# Patient Record
Sex: Female | Born: 1955 | Race: White | Hispanic: No | Marital: Married | Smoking: Never smoker
Health system: Southern US, Community
[De-identification: ages and names within clinical notes are randomized; demographics above are authoritative.]

## PROBLEM LIST (undated history)

## (undated) DIAGNOSIS — I509 Heart failure, unspecified: Secondary | ICD-10-CM

## (undated) DIAGNOSIS — M419 Scoliosis, unspecified: Secondary | ICD-10-CM

## (undated) DIAGNOSIS — F329 Major depressive disorder, single episode, unspecified: Secondary | ICD-10-CM

## (undated) DIAGNOSIS — Z9889 Other specified postprocedural states: Secondary | ICD-10-CM

## (undated) DIAGNOSIS — I10 Essential (primary) hypertension: Secondary | ICD-10-CM

## (undated) DIAGNOSIS — R011 Cardiac murmur, unspecified: Secondary | ICD-10-CM

## (undated) DIAGNOSIS — R112 Nausea with vomiting, unspecified: Secondary | ICD-10-CM

## (undated) DIAGNOSIS — F32A Depression, unspecified: Secondary | ICD-10-CM

## (undated) DIAGNOSIS — K219 Gastro-esophageal reflux disease without esophagitis: Secondary | ICD-10-CM

## (undated) DIAGNOSIS — M1990 Unspecified osteoarthritis, unspecified site: Secondary | ICD-10-CM

## (undated) HISTORY — DX: Major depressive disorder, single episode, unspecified: F32.9

## (undated) HISTORY — DX: Essential (primary) hypertension: I10

## (undated) HISTORY — PX: REVISION TOTAL HIP ARTHROPLASTY: SHX766

## (undated) HISTORY — PX: ABDOMINAL HYSTERECTOMY: SHX81

## (undated) HISTORY — DX: Gastro-esophageal reflux disease without esophagitis: K21.9

## (undated) HISTORY — DX: Depression, unspecified: F32.A

## (undated) HISTORY — PX: BREAST CYST ASPIRATION: SHX578

## (undated) HISTORY — PX: BREAST EXCISIONAL BIOPSY: SUR124

## (undated) HISTORY — PX: HIP FRACTURE SURGERY: SHX118

---

## 2000-10-13 HISTORY — PX: BACK SURGERY: SHX140

## 2005-01-20 ENCOUNTER — Ambulatory Visit: Payer: Self-pay | Admitting: Family Medicine

## 2005-12-26 ENCOUNTER — Ambulatory Visit: Payer: Self-pay | Admitting: Gastroenterology

## 2006-02-06 ENCOUNTER — Other Ambulatory Visit: Payer: Self-pay

## 2006-02-23 ENCOUNTER — Inpatient Hospital Stay: Payer: Self-pay | Admitting: Specialist

## 2006-08-12 ENCOUNTER — Ambulatory Visit: Payer: Self-pay | Admitting: Family Medicine

## 2006-10-04 ENCOUNTER — Ambulatory Visit: Payer: Self-pay | Admitting: Family Medicine

## 2007-12-06 ENCOUNTER — Ambulatory Visit: Payer: Self-pay | Admitting: Family Medicine

## 2008-06-16 ENCOUNTER — Ambulatory Visit: Payer: Self-pay | Admitting: Gastroenterology

## 2008-11-03 ENCOUNTER — Ambulatory Visit: Payer: Self-pay | Admitting: Family Medicine

## 2009-05-18 ENCOUNTER — Ambulatory Visit: Payer: Self-pay | Admitting: Family Medicine

## 2009-07-02 ENCOUNTER — Ambulatory Visit: Payer: Self-pay | Admitting: Family Medicine

## 2009-07-15 ENCOUNTER — Inpatient Hospital Stay: Payer: Self-pay | Admitting: General Practice

## 2009-07-15 HISTORY — PX: FEMUR FRACTURE SURGERY: SHX633

## 2010-05-26 ENCOUNTER — Ambulatory Visit: Payer: Self-pay | Admitting: Family Medicine

## 2011-05-02 LAB — HM COLONOSCOPY

## 2011-10-25 ENCOUNTER — Ambulatory Visit: Payer: Self-pay | Admitting: General Practice

## 2011-10-25 LAB — BASIC METABOLIC PANEL
Anion Gap: 13 (ref 7–16)
BUN: 19 mg/dL — ABNORMAL HIGH (ref 7–18)
Calcium, Total: 9.4 mg/dL (ref 8.5–10.1)
Chloride: 100 mmol/L (ref 98–107)
Co2: 29 mmol/L (ref 21–32)
Creatinine: 0.88 mg/dL (ref 0.60–1.30)
EGFR (African American): >60
EGFR (Non-African Amer.): >60
Glucose: 97 mg/dL (ref 65–99)
Osmolality: 285 (ref 275–301)
Potassium: 3.5 mmol/L (ref 3.5–5.1)
Sodium: 142 mmol/L (ref 136–145)

## 2011-10-25 LAB — CBC
HCT: 41.7 % (ref 35.0–47.0)
HGB: 13.5 g/dL (ref 12.0–16.0)
MCH: 27.4 pg (ref 26.0–34.0)
MCHC: 32.5 g/dL (ref 32.0–36.0)
MCV: 84 fL (ref 80–100)
Platelet: 202 10*3/uL (ref 150–440)
RBC: 4.95 10*6/uL (ref 3.80–5.20)
RDW: 15.2 % — ABNORMAL HIGH (ref 11.5–14.5)
WBC: 9.1 10*3/uL (ref 3.6–11.0)

## 2011-10-25 LAB — URINALYSIS, COMPLETE
Bilirubin,UR: NEGATIVE
Blood: NEGATIVE
Glucose,UR: NEGATIVE mg/dL (ref 0–75)
Granular Cast: 9
Ketone: NEGATIVE
Leukocyte Esterase: NEGATIVE
Nitrite: NEGATIVE
Ph: 5 (ref 4.5–8.0)
Protein: NEGATIVE
RBC,UR: 5 /HPF (ref 0–5)
Specific Gravity: 1.015 (ref 1.003–1.030)
Squamous Epithelial: 1
WBC UR: 10 /HPF (ref 0–5)

## 2011-10-25 LAB — MRSA PCR SCREENING

## 2011-10-25 LAB — APTT: Activated PTT: 24.1 secs (ref 23.6–35.9)

## 2011-10-25 LAB — PROTIME-INR
INR: 0.9
Prothrombin Time: 12.3 secs (ref 11.5–14.7)

## 2011-10-25 LAB — SEDIMENTATION RATE: Erythrocyte Sed Rate: 9 mm/hr (ref 0–30)

## 2011-10-27 LAB — URINE CULTURE

## 2011-11-07 ENCOUNTER — Inpatient Hospital Stay: Payer: Self-pay | Admitting: General Practice

## 2011-11-07 LAB — PLATELET COUNT: Platelet: 197 10*3/uL (ref 150–440)

## 2011-11-07 LAB — HEMOGLOBIN: HGB: 11.4 g/dL — ABNORMAL LOW (ref 12.0–16.0)

## 2011-11-08 LAB — BASIC METABOLIC PANEL
Anion Gap: 8 (ref 7–16)
BUN: 11 mg/dL (ref 7–18)
Calcium, Total: 7.8 mg/dL — ABNORMAL LOW (ref 8.5–10.1)
Chloride: 107 mmol/L (ref 98–107)
Co2: 28 mmol/L (ref 21–32)
Creatinine: 0.81 mg/dL (ref 0.60–1.30)
EGFR (African American): >60
EGFR (Non-African Amer.): >60
Glucose: 118 mg/dL — ABNORMAL HIGH (ref 65–99)
Osmolality: 285 (ref 275–301)
Potassium: 4.1 mmol/L (ref 3.5–5.1)
Sodium: 143 mmol/L (ref 136–145)

## 2011-11-08 LAB — PLATELET COUNT: Platelet: 152 10*3/uL (ref 150–440)

## 2011-11-08 LAB — PATHOLOGY REPORT

## 2011-11-08 LAB — HEMOGLOBIN: HGB: 9.6 g/dL — ABNORMAL LOW (ref 12.0–16.0)

## 2011-11-09 LAB — BASIC METABOLIC PANEL
Anion Gap: 8 (ref 7–16)
BUN: 8 mg/dL (ref 7–18)
Calcium, Total: 7.8 mg/dL — ABNORMAL LOW (ref 8.5–10.1)
Chloride: 106 mmol/L (ref 98–107)
Co2: 28 mmol/L (ref 21–32)
Creatinine: 0.68 mg/dL (ref 0.60–1.30)
EGFR (African American): >60
EGFR (Non-African Amer.): >60
Glucose: 96 mg/dL (ref 65–99)
Osmolality: 281 (ref 275–301)
Potassium: 3.5 mmol/L (ref 3.5–5.1)
Sodium: 142 mmol/L (ref 136–145)

## 2011-11-09 LAB — PLATELET COUNT: Platelet: 136 10*3/uL — ABNORMAL LOW (ref 150–440)

## 2011-11-09 LAB — HEMOGLOBIN: HGB: 9.2 g/dL — ABNORMAL LOW (ref 12.0–16.0)

## 2011-12-27 ENCOUNTER — Encounter: Payer: Self-pay | Admitting: General Practice

## 2012-01-14 ENCOUNTER — Encounter: Payer: Self-pay | Admitting: General Practice

## 2012-02-13 ENCOUNTER — Encounter: Payer: Self-pay | Admitting: General Practice

## 2012-08-23 ENCOUNTER — Ambulatory Visit: Payer: Self-pay | Admitting: Family Medicine

## 2014-05-01 ENCOUNTER — Ambulatory Visit: Payer: Self-pay | Admitting: Family Medicine

## 2014-09-10 LAB — HM PAP SMEAR: HM Pap smear: NORMAL

## 2014-11-18 ENCOUNTER — Ambulatory Visit
Admit: 2014-11-18 | Disposition: A | Discharge status: Home / Self Care | Payer: Self-pay | Attending: Family Medicine | Admitting: Family Medicine

## 2014-11-18 LAB — HM MAMMOGRAPHY

## 2014-12-07 NOTE — Discharge Summary (Signed)
PATIENT NAME:  Kristi Lee, Kristi Lee MR#:  [id number] DATE OF BIRTH:  1955-10-15  DATE OF ADMISSION:  11/07/2011 DATE OF DISCHARGE:  11/10/2011  ADMITTING DIAGNOSES:  1. 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 2. Retained hardware status post open reduction with internal fixation of right pertrochanteric femur fracture.   DISCHARGE DIAGNOSES:  1. 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 2. Retained hardware status post open reduction with internal fixation of right pertrochanteric femur fracture.   HISTORY: The patient is a 59 year old who has been followed at [facility] for progression of right hip pain. She had previously underwent ORIF of a right femur fracture approximately two years ago. A long trochanteric fixation nail was utilized at that time. She was noted to have degenerative changes to the right hip. She had noticed increase in her right groin pain as well as pain over the right trochanteric bursa region. She had noticed significant limitation with her range of motion. Her pain had progressed to the point that it was significantly interfering with her activities of daily living over the last several months and subsequently had gone to using a quad cane for ambulation. She did not see any significant improvement following cortisone injections to the trochanteric region. She had also felt that the pain had progressed to the point that it was significantly interfering with her activities of daily living. X-rays taken in the clinic showed a trochanteric fixation nail to the right femur and was in good position. There was no cut through noted. The fracture site appeared to be well healed. There was significant narrowing of the cartilage space, of the right hip, with evidence of protrusio noted. There was  irregularity of the femoral head with asymmetry being noted. After discussion of the risks and benefits of surgical intervention, the patient  expressed her understanding of the risks and benefits and agreed with plans for surgical intervention.   PROCEDURE:  1. Removal of retained hardware from right hip. 2. Right total hip arthroplasty with bone grafting of acetabular protrusio defect.   ANESTHESIA: General.   ESTIMATED BLOOD LOSS: 1300 mL.  FLUID REPLACED: 4000 mL of crystalloid and 2 units of packed RBC cells.   IMPLANTS UTILIZED: DePuy 15 mm small stature Prodigy femoral component, 54 mm outer diameter Pinnacle Gription sector acetabular component, two 6.5 mm cancellus screws, a Pinnacle Marathon polyethylene acetabular liner with a +4 neutral alignment, and a 36 mm M-Spec femoral head with a +1.5 mm neck length, 20 mL of cancellus graft was utilized with similar amount from the femoral head.   HOSPITAL COURSE: The patient tolerated the procedure very well. She had no complications. She was then taken to the PAC-U where she was stabilized and then transferred to the orthopedic floor. She began receiving anticoagulation therapy of Lovenox 30 mg subcutaneous every 12 hours per anesthesia protocol. She was fitted with TED stockings bilaterally. These were allowed to be removed one hour per eight hour shift. She was also fitted with the AV-I compression foot pumps bilaterally set at 80 mmHg. Her calves have been nontender and free of any evidence of any deep venous thromboses. Negative Homans sign. The heels were elevated off the bed using rolled towels. There was no tissue breakdown noted. Throughout the hospital course, the head of the bed was gatched as well as the knees. She pretty much stayed in a fetal position. We did discuss with the patient slowly stretching both of these out to stretch the hip flexors. Prior  to surgery, she was noted to have severe hip flexion contractures and have I have discussed this with the patient as far as slowly stretching these out over the next several months.   The patient has denied any chest pain or  shortness of breath. Vital signs have been stable. She has been afebrile. Hemodynamically she was stable. Following the 2 units of transfusion in the OR her hemoglobin was 9.6 on day one and 9.2 on day two. She was asymptomatic. She was taking in fluids. She had no symptoms of volume depletion and no headaches, dizziness, or shortness of breath.   The patient began receiving physical therapy for gait training and transfers on day one. She has done extremely well. By day two she was ambulating greater than 200 feet. She was able go up and down four sets of steps. She was independent with bed to chair transfers. Upon leaving she was noted to have significant improvement with her hip flexion, but still had some. Occupational therapy was also initiated on day one for activities of daily living and assistive devices.   The patient's IV, Foley, and Hemovac were all discontinued on day two along with a dressing change. She has had no signs of any infection. The wound is looking good and no drainage was noted.   DISPOSITION: The patient is being discharged to home in improved stable condition.   DISCHARGE INSTRUCTIONS:  1. She may weight bear as tolerated. Continue using a walker until cleared by physical therapy to go to a quad cane.  2. She is to continue wearing the stockings. These are to be worn during the day but may be removed at night as long as she continues to be active. However, if she is sedentary she is to wear these around-the-clock.  3. She is not to take a shower until the staples are removed in two weeks.  4. She will receive home health physical therapy.  5. A therapist will remove the staples in two weeks. They will change dressing as needed.  6. She has a follow-up appointment in six weeks, sooner for any temperatures of 101.5 or greater or excessive bleeding.  7. She is placed on a regular diet.  8. She is to resume her regular medication that she was on prior to admission. She was given  a prescription for Roxicodone 5 to 10 mg every 4 to 6 hours p.r.n. and Ultram 1 to 2 tablets every 4 to 6 hours p.r.n. A third prescription for Lovenox 40 mg subcutaneously daily for 14 days was given. She is to begin taking aspirin after finishing the Lovenox.   PAST MEDICAL HISTORY:  1. Hypertension.  2. Shingles.  3. Acid reflux. 4. Chickenpox. ____________________________ Vance Peper, PA [id number] D: 11/10/2011 07:22:05 ET T: 11/10/2011 14:34:27 ET JOB#: [id number]  cc: Vance Peper, PA, <Dictator> Jemila Camille PA ELECTRONICALLY SIGNED 11/13/2011 20:22

## 2014-12-07 NOTE — Op Note (Signed)
PATIENT NAME:  Kristi Lee, Kristi Lee MR#:  [id number] DATE OF BIRTH:  1955-11-30  DATE OF PROCEDURE:  11/07/2011  PREOPERATIVE DIAGNOSES:  1. Degenerative arthrosis of the right hip with acetabular protrusio. 2. Retained hardware status post open reduction internal fixation of right pertrochanteric femur fracture.   POSTOPERATIVE DIAGNOSES: 1. Degenerative arthrosis of the right hip with acetabular protrusio. 2. Retained hardware status post open reduction internal fixation of right pertrochanteric femur fracture.   PROCEDURES PERFORMED:  1. Removal of retained hardware from the right hip.  2. Right total hip arthroplasty with bone grafting of the acetabular protrusio defect.   SURGEON: Laurice Record. Holley Bouche., MD   ASSISTANT Vance Peper, PA-C (required to maintain retraction throughout the procedure)   ANESTHESIA: General.   ESTIMATED BLOOD LOSS: 1300 mL.   FLUIDS REPLACED: 4000 mL of Crystalloid and 2 units of packed red blood cells.   DRAINS: Two medium drains to Hemovac reservoir.   IMPLANTS UTILIZED: DePuy 15 mm small stature Prodigy femoral component, 54 mm outer diameter Pinnacle Gription Sector acetabular component, two 6.5 mm cancellus screws, a Pinnacle Marathon polyethylene acetabular liner with +4 mm neutral alignment, and a 36 mm M-Spec femoral head with a +1.5 mm neck length. 20 mL of cancellous graft was utilized with similar amount from the femoral head.   INDICATIONS FOR SURGERY: The patient is a 59 year old female who previously underwent open reduction internal fixation of right pertrochanteric femur fracture. She was noted to have degenerative changes at that time. The patient has had increase in right hip pain as well as limited range of motion. X-rays demonstrated severe degenerative changes with protrusio defect. After discussion of the risks and benefits of surgical intervention, the patient expressed her understanding of the risks and benefits and agreed with plans for  surgical intervention.   PROCEDURE IN DETAIL: The patient was brought into from the operating room and, after adequate general anesthesia was achieved, the patient was placed in a left lateral decubitus position. Axillary roll was placed and all bony prominences were well padded. The patient's right hip and leg were cleaned and prepped with alcohol and DuraPrep and draped in the usual sterile fashion. A "time-out" was performed as per usual protocol. Knee was placed through a range of motion and was noted be extremely limited with regards to abduction or rotation. The patient had approximately 40 degree hip flexion contracture. A stab incision was made in line with the previous surgical scar over the distal aspect of the femur. The distal locking screw was identified and was removed without difficulty. Next, a lateral curvilinear incision was made gently curving towards the posterior superior iliac spine. IT band was incised in line with the skin incision and the fibers of the gluteus maximus were split in line. There was a small rent in the IT band associated with the proximal tip of the trochanteric fixation nail. The piriformis tendon was identified, skeletonized, and incised at its insertion to the proximal femur and reflected posteriorly. In a similar fashion, short external rotators were incised and reflected posteriorly. A small incision was made over the fascia of the vastus lateralis so as to visualize the helical blade. Locking mechanism was released proximally and the helical blade was removed without difficulty. Next, extraction device was attached to the proximal portion of the trochanteric fixation nail and the nail was carefully tapped out. Next, the femoral head was dislocated posteriorly. Severe degenerative changes were noted with relatively oblong shape noted. Femoral neck cut was performed  using an oscillating saw. Anterior capsule was elevated off of the femoral neck. Inspection of the  acetabulum demonstrated oblong defect consistent with medial and superior migration of the femoral head causing some protrusio. Remnant of the labrum was excised. The acetabulum was then carefully reamed in a sequential fashion up to a 53 mm diameter. A total of 20 mL of cancellous graft was mixed with similar amount of graft obtained from reamings of the femoral head. The grafting material was impacted and a 50 reamer was placed on reverse and the graft was impacted accordingly. A 54 mm Pinnacle Gription Sector cup was positioned and impacted into place. Good scratch fit was appreciated. Two 6.5 mm cancellus screws were inserted through the dome holes for additional fixation. A +4 mm neutral polyethylene trial was inserted and attention was directed to the proximal femur. Pilot hole for reaming of the proximal femoral canal was created using a high-speed bur. The proximal femoral canal was reamed in a sequential fashion up to a 14.5 mm diameter. This allowed for approximately 5.5 to 6 cm of scratch fit. Proximal portion of the femur was prepared using a 50 mm aggressive side-biting reamer. Serial broaches were inserted up to a 15 mm small stature broach. Calcar region was planed accordingly and trial reduction was performed using a 36 mm hip ball with a 1.5 mm neck length. Hip was felt to be tight and the broach was countersunk and calcar region again planed. Trial reduction was again performed with AML neck segment. Fair stability was appreciated. It was elected to trial the Prodigy neck segment with a +1.5 mm neck length. This allowed for improved stability. Trial components were removed. The acetabular shell was irrigated and suctioned dry. A +4 mm neutral Pinnacle Marathon polyethylene liner was positioned and impacted in place. Next, a 15 mm small stature Prodigy component was positioned and impacted in place. Good scratch fit was appreciated. Trial reduction was again performed with a +1.5 mm neck length.  Again, excellent stability was appreciated. It should be noted that both the hip flexors and abductors were tight but consistent with preoperative range of motion. Trial hip ball was removed. The Morse taper was cleaned and dried. A 36 mm M-Spec femoral head with a +1.5 mm neck length was placed on the trunnion and impacted in place. The hip was then reduced and placed through a range of motion.   The wound was irrigated with copious amounts of normal saline with antibiotic solution using pulsatile lavage and then suctioned dry. Posterior capsulotomy was repaired using #5 Ethibond. Gluteal sling was repaired using #5 Ethibond. Two medium drains were placed in the wound bed and brought out through a separate stab incision to be attached to Hemovac reservoir. The IT band was repaired using interrupted sutures of #1 Vicryl. Subcutaneous tissue was approximated in layers using first #0 Vicryl followed by #2-0 Vicryl. Skin was closed with skin staples. Sterile dressing was applied.   The patient tolerated the procedure well. She was transported to the recovery room in stable condition.   ____________________________ Laurice Record. Holley Bouche., MD jph:drc D: 11/07/2011 22:43:06 ET T: 11/08/2011 10:37:43 ET JOB#: [id number]  cc: Laurice Record. Holley Bouche., MD, <Dictator> Laurice Record Holley Bouche MD ELECTRONICALLY SIGNED 11/09/2011 15:53

## 2015-01-27 ENCOUNTER — Other Ambulatory Visit: Payer: Self-pay

## 2015-01-27 DIAGNOSIS — M549 Dorsalgia, unspecified: Principal | ICD-10-CM

## 2015-01-27 DIAGNOSIS — G8929 Other chronic pain: Secondary | ICD-10-CM | POA: Insufficient documentation

## 2015-01-27 DIAGNOSIS — I10 Essential (primary) hypertension: Secondary | ICD-10-CM | POA: Insufficient documentation

## 2015-01-27 DIAGNOSIS — F4320 Adjustment disorder, unspecified: Secondary | ICD-10-CM | POA: Insufficient documentation

## 2015-01-27 DIAGNOSIS — R945 Abnormal results of liver function studies: Secondary | ICD-10-CM | POA: Insufficient documentation

## 2015-01-27 DIAGNOSIS — R350 Frequency of micturition: Secondary | ICD-10-CM | POA: Insufficient documentation

## 2015-01-27 DIAGNOSIS — K5900 Constipation, unspecified: Secondary | ICD-10-CM | POA: Insufficient documentation

## 2015-01-27 DIAGNOSIS — Z8739 Personal history of other diseases of the musculoskeletal system and connective tissue: Secondary | ICD-10-CM | POA: Insufficient documentation

## 2015-01-27 DIAGNOSIS — G4700 Insomnia, unspecified: Secondary | ICD-10-CM | POA: Insufficient documentation

## 2015-01-27 DIAGNOSIS — K219 Gastro-esophageal reflux disease without esophagitis: Secondary | ICD-10-CM | POA: Insufficient documentation

## 2015-01-27 DIAGNOSIS — M419 Scoliosis, unspecified: Secondary | ICD-10-CM | POA: Insufficient documentation

## 2015-01-27 DIAGNOSIS — R7989 Other specified abnormal findings of blood chemistry: Secondary | ICD-10-CM | POA: Insufficient documentation

## 2015-01-27 MED ORDER — CELECOXIB 200 MG PO CAPS: 200.0000 mg | ORAL_CAPSULE | Freq: Every day | ORAL | Status: DC

## 2015-04-27 ENCOUNTER — Encounter: Payer: Self-pay | Admitting: Family Medicine

## 2015-04-27 ENCOUNTER — Ambulatory Visit (INDEPENDENT_AMBULATORY_CARE_PROVIDER_SITE_OTHER): Payer: 59 | Admitting: Family Medicine

## 2015-04-27 VITALS — BP 126/86 | HR 83 | Temp 98.9°F | Resp 16 | Wt 175.6 lb

## 2015-04-27 DIAGNOSIS — L03116 Cellulitis of left lower limb: Secondary | ICD-10-CM

## 2015-04-27 MED ORDER — AMOXICILLIN-POT CLAVULANATE 875-125 MG PO TABS: 1.0000 | ORAL_TABLET | Freq: Two times a day (BID) | ORAL | Status: DC

## 2015-04-27 NOTE — Progress Notes (Signed)
Subjective:     Patient ID: Kristi Lee, female   DOB: 07-16-1956, 59 y.o.   MRN: [id number]  HPI  Chief Complaint  Patient presents with  . Skin Problem    Patient comes in office today for skin check, she reports that on Saturday 9/10 she had redness and pain around her left leg. Patient described skin as warm to the touch, she denies history of cellulitis.   States she did shave her legs prior to the onset of her sx. No hx of MRSA.   Review of Systems  Constitutional: Negative for fever and chills.       Objective:   Physical Exam  Constitutional: She appears well-developed and well-nourished. No distress.  Cardiovascular:  Pulses:      Dorsalis pedis pulses are 2+ on the left side.       Posterior tibial pulses are 2+ on the left side.  Abdominal: Bowel sounds are normal.  Musculoskeletal: She exhibits edema (1+).  Skin: Rash (disal left lower extremity anterior aspect) noted. There is erythema (and increased warmth. Small abrasion noted).       Assessment:    1. Cellulitis of left lower extremity - amoxicillin-clavulanate (AUGMENTIN) 875-125 MG per tablet; Take 1 tablet by mouth 2 (two) times daily.  Dispense: 20 tablet; Refill: 0    Plan:    discussed use of warm compresses and leg elevation

## 2015-04-27 NOTE — Patient Instructions (Signed)
Discussed leg elevation and warm compresses. If not improving in 48 hours (decreased area of redness, pain and warmth)

## 2015-04-30 ENCOUNTER — Telehealth: Payer: Self-pay | Admitting: Family Medicine

## 2015-04-30 NOTE — Telephone Encounter (Signed)
Pt stated that when she came for the appointment on Monday, she was told to take the antbx for 48 hrs and if it got better stop taking it. She said that the cellulitis is getting better that it is still a little red and a little swollen, but better. She wants to know if to continue with the medication or to stop it and or if she needs to be seen again. She was advised to continue with the medication until further notice.   Please advise,  Thanks,

## 2015-04-30 NOTE — Telephone Encounter (Signed)
Patient has been advised. KW 

## 2015-04-30 NOTE — Telephone Encounter (Signed)
Continue the antibiotic until done. The instructions were to return to the office if NOT improving in 48 hours

## 2015-04-30 NOTE — Telephone Encounter (Signed)
Pt called saying she the cellulitis was looking better, still a little swollen and red but she can see an improvement, didn't know whether she needed to come back in or not.  Thanks Con Memos

## 2015-05-26 ENCOUNTER — Other Ambulatory Visit: Payer: Self-pay

## 2015-05-27 ENCOUNTER — Other Ambulatory Visit: Payer: Self-pay

## 2015-05-27 DIAGNOSIS — K219 Gastro-esophageal reflux disease without esophagitis: Secondary | ICD-10-CM

## 2015-05-27 MED ORDER — OMEPRAZOLE 20 MG PO CPDR: 20.0000 mg | DELAYED_RELEASE_CAPSULE | Freq: Every day | ORAL | Status: DC

## 2015-06-11 ENCOUNTER — Other Ambulatory Visit: Payer: Self-pay

## 2015-06-11 DIAGNOSIS — G4700 Insomnia, unspecified: Secondary | ICD-10-CM

## 2015-06-11 NOTE — Telephone Encounter (Signed)
Pharmacy requesting 90 day supply of medication. Patient's last OV was 04/27/15.

## 2015-06-16 ENCOUNTER — Other Ambulatory Visit: Payer: Self-pay

## 2015-06-16 DIAGNOSIS — G4700 Insomnia, unspecified: Secondary | ICD-10-CM

## 2015-06-16 MED ORDER — TRAZODONE HCL 100 MG PO TABS: 100.0000 mg | ORAL_TABLET | Freq: Every day | ORAL | Status: DC

## 2015-06-30 ENCOUNTER — Other Ambulatory Visit: Payer: Self-pay

## 2015-06-30 DIAGNOSIS — R350 Frequency of micturition: Secondary | ICD-10-CM

## 2015-06-30 MED ORDER — TOLTERODINE TARTRATE 2 MG PO TABS: 2.0000 mg | ORAL_TABLET | Freq: Two times a day (BID) | ORAL | Status: DC

## 2015-07-07 ENCOUNTER — Other Ambulatory Visit: Payer: Self-pay | Admitting: Family Medicine

## 2015-07-07 DIAGNOSIS — F4323 Adjustment disorder with mixed anxiety and depressed mood: Secondary | ICD-10-CM

## 2015-07-07 DIAGNOSIS — I10 Essential (primary) hypertension: Secondary | ICD-10-CM

## 2015-07-14 ENCOUNTER — Other Ambulatory Visit: Payer: Self-pay

## 2015-07-14 DIAGNOSIS — G4700 Insomnia, unspecified: Secondary | ICD-10-CM

## 2015-07-14 DIAGNOSIS — I10 Essential (primary) hypertension: Secondary | ICD-10-CM

## 2015-07-14 MED ORDER — TRIAMTERENE-HCTZ 37.5-25 MG PO TABS: 1.0000 | ORAL_TABLET | Freq: Every day | ORAL | Status: DC

## 2015-07-14 NOTE — Telephone Encounter (Signed)
Pharmacy requesting refill. Medication was last filled on 07/22/2014. Patient was last seen by you on 09/10/14. However, she has been seen by Mikki Santee for acute visits. Last was 04/27/15 for cellulitis.

## 2015-08-12 ENCOUNTER — Telehealth: Payer: Self-pay

## 2015-08-12 NOTE — Telephone Encounter (Signed)
Patient would need ov to document need and which one she would want. Not sure if insurance will cover. Thanks.

## 2015-08-12 NOTE — Telephone Encounter (Signed)
LM for pt to call back.  Per Dr. Venia Minks pt needs to schedule an ov for prescription refill. sd

## 2015-08-12 NOTE — Telephone Encounter (Signed)
Pharmacy requesting refill on medications not on medication list.

## 2015-08-21 ENCOUNTER — Other Ambulatory Visit: Payer: Self-pay

## 2015-10-06 ENCOUNTER — Ambulatory Visit (INDEPENDENT_AMBULATORY_CARE_PROVIDER_SITE_OTHER): Payer: 59 | Admitting: Family Medicine

## 2015-10-06 ENCOUNTER — Encounter: Payer: Self-pay | Admitting: Family Medicine

## 2015-10-06 VITALS — BP 120/76 | HR 72 | Temp 98.2°F | Resp 16 | Ht 65.0 in | Wt 177.0 lb

## 2015-10-06 DIAGNOSIS — E7800 Pure hypercholesterolemia, unspecified: Secondary | ICD-10-CM | POA: Diagnosis not present

## 2015-10-06 DIAGNOSIS — R350 Frequency of micturition: Secondary | ICD-10-CM

## 2015-10-06 DIAGNOSIS — Z1239 Encounter for other screening for malignant neoplasm of breast: Secondary | ICD-10-CM

## 2015-10-06 DIAGNOSIS — I10 Essential (primary) hypertension: Secondary | ICD-10-CM | POA: Diagnosis not present

## 2015-10-06 DIAGNOSIS — G8929 Other chronic pain: Secondary | ICD-10-CM | POA: Diagnosis not present

## 2015-10-06 DIAGNOSIS — M549 Dorsalgia, unspecified: Secondary | ICD-10-CM | POA: Diagnosis not present

## 2015-10-06 DIAGNOSIS — Z0000 Encounter for general adult medical examination without abnormal findings: Secondary | ICD-10-CM

## 2015-10-06 LAB — POCT URINALYSIS DIPSTICK
Bilirubin, UA: NEGATIVE
Blood, UA: NEGATIVE
Glucose, UA: NEGATIVE
Ketones, UA: NEGATIVE
Leukocytes, UA: NEGATIVE
Nitrite, UA: NEGATIVE
Protein, UA: NEGATIVE
Spec Grav, UA: 1.02
Urobilinogen, UA: 0.2
pH, UA: 6

## 2015-10-06 MED ORDER — TOLTERODINE TARTRATE 2 MG PO TABS: 2.0000 mg | ORAL_TABLET | Freq: Two times a day (BID) | ORAL | Status: DC

## 2015-10-06 MED ORDER — CELECOXIB 200 MG PO CAPS: 200.0000 mg | ORAL_CAPSULE | Freq: Every day | ORAL | Status: DC

## 2015-10-06 NOTE — Progress Notes (Signed)
Patient ID: Kristi Lee, female   DOB: May 28, 1956, 60 y.o.   MRN: [id number]       Patient: Kristi Lee, Female    DOB: Mar 13, 1956, 60 y.o.   MRN: [id number] Visit Date: 10/06/2015  Today's Provider: Margarita Rana, MD   Chief Complaint  Patient presents with  . Annual Exam   Subjective:    Annual physical exam Kristi Lee is a 60 y.o. female who presents today for health maintenance and complete physical. She feels well. She reports exercising 2 days a week. She reports she is sleeping well. 09/10/14 CPE 09/10/14 Pap-neg 11/18/14 Mammo-BI-RADS 1 05/02/11 Colon-int hemorrhoid, recheck in 5-10 yrs -----------------------------------------------------------------   Review of Systems  Constitutional: Negative.   HENT: Negative.   Eyes: Negative.   Respiratory: Negative.   Cardiovascular: Negative.   Gastrointestinal: Negative.   Endocrine: Negative.   Genitourinary: Negative.   Musculoskeletal: Negative.   Skin: Negative.   Allergic/Immunologic: Negative.   Neurological: Negative.   Hematological: Negative.   Psychiatric/Behavioral: Negative.     Social History      She  reports that she has never smoked. She has never used smokeless tobacco. She reports that she does not drink alcohol or use illicit drugs.       Social History   Social History  . Marital Status: Married    Spouse Name: N/A  . Number of Children: 1  . Years of Education: HS   Occupational History  . 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 Smoking status: Never Smoker   . Smokeless tobacco: Never Used  . Alcohol Use: No  . Drug Use: No  . Sexual Activity: Not Asked   Other Topics Concern  . None   Social History Narrative    Past Medical History  Diagnosis Date  . Hypertension   . GERD (gastroesophageal reflux disease)   . Depression      Patient Active Problem List   Diagnosis Date Noted  . Adaptation reaction 01/27/2015  . Back  pain, chronic 01/27/2015  . CN (constipation) 01/27/2015  . Abnormal LFTs 01/27/2015  . Essential (primary) hypertension 01/27/2015  . Gastro-esophageal reflux disease without esophagitis 01/27/2015  . Personal history of arthritis 01/27/2015  . Insomnia 01/27/2015  . Scoliosis 01/27/2015  . FOM (frequency of micturition) 01/27/2015    Past Surgical History  Procedure Laterality Date  . Femur fracture surgery Right 07/15/2009    Pertrochanteric femur fracture.  . Back surgery  10/2000  . Revision total hip arthroplasty Left   . Breast biopsy    . Hip fracture surgery Right     Family History        Family Status  Relation Status Death Age  . Mother Alive   . Father Deceased   . Sister Alive   . Brother Alive   . Brother Alive         Her family history includes Diabetes in her brother; Lung cancer in her father; Osteoarthritis in her mother.    No Known Allergies  Previous Medications   ATENOLOL (TENORMIN) 25 MG TABLET    Take 1 tablet by mouth  daily   CELECOXIB (CELEBREX) 200 MG CAPSULE    Take 1 capsule (200 mg total) by mouth daily.   OMEPRAZOLE (PRILOSEC) 20 MG CAPSULE    Take 1 capsule (20 mg total) by mouth daily.   TOLTERODINE (DETROL) 2 MG TABLET    Take 1 tablet (2 mg total) by mouth  2 (two) times daily.   TRAZODONE (DESYREL) 100 MG TABLET    Take 1 tablet (100 mg total) by mouth at bedtime.   TRIAMTERENE-HYDROCHLOROTHIAZIDE (MAXZIDE-25) 37.5-25 MG TABLET    Take 1 tablet by mouth daily.   VENLAFAXINE XR (EFFEXOR-XR) 75 MG 24 HR CAPSULE    Take 1 capsule by mouth  daily    Patient Care Team: Margarita Rana, MD as PCP - General (Family Medicine)     Objective:   Vitals: BP 120/76 mmHg  Pulse 72  Temp(Src) 98.2 F (36.8 C) (Oral)  Resp 16  Ht 5\' 5"  (1.651 m)  Wt 177 lb (80.287 kg)  BMI 29.45 kg/m2  SpO2 97%   Physical Exam  Constitutional: She is oriented to person, place, and time. She appears well-developed and well-nourished.  HENT:  Head:  Normocephalic and atraumatic.  Right Ear: Tympanic membrane, external ear and ear canal normal.  Left Ear: Tympanic membrane, external ear and ear canal normal.  Nose: Nose normal.  Mouth/Throat: Uvula is midline, oropharynx is clear and moist and mucous membranes are normal.  Eyes: Conjunctivae, EOM and lids are normal. Pupils are equal, round, and reactive to light.  Neck: Trachea normal and normal range of motion. Neck supple. Carotid bruit is not present. No thyroid mass and no thyromegaly present.  Cardiovascular: Normal rate, regular rhythm and normal heart sounds.   Pulmonary/Chest: Effort normal and breath sounds normal.  Abdominal: Soft. Normal appearance and bowel sounds are normal. There is no hepatosplenomegaly. There is no tenderness.  Genitourinary: No breast swelling, tenderness or discharge.  Musculoskeletal: Normal range of motion.  Lymphadenopathy:    She has no cervical adenopathy.    She has no axillary adenopathy.  Neurological: She is alert and oriented to person, place, and time. She has normal strength. No cranial nerve deficit.  Skin: Skin is warm, dry and intact.  Psychiatric: She has a normal mood and affect. Her speech is normal and behavior is normal. Judgment and thought content normal. Cognition and memory are normal.     Depression Screen PHQ 2/9 Scores 10/06/2015  PHQ - 2 Score 0      Assessment & Plan:     Routine Health Maintenance and Physical Exam  Exercise Activities and Dietary recommendations Goals    None      Immunization History  Administered Date(s) Administered  . Tdap 08/01/2012        1. Annual physical exam Stable. Patient advised to continue eating healthy and exercise daily. - POCT urinalysis dipstick  2. Breast cancer screening - MM DIGITAL SCREENING BILATERAL; Future  3. Essential (primary) hypertension - CBC with Differential/Platelet - Comprehensive metabolic panel  4. Hypercholesteremia - Lipid Panel With  LDL/HDL Ratio - TSH  5. Back pain, chronic - celecoxib (CELEBREX) 200 MG capsule; Take 1 capsule (200 mg total) by mouth daily.  Dispense: 90 capsule; Refill: 3  6. FOM (frequency of micturition) - tolterodine (DETROL) 2 MG tablet; Take 1 tablet (2 mg total) by mouth 2 (two) times daily.  Dispense: 180 tablet; Refill: 2    Patient seen and examined by Dr. Jerrell Belfast, and note scribed by Philbert Riser. Dimas, CMA.  I have reviewed the document for accuracy and completeness and I agree with above. Jerrell Belfast, MD   Margarita Rana, MD    --------------------------------------------------------------------

## 2015-10-14 LAB — CBC WITH DIFFERENTIAL/PLATELET
Basophils Absolute: 0.1 10*3/uL (ref 0.0–0.2)
Basos: 1 %
EOS (ABSOLUTE): 0.2 10*3/uL (ref 0.0–0.4)
Eos: 2 %
Hematocrit: 40.7 % (ref 34.0–46.6)
Hemoglobin: 13.5 g/dL (ref 11.1–15.9)
Immature Grans (Abs): 0 10*3/uL (ref 0.0–0.1)
Immature Granulocytes: 0 %
Lymphocytes Absolute: 1.9 10*3/uL (ref 0.7–3.1)
Lymphs: 24 %
MCH: 26.5 pg — ABNORMAL LOW (ref 26.6–33.0)
MCHC: 33.2 g/dL (ref 31.5–35.7)
MCV: 80 fL (ref 79–97)
Monocytes Absolute: 1 10*3/uL — ABNORMAL HIGH (ref 0.1–0.9)
Monocytes: 13 %
Neutrophils Absolute: 4.8 10*3/uL (ref 1.4–7.0)
Neutrophils: 60 %
Platelets: 203 10*3/uL (ref 150–379)
RBC: 5.09 x10E6/uL (ref 3.77–5.28)
RDW: 16.2 % — ABNORMAL HIGH (ref 12.3–15.4)
WBC: 7.9 10*3/uL (ref 3.4–10.8)

## 2015-10-14 LAB — LIPID PANEL WITH LDL/HDL RATIO
Cholesterol, Total: 186 mg/dL (ref 100–199)
HDL: 64 mg/dL (ref >39)
LDL Calculated: 101 mg/dL — ABNORMAL HIGH (ref 0–99)
LDl/HDL Ratio: 1.6 ratio units (ref 0.0–3.2)
Triglycerides: 107 mg/dL (ref 0–149)
VLDL Cholesterol Cal: 21 mg/dL (ref 5–40)

## 2015-10-14 LAB — COMPREHENSIVE METABOLIC PANEL
ALT: 22 IU/L (ref 0–32)
AST: 18 IU/L (ref 0–40)
Albumin/Globulin Ratio: 1.8 (ref 1.1–2.5)
Albumin: 4.3 g/dL (ref 3.6–4.8)
Alkaline Phosphatase: 132 IU/L — ABNORMAL HIGH (ref 39–117)
BUN/Creatinine Ratio: 20 (ref 11–26)
BUN: 19 mg/dL (ref 8–27)
Bilirubin Total: 0.4 mg/dL (ref 0.0–1.2)
CO2: 27 mmol/L (ref 18–29)
Calcium: 9.5 mg/dL (ref 8.7–10.3)
Chloride: 102 mmol/L (ref 96–106)
Creatinine, Ser: 0.94 mg/dL (ref 0.57–1.00)
GFR calc Af Amer: 76 mL/min/{1.73_m2} (ref >59)
GFR calc non Af Amer: 66 mL/min/{1.73_m2} (ref >59)
Globulin, Total: 2.4 g/dL (ref 1.5–4.5)
Glucose: 89 mg/dL (ref 65–99)
Potassium: 3.8 mmol/L (ref 3.5–5.2)
Sodium: 146 mmol/L — ABNORMAL HIGH (ref 134–144)
Total Protein: 6.7 g/dL (ref 6.0–8.5)

## 2015-10-14 LAB — TSH: TSH: 1.95 u[IU]/mL (ref 0.450–4.500)

## 2015-10-16 ENCOUNTER — Telehealth: Payer: Self-pay

## 2015-10-16 NOTE — Telephone Encounter (Signed)
Pt advised as directed below.   Thanks,   -Laurissa Cowper  

## 2015-10-16 NOTE — Telephone Encounter (Signed)
-----   Message from Margarita Rana, MD sent at 10/15/2015  8:24 PM EST ----- Labs stable. Please notify patient. Thanks.

## 2015-10-19 ENCOUNTER — Other Ambulatory Visit: Payer: Self-pay | Admitting: Family Medicine

## 2015-10-19 DIAGNOSIS — G4700 Insomnia, unspecified: Secondary | ICD-10-CM

## 2015-11-19 ENCOUNTER — Ambulatory Visit
Admission: RE | Admit: 2015-11-19 | Discharge: 2015-11-19 | Disposition: A | Discharge status: Home / Self Care | Payer: 59 | Source: Ambulatory Visit | Attending: Family Medicine | Admitting: Family Medicine

## 2015-11-19 DIAGNOSIS — Z1231 Encounter for screening mammogram for malignant neoplasm of breast: Secondary | ICD-10-CM | POA: Diagnosis present

## 2015-11-19 DIAGNOSIS — Z1239 Encounter for other screening for malignant neoplasm of breast: Secondary | ICD-10-CM

## 2016-03-01 ENCOUNTER — Other Ambulatory Visit: Payer: Self-pay | Admitting: Family Medicine

## 2016-03-21 ENCOUNTER — Other Ambulatory Visit: Payer: Self-pay | Admitting: Family Medicine

## 2016-03-21 DIAGNOSIS — G4700 Insomnia, unspecified: Secondary | ICD-10-CM

## 2016-05-23 ENCOUNTER — Other Ambulatory Visit: Payer: Self-pay | Admitting: Family Medicine

## 2016-05-23 DIAGNOSIS — K219 Gastro-esophageal reflux disease without esophagitis: Secondary | ICD-10-CM

## 2016-06-21 ENCOUNTER — Other Ambulatory Visit: Payer: Self-pay | Admitting: Family Medicine

## 2016-06-21 DIAGNOSIS — I10 Essential (primary) hypertension: Secondary | ICD-10-CM

## 2016-07-29 ENCOUNTER — Other Ambulatory Visit: Payer: Self-pay | Admitting: Family Medicine

## 2016-07-29 DIAGNOSIS — F4323 Adjustment disorder with mixed anxiety and depressed mood: Secondary | ICD-10-CM

## 2016-09-19 ENCOUNTER — Other Ambulatory Visit: Payer: Self-pay

## 2016-09-19 DIAGNOSIS — I10 Essential (primary) hypertension: Secondary | ICD-10-CM

## 2016-09-19 MED ORDER — TRIAMTERENE-HCTZ 37.5-25 MG PO TABS: 1.0000 | ORAL_TABLET | Freq: Every day | ORAL | 3 refills | Status: DC

## 2016-09-19 NOTE — Telephone Encounter (Signed)
Pharmacy requesting refill Last ov 10/06/15 Last filled 07/14/15 Please review. Thank you. sd

## 2016-10-05 ENCOUNTER — Encounter: Payer: Self-pay | Admitting: *Deleted

## 2016-10-05 ENCOUNTER — Emergency Department: Payer: 59

## 2016-10-05 ENCOUNTER — Emergency Department
Admission: EM | Admit: 2016-10-05 | Discharge: 2016-10-05 | Disposition: A | Discharge status: Home / Self Care | Payer: 59 | Attending: Emergency Medicine | Admitting: Emergency Medicine

## 2016-10-05 DIAGNOSIS — R079 Chest pain, unspecified: Secondary | ICD-10-CM | POA: Insufficient documentation

## 2016-10-05 LAB — CBC
HCT: 37.2 % (ref 35.0–47.0)
Hemoglobin: 12.9 g/dL (ref 12.0–16.0)
MCH: 27.7 pg (ref 26.0–34.0)
MCHC: 34.7 g/dL (ref 32.0–36.0)
MCV: 79.9 fL — ABNORMAL LOW (ref 80.0–100.0)
Platelets: 213 10*3/uL (ref 150–440)
RBC: 4.65 MIL/uL (ref 3.80–5.20)
RDW: 16.1 % — ABNORMAL HIGH (ref 11.5–14.5)
WBC: 9.9 10*3/uL (ref 3.6–11.0)

## 2016-10-05 LAB — BASIC METABOLIC PANEL
Anion gap: 8 (ref 5–15)
BUN: 22 mg/dL — ABNORMAL HIGH (ref 6–20)
CO2: 28 mmol/L (ref 22–32)
Calcium: 9 mg/dL (ref 8.9–10.3)
Chloride: 103 mmol/L (ref 101–111)
Creatinine, Ser: 1.13 mg/dL — ABNORMAL HIGH (ref 0.44–1.00)
GFR calc Af Amer: 60 mL/min — ABNORMAL LOW (ref >60)
GFR calc non Af Amer: 51 mL/min — ABNORMAL LOW (ref >60)
Glucose, Bld: 98 mg/dL (ref 65–99)
Potassium: 3 mmol/L — ABNORMAL LOW (ref 3.5–5.1)
Sodium: 139 mmol/L (ref 135–145)

## 2016-10-05 LAB — TROPONIN I
Troponin I: <0.03 ng/mL (ref <0.03)
Troponin I: <0.03 ng/mL (ref <0.03)

## 2016-10-05 NOTE — ED Notes (Signed)
Patient transported to X-ray 

## 2016-10-05 NOTE — ED Provider Notes (Signed)
[facility] Emergency Department Provider Note        Time seen: ----------------------------------------- 9:46 AM on 10/05/2016 -----------------------------------------    I have reviewed the triage vital signs and the nursing notes.   HISTORY  Chief Complaint Chest Pain    HPI Kristi Lee is a 61 y.o. female who presents to ER for chest pain for the last 3 months. Patient states pain is constant, nothing makes it better or worse. Currently pain is 5 out of 10. She denies any other associated symptoms. She denies fevers, chills or other complaints.   Past Medical History:  Diagnosis Date  . Depression   . GERD (gastroesophageal reflux disease)   . Hypertension     Patient Active Problem List   Diagnosis Date Noted  . Adaptation reaction 01/27/2015  . Back pain, chronic 01/27/2015  . CN (constipation) 01/27/2015  . Abnormal LFTs 01/27/2015  . Essential (primary) hypertension 01/27/2015  . Gastro-esophageal reflux disease without esophagitis 01/27/2015  . Personal history of arthritis 01/27/2015  . Insomnia 01/27/2015  . Scoliosis 01/27/2015  . FOM (frequency of micturition) 01/27/2015    Past Surgical History:  Procedure Laterality Date  . BACK SURGERY  10/2000  . BREAST BIOPSY Right 1990's  . FEMUR FRACTURE SURGERY Right 07/15/2009   Pertrochanteric femur fracture.  [name] HIP FRACTURE SURGERY Right   . REVISION TOTAL HIP ARTHROPLASTY Left     Allergies Patient has no known allergies.  Social History Social History  Substance Use Topics  . Smoking status: Never Smoker  . Smokeless tobacco: Never Used  . Alcohol use No    Review of Systems Constitutional: Negative for fever. Cardiovascular: Positive for chest pain Respiratory: Negative for shortness of breath. Gastrointestinal: Negative for abdominal pain, vomiting and diarrhea. Genitourinary: Negative for dysuria. Musculoskeletal: Negative for back pain. Skin:  Negative for rash. Neurological: Negative for headaches, focal weakness or numbness.  10-point ROS otherwise negative.  ____________________________________________   PHYSICAL EXAM:  VITAL SIGNS: ED Triage Vitals  Enc Vitals Group     BP 10/05/16 0908 119/63     Pulse Rate 10/05/16 0908 91     Resp 10/05/16 0908 20     Temp 10/05/16 0908 98.5 F (36.9 C)     Temp Source 10/05/16 0908 Oral     SpO2 10/05/16 0908 98 %     Weight 10/05/16 0908 120 lb (54.4 kg)     Height 10/05/16 0908 4\' 11"  (1.499 m)     Head Circumference --      Peak Flow --      Pain Score 10/05/16 0916 5     Pain Loc --      Pain Edu? --      Excl. in [place]? --     Constitutional: Alert and oriented. Well appearing and in no distress. Eyes: Conjunctivae are normal. PERRL. Normal extraocular movements. ENT   Head: Normocephalic and atraumatic.   Nose: No congestion/rhinnorhea.   Mouth/Throat: Mucous membranes are moist.   Neck: No stridor. Cardiovascular: Normal rate, regular rhythm. No murmurs, rubs, or gallops. Respiratory: Normal respiratory effort without tachypnea nor retractions. Breath sounds are clear and equal bilaterally. No wheezes/rales/rhonchi. Gastrointestinal: Soft and nontender. Normal bowel sounds Musculoskeletal: Nontender with normal range of motion in all extremities. No lower extremity tenderness nor edema. Neurologic:  Normal speech and language. No gross focal neurologic deficits are appreciated.  Skin:  Skin is warm, dry and intact. No rash noted. Psychiatric: Mood and affect are normal.  Speech and behavior are normal.  ____________________________________________  EKG: Interpreted by me.Sinus rhythm with a rate of 74 bpm, normal PR interval, normal QRS size, normal QT. Left axis deviation  ____________________________________________  ED COURSE:  Pertinent labs & imaging results that were available during my care of the patient were reviewed by me and considered  in my medical decision making (see chart for details). Patient presents to the ER in no distress with 3 weeks of chest pain. We will assess with labs and imaging.   Procedures ____________________________________________   LABS (pertinent positives/negatives)  Labs Reviewed  BASIC METABOLIC PANEL - Abnormal; Notable for the following:       Result Value   Potassium 3.0 (*)    BUN 22 (*)    Creatinine, Ser 1.13 (*)    GFR calc non Af Amer 51 (*)    GFR calc Af Amer 60 (*)    All other components within normal limits  CBC - Abnormal; Notable for the following:    MCV 79.9 (*)    RDW 16.1 (*)    All other components within normal limits  TROPONIN I  TROPONIN I    RADIOLOGY  X-rays unremarkable  ____________________________________________  FINAL ASSESSMENT AND PLAN  Chest pain  Plan: Patient with labs and imaging as dictated above. No clear etiology for the patient's chest pain is identified at this time. She is stable for outpatient cardiology follow-up.   Earleen Newport, MD   Note: This note was generated in part or whole with voice recognition software. Voice recognition is usually quite accurate but there are transcription errors that can and very often do occur. I apologize for any typographical errors that were not detected and corrected.     Earleen Newport, MD 10/05/16 1159

## 2016-10-05 NOTE — ED Triage Notes (Signed)
Pt complains of central chest pain for the last 3 months, pt denies any other symptoms

## 2016-10-06 ENCOUNTER — Other Ambulatory Visit: Payer: Self-pay

## 2016-10-06 MED ORDER — TRAZODONE HCL 100 MG PO TABS
100.0000 mg | ORAL_TABLET | Freq: Every day | ORAL | 1 refills | Status: DC
Start: 2016-10-06 — End: 2016-12-06

## 2016-10-10 DIAGNOSIS — I208 Other forms of angina pectoris: Secondary | ICD-10-CM | POA: Insufficient documentation

## 2016-10-10 DIAGNOSIS — E782 Mixed hyperlipidemia: Secondary | ICD-10-CM | POA: Insufficient documentation

## 2016-10-18 ENCOUNTER — Telehealth: Payer: Self-pay

## 2016-10-18 DIAGNOSIS — R350 Frequency of micturition: Secondary | ICD-10-CM

## 2016-10-18 MED ORDER — TOLTERODINE TARTRATE 2 MG PO TABS: 2.0000 mg | ORAL_TABLET | Freq: Two times a day (BID) | ORAL | 0 refills | Status: DC

## 2016-10-18 NOTE — Telephone Encounter (Signed)
[facility] refill Request  Medication: Detrol Tab 2MG   Qty: 180  She doesn't have a PCP listed. She last saw Dr. Venia Minks 09/17/15 for CPE.  FYI patient was also seen at ED on 10/05/2016 for nonspecific pain.  Does she need an appointment first.  Thanks,  -Vang Kraeger

## 2016-10-18 NOTE — Telephone Encounter (Signed)
Scheduled ov

## 2016-10-18 NOTE — Telephone Encounter (Signed)
Patient requires appt before more fills but one month supply sent to [facility]

## 2016-10-25 ENCOUNTER — Encounter: Payer: Self-pay | Admitting: General Practice

## 2016-10-26 ENCOUNTER — Ambulatory Visit (INDEPENDENT_AMBULATORY_CARE_PROVIDER_SITE_OTHER): Payer: 59 | Admitting: Physician Assistant

## 2016-10-26 ENCOUNTER — Encounter: Payer: Self-pay | Admitting: Physician Assistant

## 2016-10-26 VITALS — BP 124/84 | HR 72 | Temp 98.1°F | Resp 16 | Ht 66.0 in | Wt 178.4 lb

## 2016-10-26 DIAGNOSIS — H6981 Other specified disorders of Eustachian tube, right ear: Secondary | ICD-10-CM

## 2016-10-26 DIAGNOSIS — E7800 Pure hypercholesterolemia, unspecified: Secondary | ICD-10-CM | POA: Diagnosis not present

## 2016-10-26 DIAGNOSIS — Z1231 Encounter for screening mammogram for malignant neoplasm of breast: Secondary | ICD-10-CM

## 2016-10-26 DIAGNOSIS — Z114 Encounter for screening for human immunodeficiency virus [HIV]: Secondary | ICD-10-CM

## 2016-10-26 DIAGNOSIS — Z0000 Encounter for general adult medical examination without abnormal findings: Secondary | ICD-10-CM

## 2016-10-26 DIAGNOSIS — Z1239 Encounter for other screening for malignant neoplasm of breast: Secondary | ICD-10-CM

## 2016-10-26 DIAGNOSIS — I10 Essential (primary) hypertension: Secondary | ICD-10-CM | POA: Diagnosis not present

## 2016-10-26 DIAGNOSIS — Z1211 Encounter for screening for malignant neoplasm of colon: Secondary | ICD-10-CM | POA: Diagnosis not present

## 2016-10-26 DIAGNOSIS — Z1159 Encounter for screening for other viral diseases: Secondary | ICD-10-CM

## 2016-10-26 DIAGNOSIS — H6991 Unspecified Eustachian tube disorder, right ear: Secondary | ICD-10-CM

## 2016-10-26 LAB — IFOBT (OCCULT BLOOD): IFOBT: NEGATIVE

## 2016-10-26 NOTE — Progress Notes (Signed)
Patient: Kristi Lee, Female    DOB: 1956-01-12, 61 y.o.   MRN: [id number] Visit Date: 10/26/2016  Today's Provider: Mar Daring, PA-C   Chief Complaint  Patient presents with  . Annual Exam  . Hypertension   Subjective:    Annual wellness visit Kristi Lee is a 61 y.o. female. She feels well. She reports exercising 2 times a week. She reports she is sleeping well.   10/06/15 CPE  09/10/14 Pap-neg  11/19/15 Mammogram-BI-RADS 1  05/02/11 Colonoscopy-recheck 5-10 yrs -----------------------------------------------------------  Hypertension, follow-up:  BP Readings from Last 3 Encounters:  10/26/16 124/84  10/05/16 (!) 144/82  10/06/15 120/76    She was last seen for hypertension 1 years ago.  BP at that visit was 120/76. Management changes since that visit include no changes. She reports excellent compliance with treatment. She is not having side effects.  She is exercising. She is adherent to low salt diet.   Outside blood pressures are stable. She is experiencing none.  Patient denies chest pain and lower extremity edema.   Cardiovascular risk factors include hypertension and obesity (BMI >= 30 kg/m2).  Use of agents associated with hypertension: none.     Weight trend: stable Wt Readings from Last 3 Encounters:  10/26/16 178 lb 6.4 oz (80.9 kg)  10/05/16 182 lb (82.6 kg)  10/06/15 177 lb (80.3 kg)    Current diet: in general, a "healthy" diet    ------------------------------------------------------------------------   Review of Systems  Constitutional: Negative.   HENT: Positive for ear pain.   Eyes: Negative.   Respiratory: Negative.   Cardiovascular: Negative.   Gastrointestinal: Negative.   Endocrine: Negative.   Genitourinary: Negative.   Musculoskeletal: Negative.   Skin: Negative.   Allergic/Immunologic: Negative.   Neurological: Negative.   Hematological: Negative.   Psychiatric/Behavioral: Negative.      Social History   Social History  . Marital status: Married    Spouse name: N/A  . Number of children: 1  . Years of education: HS   Occupational History  . 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 Smoking status: Never Smoker  . Smokeless tobacco: Never Used  . Alcohol use No  . Drug use: No  . Sexual activity: Not on file   Other Topics Concern  . Not on file   Social History Narrative  . No narrative on file    Past Medical History:  Diagnosis Date  . Depression   . GERD (gastroesophageal reflux disease)   . Hypertension      Patient Active Problem List   Diagnosis Date Noted  . Hyperlipidemia, mixed 10/10/2016  . Stable angina pectoris ([place]) 10/10/2016  . Adaptation reaction 01/27/2015  . Back pain, chronic 01/27/2015  . CN (constipation) 01/27/2015  . Abnormal LFTs 01/27/2015  . Benign essential HTN 01/27/2015  . Gastro-esophageal reflux disease without esophagitis 01/27/2015  . Personal history of arthritis 01/27/2015  . Insomnia 01/27/2015  . Scoliosis 01/27/2015  . FOM (frequency of micturition) 01/27/2015    Past Surgical History:  Procedure Laterality Date  . ABDOMINAL HYSTERECTOMY    . BACK SURGERY  10/2000  . BREAST BIOPSY Right 1990's  . FEMUR FRACTURE SURGERY Right 07/15/2009   Pertrochanteric femur fracture.  [name] HIP FRACTURE SURGERY Right   . REVISION TOTAL HIP ARTHROPLASTY Left     Her family history includes Diabetes in her brother; Lung cancer in her father; Osteoarthritis in her mother.  Current Outpatient Prescriptions:  .  aspirin EC 81 MG tablet, Take 81 mg by mouth daily., Disp: , Rfl:  .  atenolol (TENORMIN) 25 MG tablet, TAKE 1 TABLET BY MOUTH  DAILY, Disp: 90 tablet, Rfl: 1 .  celecoxib (CELEBREX) 200 MG capsule, Take 1 capsule (200 mg total) by mouth daily., Disp: 90 capsule, Rfl: 3 .  omeprazole (PRILOSEC) 20 MG capsule, TAKE 1 CAPSULE BY MOUTH  DAILY, Disp: 90 capsule, Rfl: 1 .  tolterodine  (DETROL) 2 MG tablet, Take 1 tablet (2 mg total) by mouth 2 (two) times daily., Disp: 60 tablet, Rfl: 0 .  traZODone (DESYREL) 100 MG tablet, Take 1 tablet (100 mg total) by mouth at bedtime., Disp: 90 tablet, Rfl: 1 .  triamterene-hydrochlorothiazide (MAXZIDE-25) 37.5-25 MG tablet, Take 1 tablet by mouth daily., Disp: 90 tablet, Rfl: 3 .  venlafaxine XR (EFFEXOR-XR) 75 MG 24 hr capsule, TAKE 1 CAPSULE BY MOUTH  DAILY, Disp: 90 capsule, Rfl: 1  Patient Care Team: Mar Daring, PA-C as PCP - General (Family Medicine)     Objective:   Vitals: BP 124/84 (BP Location: Left Arm, Patient Position: Sitting, Cuff Size: Large)   Pulse 72   Temp 98.1 F (36.7 C) (Oral)   Resp 16   Ht 5\' 6"  (1.676 m)   Wt 178 lb 6.4 oz (80.9 kg)   SpO2 99%   BMI 28.79 kg/m   Physical Exam  Constitutional: She is oriented to person, place, and time. She appears well-developed and well-nourished. No distress.  HENT:  Head: Normocephalic and atraumatic.  Right Ear: Hearing, external ear and ear canal normal. A middle ear effusion is present.  Left Ear: Hearing, tympanic membrane, external ear and ear canal normal.  Nose: Nose normal. Right sinus exhibits no maxillary sinus tenderness and no frontal sinus tenderness. Left sinus exhibits no maxillary sinus tenderness and no frontal sinus tenderness.  Mouth/Throat: Uvula is midline, oropharynx is clear and moist and mucous membranes are normal. No oropharyngeal exudate.    Eyes: Conjunctivae and EOM are normal. Pupils are equal, round, and reactive to light. Right eye exhibits no discharge. Left eye exhibits no discharge. No scleral icterus.  Neck: Normal range of motion. Neck supple. No JVD present. No tracheal deviation present. No thyromegaly present.  Cardiovascular: Normal rate, regular rhythm, normal heart sounds and intact distal pulses.  Exam reveals no gallop and no friction rub.   No murmur heard. Pulmonary/Chest: Effort normal and breath sounds  normal. No respiratory distress. She has no wheezes. She has no rales. She exhibits no tenderness.  Abdominal: Soft. Bowel sounds are normal. She exhibits no distension and no mass. There is no tenderness. There is no rebound and no guarding. Hernia confirmed negative in the right inguinal area and confirmed negative in the left inguinal area.  Genitourinary: Rectum normal and vagina normal. Rectal exam shows no external hemorrhoid, no internal hemorrhoid, no fissure, no mass, no tenderness, anal tone normal and guaiac negative stool. No breast swelling, tenderness, discharge or bleeding. Pelvic exam was performed with patient supine. There is no rash, tenderness, lesion or injury on the right labia. There is no rash, tenderness, lesion or injury on the left labia. Right adnexum displays no mass, no tenderness and no fullness. Left adnexum displays no mass, no tenderness and no fullness. No erythema, tenderness or bleeding in the vagina. No foreign body in the vagina. No signs of injury around the vagina. No vaginal discharge found.  Genitourinary Comments: Cervix and  uterus surgically absent  Musculoskeletal: Normal range of motion. She exhibits no edema or tenderness.  Lymphadenopathy:    She has no cervical adenopathy.       Right: No inguinal adenopathy present.       Left: No inguinal adenopathy present.  Neurological: She is alert and oriented to person, place, and time.  Skin: Skin is warm and dry. No rash noted. She is not diaphoretic.  Psychiatric: She has a normal mood and affect. Her behavior is normal. Judgment and thought content normal.  Vitals reviewed.   Activities of Daily Living In your present state of health, do you have any difficulty performing the following activities: 10/26/2016  Hearing? N  Vision? N  Difficulty concentrating or making decisions? N  Walking or climbing stairs? N  Dressing or bathing? N  Doing errands, shopping? N  Some recent data might be hidden     Fall Risk Assessment Fall Risk  10/26/2016 10/06/2015  Falls in the past year? No No     Depression Screen PHQ 2/9 Scores 10/26/2016 10/06/2015  PHQ - 2 Score 0 0  PHQ- 9 Score 0 -    Cognitive Testing - 6-CIT  Correct? Score   What year is it? yes 0 0 or 4  What month is it? yes 0 0 or 3  Memorize:    Pia Mau,  [birth year],  [place],      What time is it? (within 1 hour) yes 0 0 or 3  Count backwards from 20 yes 0 0, 2, or 4  Name the months of the year yes 0 0, 2, or 4  Repeat name & address above yes 0 0, 2, 4, 6, 8, or 10       TOTAL SCORE  0/28   Interpretation:  Normal  Normal (0-7) Abnormal (8-28)    Audit-C Alcohol Use Screening  Question Answer Points  How often do you have alcoholic drink? 2 times yearly 0  On days you do drink alcohol, how many drinks do you typically consume? 0 0  How oftey will you drink 6 or more in a total? never 0  Total Score:  0   A score of 3 or more in women, and 4 or more in men indicates increased risk for alcohol abuse, EXCEPT if all of the points are from question 1.     Assessment & Plan:     Annual Wellness Visit  Reviewed patient's Family Medical History Reviewed and updated list of patient's medical providers Assessment of cognitive impairment was done Assessed patient's functional ability Established a written schedule for health screening [place] Completed and Reviewed  Exercise Activities and Dietary recommendations Goals    . Exercise 150 minutes per week (moderate activity)       Immunization History  Administered Date(s) Administered  . Tdap 08/01/2012    Health Maintenance  Topic Date Due  . Hepatitis C Screening  [birth year]  . HIV Screening  04/08/1970  . INFLUENZA VACCINE  05/28/2017 (Originally 03/15/2016)  . PAP SMEAR  09/10/2017  . MAMMOGRAM  11/18/2017  . COLONOSCOPY  05/01/2021  . TETANUS/TDAP  08/01/2022     Discussed health benefits of physical activity,  and encouraged her to engage in regular exercise appropriate for her age and condition.    1. Annual physical exam Normal physical exam today. Will check labs as below and f/u pending lab results. If labs are stable and WNL she will not  need to have these rechecked for one year at her next annual physical exam. She is to call the office in the meantime if she has any acute issue, questions or concerns. - CBC with Differential/Platelet - Comprehensive metabolic panel - TSH  2. Screening for breast cancer Breast exam today was normal. There is no family history of breast cancer. She does perform regular self breast exams. Mammogram was ordered as below. Information for [facility] clinic was given to patient so she may schedule her mammogram at her convenience. - MM DIGITAL SCREENING BILATERAL; Future  3. Colon cancer screening OC lite collected today and was negative. - IFOBT POC (occult bld, rslt in office)  4. Essential (primary) hypertension Stable. Continue current medical treatment plan with atenolol 25mg  and Maxzide 37.5-25mg . Will check labs as below and f/u pending results. - CBC with Differential/Platelet - Comprehensive metabolic panel  5. Hypercholesteremia Stable. Continue current medical treatment plan. Will check labs as below and f/u pending results. - CBC with Differential/Platelet - Comprehensive metabolic panel - Lipid Panel With LDL/HDL Ratio  6. ETD (Eustachian tube dysfunction), right Discussed using flonase. She is to call if symptoms worsen.  7. Screening for HIV (human immunodeficiency virus) - HIV antibody  8. Need for hepatitis C screening test - Hepatitis C antibody  ------------------------------------------------------------------------------------------------------------    Mar Daring, PA-C  [facility]

## 2016-10-26 NOTE — Patient Instructions (Signed)
Health Maintenance for Postmenopausal Women Menopause is a normal process in which your reproductive ability comes to an end. This process happens gradually over a span of months to years, usually between the ages of 33 and 38. Menopause is complete when you have missed 12 consecutive menstrual periods. It is important to talk with your health care provider about some of the most common conditions that affect postmenopausal women, such as heart disease, cancer, and bone loss (osteoporosis). Adopting a healthy lifestyle and getting preventive care can help to promote your health and wellness. Those actions can also lower your chances of developing some of these common conditions. What should I know about menopause? During menopause, you may experience a number of symptoms, such as:  Moderate-to-severe hot flashes.  Night sweats.  Decrease in sex drive.  Mood swings.  Headaches.  Tiredness.  Irritability.  Memory problems.  Insomnia. Choosing to treat or not to treat menopausal changes is an individual decision that you make with your health care provider. What should I know about hormone replacement therapy and supplements? Hormone therapy products are effective for treating symptoms that are associated with menopause, such as hot flashes and night sweats. Hormone replacement carries certain risks, especially as you become older. If you are thinking about using estrogen or estrogen with progestin treatments, discuss the benefits and risks with your health care provider. What should I know about heart disease and stroke? Heart disease, heart attack, and stroke become more likely as you age. This may be due, in part, to the hormonal changes that your body experiences during menopause. These can affect how your body processes dietary fats, triglycerides, and cholesterol. Heart attack and stroke are both medical emergencies. There are many things that you can do to help prevent heart disease  and stroke:  Have your blood pressure checked at least every 1-2 years. High blood pressure causes heart disease and increases the risk of stroke.  If you are 48-61 years old, ask your health care provider if you should take aspirin to prevent a heart attack or a stroke.  Do not use any tobacco products, including cigarettes, chewing tobacco, or electronic cigarettes. If you need help quitting, ask your health care provider.  It is important to eat a healthy diet and maintain a healthy weight.  Be sure to include plenty of vegetables, fruits, low-fat dairy products, and lean protein.  Avoid eating foods that are high in solid fats, added sugars, or salt (sodium).  Get regular exercise. This is one of the most important things that you can do for your health.  Try to exercise for at least 150 minutes each week. The type of exercise that you do should increase your heart rate and make you sweat. This is known as moderate-intensity exercise.  Try to do strengthening exercises at least twice each week. Do these in addition to the moderate-intensity exercise.  Know your numbers.Ask your health care provider to check your cholesterol and your blood glucose. Continue to have your blood tested as directed by your health care provider. What should I know about cancer screening? There are several types of cancer. Take the following steps to reduce your risk and to catch any cancer development as early as possible. Breast Cancer  Practice breast self-awareness.  This means understanding how your breasts normally appear and feel.  It also means doing regular breast self-exams. Let your health care provider know about any changes, no matter how small.  If you are 40 or older,  have a clinician do a breast exam (clinical breast exam or CBE) every year. Depending on your age, family history, and medical history, it may be recommended that you also have a yearly breast X-ray (mammogram).  If you  have a family history of breast cancer, talk with your health care provider about genetic screening.  If you are at high risk for breast cancer, talk with your health care provider about having an MRI and a mammogram every year.  Breast cancer (BRCA) gene test is recommended for women who have family members with BRCA-related cancers. Results of the assessment will determine the need for genetic counseling and BRCA1 and for BRCA2 testing. BRCA-related cancers include these types:  Breast. This occurs in males or females.  Ovarian.  Tubal. This may also be called fallopian tube cancer.  Cancer of the abdominal or pelvic lining (peritoneal cancer).  Prostate.  Pancreatic. Cervical, Uterine, and Ovarian Cancer  Your health care provider may recommend that you be screened regularly for cancer of the pelvic organs. These include your ovaries, uterus, and vagina. This screening involves a pelvic exam, which includes checking for microscopic changes to the surface of your cervix (Pap test).  For women ages 21-65, health care providers may recommend a pelvic exam and a Pap test every three years. For women ages 23-65, they may recommend the Pap test and pelvic exam, combined with testing for human papilloma virus (HPV), every five years. Some types of HPV increase your risk of cervical cancer. Testing for HPV may also be done on women of any age who have unclear Pap test results.  Other health care providers may not recommend any screening for nonpregnant women who are considered low risk for pelvic cancer and have no symptoms. Ask your health care provider if a screening pelvic exam is right for you.  If you have had past treatment for cervical cancer or a condition that could lead to cancer, you need Pap tests and screening for cancer for at least 20 years after your treatment. If Pap tests have been discontinued for you, your risk factors (such as having a new sexual partner) need to be reassessed  to determine if you should start having screenings again. Some women have medical problems that increase the chance of getting cervical cancer. In these cases, your health care provider may recommend that you have screening and Pap tests more often.  If you have a family history of uterine cancer or ovarian cancer, talk with your health care provider about genetic screening.  If you have vaginal bleeding after reaching menopause, tell your health care provider.  There are currently no reliable tests available to screen for ovarian cancer. Lung Cancer  Lung cancer screening is recommended for adults 99-83 years old who are at high risk for lung cancer because of a history of smoking. A yearly low-dose CT scan of the lungs is recommended if you:  Currently smoke.  Have a history of at least 30 pack-years of smoking and you currently smoke or have quit within the past 15 years. A pack-year is smoking an average of one pack of cigarettes per day for one year. Yearly screening should:  Continue until it has been 15 years since you quit.  Stop if you develop a health problem that would prevent you from having lung cancer treatment. Colorectal Cancer  This type of cancer can be detected and can often be prevented.  Routine colorectal cancer screening usually begins at age 72 and continues  through age 75.  If you have risk factors for colon cancer, your health care provider may recommend that you be screened at an earlier age.  If you have a family history of colorectal cancer, talk with your health care provider about genetic screening.  Your health care provider may also recommend using home test kits to check for hidden blood in your stool.  A small camera at the end of a tube can be used to examine your colon directly (sigmoidoscopy or colonoscopy). This is done to check for the earliest forms of colorectal cancer.  Direct examination of the colon should be repeated every 5-10 years until  age 75. However, if early forms of precancerous polyps or small growths are found or if you have a family history or genetic risk for colorectal cancer, you may need to be screened more often. Skin Cancer  Check your skin from head to toe regularly.  Monitor any moles. Be sure to tell your health care provider:  About any new moles or changes in moles, especially if there is a change in a mole's shape or color.  If you have a mole that is larger than the size of a pencil eraser.  If any of your family members has a history of skin cancer, especially at a young age, talk with your health care provider about genetic screening.  Always use sunscreen. Apply sunscreen liberally and repeatedly throughout the day.  Whenever you are outside, protect yourself by wearing long sleeves, pants, a wide-brimmed hat, and sunglasses. What should I know about osteoporosis? Osteoporosis is a condition in which bone destruction happens more quickly than new bone creation. After menopause, you may be at an increased risk for osteoporosis. To help prevent osteoporosis or the bone fractures that can happen because of osteoporosis, the following is recommended:  If you are 19-50 years old, get at least 1,000 mg of calcium and at least 600 mg of vitamin D per day.  If you are older than age 50 but younger than age 70, get at least 1,200 mg of calcium and at least 600 mg of vitamin D per day.  If you are older than age 70, get at least 1,200 mg of calcium and at least 800 mg of vitamin D per day. Smoking and excessive alcohol intake increase the risk of osteoporosis. Eat foods that are rich in calcium and vitamin D, and do weight-bearing exercises several times each week as directed by your health care provider. What should I know about how menopause affects my mental health? Depression may occur at any age, but it is more common as you become older. Common symptoms of depression include:  Low or sad  mood.  Changes in sleep patterns.  Changes in appetite or eating patterns.  Feeling an overall lack of motivation or enjoyment of activities that you previously enjoyed.  Frequent crying spells. Talk with your health care provider if you think that you are experiencing depression. What should I know about immunizations? It is important that you get and maintain your immunizations. These include:  Tetanus, diphtheria, and pertussis (Tdap) booster vaccine.  Influenza every year before the flu season begins.  Pneumonia vaccine.  Shingles vaccine. Your health care provider may also recommend other immunizations. This information is not intended to replace advice given to you by your health care provider. Make sure you discuss any questions you have with your health care provider. Document Released: 09/23/2005 Document Revised: 02/19/2016 Document Reviewed: 05/05/2015 Elsevier Interactive Patient   Education  2017 [employer]  

## 2016-10-27 ENCOUNTER — Telehealth: Payer: Self-pay

## 2016-10-27 LAB — COMPREHENSIVE METABOLIC PANEL
ALT: 22 IU/L (ref 0–32)
AST: 23 IU/L (ref 0–40)
Albumin/Globulin Ratio: 1.8 (ref 1.2–2.2)
Albumin: 4.6 g/dL (ref 3.6–4.8)
Alkaline Phosphatase: 159 IU/L — ABNORMAL HIGH (ref 39–117)
BUN/Creatinine Ratio: 18 (ref 12–28)
BUN: 19 mg/dL (ref 8–27)
Bilirubin Total: 0.5 mg/dL (ref 0.0–1.2)
CO2: 27 mmol/L (ref 18–29)
Calcium: 9.8 mg/dL (ref 8.7–10.3)
Chloride: 100 mmol/L (ref 96–106)
Creatinine, Ser: 1.05 mg/dL — ABNORMAL HIGH (ref 0.57–1.00)
GFR calc Af Amer: 66 mL/min/{1.73_m2} (ref >59)
GFR calc non Af Amer: 57 mL/min/{1.73_m2} — ABNORMAL LOW (ref >59)
Globulin, Total: 2.6 g/dL (ref 1.5–4.5)
Glucose: 103 mg/dL — ABNORMAL HIGH (ref 65–99)
Potassium: 4.3 mmol/L (ref 3.5–5.2)
Sodium: 144 mmol/L (ref 134–144)
Total Protein: 7.2 g/dL (ref 6.0–8.5)

## 2016-10-27 LAB — LIPID PANEL WITH LDL/HDL RATIO
Cholesterol, Total: 211 mg/dL — ABNORMAL HIGH (ref 100–199)
HDL: 66 mg/dL (ref >39)
LDL Calculated: 118 mg/dL — ABNORMAL HIGH (ref 0–99)
LDl/HDL Ratio: 1.8 ratio units (ref 0.0–3.2)
Triglycerides: 134 mg/dL (ref 0–149)
VLDL Cholesterol Cal: 27 mg/dL (ref 5–40)

## 2016-10-27 LAB — CBC WITH DIFFERENTIAL/PLATELET
Basophils Absolute: 0.1 10*3/uL (ref 0.0–0.2)
Basos: 1 %
EOS (ABSOLUTE): 0.2 10*3/uL (ref 0.0–0.4)
Eos: 2 %
Hematocrit: 41 % (ref 34.0–46.6)
Hemoglobin: 13.3 g/dL (ref 11.1–15.9)
Immature Grans (Abs): 0 10*3/uL (ref 0.0–0.1)
Immature Granulocytes: 0 %
Lymphocytes Absolute: 2 10*3/uL (ref 0.7–3.1)
Lymphs: 22 %
MCH: 26.8 pg (ref 26.6–33.0)
MCHC: 32.4 g/dL (ref 31.5–35.7)
MCV: 83 fL (ref 79–97)
Monocytes Absolute: 1 10*3/uL — ABNORMAL HIGH (ref 0.1–0.9)
Monocytes: 11 %
Neutrophils Absolute: 5.8 10*3/uL (ref 1.4–7.0)
Neutrophils: 64 %
Platelets: 238 10*3/uL (ref 150–379)
RBC: 4.97 x10E6/uL (ref 3.77–5.28)
RDW: 15.7 % — ABNORMAL HIGH (ref 12.3–15.4)
WBC: 9.1 10*3/uL (ref 3.4–10.8)

## 2016-10-27 LAB — TSH: TSH: 2.56 u[IU]/mL (ref 0.450–4.500)

## 2016-10-27 LAB — HIV ANTIBODY (ROUTINE TESTING W REFLEX): HIV Screen 4th Generation wRfx: NONREACTIVE

## 2016-10-27 LAB — HEPATITIS C ANTIBODY: Hep C Virus Ab: <0.1 s/co ratio (ref 0.0–0.9)

## 2016-10-27 NOTE — Telephone Encounter (Signed)
LMTCB  Thanks,  -Ladislao Cohenour 

## 2016-10-27 NOTE — Telephone Encounter (Signed)
-----   Message from Mar Daring, [place] sent at 10/27/2016  8:19 AM EDT ----- All labs are within normal limits and stable with exception of cholesterol that is slightly increased from labs last year. Remember to work on healthy lifestyle modifications with healthy dieting limiting fatty foods, red meats, processed foods, and sugars. Also work on increasing physical activity.  Thanks! -JB

## 2016-10-31 NOTE — Telephone Encounter (Signed)
Patient advised as directed below.  Thanks,  -Joseline 

## 2016-11-21 ENCOUNTER — Other Ambulatory Visit: Payer: Self-pay | Admitting: Physician Assistant

## 2016-11-21 DIAGNOSIS — K219 Gastro-esophageal reflux disease without esophagitis: Secondary | ICD-10-CM

## 2016-11-29 ENCOUNTER — Other Ambulatory Visit: Payer: Self-pay | Admitting: Physician Assistant

## 2016-11-29 MED ORDER — CELECOXIB 200 MG PO CAPS: 200.0000 mg | ORAL_CAPSULE | Freq: Every day | ORAL | 3 refills | Status: DC

## 2016-11-29 NOTE — Telephone Encounter (Signed)
[facility] faxed a request on the following medication. Thanks CC  celecoxib (CELEBREX) 200 MG capsule  Take 1 capsule by mouth daily.

## 2016-11-30 ENCOUNTER — Other Ambulatory Visit: Payer: Self-pay | Admitting: Physician Assistant

## 2016-11-30 MED ORDER — CELECOXIB 200 MG PO CAPS: 200.0000 mg | ORAL_CAPSULE | Freq: Every day | ORAL | 3 refills | Status: DC

## 2016-11-30 NOTE — Progress Notes (Signed)
Celebrex refilled 

## 2016-12-06 ENCOUNTER — Other Ambulatory Visit: Payer: Self-pay

## 2016-12-06 DIAGNOSIS — F4323 Adjustment disorder with mixed anxiety and depressed mood: Secondary | ICD-10-CM

## 2016-12-06 DIAGNOSIS — F5101 Primary insomnia: Secondary | ICD-10-CM

## 2016-12-06 DIAGNOSIS — I10 Essential (primary) hypertension: Secondary | ICD-10-CM

## 2016-12-06 NOTE — Telephone Encounter (Signed)
Patient sent an email requesting refills into mail order pharmacy. Thanks!

## 2016-12-07 MED ORDER — TRAZODONE HCL 100 MG PO TABS: 100.0000 mg | ORAL_TABLET | Freq: Every day | ORAL | 1 refills | Status: DC

## 2016-12-07 MED ORDER — ATENOLOL 25 MG PO TABS: 25.0000 mg | ORAL_TABLET | Freq: Every day | ORAL | 1 refills | Status: DC

## 2016-12-07 MED ORDER — VENLAFAXINE HCL ER 75 MG PO CP24: 75.0000 mg | ORAL_CAPSULE | Freq: Every day | ORAL | 1 refills | Status: DC

## 2016-12-15 ENCOUNTER — Ambulatory Visit
Admission: RE | Admit: 2016-12-15 | Discharge: 2016-12-15 | Disposition: A | Discharge status: Home / Self Care | Payer: 59 | Source: Ambulatory Visit | Attending: Physician Assistant | Admitting: Physician Assistant

## 2016-12-15 DIAGNOSIS — Z1231 Encounter for screening mammogram for malignant neoplasm of breast: Secondary | ICD-10-CM | POA: Diagnosis not present

## 2016-12-15 DIAGNOSIS — Z1239 Encounter for other screening for malignant neoplasm of breast: Secondary | ICD-10-CM

## 2016-12-16 ENCOUNTER — Telehealth: Payer: Self-pay

## 2016-12-16 NOTE — Telephone Encounter (Signed)
Pt advised. Emily Drozdowski, CMA  

## 2016-12-16 NOTE — Telephone Encounter (Signed)
-----   Message from Mar Daring, [place] sent at 12/16/2016  4:27 PM EDT ----- Normal mammogram. Repeat screening in one year.

## 2017-01-25 ENCOUNTER — Ambulatory Visit: Payer: 59 | Attending: Orthopedic Surgery | Admitting: Physical Therapy

## 2017-01-25 DIAGNOSIS — G8929 Other chronic pain: Secondary | ICD-10-CM | POA: Diagnosis not present

## 2017-01-25 DIAGNOSIS — S76319A Strain of muscle, fascia and tendon of the posterior muscle group at thigh level, unspecified thigh, initial encounter: Secondary | ICD-10-CM | POA: Diagnosis present

## 2017-01-25 DIAGNOSIS — M5442 Lumbago with sciatica, left side: Secondary | ICD-10-CM | POA: Diagnosis present

## 2017-01-25 DIAGNOSIS — R262 Difficulty in walking, not elsewhere classified: Secondary | ICD-10-CM | POA: Diagnosis not present

## 2017-01-25 DIAGNOSIS — X58XXXA Exposure to other specified factors, initial encounter: Secondary | ICD-10-CM | POA: Insufficient documentation

## 2017-01-25 NOTE — Patient Instructions (Signed)
Trunk flexion to mid shin with Gowers sign on the return   Trunk extension - no pain but no movement (tightness/stiffness)   No pain with rotations/side flexion   L patellar 3+ R patellar 2+ Ankles - 2+   Slump - ++ bilaterally

## 2017-01-26 NOTE — Therapy (Signed)
[facility] [address], [place], [address] Phone: [phone]   Fax:  [phone]  Physical Therapy Evaluation  Patient Details  Name: Kristi Lee MRN: [id number] Date of Birth: September 13, 1955 No Data Recorded  Encounter Date: 01/25/2017      PT End of Session - 01/26/17 1029    Visit Number 1   Number of Visits 9   Date for PT Re-Evaluation 03/09/17   PT Start Time 1600   PT Stop Time 1700   PT Time Calculation (min) 60 min   Activity Tolerance Patient tolerated treatment well   Behavior During Therapy [facility] for tasks assessed/performed      Past Medical History:  Diagnosis Date  . Depression   . GERD (gastroesophageal reflux disease)   . Hypertension     Past Surgical History:  Procedure Laterality Date  . ABDOMINAL HYSTERECTOMY    . BACK SURGERY  10/2000  . BREAST EXCISIONAL BIOPSY Right 1990's   NEG  . FEMUR FRACTURE SURGERY Right 07/15/2009   Pertrochanteric femur fracture.  [name] HIP FRACTURE SURGERY Right   . REVISION TOTAL HIP ARTHROPLASTY Left     There were no vitals filed for this visit.       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 She began having buttocks pain bilaterally starting last year, primarily while driving. It has since progressed to while walking, even short distances now. She reports laying down is not as bad, pain is always below the belt line (no back pain). Reports some numbness at times in her LEs. Pain is worse on RLE than LLE. She belongs to a pool and goes at least 2x/week. 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 She had an SIJ injection 4 weeks ago with no relief.    Limitations Walking;Sitting;House hold activities   How long can you walk comfortably? Pain with even minimal ambulation    Diagnostic tests SIJ injection - no  pain, nothing on X-rays identified.    Patient Stated Goals To be able to walk and drive without pain.    Currently in Pain? Yes   Pain Score 4    Pain Location Buttocks   Pain Orientation Left;Right;Distal   Pain Descriptors / Indicators Aching   Pain Type Chronic pain   Pain Onset More than a month ago   Pain Frequency Constant   Aggravating Factors  Walking, sitting, driving    Pain Relieving Factors Laying down       Trunk flexion to mid shin with Gowers sign on the return   Trunk extension - no pain but no movement (tightness/stiffness)   No pain with rotations/side flexion   L patellar 3+ R patellar 2+ Ankles - 2+   Slump - ++ bilaterally   Standing posture- notable for fairly significant hip flexion contracture bilaterally, most evident in gait analysis with preference for hip IR both in standing and gait (toe in gait pattern) with minimal to no hip extension bilaterally. Notable for complete rigidity throughout spine.   Hip flexion limited to 90 degrees before tightness noted, no hip ER bilaterally at 90 degrees and mild pain and <25% of anticipated ROM at 90 degrees of flexion into IR bilaterally -- notable clunk in L hip at 90 degrees, patient reports this is not the first time she has experienced this.   90-90  HS length noted reproduction of her pain in bilateral ischial tuberosities though good length appreciated   In L sidelying testing R hip abductors mild pain with 4/5 appreciated, 4/5 on LLE but no pain   In prone -- unable to bring ASIS or anterior iliac crest to table secondary to fairly significant hip flexion contractures bilaterally. Decreased length but no pain with Ely's test   Pain significantly reproduced with knee flexion resisted testing in prone bilaterally   Pain with palpation over ischial tuberosity bilaterally   No mobility appreciated whatsoever throughout spine on CPAs  Treatment  Performed soft tissue mobilization over ischial  tuberosities with significant relief reported by patient after completion   Educated and had patient complete supine HS stretching with belt, no stretch sensation felt, so switched to SLR on wedge with belt through pain free range   Prone laying to encourage low load  Long duration hip flexor stretch to reduce contractures for reduced length demand on HS              Objective measurements completed on examination: See above findings.                  PT Education - 01/26/17 1027    Education provided Yes   Education Details Her source of pain appears to be proximal hamstring tendon and would benefit from soft tissue work and stretching.    Person(s) Educated Patient   Methods Explanation;Demonstration;Handout   Comprehension Verbalized understanding;Returned demonstration             PT Long Term Goals - 01/26/17 1404      PT LONG TERM GOAL #1   Title Patient will report mODI of less than 30% disability to demonstrate improved tolerance for ADLs.    Baseline 48%   Time 6   Period Weeks   Status New     PT LONG TERM GOAL #2   Title Patient will report no increase in pain with driving for at least 30 minutes to complete ADLs    Baseline Pain with even short distance driving    Time 6   Period Weeks   Status New     PT LONG TERM GOAL #3   Title Patient will report no pain with hamstrings length testing to demonstrate improved tolerance for fast walking.    Baseline Pain with slump, 90-90 testing    Time 6   Period Weeks   Status New     PT LONG TERM GOAL #4   Title Patient will report worst pain of no more than 3/10 to demonstrate improved tolerance for ADLs.    Baseline 7/10   Time 6   Period Weeks   Status New                Plan - 01/26/17 1031    Clinical Impression Statement Patient reports bilateral pain over ischial tuberosity with no known MOI. She received an injection into bilateral SIJ with no relief. In this evaluation  her symptoms were reproduced with all testing involving lengthening or resisting hamstrings activity. She has developed bilateral hip flexion contracture, putting her in relative hip flexion, and her pain is exacerbated in sitting with knee extended which would lengthen hamstring maximially. She has a complicated orthopedic history with bilateral THRs and rods to manage scoliosis essentially throughout her entire spine. Given her presentation this appears to be more related to proximal hamstrings insertional strains and would benefit from stretching and soft tissue management.  Eventually she will be progressed to eccentric training to alter length-tension relationship of HS for reduced risk of recurrence. Patient would benefit from supervised management of this condition given the complexity of her case and duration of symptoms.    Clinical Presentation Stable   Clinical Decision Making Moderate   Rehab Potential Good   PT Frequency 2x / week   PT Duration 4 weeks   PT Treatment/Interventions Aquatic Therapy;Moist Heat;Iontophoresis 4mg /ml Dexamethasone;Gait training;Stair training;Neuromuscular re-education;Manual techniques;Taping;Dry needling;Therapeutic exercise;Therapeutic activities;Ultrasound;Cryotherapy;Electrical Stimulation   PT Next Visit Plan Soft tissue mobilization/e-stim for pain management at ischial tuberosity    PT Home Exercise Plan Heat/stretching of HS and soft tissue mobilization over ischial tuberosity   Consulted and Agree with Plan of Care Patient      Patient will benefit from skilled therapeutic intervention in order to improve the following deficits and impairments:  Abnormal gait, Difficulty walking, Pain, Decreased range of motion, Decreased strength  Visit Diagnosis: Difficulty in walking, not elsewhere classified - Plan: PT plan of care cert/re-cert  Hamstring strain, unspecified laterality, initial encounter - Plan: PT plan of care cert/re-cert  Chronic bilateral  low back pain with left-sided sciatica - Plan: PT plan of care cert/re-cert     Problem List Patient Active Problem List   Diagnosis Date Noted  . Hyperlipidemia, mixed 10/10/2016  . Stable angina pectoris ([place]) 10/10/2016  . Adaptation reaction 01/27/2015  . Back pain, chronic 01/27/2015  . CN (constipation) 01/27/2015  . Abnormal LFTs 01/27/2015  . Benign essential HTN 01/27/2015  . Gastro-esophageal reflux disease without esophagitis 01/27/2015  . Personal history of arthritis 01/27/2015  . Insomnia 01/27/2015  . Scoliosis 01/27/2015  . FOM (frequency of micturition) 01/27/2015   Royce Macadamia PT, DPT, CSCS    01/26/2017, 2:14 PM  [facility] [address] [place], [address] Phone: [phone]   Fax:  [phone]  Name: Kristi Lee MRN: [id number] Date of Birth: 09-Jan-1956

## 2017-01-30 ENCOUNTER — Ambulatory Visit: Payer: 59

## 2017-01-30 DIAGNOSIS — R262 Difficulty in walking, not elsewhere classified: Secondary | ICD-10-CM | POA: Diagnosis not present

## 2017-01-30 DIAGNOSIS — G8929 Other chronic pain: Secondary | ICD-10-CM

## 2017-01-30 DIAGNOSIS — M5442 Lumbago with sciatica, left side: Secondary | ICD-10-CM

## 2017-01-30 NOTE — Therapy (Signed)
[facility] [address] [place], [address] Phone: [phone]   Fax:  [phone]  Physical Therapy Treatment  Patient Details  Name: Kristi Lee MRN: [id number] Date of Birth: [birth year] No Data Recorded  Encounter Date: 01/30/2017      PT End of Session - 01/30/17 1527    Visit Number 2   Number of Visits 9   Date for PT Re-Evaluation 03/09/17   PT Start Time 5631   PT Stop Time 1615   PT Time Calculation (min) 45 min   Activity Tolerance Patient tolerated treatment well   Behavior During Therapy [facility] for tasks assessed/performed      Past Medical History:  Diagnosis Date  . Depression   . GERD (gastroesophageal reflux disease)   . Hypertension     Past Surgical History:  Procedure Laterality Date  . ABDOMINAL HYSTERECTOMY    . BACK SURGERY  10/2000  . BREAST EXCISIONAL BIOPSY Right 1990's   NEG  . FEMUR FRACTURE SURGERY Right 07/15/2009   Pertrochanteric femur fracture.  [name] HIP FRACTURE SURGERY Right   . REVISION TOTAL HIP ARTHROPLASTY Left     There were no vitals filed for this visit.      Subjective Assessment - 01/30/17 1527    Subjective Pt reports that her pain is "some better." She has been performing stretches at home with success. However she is unable to tolerate laying prone on elbows. No specific questions or concerns currently.    Limitations Walking;Sitting;House hold activities   How long can you walk comfortably? Pain with even minimal ambulation    Diagnostic tests SIJ injection - no pain, nothing on X-rays identified.    Patient Stated Goals To be able to walk and drive without pain.    Currently in Pain? No/denies   Pain Onset --         Treatment   Manual Therapy Performed extensive soft tissue mobilization over ischial tuberosities with relief reported by patient after completion; Gentle bilateral HS stretch 30s hold x 5 each; Isometric hip  extension with foot resting on shoulder of therapist and knee straight 10s hold x 5, 2 sets on each side;  E-stim HiVolt estim applied to patient tolerance (220V) over bilateral ischial tuberosities x 10 minutes, moist heat pack utilized concurrently, pt reports pain relief following intervention;                          PT Education - 01/30/17 1527    Education provided Yes   Education Details HEP reinforced   Person(s) Educated Patient   Methods Explanation   Comprehension Verbalized understanding             PT Long Term Goals - 01/26/17 1404      PT LONG TERM GOAL #1   Title Patient will report mODI of less than 30% disability to demonstrate improved tolerance for ADLs.    Baseline 48%   Time 6   Period Weeks   Status New     PT LONG TERM GOAL #2   Title Patient will report no increase in pain with driving for at least 30 minutes to complete ADLs    Baseline Pain with even short distance driving    Time 6   Period Weeks   Status New     PT LONG TERM GOAL #3   Title Patient will report no pain with hamstrings length  testing to demonstrate improved tolerance for fast walking.    Baseline Pain with slump, 90-90 testing    Time 6   Period Weeks   Status New     PT LONG TERM GOAL #4   Title Patient will report worst pain of no more than 3/10 to demonstrate improved tolerance for ADLs.    Baseline 7/10   Time 6   Period Weeks   Status New               Plan - 01/30/17 1527    Clinical Impression Statement Pt reports improvement in her pain since the initial evaluation. She is tender to palpation today near her ischial tubersoities which improve with STM. Able to reproduce pain today with both palpation and isometric hip extension all which would confirm a presumed diagnosis of HS strain/irritation. No HEP progression today. Follow-up as scheduled and will assess tolerance to HiVolt and isometric exercises performed during session today.     Clinical Presentation Stable   Clinical Decision Making Moderate   Rehab Potential Good   PT Frequency 2x / week   PT Duration 4 weeks   PT Treatment/Interventions Aquatic Therapy;Moist Heat;Iontophoresis 4mg /ml Dexamethasone;Gait training;Stair training;Neuromuscular re-education;Manual techniques;Taping;Dry needling;Therapeutic exercise;Therapeutic activities;Ultrasound;Cryotherapy;Electrical Stimulation   PT Next Visit Plan Soft tissue mobilization/e-stim for pain management at ischial tuberosity, isometric for HS if tolerated progressing to eccentrics   PT Home Exercise Plan Heat/stretching of HS and soft tissue mobilization over ischial tuberosity   Consulted and Agree with Plan of Care Patient      Patient will benefit from skilled therapeutic intervention in order to improve the following deficits and impairments:  Abnormal gait, Difficulty walking, Pain, Decreased range of motion, Decreased strength  Visit Diagnosis: Difficulty in walking, not elsewhere classified  Chronic bilateral low back pain with left-sided sciatica     Problem List Patient Active Problem List   Diagnosis Date Noted  . Hyperlipidemia, mixed 10/10/2016  . Stable angina pectoris ([place]) 10/10/2016  . Adaptation reaction 01/27/2015  . Back pain, chronic 01/27/2015  . CN (constipation) 01/27/2015  . Abnormal LFTs 01/27/2015  . Benign essential HTN 01/27/2015  . Gastro-esophageal reflux disease without esophagitis 01/27/2015  . Personal history of arthritis 01/27/2015  . Insomnia 01/27/2015  . Scoliosis 01/27/2015  . FOM (frequency of micturition) 01/27/2015   Phillips Grout PT, DPT   Makayla Confer 01/30/2017, 4:29 PM  [place] [facility] [address] [place], [address] Phone: [phone]   Fax:  [phone]  Name: Kristi Lee MRN: [id number] Date of Birth: October 17, 1955

## 2017-02-01 ENCOUNTER — Ambulatory Visit: Payer: 59 | Admitting: Physical Therapy

## 2017-02-07 ENCOUNTER — Ambulatory Visit: Payer: 59 | Admitting: Physical Therapy

## 2017-02-07 DIAGNOSIS — R262 Difficulty in walking, not elsewhere classified: Secondary | ICD-10-CM | POA: Diagnosis not present

## 2017-02-07 DIAGNOSIS — M5442 Lumbago with sciatica, left side: Secondary | ICD-10-CM

## 2017-02-07 DIAGNOSIS — S76319A Strain of muscle, fascia and tendon of the posterior muscle group at thigh level, unspecified thigh, initial encounter: Secondary | ICD-10-CM

## 2017-02-07 DIAGNOSIS — G8929 Other chronic pain: Secondary | ICD-10-CM

## 2017-02-08 NOTE — Therapy (Signed)
[place] [facility] [address] [place], [address] Phone: [phone]   Fax:  [phone]  Physical Therapy Treatment  Patient Details  Name: Kristi Lee MRN: [id number] Date of Birth: [birth year] No Data Recorded  Encounter Date: 02/07/2017      PT End of Session - 02/07/17 1554    Visit Number 3   Number of Visits 9   Date for PT Re-Evaluation 03/09/17   PT Start Time 2585   PT Stop Time 1630   PT Time Calculation (min) 38 min   Activity Tolerance Patient tolerated treatment well   Behavior During Therapy [facility] for tasks assessed/performed      Past Medical History:  Diagnosis Date  . Depression   . GERD (gastroesophageal reflux disease)   . Hypertension     Past Surgical History:  Procedure Laterality Date  . ABDOMINAL HYSTERECTOMY    . BACK SURGERY  10/2000  . BREAST EXCISIONAL BIOPSY Right 1990's   NEG  . FEMUR FRACTURE SURGERY Right 07/15/2009   Pertrochanteric femur fracture.  [name] HIP FRACTURE SURGERY Right   . REVISION TOTAL HIP ARTHROPLASTY Left     There were no vitals filed for this visit.      Subjective Assessment - 02/07/17 1553    Subjective Patient reports she has had some good days and some bad days, today is an in between day. Pain is localized to ischial tuberosity area bilaterally, but not in the hamstrings muscle bellies.    Limitations Walking;Sitting;House hold activities   How long can you walk comfortably? Pain with even minimal ambulation    Diagnostic tests SIJ injection - no pain, nothing on X-rays identified.    Patient Stated Goals To be able to walk and drive without pain.    Currently in Pain? Other (Comment)  Patient reports mild to moderate discomfort localized only over ischial tuberosity bilaterally      Observed gait pattern, no stark changes from eval noted   Soft tissue mobilization over ischial tuberosity bilaterally which she reports reduced her  symptoms after completion of several minutes of treatments bilaterally.   Isometric HS contractions (knee flexion) bilaterally at full extension, 45 degrees of flexion, and 90 degrees of flexion) x 8 reps x 2 sets of 5" holds in each position in prone   SLR/PNF HS stretching bilaterally to end range tolerance x 10 repetitions for 2 sets bilaterally (well tolerated with stretching sensation reported by patient) .                             PT Education - 02/08/17 1148    Education provided Yes   Education Details Continued stretching, heat, soft tissue work as part of [employer]) Educated Patient   Methods Explanation   Comprehension Verbalized understanding             PT Long Term Goals - 01/26/17 1404      PT LONG TERM GOAL #1   Title Patient will report mODI of less than 30% disability to demonstrate improved tolerance for ADLs.    Baseline 48%   Time 6   Period Weeks   Status New     PT LONG TERM GOAL #2   Title Patient will report no increase in pain with driving for at least 30 minutes to complete ADLs    Baseline Pain with even short distance driving  Time 6   Period Weeks   Status New     PT LONG TERM GOAL #3   Title Patient will report no pain with hamstrings length testing to demonstrate improved tolerance for fast walking.    Baseline Pain with slump, 90-90 testing    Time 6   Period Weeks   Status New     PT LONG TERM GOAL #4   Title Patient will report worst pain of no more than 3/10 to demonstrate improved tolerance for ADLs.    Baseline 7/10   Time 6   Period Weeks   Status New               Plan - 02/08/17 1149    Clinical Impression Statement Patient reports no pain to conclude session today after isometric knee flexion in prone as well as soft tissue mobilizations. She is responding quite well towards treatment directed at hamstring strains and would continue to benefit from increasing HS ROM and strength to  reduce subsequent exacerbations.    Clinical Presentation Stable   Clinical Decision Making Moderate   Rehab Potential Good   PT Frequency 2x / week   PT Duration 4 weeks   PT Treatment/Interventions Aquatic Therapy;Moist Heat;Iontophoresis 4mg /ml Dexamethasone;Gait training;Stair training;Neuromuscular re-education;Manual techniques;Taping;Dry needling;Therapeutic exercise;Therapeutic activities;Ultrasound;Cryotherapy;Electrical Stimulation   PT Next Visit Plan Soft tissue mobilization/e-stim for pain management at ischial tuberosity, isometric for HS if tolerated progressing to eccentrics   PT Home Exercise Plan Heat/stretching of HS and soft tissue mobilization over ischial tuberosity   Consulted and Agree with Plan of Care Patient      Patient will benefit from skilled therapeutic intervention in order to improve the following deficits and impairments:  Abnormal gait, Difficulty walking, Pain, Decreased range of motion, Decreased strength  Visit Diagnosis: Difficulty in walking, not elsewhere classified  Chronic bilateral low back pain with left-sided sciatica  Hamstring strain, unspecified laterality, initial encounter     Problem List Patient Active Problem List   Diagnosis Date Noted  . Hyperlipidemia, mixed 10/10/2016  . Stable angina pectoris ([place]) 10/10/2016  . Adaptation reaction 01/27/2015  . Back pain, chronic 01/27/2015  . CN (constipation) 01/27/2015  . Abnormal LFTs 01/27/2015  . Benign essential HTN 01/27/2015  . Gastro-esophageal reflux disease without esophagitis 01/27/2015  . Personal history of arthritis 01/27/2015  . Insomnia 01/27/2015  . Scoliosis 01/27/2015  . FOM (frequency of micturition) 01/27/2015   Royce Macadamia PT, DPT, CSCS    02/08/2017, 11:50 AM  [place] [facility] [address] [place], [address] Phone: [phone]   Fax:  [phone]  Name: Kristi Lee MRN: [id number] Date of Birth: 09/27/1955

## 2017-02-09 ENCOUNTER — Ambulatory Visit: Payer: 59 | Admitting: Physical Therapy

## 2017-02-09 DIAGNOSIS — R262 Difficulty in walking, not elsewhere classified: Secondary | ICD-10-CM | POA: Diagnosis not present

## 2017-02-09 DIAGNOSIS — S76319A Strain of muscle, fascia and tendon of the posterior muscle group at thigh level, unspecified thigh, initial encounter: Secondary | ICD-10-CM

## 2017-02-09 DIAGNOSIS — M5442 Lumbago with sciatica, left side: Secondary | ICD-10-CM

## 2017-02-09 DIAGNOSIS — G8929 Other chronic pain: Secondary | ICD-10-CM

## 2017-02-09 NOTE — Patient Instructions (Signed)
Soft tissue mobilization over L ischial tuberosity   Supine bridge walkouts x 5 for 3 sets   HS Curls 3x12 with blue t-band   HS Stretching in 90-90 -- full ROM bilaterally mild pain initially with L HS stretching, but reduced with repetitions and PNF contract relax

## 2017-02-10 NOTE — Therapy (Signed)
[place] [facility] [address] [place], [address] Phone: [phone]   Fax:  [phone]  Physical Therapy Treatment  Patient Details  Name: Kristi Lee MRN: [id number] Date of Birth: Jul 25, 1956 No Data Recorded  Encounter Date: 02/09/2017      PT End of Session - 02/10/17 1223    Visit Number 4   Number of Visits 9   Date for PT Re-Evaluation 03/09/17   PT Start Time 6962   PT Stop Time 1700   PT Time Calculation (min) 45 min   Activity Tolerance Patient tolerated treatment well   Behavior During Therapy [facility] for tasks assessed/performed      Past Medical History:  Diagnosis Date  . Depression   . GERD (gastroesophageal reflux disease)   . Hypertension     Past Surgical History:  Procedure Laterality Date  . ABDOMINAL HYSTERECTOMY    . BACK SURGERY  10/2000  . BREAST EXCISIONAL BIOPSY Right 1990's   NEG  . FEMUR FRACTURE SURGERY Right 07/15/2009   Pertrochanteric femur fracture.  [name] HIP FRACTURE SURGERY Right   . REVISION TOTAL HIP ARTHROPLASTY Left     There were no vitals filed for this visit.      Subjective Assessment - 02/09/17 1619    Subjective Patient reports she is now not having any pain while walking around now, her only pain now is when she is sitting in her seat, though this is much less than it was.    Limitations Walking;Sitting;House hold activities   How long can you walk comfortably? Pain with even minimal ambulation    Diagnostic tests SIJ injection - no pain, nothing on X-rays identified.    Patient Stated Goals To be able to walk and drive without pain.    Currently in Pain? Other (Comment)  She reports she can tell a little sensitivity in the R ischial tuberosity currently.        Soft tissue mobilization over L ischial tuberosity until patient reported substantial decrease in sensitivity over this area.   Supine bridge walkouts x 5 for 3 sets -- challenging,  but appropriate technique and HS fatigue  HS Curls 3x12 with blue t-band -- appropriately challenging, added to HEP  HS Stretching in 90-90 -- full ROM bilaterally mild pain initially with L HS stretching, but reduced with repetitions and PNF contract relax                           PT Education - 02/10/17 1223    Education provided Yes   Education Details Educated on HEP, will begin strengthening HS as ROM is normalizing.    Person(s) Educated Patient   Methods Explanation;Demonstration;Handout   Comprehension Returned demonstration;Verbalized understanding             PT Long Term Goals - 01/26/17 1404      PT LONG TERM GOAL #1   Title Patient will report mODI of less than 30% disability to demonstrate improved tolerance for ADLs.    Baseline 48%   Time 6   Period Weeks   Status New     PT LONG TERM GOAL #2   Title Patient will report no increase in pain with driving for at least 30 minutes to complete ADLs    Baseline Pain with even short distance driving    Time 6   Period Weeks   Status New  PT LONG TERM GOAL #3   Title Patient will report no pain with hamstrings length testing to demonstrate improved tolerance for fast walking.    Baseline Pain with slump, 90-90 testing    Time 6   Period Weeks   Status New     PT LONG TERM GOAL #4   Title Patient will report worst pain of no more than 3/10 to demonstrate improved tolerance for ADLs.    Baseline 7/10   Time 6   Period Weeks   Status New               Plan - 02/10/17 1224    Clinical Impression Statement Patient now appears to have soft tissue pain only now, her ROM with passive stretching has returned to [facility] and she is able to complete both concentric and eccentric HS strengthening progression. She was provided with HEP to address expected strength deficits with HS strain.    Clinical Presentation Stable   Clinical Decision Making Moderate   Rehab Potential Good   PT  Frequency 2x / week   PT Duration 4 weeks   PT Treatment/Interventions Aquatic Therapy;Moist Heat;Iontophoresis 4mg /ml Dexamethasone;Gait training;Stair training;Neuromuscular re-education;Manual techniques;Taping;Dry needling;Therapeutic exercise;Therapeutic activities;Ultrasound;Cryotherapy;Electrical Stimulation   PT Next Visit Plan Soft tissue mobilization/e-stim for pain management at ischial tuberosity, isometric for HS if tolerated progressing to eccentrics   PT Home Exercise Plan Heat/stretching of HS and soft tissue mobilization over ischial tuberosity   Consulted and Agree with Plan of Care Patient      Patient will benefit from skilled therapeutic intervention in order to improve the following deficits and impairments:  Abnormal gait, Difficulty walking, Pain, Decreased range of motion, Decreased strength  Visit Diagnosis: Difficulty in walking, not elsewhere classified  Chronic bilateral low back pain with left-sided sciatica  Hamstring strain, unspecified laterality, initial encounter     Problem List Patient Active Problem List   Diagnosis Date Noted  . Hyperlipidemia, mixed 10/10/2016  . Stable angina pectoris ([place]) 10/10/2016  . Adaptation reaction 01/27/2015  . Back pain, chronic 01/27/2015  . CN (constipation) 01/27/2015  . Abnormal LFTs 01/27/2015  . Benign essential HTN 01/27/2015  . Gastro-esophageal reflux disease without esophagitis 01/27/2015  . Personal history of arthritis 01/27/2015  . Insomnia 01/27/2015  . Scoliosis 01/27/2015  . FOM (frequency of micturition) 01/27/2015   Royce Macadamia PT, DPT, CSCS    02/10/2017, 12:26 PM  [facility] [address] [place], [address] Phone: [phone]   Fax:  [phone]  Name: LAQUANTA HUMMEL MRN: [id number] Date of Birth: [birth year]

## 2017-02-14 ENCOUNTER — Ambulatory Visit: Payer: 59 | Admitting: Physical Therapy

## 2017-02-17 ENCOUNTER — Encounter: Payer: 59 | Admitting: Physical Therapy

## 2017-02-20 ENCOUNTER — Encounter: Payer: 59 | Admitting: Physical Therapy

## 2017-02-21 ENCOUNTER — Encounter: Payer: 59 | Admitting: Physical Therapy

## 2017-02-23 ENCOUNTER — Ambulatory Visit: Payer: 59 | Attending: Orthopedic Surgery | Admitting: Physical Therapy

## 2017-02-23 DIAGNOSIS — G8929 Other chronic pain: Secondary | ICD-10-CM | POA: Diagnosis present

## 2017-02-23 DIAGNOSIS — M5442 Lumbago with sciatica, left side: Secondary | ICD-10-CM | POA: Insufficient documentation

## 2017-02-23 DIAGNOSIS — S76319A Strain of muscle, fascia and tendon of the posterior muscle group at thigh level, unspecified thigh, initial encounter: Secondary | ICD-10-CM | POA: Insufficient documentation

## 2017-02-23 DIAGNOSIS — R262 Difficulty in walking, not elsewhere classified: Secondary | ICD-10-CM | POA: Diagnosis present

## 2017-02-23 DIAGNOSIS — X58XXXA Exposure to other specified factors, initial encounter: Secondary | ICD-10-CM | POA: Diagnosis not present

## 2017-02-23 NOTE — Therapy (Signed)
[place] [facility] [address], [place], [address] Phone: [phone]   Fax:  [phone]  Physical Therapy Treatment  Patient Details  Name: Kristi Lee MRN: [id number] Date of Birth: 02/28/1956 No Data Recorded  Encounter Date: 02/23/2017      PT End of Session - 02/23/17 1642    Visit Number 5   Number of Visits 9   Date for PT Re-Evaluation 03/09/17   PT Start Time 1620   PT Stop Time 1640   PT Time Calculation (min) 20 min   Activity Tolerance Patient tolerated treatment well   Behavior During Therapy [facility] for tasks assessed/performed      Past Medical History:  Diagnosis Date  . Depression   . GERD (gastroesophageal reflux disease)   . Hypertension     Past Surgical History:  Procedure Laterality Date  . ABDOMINAL HYSTERECTOMY    . BACK SURGERY  10/2000  . BREAST EXCISIONAL BIOPSY Right 1990's   NEG  . FEMUR FRACTURE SURGERY Right 07/15/2009   Pertrochanteric femur fracture.  [name] HIP FRACTURE SURGERY Right   . REVISION TOTAL HIP ARTHROPLASTY Left     There were no vitals filed for this visit.      Subjective Assessment - 02/23/17 1643    Subjective Patient reports she is largely symptom free, she had a nice week at the beach. She reports she only has mild pain now with prolonged driving and sitting on firm surfaces.    Limitations Walking;Sitting;House hold activities   How long can you walk comfortably? Pain with even minimal ambulation    Diagnostic tests SIJ injection - no pain, nothing on X-rays identified.    Patient Stated Goals To be able to walk and drive without pain.    Currently in Pain? No/denies     LEFS - 62/80  90-90 testing - WNL no pain bilaterally   Slump test- WNL no pain bilaterally   Educated and observed patient complete bilateral heel slides with pillow cases in bridge position through partial range x 5 repetitions before she began to cramp. Provided for  HEP   Educated patient to sit closer to her driving pedals to reduce symptoms while driving.                             PT Education - 02/23/17 1642    Education provided Yes   Education Details Bring seat closer to pedal while driving, HEP progression.    Person(s) Educated Patient   Methods Explanation;Demonstration;Handout   Comprehension Verbalized understanding;Returned demonstration             PT Long Term Goals - 02/23/17 1640      PT LONG TERM GOAL #1   Title Patient will report mODI of less than 30% disability to demonstrate improved tolerance for ADLs.    Baseline 48% -- at follow up patient completed LEFS - 62/80   Time 6   Period Weeks   Status Achieved     PT LONG TERM GOAL #2   Title Patient will report no increase in pain with driving for at least 30 minutes to complete ADLs    Baseline Pain with even short distance driving    Time 6   Period Weeks   Status Achieved     PT LONG TERM GOAL #3   Title Patient will report no pain with hamstrings length testing to demonstrate  improved tolerance for fast walking.    Baseline Pain with slump, 90-90 testing    Time 6   Period Weeks   Status Achieved     PT LONG TERM GOAL #4   Title Patient will report worst pain of no more than 3/10 to demonstrate improved tolerance for ADLs.    Baseline 7/10   Time 6   Period Weeks   Status Achieved               Plan - 02/23/17 1641    Clinical Impression Statement Patient has met all goals, reports her only issues now are with prolonged driving and sitting on very firm surface. Educated patient to bring her seat closer for driving and will likely need padding for firm seats until tendoinopathy has subsided. Otherwise patient is able to self manage and has made excellent progress.    Clinical Presentation Stable   Clinical Decision Making Low   Rehab Potential Good   PT Frequency 2x / week   PT Duration 4 weeks   PT  Treatment/Interventions Aquatic Therapy;Moist Heat;Iontophoresis 74m/ml Dexamethasone;Gait training;Stair training;Neuromuscular re-education;Manual techniques;Taping;Dry needling;Therapeutic exercise;Therapeutic activities;Ultrasound;Cryotherapy;Electrical Stimulation   PT Next Visit Plan Soft tissue mobilization/e-stim for pain management at ischial tuberosity, isometric for HS if tolerated progressing to eccentrics   PT Home Exercise Plan Heat/stretching of HS and soft tissue mobilization over ischial tuberosity   Consulted and Agree with Plan of Care Patient      Patient will benefit from skilled therapeutic intervention in order to improve the following deficits and impairments:  Abnormal gait, Difficulty walking, Pain, Decreased range of motion, Decreased strength  Visit Diagnosis: Chronic bilateral low back pain with left-sided sciatica  Difficulty in walking, not elsewhere classified  Hamstring strain, unspecified laterality, initial encounter     Problem List Patient Active Problem List   Diagnosis Date Noted  . Hyperlipidemia, mixed 10/10/2016  . [place] 10/10/2016  . Adaptation reaction 01/27/2015  . Back pain, chronic 01/27/2015  . CN (constipation) 01/27/2015  . Abnormal LFTs 01/27/2015  . Benign essential HTN 01/27/2015  . Gastro-esophageal reflux disease without esophagitis 01/27/2015  . Personal history of arthritis 01/27/2015  . Insomnia 01/27/2015  . Scoliosis 01/27/2015  . FOM (frequency of micturition) 01/27/2015    [name], DPT, CSCS    02/23/2017, 4:43 PM  [facility] [address] [place] [address]Phone: [phone]  Fax:  [phone] Name: Kristi ZALENSKIMRN: [id number] of Birth: 8Nov 28, 1957

## 2017-02-27 ENCOUNTER — Ambulatory Visit: Payer: 59 | Admitting: Physical Therapy

## 2017-03-02 ENCOUNTER — Ambulatory Visit: Payer: 59 | Admitting: Physical Therapy

## 2017-03-14 ENCOUNTER — Encounter: Payer: 59 | Admitting: Physical Therapy

## 2017-03-16 ENCOUNTER — Encounter: Payer: 59 | Admitting: Physical Therapy

## 2017-03-27 ENCOUNTER — Encounter: Payer: 59 | Admitting: Physical Therapy

## 2017-05-18 ENCOUNTER — Other Ambulatory Visit: Payer: Self-pay | Admitting: Physician Assistant

## 2017-05-18 DIAGNOSIS — K219 Gastro-esophageal reflux disease without esophagitis: Secondary | ICD-10-CM

## 2017-05-23 ENCOUNTER — Other Ambulatory Visit: Payer: Self-pay | Admitting: Physician Assistant

## 2017-05-23 DIAGNOSIS — F5101 Primary insomnia: Secondary | ICD-10-CM

## 2017-05-31 ENCOUNTER — Ambulatory Visit (INDEPENDENT_AMBULATORY_CARE_PROVIDER_SITE_OTHER): Payer: 59 | Admitting: Podiatry

## 2017-05-31 ENCOUNTER — Ambulatory Visit (INDEPENDENT_AMBULATORY_CARE_PROVIDER_SITE_OTHER): Payer: 59

## 2017-05-31 VITALS — BP 121/73 | HR 84 | Temp 97.7°F | Resp 16

## 2017-05-31 DIAGNOSIS — M779 Enthesopathy, unspecified: Secondary | ICD-10-CM

## 2017-05-31 DIAGNOSIS — M7752 Other enthesopathy of left foot: Secondary | ICD-10-CM | POA: Diagnosis not present

## 2017-05-31 DIAGNOSIS — D3613 Benign neoplasm of peripheral nerves and autonomic nervous system of lower limb, including hip: Secondary | ICD-10-CM

## 2017-05-31 DIAGNOSIS — M778 Other enthesopathies, not elsewhere classified: Secondary | ICD-10-CM

## 2017-05-31 NOTE — Progress Notes (Signed)
  Subjective:  Patient ID: Kristi Lee, female    DOB: 06/24/1956,  MRN: [id number] HPI Chief Complaint  Patient presents with  . Foot Pain    Patient presents today for dull aching pain on bottom of left forefoot x 1 month.  She reports its more painful when walking and she has not treated with medications or anything    61 y.o. female presents with the above complaint.     Past Medical History:  Diagnosis Date  . Depression   . GERD (gastroesophageal reflux disease)   . Hypertension    Past Surgical History:  Procedure Laterality Date  . ABDOMINAL HYSTERECTOMY    . BACK SURGERY  10/2000  . BREAST EXCISIONAL BIOPSY Right 1990's   NEG  . FEMUR FRACTURE SURGERY Right 07/15/2009   Pertrochanteric femur fracture.  [name] HIP FRACTURE SURGERY Right   . REVISION TOTAL HIP ARTHROPLASTY Left     Current Outpatient Prescriptions:  .  aspirin EC 81 MG tablet, Take 81 mg by mouth daily., Disp: , Rfl:  .  atenolol (TENORMIN) 25 MG tablet, Take 1 tablet (25 mg total) by mouth daily., Disp: 90 tablet, Rfl: 1 .  celecoxib (CELEBREX) 200 MG capsule, Take 1 capsule (200 mg total) by mouth daily., Disp: 90 capsule, Rfl: 3 .  omeprazole (PRILOSEC) 20 MG capsule, TAKE 1 CAPSULE BY MOUTH  DAILY, Disp: 90 capsule, Rfl: 1 .  tolterodine (DETROL) 2 MG tablet, Take 1 tablet (2 mg total) by mouth 2 (two) times daily., Disp: 60 tablet, Rfl: 0 .  traZODone (DESYREL) 100 MG tablet, TAKE 1 TABLET BY MOUTH AT  BEDTIME, Disp: 90 tablet, Rfl: 1 .  triamterene-hydrochlorothiazide (MAXZIDE-25) 37.5-25 MG tablet, Take 1 tablet by mouth daily., Disp: 90 tablet, Rfl: 3 .  venlafaxine XR (EFFEXOR-XR) 75 MG 24 hr capsule, Take 1 capsule (75 mg total) by mouth daily., Disp: 90 capsule, Rfl: 1  No Known Allergies Review of Systems  All other systems reviewed and are negative.  Objective:   Vitals:   05/31/17 0859  BP: 121/73  Pulse: 84  Resp: 16  Temp: 97.7 F (36.5 C)    General: Well developed,  nourished, in no acute distress, alert and oriented x3   Dermatological: Skin is warm, dry and supple bilateral. Nails x 10 are well maintained; remaining integument appears unremarkable at this time. There are no open sores, no preulcerative lesions, no rash or signs of infection present.  Vascular: Dorsalis Pedis artery and Posterior Tibial artery pedal pulses are 2/4 bilateral with immedate capillary fill time. Pedal hair growth present. No varicosities and no lower extremity edema present bilateral.   Neruologic: Grossly intact via light touch bilateral. Vibratory intact via tuning fork bilateral. Protective threshold with Semmes Wienstein monofilament intact to all pedal sites bilateral. Patellar and Achilles deep tendon reflexes 2+ bilateral. No Babinski or clonus noted bilateral. Palpable Mulder's click to the third interdigital space left foot.  Musculoskeletal: No gross boney pedal deformities bilateral. No pain, crepitus, or limitation noted with foot and ankle range of motion bilateral. Muscular strength 5/5 in all groups tested bilateral. No significant orthopedic pain.  Gait: Unassisted, Nonantalgic.    Radiographs:  Radiographs 3 views left foot taken today demonstrate no osseous abnormalities. Mild osteopenia.  Assessment & Plan:   Assessment: Neuroma third interdigital space left.  Plan: Injected the left third interdigital space last neuroma with cortisone. Follow up with her in 4 weeks.     Max T. [place], [place]

## 2017-06-01 ENCOUNTER — Telehealth: Payer: Self-pay | Admitting: Podiatry

## 2017-06-01 NOTE — Telephone Encounter (Signed)
I saw Dr. Milinda Pointer yesterday and he was supposed to have prescribed me some medication. However, my pharmacy does not have any receipt of a prescription. You can either call me at work [phone] or on my cell phone [phone].

## 2017-06-01 NOTE — Telephone Encounter (Signed)
Can you please verify what medication was to be sent to pharmacy yesterday.  It is not stated in the office note.  Thanks

## 2017-06-02 ENCOUNTER — Other Ambulatory Visit: Payer: Self-pay | Admitting: Physician Assistant

## 2017-06-02 ENCOUNTER — Telehealth: Payer: Self-pay | Admitting: Podiatry

## 2017-06-02 DIAGNOSIS — I10 Essential (primary) hypertension: Secondary | ICD-10-CM

## 2017-06-02 DIAGNOSIS — R350 Frequency of micturition: Secondary | ICD-10-CM

## 2017-06-02 DIAGNOSIS — F4323 Adjustment disorder with mixed anxiety and depressed mood: Secondary | ICD-10-CM

## 2017-06-02 NOTE — Telephone Encounter (Signed)
I saw Dr. Milinda Pointer on Wednesday. He was supposed to prescribe a medication and it was not sent to my pharmacy. I called yesterday and was told I would get a call back which I never heard anything. As of yesterday evening they still don't have the Rx. Can someone please call me back at [phone]. Thank you.

## 2017-06-05 ENCOUNTER — Telehealth: Payer: Self-pay | Admitting: Podiatry

## 2017-06-05 ENCOUNTER — Other Ambulatory Visit: Payer: Self-pay | Admitting: Podiatry

## 2017-06-05 MED ORDER — MELOXICAM 15 MG PO TABS: 15.0000 mg | ORAL_TABLET | Freq: Every day | ORAL | 3 refills | Status: DC

## 2017-06-05 MED ORDER — METHYLPREDNISOLONE 4 MG PO TBPK
ORAL_TABLET | ORAL | 0 refills | Status: DC
Start: 2017-06-05 — End: 2017-06-05

## 2017-06-05 MED ORDER — METHYLPREDNISOLONE 4 MG PO TBPK: ORAL_TABLET | ORAL | 0 refills | Status: DC

## 2017-06-05 NOTE — Telephone Encounter (Addendum)
Dr Milinda Pointer sent in medrol dose pack this morning. Our [facility] are not working today and request that Marcy Siren or Janett Billow notify the pt-per [employer]

## 2017-06-05 NOTE — Telephone Encounter (Signed)
This is my 3rd phone call in regards to my visit on 17 October. At that time he was going to call me in some medication at [facility]. Since then, this is my third call. I have not heard anything back. If someone could please give me a call at [phone].

## 2017-06-05 NOTE — Telephone Encounter (Signed)
Inform patient the medication is at the pharmacy.  Start with medrol then after finished start mob

## 2017-06-05 NOTE — Addendum Note (Signed)
Addended by: Harriett Sine D on: 06/05/2017 02:19 PM   Modules accepted: Orders

## 2017-06-28 ENCOUNTER — Ambulatory Visit: Payer: 59 | Admitting: Podiatry

## 2017-10-24 ENCOUNTER — Other Ambulatory Visit: Payer: Self-pay | Admitting: Physician Assistant

## 2017-10-24 DIAGNOSIS — K219 Gastro-esophageal reflux disease without esophagitis: Secondary | ICD-10-CM

## 2017-10-27 ENCOUNTER — Ambulatory Visit (INDEPENDENT_AMBULATORY_CARE_PROVIDER_SITE_OTHER): Payer: Managed Care, Other (non HMO) | Admitting: Physician Assistant

## 2017-10-27 ENCOUNTER — Encounter: Payer: Self-pay | Admitting: Physician Assistant

## 2017-10-27 VITALS — BP 134/78 | HR 68 | Temp 98.3°F | Resp 16 | Ht 66.0 in | Wt 166.0 lb

## 2017-10-27 DIAGNOSIS — Z6826 Body mass index (BMI) 26.0-26.9, adult: Secondary | ICD-10-CM

## 2017-10-27 DIAGNOSIS — I10 Essential (primary) hypertension: Secondary | ICD-10-CM | POA: Diagnosis not present

## 2017-10-27 DIAGNOSIS — R945 Abnormal results of liver function studies: Secondary | ICD-10-CM

## 2017-10-27 DIAGNOSIS — Z1231 Encounter for screening mammogram for malignant neoplasm of breast: Secondary | ICD-10-CM | POA: Diagnosis not present

## 2017-10-27 DIAGNOSIS — Z0000 Encounter for general adult medical examination without abnormal findings: Secondary | ICD-10-CM

## 2017-10-27 DIAGNOSIS — E782 Mixed hyperlipidemia: Secondary | ICD-10-CM | POA: Diagnosis not present

## 2017-10-27 DIAGNOSIS — R7989 Other specified abnormal findings of blood chemistry: Secondary | ICD-10-CM

## 2017-10-27 DIAGNOSIS — Z1239 Encounter for other screening for malignant neoplasm of breast: Secondary | ICD-10-CM

## 2017-10-27 NOTE — Progress Notes (Signed)
Patient: Kristi Lee, Female    DOB: [birth year], 62 y.o.   MRN: [id number] Visit Date: 10/27/2017  Today's Provider: Mar Daring, PA-C   Chief Complaint  Patient presents with  . Annual Exam   Subjective:    Annual physical exam TAKEYSHA BONK is a 62 y.o. female who presents today for health maintenance and complete physical. She feels well. She reports exercising not regularly. She reports she is sleeping well.  Colonoscopy- 05/02/2011. Normal. Repeat 34yrs Mammogram- 12/15/2016. Normal.  Pap- 09/10/2014. Normal.  Tdap- 08/01/2012   Review of Systems  Constitutional: Negative.   HENT: Negative.   Eyes: Negative.   Respiratory: Negative.   Cardiovascular: Negative.   Gastrointestinal: Negative.   Endocrine: Negative.   Genitourinary: Negative.   Musculoskeletal: Negative.   Skin: Negative.   Allergic/Immunologic: Negative.   Neurological: Negative.   Hematological: Negative.   Psychiatric/Behavioral: Negative.     Social History      She  reports that  has never smoked. she has never used smokeless tobacco. She reports that she does not drink alcohol or use drugs.       Social History   Socioeconomic History  . Marital status: Married    Spouse name: None  . Number of children: 1  . Years of education: HS  . Highest education level: None  Social Needs  . Financial resource strain: None  . Food insecurity - worry: None  . Food insecurity - inability: None  . Transportation needs - medical: None  . Transportation needs - non-medical: None  Occupational History  . Occupation: Industrial/product designer: [employer]  Tobacco Use  . Smoking status: Never Smoker  . Smokeless tobacco: Never Used  Substance and Sexual Activity  . Alcohol use: No  . Drug use: No  . Sexual activity: None  Other Topics Concern  . None  Social History Narrative  . None    Past Medical History:  Diagnosis Date  . Depression   . GERD  (gastroesophageal reflux disease)   . Hypertension      Patient Active Problem List   Diagnosis Date Noted  . Hyperlipidemia, mixed 10/10/2016  . Stable angina pectoris ([place]) 10/10/2016  . Adaptation reaction 01/27/2015  . Back pain, chronic 01/27/2015  . CN (constipation) 01/27/2015  . Abnormal LFTs 01/27/2015  . Benign essential HTN 01/27/2015  . Gastro-esophageal reflux disease without esophagitis 01/27/2015  . Personal history of arthritis 01/27/2015  . Insomnia 01/27/2015  . Scoliosis 01/27/2015  . FOM (frequency of micturition) 01/27/2015    Past Surgical History:  Procedure Laterality Date  . ABDOMINAL HYSTERECTOMY    . BACK SURGERY  10/2000  . BREAST EXCISIONAL BIOPSY Right 1990's   NEG  . FEMUR FRACTURE SURGERY Right 07/15/2009   Pertrochanteric femur fracture.  [name] HIP FRACTURE SURGERY Right   . REVISION TOTAL HIP ARTHROPLASTY Left     Family History        Family Status  Relation Name Status  . Mother  Alive  . Father  Deceased  . Brother  Alive  . Sister  Alive  . Brother  Alive  . Daughter  Alive  . Neg Hx  (Not Specified)        Her family history includes Diabetes in her brother; Lung cancer in her father; Osteoarthritis in her mother. There is no history of Breast cancer.      No Known Allergies   Current Outpatient Medications:  .  aspirin EC 81 MG tablet, Take 81 mg by mouth daily., Disp: , Rfl:  .  atenolol (TENORMIN) 25 MG tablet, TAKE 1 TABLET BY MOUTH  DAILY, Disp: 90 tablet, Rfl: 1 .  celecoxib (CELEBREX) 200 MG capsule, TAKE 1 CAPSULE BY MOUTH  DAILY, Disp: 90 capsule, Rfl: 3 .  omeprazole (PRILOSEC) 20 MG capsule, TAKE 1 CAPSULE BY MOUTH  DAILY, Disp: 90 capsule, Rfl: 3 .  tolterodine (DETROL) 2 MG tablet, TAKE 1 TABLET BY MOUTH TWO  TIMES DAILY, Disp: 180 tablet, Rfl: 1 .  traZODone (DESYREL) 100 MG tablet, TAKE 1 TABLET BY MOUTH AT  BEDTIME, Disp: 90 tablet, Rfl: 1 .  triamterene-hydrochlorothiazide (MAXZIDE-25) 37.5-25 MG tablet,  TAKE 1 TABLET BY MOUTH  DAILY, Disp: 90 tablet, Rfl: 1 .  venlafaxine XR (EFFEXOR-XR) 75 MG 24 hr capsule, TAKE 1 CAPSULE BY MOUTH  DAILY, Disp: 90 capsule, Rfl: 1 .  meloxicam (MOBIC) 15 MG tablet, Take 1 tablet (15 mg total) by mouth daily. (Patient not taking: Reported on 10/27/2017), Disp: 30 tablet, Rfl: 3 .  methylPREDNISolone (MEDROL DOSEPAK) 4 MG TBPK tablet, 6 day dose pack - take as directed (Patient not taking: Reported on 10/27/2017), Disp: 21 tablet, Rfl: 0   Patient Care Team: Mar Daring, PA-C as PCP - General (Family Medicine)      Objective:   Vitals: BP 134/78 (BP Location: Left Arm, Patient Position: Sitting, Cuff Size: Normal)   Pulse 68   Temp 98.3 F (36.8 C)   Resp 16   Ht 5\' 6"  (1.676 m)   Wt 166 lb (75.3 kg)   BMI 26.79 kg/m    Vitals:   10/27/17 0916  BP: 134/78  Pulse: 68  Resp: 16  Temp: 98.3 F (36.8 C)  Weight: 166 lb (75.3 kg)  Height: 5\' 6"  (1.676 m)     Physical Exam  Constitutional: She is oriented to person, place, and time. She appears well-developed and well-nourished. No distress.  HENT:  Head: Normocephalic and atraumatic.  Right Ear: Hearing, tympanic membrane, external ear and ear canal normal.  Left Ear: Hearing, tympanic membrane, external ear and ear canal normal.  Nose: Nose normal.  Mouth/Throat: Uvula is midline, oropharynx is clear and moist and mucous membranes are normal. No oropharyngeal exudate.  Eyes: Conjunctivae and EOM are normal. Pupils are equal, round, and reactive to light. Right eye exhibits no discharge. Left eye exhibits no discharge. No scleral icterus.  Neck: Normal range of motion. Neck supple. No JVD present. Carotid bruit is not present. No tracheal deviation present. No thyromegaly present.  Cardiovascular: Normal rate, regular rhythm, normal heart sounds and intact distal pulses. Exam reveals no gallop and no friction rub.  No murmur heard. Pulmonary/Chest: Effort normal and breath sounds normal.  No respiratory distress. She has no wheezes. She has no rales. She exhibits no tenderness. Right breast exhibits no inverted nipple, no mass, no nipple discharge, no skin change and no tenderness. Left breast exhibits no inverted nipple, no mass, no nipple discharge, no skin change and no tenderness. Breasts are symmetrical.    Abdominal: Soft. Bowel sounds are normal. She exhibits no distension and no mass. There is no tenderness. There is no rebound and no guarding.  Musculoskeletal: Normal range of motion. She exhibits no edema or tenderness.  Lymphadenopathy:    She has no cervical adenopathy.  Neurological: She is alert and oriented to person, place, and time.  Skin: Skin is warm and dry. No rash noted. She is  not diaphoretic.  Psychiatric: She has a normal mood and affect. Her behavior is normal. Judgment and thought content normal.  Vitals reviewed.    Depression Screen PHQ 2/9 Scores 10/27/2017 10/26/2016 10/06/2015  PHQ - 2 Score 0 0 0  PHQ- 9 Score - 0 -      Assessment & Plan:     Routine Health Maintenance and Physical Exam  Exercise Activities and Dietary recommendations Goals    None      Immunization History  Administered Date(s) Administered  . Influenza Split 05/30/2013  . Tdap 08/01/2012    Health Maintenance  Topic Date Due  . INFLUENZA VACCINE  03/15/2017  . PAP SMEAR  09/10/2017  . MAMMOGRAM  12/16/2018  . COLONOSCOPY  05/01/2021  . TETANUS/TDAP  08/01/2022  . Hepatitis C Screening  Completed  . HIV Screening  Completed     Discussed health benefits of physical activity, and encouraged her to engage in regular exercise appropriate for her age and condition.    1. Annual physical exam Normal physical exam today. Will check labs as below and f/u pending lab results. If labs are stable and WNL she will not need to have these rechecked for one year at her next annual physical exam. She is to call the office in the meantime if she has any acute  issue, questions or concerns. - CBC w/Diff/Platelet - Comprehensive Metabolic Panel (CMET) - TSH - HgB A1c - Lipid Profile  2. Benign essential HTN Stable. Continue Triamterene-hctz 37.5-25mg  and atenolol 25mg . Will check labs as below and f/u pending results. - CBC w/Diff/Platelet - Comprehensive Metabolic Panel (CMET) - Lipid Profile  3. Abnormal LFTs H/O this. Will check labs as below and f/u pending results. - CBC w/Diff/Platelet - Comprehensive Metabolic Panel (CMET)  4. Hyperlipidemia, mixed Stable. Diet controlled. Will check labs as below and f/u pending results. - CBC w/Diff/Platelet - Comprehensive Metabolic Panel (CMET) - HgB A1c - Lipid Profile  5. Breast cancer screening Breast exam today was normal. There is no family history of breast cancer. She does perform regular self breast exams. Mammogram was ordered as below. Information for [facility] was given to patient so she may schedule her mammogram at her convenience. - MM Digital Screening; Future  6. BMI 26.0-26.9,adult Counseled patient on healthy lifestyle modifications including dieting and exercise.     Mar Daring, PA-C  [facility]

## 2017-10-27 NOTE — Patient Instructions (Signed)
Health Maintenance for Postmenopausal Women Menopause is a normal process in which your reproductive ability comes to an end. This process happens gradually over a span of months to years, usually between the ages of 22 and 9. Menopause is complete when you have missed 12 consecutive menstrual periods. It is important to talk with your health care provider about some of the most common conditions that affect postmenopausal women, such as heart disease, cancer, and bone loss (osteoporosis). Adopting a healthy lifestyle and getting preventive care can help to promote your health and wellness. Those actions can also lower your chances of developing some of these common conditions. What should I know about menopause? During menopause, you may experience a number of symptoms, such as:  Moderate-to-severe hot flashes.  Night sweats.  Decrease in sex drive.  Mood swings.  Headaches.  Tiredness.  Irritability.  Memory problems.  Insomnia.  Choosing to treat or not to treat menopausal changes is an individual decision that you make with your health care provider. What should I know about hormone replacement therapy and supplements? Hormone therapy products are effective for treating symptoms that are associated with menopause, such as hot flashes and night sweats. Hormone replacement carries certain risks, especially as you become older. If you are thinking about using estrogen or estrogen with progestin treatments, discuss the benefits and risks with your health care provider. What should I know about heart disease and stroke? Heart disease, heart attack, and stroke become more likely as you age. This may be due, in part, to the hormonal changes that your body experiences during menopause. These can affect how your body processes dietary fats, triglycerides, and cholesterol. Heart attack and stroke are both medical emergencies. There are many things that you can do to help prevent heart disease  and stroke:  Have your blood pressure checked at least every 1-2 years. High blood pressure causes heart disease and increases the risk of stroke.  If you are 53-22 years old, ask your health care provider if you should take aspirin to prevent a heart attack or a stroke.  Do not use any tobacco products, including cigarettes, chewing tobacco, or electronic cigarettes. If you need help quitting, ask your health care provider.  It is important to eat a healthy diet and maintain a healthy weight. ? Be sure to include plenty of vegetables, fruits, low-fat dairy products, and lean protein. ? Avoid eating foods that are high in solid fats, added sugars, or salt (sodium).  Get regular exercise. This is one of the most important things that you can do for your health. ? Try to exercise for at least 150 minutes each week. The type of exercise that you do should increase your heart rate and make you sweat. This is known as moderate-intensity exercise. ? Try to do strengthening exercises at least twice each week. Do these in addition to the moderate-intensity exercise.  Know your numbers.Ask your health care provider to check your cholesterol and your blood glucose. Continue to have your blood tested as directed by your health care provider.  What should I know about cancer screening? There are several types of cancer. Take the following steps to reduce your risk and to catch any cancer development as early as possible. Breast Cancer  Practice breast self-awareness. ? This means understanding how your breasts normally appear and feel. ? It also means doing regular breast self-exams. Let your health care provider know about any changes, no matter how small.  If you are 40  or older, have a clinician do a breast exam (clinical breast exam or CBE) every year. Depending on your age, family history, and medical history, it may be recommended that you also have a yearly breast X-ray (mammogram).  If you  have a family history of breast cancer, talk with your health care provider about genetic screening.  If you are at high risk for breast cancer, talk with your health care provider about having an MRI and a mammogram every year.  Breast cancer (BRCA) gene test is recommended for women who have family members with BRCA-related cancers. Results of the assessment will determine the need for genetic counseling and BRCA1 and for BRCA2 testing. BRCA-related cancers include these types: ? Breast. This occurs in males or females. ? Ovarian. ? Tubal. This may also be called fallopian tube cancer. ? Cancer of the abdominal or pelvic lining (peritoneal cancer). ? Prostate. ? Pancreatic.  Cervical, Uterine, and Ovarian Cancer Your health care provider may recommend that you be screened regularly for cancer of the pelvic organs. These include your ovaries, uterus, and vagina. This screening involves a pelvic exam, which includes checking for microscopic changes to the surface of your cervix (Pap test).  For women ages 21-65, health care providers may recommend a pelvic exam and a Pap test every three years. For women ages 79-65, they may recommend the Pap test and pelvic exam, combined with testing for human papilloma virus (HPV), every five years. Some types of HPV increase your risk of cervical cancer. Testing for HPV may also be done on women of any age who have unclear Pap test results.  Other health care providers may not recommend any screening for nonpregnant women who are considered low risk for pelvic cancer and have no symptoms. Ask your health care provider if a screening pelvic exam is right for you.  If you have had past treatment for cervical cancer or a condition that could lead to cancer, you need Pap tests and screening for cancer for at least 20 years after your treatment. If Pap tests have been discontinued for you, your risk factors (such as having a new sexual partner) need to be  reassessed to determine if you should start having screenings again. Some women have medical problems that increase the chance of getting cervical cancer. In these cases, your health care provider may recommend that you have screening and Pap tests more often.  If you have a family history of uterine cancer or ovarian cancer, talk with your health care provider about genetic screening.  If you have vaginal bleeding after reaching menopause, tell your health care provider.  There are currently no reliable tests available to screen for ovarian cancer.  Lung Cancer Lung cancer screening is recommended for adults 69-62 years old who are at high risk for lung cancer because of a history of smoking. A yearly low-dose CT scan of the lungs is recommended if you:  Currently smoke.  Have a history of at least 30 pack-years of smoking and you currently smoke or have quit within the past 15 years. A pack-year is smoking an average of one pack of cigarettes per day for one year.  Yearly screening should:  Continue until it has been 15 years since you quit.  Stop if you develop a health problem that would prevent you from having lung cancer treatment.  Colorectal Cancer  This type of cancer can be detected and can often be prevented.  Routine colorectal cancer screening usually begins at  age 42 and continues through age 45.  If you have risk factors for colon cancer, your health care provider may recommend that you be screened at an earlier age.  If you have a family history of colorectal cancer, talk with your health care provider about genetic screening.  Your health care provider may also recommend using home test kits to check for hidden blood in your stool.  A small camera at the end of a tube can be used to examine your colon directly (sigmoidoscopy or colonoscopy). This is done to check for the earliest forms of colorectal cancer.  Direct examination of the colon should be repeated every  5-10 years until age 71. However, if early forms of precancerous polyps or small growths are found or if you have a family history or genetic risk for colorectal cancer, you may need to be screened more often.  Skin Cancer  Check your skin from head to toe regularly.  Monitor any moles. Be sure to tell your health care provider: ? About any new moles or changes in moles, especially if there is a change in a mole's shape or color. ? If you have a mole that is larger than the size of a pencil eraser.  If any of your family members has a history of skin cancer, especially at a young age, talk with your health care provider about genetic screening.  Always use sunscreen. Apply sunscreen liberally and repeatedly throughout the day.  Whenever you are outside, protect yourself by wearing long sleeves, pants, a wide-brimmed hat, and sunglasses.  What should I know about osteoporosis? Osteoporosis is a condition in which bone destruction happens more quickly than new bone creation. After menopause, you may be at an increased risk for osteoporosis. To help prevent osteoporosis or the bone fractures that can happen because of osteoporosis, the following is recommended:  If you are 46-71 years old, get at least 1,000 mg of calcium and at least 600 mg of vitamin D per day.  If you are older than age 55 but younger than age 65, get at least 1,200 mg of calcium and at least 600 mg of vitamin D per day.  If you are older than age 54, get at least 1,200 mg of calcium and at least 800 mg of vitamin D per day.  Smoking and excessive alcohol intake increase the risk of osteoporosis. Eat foods that are rich in calcium and vitamin D, and do weight-bearing exercises several times each week as directed by your health care provider. What should I know about how menopause affects my mental health? Depression may occur at any age, but it is more common as you become older. Common symptoms of depression  include:  Low or sad mood.  Changes in sleep patterns.  Changes in appetite or eating patterns.  Feeling an overall lack of motivation or enjoyment of activities that you previously enjoyed.  Frequent crying spells.  Talk with your health care provider if you think that you are experiencing depression. What should I know about immunizations? It is important that you get and maintain your immunizations. These include:  Tetanus, diphtheria, and pertussis (Tdap) booster vaccine.  Influenza every year before the flu season begins.  Pneumonia vaccine.  Shingles vaccine.  Your health care provider may also recommend other immunizations. This information is not intended to replace advice given to you by your health care provider. Make sure you discuss any questions you have with your health care provider. Document Released: 09/23/2005  Document Revised: 02/19/2016 Document Reviewed: 05/05/2015 Elsevier Interactive Patient Education  2018 [employer]  

## 2017-10-28 LAB — HEMOGLOBIN A1C
Est. average glucose Bld gHb Est-mCnc: 120 mg/dL
Hgb A1c MFr Bld: 5.8 % — ABNORMAL HIGH (ref 4.8–5.6)

## 2017-10-28 LAB — COMPREHENSIVE METABOLIC PANEL
ALT: 18 IU/L (ref 0–32)
AST: 20 IU/L (ref 0–40)
Albumin/Globulin Ratio: 1.8 (ref 1.2–2.2)
Albumin: 4.4 g/dL (ref 3.6–4.8)
Alkaline Phosphatase: 137 IU/L — ABNORMAL HIGH (ref 39–117)
BUN/Creatinine Ratio: 17 (ref 12–28)
BUN: 19 mg/dL (ref 8–27)
Bilirubin Total: 0.4 mg/dL (ref 0.0–1.2)
CO2: 24 mmol/L (ref 20–29)
Calcium: 9.5 mg/dL (ref 8.7–10.3)
Chloride: 102 mmol/L (ref 96–106)
Creatinine, Ser: 1.13 mg/dL — ABNORMAL HIGH (ref 0.57–1.00)
GFR calc Af Amer: 60 mL/min/{1.73_m2} (ref >59)
GFR calc non Af Amer: 52 mL/min/{1.73_m2} — ABNORMAL LOW (ref >59)
Globulin, Total: 2.5 g/dL (ref 1.5–4.5)
Glucose: 91 mg/dL (ref 65–99)
Potassium: 3.6 mmol/L (ref 3.5–5.2)
Sodium: 143 mmol/L (ref 134–144)
Total Protein: 6.9 g/dL (ref 6.0–8.5)

## 2017-10-28 LAB — CBC WITH DIFFERENTIAL/PLATELET
Basophils Absolute: 0.1 10*3/uL (ref 0.0–0.2)
Basos: 1 %
EOS (ABSOLUTE): 0.2 10*3/uL (ref 0.0–0.4)
Eos: 2 %
Hematocrit: 38.9 % (ref 34.0–46.6)
Hemoglobin: 13 g/dL (ref 11.1–15.9)
Immature Grans (Abs): 0 10*3/uL (ref 0.0–0.1)
Immature Granulocytes: 0 %
Lymphocytes Absolute: 2.1 10*3/uL (ref 0.7–3.1)
Lymphs: 23 %
MCH: 27.8 pg (ref 26.6–33.0)
MCHC: 33.4 g/dL (ref 31.5–35.7)
MCV: 83 fL (ref 79–97)
Monocytes Absolute: 1 10*3/uL — ABNORMAL HIGH (ref 0.1–0.9)
Monocytes: 10 %
Neutrophils Absolute: 5.9 10*3/uL (ref 1.4–7.0)
Neutrophils: 64 %
Platelets: 222 10*3/uL (ref 150–379)
RBC: 4.67 x10E6/uL (ref 3.77–5.28)
RDW: 15.4 % (ref 12.3–15.4)
WBC: 9.2 10*3/uL (ref 3.4–10.8)

## 2017-10-28 LAB — LIPID PANEL
Chol/HDL Ratio: 3.2 ratio (ref 0.0–4.4)
Cholesterol, Total: 196 mg/dL (ref 100–199)
HDL: 61 mg/dL (ref >39)
LDL Calculated: 111 mg/dL — ABNORMAL HIGH (ref 0–99)
Triglycerides: 118 mg/dL (ref 0–149)
VLDL Cholesterol Cal: 24 mg/dL (ref 5–40)

## 2017-10-28 LAB — TSH: TSH: 1.92 u[IU]/mL (ref 0.450–4.500)

## 2017-10-30 ENCOUNTER — Other Ambulatory Visit: Payer: Self-pay | Admitting: Physician Assistant

## 2017-10-30 DIAGNOSIS — F5101 Primary insomnia: Secondary | ICD-10-CM

## 2017-10-30 DIAGNOSIS — R350 Frequency of micturition: Secondary | ICD-10-CM

## 2017-10-31 ENCOUNTER — Telehealth: Payer: Self-pay

## 2017-10-31 NOTE — Telephone Encounter (Signed)
-----   Message from Mar Daring, PA-C sent at 10/31/2017  9:39 AM EDT ----- All labs are within normal limits and/or stable.  Thanks! -JB

## 2017-10-31 NOTE — Telephone Encounter (Signed)
LMTCB and that she can also view the message and results through my chart. Any questions or concerns to call back.  Thanks,  -Vinaya Sancho

## 2017-10-31 NOTE — Telephone Encounter (Signed)
Viewed by Thelma Comp on 10/31/2017 11:23 AM

## 2017-12-25 ENCOUNTER — Encounter: Payer: Self-pay | Admitting: Podiatry

## 2017-12-25 ENCOUNTER — Ambulatory Visit: Payer: 59 | Admitting: Podiatry

## 2017-12-25 DIAGNOSIS — G5782 Other specified mononeuropathies of left lower limb: Secondary | ICD-10-CM

## 2017-12-25 DIAGNOSIS — G5762 Lesion of plantar nerve, left lower limb: Secondary | ICD-10-CM | POA: Diagnosis not present

## 2017-12-25 DIAGNOSIS — D3613 Benign neoplasm of peripheral nerves and autonomic nervous system of lower limb, including hip: Secondary | ICD-10-CM | POA: Diagnosis not present

## 2017-12-25 NOTE — Progress Notes (Signed)
She presents today states that started to get puffy again she refers to the third interdigital space of the left foot.  She states it was doing great for quite a while and then the past couple of weeks is really started to bother me.  Objective: Vital signs are stable alert and oriented x3.  Pulses are palpable.  There is no erythema some mild edema no cellulitis drainage or odor no pain on palpation.  Palpable Mulder's click third interdigital space left foot.  Assessment: Neuroma third interdigital space left foot.  Plan: Injected the area today with just under 20 mg of Kenalog 5 mg Marcaine on maximal tenderness left a follow-up with her in 1 month

## 2018-01-11 ENCOUNTER — Ambulatory Visit
Admission: RE | Admit: 2018-01-11 | Discharge: 2018-01-11 | Disposition: A | Discharge status: Home / Self Care | Payer: Managed Care, Other (non HMO) | Source: Ambulatory Visit | Attending: Physician Assistant | Admitting: Physician Assistant

## 2018-01-11 DIAGNOSIS — Z1239 Encounter for other screening for malignant neoplasm of breast: Secondary | ICD-10-CM

## 2018-01-11 DIAGNOSIS — Z1231 Encounter for screening mammogram for malignant neoplasm of breast: Secondary | ICD-10-CM | POA: Insufficient documentation

## 2018-01-12 ENCOUNTER — Telehealth: Payer: Self-pay

## 2018-01-12 NOTE — Telephone Encounter (Signed)
-----   Message from Mar Daring, PA-C sent at 01/12/2018 11:17 AM EDT ----- Normal mammogram. Repeat screening in one year.

## 2018-01-12 NOTE — Telephone Encounter (Signed)
Patient advised as below.  

## 2018-01-15 ENCOUNTER — Other Ambulatory Visit: Payer: Self-pay | Admitting: Physician Assistant

## 2018-01-15 DIAGNOSIS — I10 Essential (primary) hypertension: Secondary | ICD-10-CM

## 2018-01-28 ENCOUNTER — Other Ambulatory Visit: Payer: Self-pay | Admitting: Physician Assistant

## 2018-01-28 DIAGNOSIS — I10 Essential (primary) hypertension: Secondary | ICD-10-CM

## 2018-01-29 ENCOUNTER — Other Ambulatory Visit: Payer: Self-pay | Admitting: Physician Assistant

## 2018-01-29 DIAGNOSIS — F4323 Adjustment disorder with mixed anxiety and depressed mood: Secondary | ICD-10-CM

## 2018-01-31 ENCOUNTER — Ambulatory Visit: Payer: Managed Care, Other (non HMO) | Admitting: Podiatry

## 2018-01-31 ENCOUNTER — Encounter: Payer: Self-pay | Admitting: Podiatry

## 2018-01-31 DIAGNOSIS — D3613 Benign neoplasm of peripheral nerves and autonomic nervous system of lower limb, including hip: Secondary | ICD-10-CM

## 2018-01-31 NOTE — Progress Notes (Signed)
She presents today for follow-up of neuroma left foot states that still hurts every now and then and is still swells and I do not think the last injection helped hardly any she refers to a cortisone injection.  Objective: Vital signs are stable she is alert and oriented x3.  Palpable Mulder's click third interspace of the left foot pulses are palpable neurologic sensorium is intact.  Assessment: Neuroma third interspace left.  Plan: Injected her first dose of dehydrated alcohol today to cc of 4% dehydrated alcohol sterile Betadine skin prep tolerated procedure well follow-up with her in 4 weeks

## 2018-03-07 ENCOUNTER — Ambulatory Visit: Payer: Managed Care, Other (non HMO) | Admitting: Podiatry

## 2018-03-12 ENCOUNTER — Ambulatory Visit: Payer: Managed Care, Other (non HMO) | Admitting: Podiatry

## 2018-03-12 ENCOUNTER — Encounter: Payer: Self-pay | Admitting: Podiatry

## 2018-03-12 DIAGNOSIS — G5762 Lesion of plantar nerve, left lower limb: Secondary | ICD-10-CM | POA: Diagnosis not present

## 2018-03-12 DIAGNOSIS — G5782 Other specified mononeuropathies of left lower limb: Secondary | ICD-10-CM

## 2018-03-12 NOTE — Progress Notes (Signed)
She presents today for follow-up of neuroma third interdigital space of her left foot.  States that he was doing great but now started to hurt again she had just completed her first dehydrated alcohol.  Objective: Vital signs are stable alert and oriented x3.  Pulses are palpable.  Palpable Mulder's click third interdigital space of the left foot is painful.  Assessment: Neuroma third interspace left.  Plan: After sterile Betadine skin prep injected 2 cc dehydrated alcohol third interspace left foot.  Tolerated procedure well without comp occasions.

## 2018-04-02 ENCOUNTER — Ambulatory Visit: Payer: Managed Care, Other (non HMO) | Admitting: Podiatry

## 2018-04-23 ENCOUNTER — Other Ambulatory Visit: Payer: Self-pay | Admitting: Physician Assistant

## 2018-04-23 DIAGNOSIS — I10 Essential (primary) hypertension: Secondary | ICD-10-CM

## 2018-04-23 DIAGNOSIS — F5101 Primary insomnia: Secondary | ICD-10-CM

## 2018-06-11 ENCOUNTER — Other Ambulatory Visit: Payer: Self-pay | Admitting: Physician Assistant

## 2018-06-11 DIAGNOSIS — F4323 Adjustment disorder with mixed anxiety and depressed mood: Secondary | ICD-10-CM

## 2018-06-11 DIAGNOSIS — R350 Frequency of micturition: Secondary | ICD-10-CM

## 2018-07-01 ENCOUNTER — Other Ambulatory Visit: Payer: Self-pay | Admitting: Physician Assistant

## 2018-07-01 DIAGNOSIS — I10 Essential (primary) hypertension: Secondary | ICD-10-CM

## 2018-09-04 ENCOUNTER — Other Ambulatory Visit: Payer: Self-pay | Admitting: Physician Assistant

## 2018-09-04 DIAGNOSIS — K219 Gastro-esophageal reflux disease without esophagitis: Secondary | ICD-10-CM

## 2018-11-07 ENCOUNTER — Other Ambulatory Visit: Payer: Self-pay | Admitting: Physician Assistant

## 2018-11-07 DIAGNOSIS — F4323 Adjustment disorder with mixed anxiety and depressed mood: Secondary | ICD-10-CM

## 2018-11-07 MED ORDER — VENLAFAXINE HCL ER 75 MG PO CP24: 75.0000 mg | ORAL_CAPSULE | Freq: Every day | ORAL | 1 refills | Status: DC

## 2018-11-07 NOTE — Telephone Encounter (Signed)
Please Review

## 2018-11-07 NOTE — Telephone Encounter (Signed)
[facility] faxed refill request for the following medications:  venlafaxine XR (EFFEXOR-XR) 75 MG 24 hr capsule    Please advise.

## 2018-11-09 MED ORDER — VENLAFAXINE HCL ER 75 MG PO CP24: 75.0000 mg | ORAL_CAPSULE | Freq: Every day | ORAL | 1 refills | Status: DC

## 2018-11-09 NOTE — Telephone Encounter (Signed)
We received another request for this medication.  Looks like it went to [facility] and should have went to [facility].  Can we send to the correct pharmacy.

## 2018-11-09 NOTE — Addendum Note (Signed)
Addended by: Shawna Orleans on: 11/09/2018 11:29 AM   Modules accepted: Orders

## 2018-11-12 ENCOUNTER — Telehealth: Payer: Self-pay | Admitting: Physician Assistant

## 2018-11-12 MED ORDER — CELECOXIB 200 MG PO CAPS: 200.0000 mg | ORAL_CAPSULE | Freq: Every day | ORAL | 0 refills | Status: DC

## 2018-11-12 NOTE — Telephone Encounter (Signed)
Patient advised as directed. °

## 2018-11-12 NOTE — Telephone Encounter (Signed)
Last office visit 10/27/2017

## 2018-11-12 NOTE — Telephone Encounter (Signed)
Sent 3 months to [facility]. Patient needs CPE once through COVID 19

## 2018-11-12 NOTE — Telephone Encounter (Signed)
[facility] faxed refill request for the following medications:  celecoxib (CELEBREX) 200 MG capsule   Please advise.

## 2018-11-22 ENCOUNTER — Other Ambulatory Visit: Payer: Self-pay | Admitting: Physician Assistant

## 2018-11-22 DIAGNOSIS — F5101 Primary insomnia: Secondary | ICD-10-CM

## 2018-12-18 ENCOUNTER — Other Ambulatory Visit: Payer: Self-pay | Admitting: Physician Assistant

## 2018-12-18 DIAGNOSIS — R350 Frequency of micturition: Secondary | ICD-10-CM

## 2018-12-18 DIAGNOSIS — I10 Essential (primary) hypertension: Secondary | ICD-10-CM

## 2018-12-18 MED ORDER — TOLTERODINE TARTRATE 2 MG PO TABS: 2.0000 mg | ORAL_TABLET | Freq: Two times a day (BID) | ORAL | 1 refills | Status: DC

## 2018-12-18 NOTE — Telephone Encounter (Signed)
[facility] faxed refill request for the following medications:  tolterodine (DETROL) 2 MG tablet    Please advise.

## 2018-12-28 ENCOUNTER — Other Ambulatory Visit: Payer: Self-pay | Admitting: Physician Assistant

## 2018-12-28 DIAGNOSIS — I10 Essential (primary) hypertension: Secondary | ICD-10-CM

## 2018-12-28 NOTE — Telephone Encounter (Signed)
Please review

## 2019-01-09 ENCOUNTER — Ambulatory Visit: Payer: Managed Care, Other (non HMO) | Attending: Physician Assistant

## 2019-01-09 ENCOUNTER — Other Ambulatory Visit: Payer: Self-pay

## 2019-01-09 DIAGNOSIS — M6281 Muscle weakness (generalized): Secondary | ICD-10-CM | POA: Diagnosis present

## 2019-01-09 DIAGNOSIS — G8929 Other chronic pain: Secondary | ICD-10-CM

## 2019-01-09 DIAGNOSIS — M25512 Pain in left shoulder: Secondary | ICD-10-CM | POA: Insufficient documentation

## 2019-01-09 DIAGNOSIS — M25612 Stiffness of left shoulder, not elsewhere classified: Secondary | ICD-10-CM | POA: Diagnosis present

## 2019-01-09 NOTE — Patient Instructions (Signed)
Med[employer] Access Code: [email or web address]  Standing Shoulder External and Internal Rotation AROM   10x3  Scapular retraction 10x3 with 5 second holds

## 2019-01-09 NOTE — Therapy (Signed)
[facility] [address], [place], [address] Phone: [phone]   Fax:  [phone]  Physical Therapy Evaluation  Patient Details  Name: Kristi Lee MRN: [id number] Date of Birth: 1956/06/03 Referring Provider (PT): Vance Peper, PA   Encounter Date: 01/09/2019  PT End of Session - 01/09/19 1703    Visit Number  1    Number of Visits  13    Date for PT Re-Evaluation  02/21/19    PT Start Time  1703    PT Stop Time  1752    PT Time Calculation (min)  49 min    Activity Tolerance  Patient tolerated treatment well    Behavior During Therapy  [facility] for tasks assessed/performed       Past Medical History:  Diagnosis Date  . Depression   . GERD (gastroesophageal reflux disease)   . Hypertension     Past Surgical History:  Procedure Laterality Date  . ABDOMINAL HYSTERECTOMY    . BACK SURGERY  10/2000  . BREAST EXCISIONAL BIOPSY Right 1990's   NEG  . FEMUR FRACTURE SURGERY Right 07/15/2009   Pertrochanteric femur fracture.  [name] HIP FRACTURE SURGERY Right   . REVISION TOTAL HIP ARTHROPLASTY Left     There were no vitals filed for this visit.   Subjective Assessment - 01/09/19 1708    Subjective  L shoulder pain (posterior lateral shoulder blade, shoulder and arm). 0/10 currently, 4/10 at worst for the past month.     Pertinent History  L shoulder pain. Has been hurting on and off. Had a 3rd shot of cortisone in March 2020 which helped for 3-4 weeks but pain returned this time. Found some exercises online to do for her shoulder. Pain improved but can't lift anything up with her L hand.  Pt is R hand dominant.  Pain began February 2020, gradual onset. Pain is better since onset in February 2020.  Pain with reaching up or donning and doffing clothes. No pain with reaching behind. Currenlty able to lie down on her L side as well.  Was not able to perform aforementioned activities prior to her cortisone shot.      Patient Stated Goals  Be able to lift stuff up with her L UE    Currently in Pain?  No/denies    Pain Score  0-No pain    Pain Orientation  Left    Pain Descriptors / Indicators  Tightness    Pain Type  Chronic pain    Pain Onset  More than a month ago    Pain Frequency  Occasional    Aggravating Factors   taking off a shirt, reaching up,     Pain Relieving Factors  Cortisone shot, rest         [facility] PT Assessment - 01/09/19 1702      Assessment   Medical Diagnosis  L shoulder subacromial bursitis    Referring Provider (PT)  Vance Peper, PA    Onset Date/Surgical Date  11/06/18   Date PT referral signed. Chronic condition   Prior Therapy  No known PT for current condition. Pt participated in PT before for back pain with positive results      Precautions   Precaution Comments  no known precautions      Restrictions   Other Position/Activity Restrictions  no known restrictions      Balance Screen   Has the patient fallen in the past 6 months  No    Has the patient had a decrease in activity level because of a fear of falling?   No    Is the patient reluctant to leave their home because of a fear of falling?   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 L shoulder ER with manual resistance: worse with shoulder distraction, no difference with axial compression.       Posture/Postural Control   Posture Comments  protracted neck, B protracted shoulders R > L, R lateral shift, L shoulder higher, kyphosis      AROM   Right Shoulder Flexion  133 Degrees    Right Shoulder ABduction  123 Degrees   with shoulder discomfort   Left Shoulder Flexion  117 Degrees   with posterior lateral shoulder pain; 135 degrees  AAROM   Left Shoulder ABduction  84 Degrees   with lateral shoulder pain; 126 degrees AAROM   Cervical Flexion  WFL    Cervical Extension  WFL    Cervical - Right Side Bend  WFL    Cervical - Left Side Bend  WFL    Cervical - Right Rotation  WFL    Cervical - Left Rotation  [facility]      Strength   Right Shoulder Flexion  4/5    Right Shoulder ABduction  4/5    Right Shoulder Internal Rotation  4+/5    Right Shoulder External Rotation  4/5    Left Shoulder Flexion  4/5    Left Shoulder ABduction  4-/5    Left Shoulder Internal Rotation  4+/5    Left Shoulder External Rotation  3+/5   with shoulder pain               Objective measurements completed on examination: See above findings.     The patient has been informed of current processes in place at [facility] to protect patients from Covid-19 exposure including social distancing, schedule modifications, and new cleaning procedures. After discussing their particular risk with a therapist based on the patient's personal risk factors, the patient has decided to proceed with in-person therapy.   Some of the exercises she has performed for her R shoulder prior to PT: supine shoulder flexion, cross arm stretch     No latex band allergies Blood pressure controlled per pt.   Has steel rods along spine from low back to upper thoracic spine due to scoliosis since she was 63 years old    [place] Access Code: [email or web address]  Therapeutic exercise  Seated B scapular retraction 10x5 seconds for 2 sets  Seated L shoulder ER AROM with scapular retraction 10x3   128 degrees L shoulder flexion AROM after aforementioned exercises with less pain   Seated L shoulder extension isometrics 10x5 seconds manually resisted for 2 sets  Reviewed HEP. Pt demonstrated and verbalized understanding.    Improved exercise technique, movement at target joints, use of target muscles after mod verbal, visual, tactile cues.    Decreased L  shoulder pain with flexion after session.    Patient is a 63 year old female who came to physical therapy secondary to L shoulder pain. She also presents with decreased  L shoulder AROM, poor posture, scapular weakness, poor glenohumeral control, infraspinatus muscle weakness, L deltoid muscle weakness, positive special tests suggesting rotator cuff involvement and impingement, and difficulty performing functional tasks such as lifting items with her L UE. Pt will benefit from skilled physical therapy services to address the aforementioned deficits.            PT Education - 01/09/19 2103    Education provided  Yes    Education Details  ther-ex, HEP, plan of care    Person(s) Educated  Patient    Methods  Explanation;Demonstration;Tactile cues;Verbal cues    Comprehension  Returned demonstration;Verbalized understanding       PT Short Term Goals - 01/09/19 2053      PT SHORT TERM GOAL #1   Title  Patient will be independent with her HEP to decrease shoulder pain, improve ability to lift items with her L UE.     Baseline  Pt has started her HEP (01/09/2019)    Time  3    Period  Weeks    Status  New    Target Date  01/31/19        PT Long Term Goals - 01/09/19 2054      PT LONG TERM GOAL #1   Title  Patient will have a decrease in L shoulder pain to 2/10 or less at worst to promote ability to reach, don and doff clothing, and lift items with her L UE.     Baseline  4/10 L shoulder pain at worst for the past month (01/09/2019)    Time  6    Period  Weeks    Status  New    Target Date  02/21/19      PT LONG TERM GOAL #2   Title  Patient will improve L shoulder flexion AROM to at least 130 degrees, and abduction AROM to at least 120 degrees to promote ability to reach.     Baseline  L shoulder AROM: 117 degrees flexion, 84 degrees abduction (01/09/2019)    Time  6    Period  Weeks    Status  New    Target Date  02/21/19      PT LONG TERM GOAL #3   Title  Patient will  improve L shoulder strength by at least 1/2 MMT grade to promote ability to reach, don and doff clothes, and lift items with her L UE more comfortably.     Baseline  L shoulder: 4/5 flexion, 4-/5 abduction, 4+/5 IR, 3+/5 ER (01/09/2019)    Time  6    Period  Weeks    Status  New    Target Date  02/21/19             Plan - 01/09/19 2047    Clinical Impression Statement  Patient is a 63 year old female who came to physical therapy secondary to L shoulder pain. She also presents with decreased L shoulder AROM, poor posture, scapular weakness, poor glenohumeral control, infraspinatus muscle weakness, L deltoid muscle weakness, positive special tests suggesting rotator cuff involvement and impingement, and difficulty performing functional tasks such as lifting items with her L UE. Pt will benefit from skilled physical therapy services to address the aforementioned deficits.     Personal Factors and Comorbidities  Age;Time since onset of injury/illness/exacerbation;Profession;Fitness   profession: desk job/poor posture   Examination-Activity Limitations  Carry;Lift;Dressing;Reach Overhead;Hygiene/Grooming    Stability/Clinical Decision Making  Stable/Uncomplicated    Clinical Decision Making  Low    Rehab Potential  Good    PT Frequency  2x / week    PT Duration  6 weeks    PT Treatment/Interventions  Aquatic Therapy;Electrical Stimulation;Iontophoresis 4mg /ml Dexamethasone;Ultrasound;Therapeutic activities;Therapeutic exercise;Neuromuscular re-education;Patient/family education;Manual techniques;Passive range of motion;Dry needling;Spinal Manipulations;Joint Manipulations   manipulations if appropriate   PT Next Visit Plan  scapular and infraspinatus strengthening, glenohumeral control, manual techniques, modalities PRN      PT Home Exercise Plan  Med[employer] Access Code: [email or web address]    Consulted and Agree with Plan of Care  Patient       Patient will benefit from skilled therapeutic  intervention in order to improve the following deficits and impairments:  Decreased range of motion, Decreased strength, Impaired UE functional use, Improper body mechanics, Postural dysfunction, Pain  Visit Diagnosis: Chronic left shoulder pain - Plan: PT plan of care cert/re-cert  Muscle weakness (generalized) - Plan: PT plan of care cert/re-cert  Stiffness of left shoulder joint - Plan: PT plan of care cert/re-cert     Problem List Patient Active Problem List   Diagnosis Date Noted  . Hyperlipidemia, mixed 10/10/2016  . Stable angina pectoris ([place]) 10/10/2016  . Adaptation reaction 01/27/2015  . Back pain, chronic 01/27/2015  . CN (constipation) 01/27/2015  . Abnormal LFTs 01/27/2015  . Benign essential HTN 01/27/2015  . Gastro-esophageal reflux disease without esophagitis 01/27/2015  . Personal history of arthritis 01/27/2015  . Insomnia 01/27/2015  . Scoliosis 01/27/2015  . FOM (frequency of micturition) 01/27/2015    Joneen Boers PT, DPT   01/09/2019, 9:15 PM  [facility] [address] [place], [address] Phone: [phone]   Fax:  [phone]  Name: Kristi Lee MRN: [id number] Date of Birth: September 02, 1955

## 2019-01-14 ENCOUNTER — Ambulatory Visit: Payer: Managed Care, Other (non HMO)

## 2019-01-16 ENCOUNTER — Ambulatory Visit: Payer: Managed Care, Other (non HMO) | Attending: Physician Assistant | Admitting: Physical Therapy

## 2019-01-16 ENCOUNTER — Other Ambulatory Visit: Payer: Self-pay

## 2019-01-16 ENCOUNTER — Encounter: Payer: Self-pay | Admitting: Physical Therapy

## 2019-01-16 DIAGNOSIS — M6281 Muscle weakness (generalized): Secondary | ICD-10-CM | POA: Diagnosis present

## 2019-01-16 DIAGNOSIS — M25612 Stiffness of left shoulder, not elsewhere classified: Secondary | ICD-10-CM | POA: Insufficient documentation

## 2019-01-16 DIAGNOSIS — G8929 Other chronic pain: Secondary | ICD-10-CM | POA: Diagnosis present

## 2019-01-16 DIAGNOSIS — M25512 Pain in left shoulder: Secondary | ICD-10-CM | POA: Diagnosis present

## 2019-01-16 NOTE — Therapy (Signed)
[facility] [address], [place], [address] Phone: [phone]   Fax:  [phone]  Physical Therapy Treatment  Patient Details  Name: Kristi Lee MRN: [id number] Date of Birth: 1956/04/27 Referring Provider (PT): Vance Peper, PA   Encounter Date: 01/16/2019  PT End of Session - 01/16/19 1038    Visit Number  2    Number of Visits  13    Date for PT Re-Evaluation  02/21/19    PT Start Time  2992    PT Stop Time  1100    PT Time Calculation (min)  45 min    Activity Tolerance  Patient tolerated treatment well    Behavior During Therapy  [facility] for tasks assessed/performed       Past Medical History:  Diagnosis Date  . Depression   . GERD (gastroesophageal reflux disease)   . Hypertension     Past Surgical History:  Procedure Laterality Date  . ABDOMINAL HYSTERECTOMY    . BACK SURGERY  10/2000  . BREAST EXCISIONAL BIOPSY Right 1990's   NEG  . FEMUR FRACTURE SURGERY Right 07/15/2009   Pertrochanteric femur fracture.  [name] HIP FRACTURE SURGERY Right   . REVISION TOTAL HIP ARTHROPLASTY Left     There were no vitals filed for this visit.  Subjective Assessment - 01/16/19 1019    Subjective  Patinet reports shecan tell a difference with HEP but has not been able to complete very often d/t husband having emergency gall bladder surgery. 2/10 L shoulder pain this am.     Pertinent History  L shoulder pain. Has been hurting on and off. Had a 3rd shot of cortisone in March 2020 which helped for 3-4 weeks but pain returned this time. Found some exercises online to do for her shoulder. Pain improved but can't lift anything up with her L hand.  Pt is R hand dominant.  Pain began February 2020, gradual onset. Pain is better since onset in February 2020.  Pain with reaching up or donning and doffing clothes. No pain with reaching behind. Currenlty able to lie down on her L side as well.  Was not able to perform  aforementioned activities prior to her cortisone shot.     Limitations  Walking;Sitting;House hold activities    How long can you walk comfortably?  Pain with even minimal ambulation     Diagnostic tests  SIJ injection - no pain, nothing on X-rays identified.     Patient Stated Goals  Be able to lift stuff up with her L UE    Pain Onset  More than a month ago          Ther-Ex - AAROM with theracane ER with towel for TC of positioning x10 3-5 sec holds - AROM ER x10 (HEP review) - Standing ER GTB x10, with patient unable to obtain AROM; RTB 2x 10 with cuing for posture, with good carry over - Seated scapular retractions x10 with cuing for scap activation and neutral cervical posture - Standing rows RTB x10; GTB 2x 10 with cuing for set up for proper posture with good carry over following - Standing scaption 3x 10/9/8 with TC for proper posture without shoulder hike on L, education on shoulder hiking and UT over activation, heavy cuing needed initially with decreased  - UT stretch 2x 30sec hold - Supine pec stretch on 1/2 foam roll 2x 30sec hold- educated patient on completing at home on towel rolled; attempted  multiple standing doorframe positions, unable to complete without pain  Manual Multiple bouts of PROM in all direction with focus on ER, which patient reports is difficult d/t deltoid tension STM with trigger point release to ant and middle deltoid with decreased tension following GHJ GIII post mobilization 30sec bouts 4bouts in neutral; 6 bouts with movement into ER                   PT Education - 01/16/19 1038    Education provided  Yes    Education Details  HEP review, therex form    Person(s) Educated  Patient    Methods  Explanation;Demonstration;Tactile cues;Verbal cues    Comprehension  Verbalized understanding;Returned demonstration;Tactile cues required;Verbal cues required       PT Short Term Goals - 01/09/19 2053      PT SHORT TERM GOAL #1    Title  Patient will be independent with her HEP to decrease shoulder pain, improve ability to lift items with her L UE.     Baseline  Pt has started her HEP (01/09/2019)    Time  3    Period  Weeks    Status  New    Target Date  01/31/19        PT Long Term Goals - 01/09/19 2054      PT LONG TERM GOAL #1   Title  Patient will have a decrease in L shoulder pain to 2/10 or less at worst to promote ability to reach, don and doff clothing, and lift items with her L UE.     Baseline  4/10 L shoulder pain at worst for the past month (01/09/2019)    Time  6    Period  Weeks    Status  New    Target Date  02/21/19      PT LONG TERM GOAL #2   Title  Patient will improve L shoulder flexion AROM to at least 130 degrees, and abduction AROM to at least 120 degrees to promote ability to reach.     Baseline  L shoulder AROM: 117 degrees flexion, 84 degrees abduction (01/09/2019)    Time  6    Period  Weeks    Status  New    Target Date  02/21/19      PT LONG TERM GOAL #3   Title  Patient will improve L shoulder strength by at least 1/2 MMT grade to promote ability to reach, don and doff clothes, and lift items with her L UE more comfortably.     Baseline  L shoulder: 4/5 flexion, 4-/5 abduction, 4+/5 IR, 3+/5 ER (01/09/2019)    Time  6    Period  Weeks    Status  New    Target Date  02/21/19            Plan - 01/16/19 1131    Clinical Impression Statement  PT utilized manual techniques and stretching to improve shoulder ROM with good success, allowing for AROM and low level strengthening progression.  Pt requires hevay cuing for proper motor control and coordination, with shoulder hiking and rounded shoulder position with most therex, until corrected. Is able to comply with cuing for the most part to increase mechanical advantage of shoulder joint. PT will continue progression as able to restore function needed for ADLs    Personal Factors and Comorbidities  Age;Time since onset of  injury/illness/exacerbation;Profession;Fitness    Examination-Activity Limitations  Carry;Lift;Dressing;Reach Overhead;Hygiene/Grooming    Stability/Clinical Decision  Making  Stable/Uncomplicated    Rehab Potential  Good    PT Frequency  2x / week    PT Duration  6 weeks    PT Treatment/Interventions  Aquatic Therapy;Electrical Stimulation;Iontophoresis 4mg /ml Dexamethasone;Ultrasound;Therapeutic activities;Therapeutic exercise;Neuromuscular re-education;Patient/family education;Manual techniques;Passive range of motion;Dry needling;Spinal Manipulations;Joint Manipulations    PT Next Visit Plan  scapular and infraspinatus strengthening, glenohumeral control, manual techniques, modalities PRN      PT Home Exercise Plan  [employer] Access Code: [email or web address]    Consulted and Agree with Plan of Care  Patient       Patient will benefit from skilled therapeutic intervention in order to improve the following deficits and impairments:  Decreased range of motion, Decreased strength, Impaired UE functional use, Improper body mechanics, Postural dysfunction, Pain  Visit Diagnosis: Chronic left shoulder pain     Problem List Patient Active Problem List   Diagnosis Date Noted  . Hyperlipidemia, mixed 10/10/2016  . Stable angina pectoris ([place]) 10/10/2016  . Adaptation reaction 01/27/2015  . Back pain, chronic 01/27/2015  . CN (constipation) 01/27/2015  . Abnormal LFTs 01/27/2015  . Benign essential HTN 01/27/2015  . Gastro-esophageal reflux disease without esophagitis 01/27/2015  . Personal history of arthritis 01/27/2015  . Insomnia 01/27/2015  . Scoliosis 01/27/2015  . FOM (frequency of micturition) 01/27/2015   Shelton Silvas PT, DPT Shelton Silvas 01/16/2019, 11:41 AM  [place] [facility] [address], [place], [address] Phone: [phone]   Fax:  [phone]  Name: Kristi Lee MRN: [id number] Date of Birth:  11-30-1955

## 2019-01-21 ENCOUNTER — Ambulatory Visit: Payer: Managed Care, Other (non HMO)

## 2019-01-21 ENCOUNTER — Other Ambulatory Visit: Payer: Self-pay

## 2019-01-21 DIAGNOSIS — M6281 Muscle weakness (generalized): Secondary | ICD-10-CM

## 2019-01-21 DIAGNOSIS — M25512 Pain in left shoulder: Secondary | ICD-10-CM | POA: Diagnosis not present

## 2019-01-21 DIAGNOSIS — G8929 Other chronic pain: Secondary | ICD-10-CM

## 2019-01-21 DIAGNOSIS — M25612 Stiffness of left shoulder, not elsewhere classified: Secondary | ICD-10-CM

## 2019-01-21 NOTE — Therapy (Signed)
[facility] [address], [place], [address] Phone: [phone]   Fax:  [phone]  Physical Therapy Treatment  Patient Details  Name: Kristi Lee MRN: [id number] Date of Birth: September 11, 1955 Referring Provider (PT): Vance Peper, [place]   Encounter Date: 01/21/2019  PT End of Session - 01/21/19 1001    Visit Number  3    Number of Visits  13    Date for PT Re-Evaluation  02/21/19    PT Start Time  1001    PT Stop Time  1044    PT Time Calculation (min)  43 min    Activity Tolerance  Patient tolerated treatment well    Behavior During Therapy  [facility] for tasks assessed/performed       Past Medical History:  Diagnosis Date  . Depression   . GERD (gastroesophageal reflux disease)   . Hypertension     Past Surgical History:  Procedure Laterality Date  . ABDOMINAL HYSTERECTOMY    . BACK SURGERY  10/2000  . BREAST EXCISIONAL BIOPSY Right 1990's   NEG  . FEMUR FRACTURE SURGERY Right 07/15/2009   Pertrochanteric femur fracture.  [name] HIP FRACTURE SURGERY Right   . REVISION TOTAL HIP ARTHROPLASTY Left     There were no vitals filed for this visit.  Subjective Assessment - 01/21/19 1002    Subjective  L shoulder is better. I can tell a difference sometimes. No pain currently. L shoulder was good after last session.     Pertinent History  L shoulder pain. Has been hurting on and off. Had a 3rd shot of cortisone in March 2020 which helped for 3-4 weeks but pain returned this time. Found some exercises online to do for her shoulder. Pain improved but can't lift anything up with her L hand.  Pt is R hand dominant.  Pain began February 2020, gradual onset. Pain is better since onset in February 2020.  Pain with reaching up or donning and doffing clothes. No pain with reaching behind. Currenlty able to lie down on her L side as well.  Was not able to perform aforementioned activities prior to her cortisone shot.     Limitations  Walking;Sitting;House hold activities    How long can you walk comfortably?  Pain with even minimal ambulation     Diagnostic tests  SIJ injection - no pain, nothing on X-rays identified.     Patient Stated Goals  Be able to lift stuff up with her L UE    Currently in Pain?  No/denies    Pain Score  0-No pain    Pain Onset  More than a month ago                                PT Education - 01/21/19 1007    Education provided  Yes    Education Details  ther-ex    [employer]) Educated  Patient    Methods  Explanation;Demonstration;Tactile cues;Verbal cues    Comprehension  Returned demonstration;Verbalized understanding      Objectives   No latex band allergies Blood pressure controlled per pt.   Has steel rods along spine from low back to upper thoracic spine due to scoliosis since she was 63 years old    Medbridge Access Code: [email or web address]  Ther-Ex  Seated manually resisted L scapular retraction targeting the lower trap muscles 10x3 with 5 seconds holds  Seated manually resisted L scapular depression isometrics in neutral 10x3 with 5 second holds   Standing shoulder ER with red theraband, unable to perform, then yellow, unable to perform, then     Seated manually resisted L shoulder ER 10x. Difficult   Seated L shoulder ER with PT assist to end range, gentle isometric resistance at end range 10x5 second holds for 3 sets. Difficulty secondary to weakness  Standing L shoulder AAROM with PT assist to decrease shoulder shrug 5x2. Mirror visual cues as well as tactile and verbal cues.     Improved exercise technique, movement at target joints, use of target muscles after mod verbal, visual, tactile cues.   Manual therapy   Seated STM L distal pectoralis muscle to decrease tension and promote scapular retraction when raising her L arm   Seated STM L upper trap muscle to decrease tension and promote scapular retraction  Seated STM L  teres major muscle to decrease tension and promote shoulder flexion with less discomfort.    Response to treatment Good muscle use felt with exercises. Pt tolerated session well without aggravation of symptoms.    Clinical impression Pt demonstrates improving ability to perform L shoulder flexion AROM with less discomfort compared to initial evaluation. Still demonstrates L shoulder shrug compensation due to weakness with pt needing cues and assist to correct. Demonstrates L infraspinatus muscle weakness but pt able to provide increased resistance at times during isometric exercise. Worked on manual therapy to decrease L pectoralis, upper trap, and teres major muscle tension to promote shoulder flexion AROM. Pt tolerated session well without aggravation of symptoms. Pt will benefit from continued skilled physical therapy services to decrease pain, improve L shoulder AROM, scapular control, strength, and function.            PT Short Term Goals - 01/09/19 2053      PT SHORT TERM GOAL #1   Title  Patient will be independent with her HEP to decrease shoulder pain, improve ability to lift items with her L UE.     Baseline  Pt has started her HEP (01/09/2019)    Time  3    Period  Weeks    Status  New    Target Date  01/31/19        PT Long Term Goals - 01/09/19 2054      PT LONG TERM GOAL #1   Title  Patient will have a decrease in L shoulder pain to 2/10 or less at worst to promote ability to reach, don and doff clothing, and lift items with her L UE.     Baseline  4/10 L shoulder pain at worst for the past month (01/09/2019)    Time  6    Period  Weeks    Status  New    Target Date  02/21/19      PT LONG TERM GOAL #2   Title  Patient will improve L shoulder flexion AROM to at least 130 degrees, and abduction AROM to at least 120 degrees to promote ability to reach.     Baseline  L shoulder AROM: 117 degrees flexion, 84 degrees abduction (01/09/2019)    Time  6    Period   Weeks    Status  New    Target Date  02/21/19      PT LONG TERM GOAL #3   Title  Patient will improve L shoulder strength by at least 1/2 MMT grade to promote ability to reach, don  and doff clothes, and lift items with her L UE more comfortably.     Baseline  L shoulder: 4/5 flexion, 4-/5 abduction, 4+/5 IR, 3+/5 ER (01/09/2019)    Time  6    Period  Weeks    Status  New    Target Date  02/21/19            Plan - 01/21/19 0957    Clinical Impression Statement  Pt demonstrates improving ability to perform L shoulder flexion AROM with less discomfort compared to initial evaluation. Still demonstrates L shoulder shrug compensation due to weakness with pt needing cues and assist to correct. Demonstrates L infraspinatus muscle weakness but pt able to provide increased resistance at times during isometric exercise. Worked on manual therapy to decrease L pectoralis, upper trap, and teres major muscle tension to promote shoulder flexion AROM. Pt tolerated session well without aggravation of symptoms. Pt will benefit from continued skilled physical therapy services to decrease pain, improve L shoulder AROM, scapular control, strength, and function.     Personal Factors and Comorbidities  Age;Time since onset of injury/illness/exacerbation;Profession;Fitness    Examination-Activity Limitations  Carry;Lift;Dressing;Reach Overhead;Hygiene/Grooming    Stability/Clinical Decision Making  Stable/Uncomplicated    Rehab Potential  Good    PT Frequency  2x / week    PT Duration  6 weeks    PT Treatment/Interventions  Aquatic Therapy;Electrical Stimulation;Iontophoresis 4mg /ml Dexamethasone;Ultrasound;Therapeutic activities;Therapeutic exercise;Neuromuscular re-education;Patient/family education;Manual techniques;Passive range of motion;Dry needling;Spinal Manipulations;Joint Manipulations    PT Next Visit Plan  scapular and infraspinatus strengthening, glenohumeral control, manual techniques, modalities PRN       PT Home Exercise Plan  Medbridge Access Code: [email or web address]    Consulted and Agree with Plan of Care  Patient       Patient will benefit from skilled therapeutic intervention in order to improve the following deficits and impairments:  Decreased range of motion, Decreased strength, Impaired UE functional use, Improper body mechanics, Postural dysfunction, Pain  Visit Diagnosis: Chronic left shoulder pain  Muscle weakness (generalized)  Stiffness of left shoulder joint     Problem List Patient Active Problem List   Diagnosis Date Noted  . Hyperlipidemia, mixed 10/10/2016  . Stable angina pectoris ([place]) 10/10/2016  . Adaptation reaction 01/27/2015  . Back pain, chronic 01/27/2015  . CN (constipation) 01/27/2015  . Abnormal LFTs 01/27/2015  . Benign essential HTN 01/27/2015  . Gastro-esophageal reflux disease without esophagitis 01/27/2015  . Personal history of arthritis 01/27/2015  . Insomnia 01/27/2015  . Scoliosis 01/27/2015  . FOM (frequency of micturition) 01/27/2015    Joneen Boers PT, DPT   01/21/2019, 12:14 PM  [facility] [address] [place], [address] Phone: [phone]   Fax:  [phone]  Name: Kristi Lee MRN: [id number] Date of Birth: [birth year]

## 2019-01-23 ENCOUNTER — Ambulatory Visit: Payer: Managed Care, Other (non HMO)

## 2019-01-28 ENCOUNTER — Ambulatory Visit: Payer: Managed Care, Other (non HMO)

## 2019-02-05 ENCOUNTER — Other Ambulatory Visit: Payer: Self-pay | Admitting: Physician Assistant

## 2019-02-05 DIAGNOSIS — Z1231 Encounter for screening mammogram for malignant neoplasm of breast: Secondary | ICD-10-CM

## 2019-03-11 ENCOUNTER — Other Ambulatory Visit: Payer: Self-pay | Admitting: Physician Assistant

## 2019-03-14 ENCOUNTER — Other Ambulatory Visit: Payer: Self-pay

## 2019-03-14 ENCOUNTER — Ambulatory Visit
Admission: RE | Admit: 2019-03-14 | Discharge: 2019-03-14 | Disposition: A | Discharge status: Home / Self Care | Payer: Managed Care, Other (non HMO) | Source: Ambulatory Visit | Attending: Physician Assistant | Admitting: Physician Assistant

## 2019-03-14 DIAGNOSIS — Z1231 Encounter for screening mammogram for malignant neoplasm of breast: Secondary | ICD-10-CM | POA: Diagnosis present

## 2019-03-15 ENCOUNTER — Other Ambulatory Visit: Payer: Self-pay | Admitting: Physician Assistant

## 2019-03-15 DIAGNOSIS — N6320 Unspecified lump in the left breast, unspecified quadrant: Secondary | ICD-10-CM

## 2019-03-15 DIAGNOSIS — R928 Other abnormal and inconclusive findings on diagnostic imaging of breast: Secondary | ICD-10-CM

## 2019-03-25 ENCOUNTER — Ambulatory Visit
Admission: RE | Admit: 2019-03-25 | Discharge: 2019-03-25 | Disposition: A | Discharge status: Home / Self Care | Payer: Managed Care, Other (non HMO) | Source: Ambulatory Visit | Attending: Physician Assistant | Admitting: Physician Assistant

## 2019-03-25 DIAGNOSIS — R928 Other abnormal and inconclusive findings on diagnostic imaging of breast: Secondary | ICD-10-CM | POA: Insufficient documentation

## 2019-03-25 DIAGNOSIS — N6320 Unspecified lump in the left breast, unspecified quadrant: Secondary | ICD-10-CM | POA: Insufficient documentation

## 2019-04-11 ENCOUNTER — Encounter: Payer: Managed Care, Other (non HMO) | Admitting: Physician Assistant

## 2019-04-15 ENCOUNTER — Ambulatory Visit (INDEPENDENT_AMBULATORY_CARE_PROVIDER_SITE_OTHER): Payer: Managed Care, Other (non HMO) | Admitting: Physician Assistant

## 2019-04-15 ENCOUNTER — Encounter: Payer: Self-pay | Admitting: Physician Assistant

## 2019-04-15 ENCOUNTER — Other Ambulatory Visit: Payer: Self-pay | Admitting: Physician Assistant

## 2019-04-15 ENCOUNTER — Other Ambulatory Visit: Payer: Self-pay

## 2019-04-15 VITALS — BP 111/71 | HR 89 | Temp 97.1°F | Resp 16 | Ht 66.0 in | Wt 155.4 lb

## 2019-04-15 DIAGNOSIS — I10 Essential (primary) hypertension: Secondary | ICD-10-CM

## 2019-04-15 DIAGNOSIS — R350 Frequency of micturition: Secondary | ICD-10-CM

## 2019-04-15 DIAGNOSIS — F4323 Adjustment disorder with mixed anxiety and depressed mood: Secondary | ICD-10-CM

## 2019-04-15 DIAGNOSIS — Z0000 Encounter for general adult medical examination without abnormal findings: Secondary | ICD-10-CM

## 2019-04-15 DIAGNOSIS — E782 Mixed hyperlipidemia: Secondary | ICD-10-CM | POA: Diagnosis not present

## 2019-04-15 DIAGNOSIS — R945 Abnormal results of liver function studies: Secondary | ICD-10-CM | POA: Diagnosis not present

## 2019-04-15 DIAGNOSIS — F5101 Primary insomnia: Secondary | ICD-10-CM

## 2019-04-15 DIAGNOSIS — Z6825 Body mass index (BMI) 25.0-25.9, adult: Secondary | ICD-10-CM

## 2019-04-15 DIAGNOSIS — R7989 Other specified abnormal findings of blood chemistry: Secondary | ICD-10-CM

## 2019-04-15 DIAGNOSIS — I208 Other forms of angina pectoris: Secondary | ICD-10-CM

## 2019-04-15 MED ORDER — PHENTERMINE HCL 30 MG PO CAPS: 30.0000 mg | ORAL_CAPSULE | ORAL | 0 refills | Status: DC

## 2019-04-15 NOTE — Patient Instructions (Signed)

## 2019-04-15 NOTE — Progress Notes (Signed)
Patient: Kristi Lee, Female    DOB: 02/20/1956, 63 y.o.   MRN: [id number] Visit Date: 04/15/2019  Today's Provider: Mar Daring, PA-C   Chief Complaint  Patient presents with  . Annual Exam   Subjective:     Annual physical exam Kristi Lee is a 63 y.o. female who presents today for health maintenance and complete physical. She feels well. She reports exercising. She reports she is sleeping well. ----------------------------------------------------------------- She is going to get her influenza vaccine at [place].  Review of Systems  Constitutional: Negative.   HENT: Negative.   Eyes: Negative.   Respiratory: Negative.   Cardiovascular: Negative.   Gastrointestinal: Negative.   Endocrine: Negative.   Genitourinary: Negative.   Musculoskeletal: Negative.   Skin: Negative.   Allergic/Immunologic: Negative.   Neurological: Negative.   Hematological: Negative.   Psychiatric/Behavioral: Negative.     Social History      She  reports that she has never smoked. She has never used smokeless tobacco. She reports that she does not drink alcohol or use drugs.       Social History   Socioeconomic History  . Marital status: Married    Spouse name: Not on file  . Number of children: 1  . Years of education: HS  . Highest education level: Not on file  Occupational History  . Occupation: Industrial/product designer: [employer]  . Financial resource strain: Not on file  . Food insecurity    Worry: Not on file    Inability: Not on file  . Transportation needs    Medical: Not on file    Non-medical: Not on file  Tobacco Use  . Smoking status: Never Smoker  . Smokeless tobacco: Never Used  Substance and Sexual Activity  . Alcohol use: No  . Drug use: No  . Sexual activity: Not on file  Lifestyle  . Physical activity    Days per week: Not on file    Minutes per session: Not on file  . Stress: Not on file  Relationships   . Social Herbalist on phone: Not on file    Gets together: Not on file    Attends religious service: Not on file    Active member of club or organization: Not on file    Attends meetings of clubs or organizations: Not on file    Relationship status: Not on file  Other Topics Concern  . Not on file  Social History Narrative  . Not on file    Past Medical History:  Diagnosis Date  . Depression   . GERD (gastroesophageal reflux disease)   . Hypertension      Patient Active Problem List   Diagnosis Date Noted  . Hyperlipidemia, mixed 10/10/2016  . Stable angina pectoris ([place]) 10/10/2016  . Adaptation reaction 01/27/2015  . Back pain, chronic 01/27/2015  . CN (constipation) 01/27/2015  . Abnormal LFTs 01/27/2015  . Benign essential HTN 01/27/2015  . Gastro-esophageal reflux disease without esophagitis 01/27/2015  . Personal history of arthritis 01/27/2015  . Insomnia 01/27/2015  . Scoliosis 01/27/2015  . FOM (frequency of micturition) 01/27/2015    Past Surgical History:  Procedure Laterality Date  . ABDOMINAL HYSTERECTOMY    . BACK SURGERY  10/2000  . BREAST EXCISIONAL BIOPSY Right 1990's   NEG  . FEMUR FRACTURE SURGERY Right 07/15/2009   Pertrochanteric femur fracture.  [name] HIP FRACTURE SURGERY Right   .  REVISION TOTAL HIP ARTHROPLASTY Left     Family History        Family Status  Relation Name Status  . Mother  Alive  . Father  Deceased  . Brother  Alive  . Sister  Alive  . Brother  Alive  . Daughter  Alive  . Neg Hx  (Not Specified)        Her family history includes Diabetes in her brother; Lung cancer in her father; Osteoarthritis in her mother. There is no history of Breast cancer.      No Known Allergies   Current Outpatient Medications:  .  aspirin EC 81 MG tablet, Take 81 mg by mouth daily., Disp: , Rfl:  .  atenolol (TENORMIN) 25 MG tablet, TAKE 1 TABLET BY MOUTH  DAILY, Disp: 90 tablet, Rfl: 1 .  celecoxib (CELEBREX) 200 MG  capsule, TAKE 1 CAPSULE BY MOUTH  DAILY, Disp: 90 capsule, Rfl: 3 .  omeprazole (PRILOSEC) 20 MG capsule, TAKE 1 CAPSULE BY MOUTH  DAILY, Disp: 90 capsule, Rfl: 3 .  tolterodine (DETROL) 2 MG tablet, TAKE 1 TABLET BY MOUTH  TWICE DAILY, Disp: 180 tablet, Rfl: 3 .  traZODone (DESYREL) 100 MG tablet, TAKE 1 TABLET BY MOUTH AT  BEDTIME, Disp: 90 tablet, Rfl: 3 .  triamterene-hydrochlorothiazide (MAXZIDE-25) 37.5-25 MG tablet, TAKE 1 TABLET BY MOUTH  DAILY, Disp: 90 tablet, Rfl: 1 .  venlafaxine XR (EFFEXOR-XR) 75 MG 24 hr capsule, TAKE 1 CAPSULE BY MOUTH  DAILY, Disp: 90 capsule, Rfl: 3   Patient Care Team: Mar Daring, PA-C as PCP - General (Family Medicine)    Objective:    Vitals: BP 111/71 (BP Location: Left Arm, Patient Position: Sitting, Cuff Size: Large)   Pulse 89   Temp (!) 97.1 F (36.2 C) (Other (Comment)) Comment (Src): forehead  Resp 16   Ht 5\' 6"  (1.676 m)   Wt 155 lb 6.4 oz (70.5 kg)   BMI 25.08 kg/m    Vitals:   04/15/19 1504  BP: 111/71  Pulse: 89  Resp: 16  Temp: (!) 97.1 F (36.2 C)  TempSrc: Other (Comment)  Weight: 155 lb 6.4 oz (70.5 kg)  Height: 5\' 6"  (1.676 m)     Physical Exam Vitals signs reviewed.  Constitutional:      General: She is not in acute distress.    Appearance: Normal appearance. She is well-developed and normal weight. She is not ill-appearing or diaphoretic.  HENT:     Head: Normocephalic and atraumatic.     Right Ear: Tympanic membrane, ear canal and external ear normal.     Left Ear: Tympanic membrane, ear canal and external ear normal.     Nose: Nose normal.     Mouth/Throat:     Mouth: Mucous membranes are moist.     Pharynx: Oropharynx is clear. No oropharyngeal exudate.  Eyes:     General: No scleral icterus.       Right eye: No discharge.        Left eye: No discharge.     Extraocular Movements: Extraocular movements intact.     Conjunctiva/sclera: Conjunctivae normal.     Pupils: Pupils are equal, round, and  reactive to light.  Neck:     Musculoskeletal: Normal range of motion and neck supple.     Thyroid: No thyromegaly.     Vascular: No carotid bruit or JVD.     Trachea: No tracheal deviation.  Cardiovascular:     Rate and Rhythm:  Normal rate and regular rhythm.     Pulses: Normal pulses.     Heart sounds: Normal heart sounds. No murmur. No friction rub. No gallop.   Pulmonary:     Effort: Pulmonary effort is normal. No respiratory distress.     Breath sounds: Normal breath sounds. No wheezing or rales.  Chest:     Chest wall: No tenderness.  Abdominal:     General: Abdomen is flat. Bowel sounds are normal. There is no distension.     Palpations: Abdomen is soft. There is no mass.     Tenderness: There is no abdominal tenderness. There is no guarding or rebound.  Genitourinary:    Comments: Pelvic and breast exam deferred by patient Musculoskeletal: Normal range of motion.        General: No tenderness.     Right lower leg: No edema.     Left lower leg: No edema.  Lymphadenopathy:     Cervical: No cervical adenopathy.  Skin:    General: Skin is warm and dry.     Capillary Refill: Capillary refill takes less than 2 seconds.     Findings: Rash (psoriasis plaque on the lateral aspect of the right foot flared currently) present.  Neurological:     General: No focal deficit present.     Mental Status: She is alert and oriented to person, place, and time. Mental status is at baseline.     Cranial Nerves: No cranial nerve deficit.     Motor: No weakness.     Coordination: Coordination normal.     Gait: Gait abnormal (antalgic and slightly kyphotic).  Psychiatric:        Mood and Affect: Mood normal.        Behavior: Behavior normal.        Thought Content: Thought content normal.        Judgment: Judgment normal.      Depression Screen PHQ 2/9 Scores 04/15/2019 10/27/2017 10/26/2016 10/06/2015  PHQ - 2 Score 0 0 0 0  PHQ- 9 Score - - 0 -       Assessment & Plan:      Routine Health Maintenance and Physical Exam  Exercise Activities and Dietary recommendations Goals    . Exercise 150 minutes per week (moderate activity)       Immunization History  Administered Date(s) Administered  . Influenza Split 05/30/2013  . Tdap 08/01/2012    Health Maintenance  Topic Date Due  . INFLUENZA VACCINE  03/16/2019  . PAP SMEAR-Modifier  09/11/2019  . MAMMOGRAM  03/13/2021  . COLONOSCOPY  05/01/2021  . TETANUS/TDAP  08/01/2022  . Hepatitis C Screening  Completed  . HIV Screening  Completed     Discussed health benefits of physical activity, and encouraged her to engage in regular exercise appropriate for her age and condition.    1. Annual physical exam Normal physical exam today. Will check labs as below and f/u pending lab results. If labs are stable and WNL she will not need to have these rechecked for one year at her next annual physical exam. She is to call the office in the meantime if she has any acute issue, questions or concerns.  2. Benign essential HTN Stable. Continue atenolol 25mg  and maxzide 37.5-25mg  daily. Will check labs as below and f/u pending results. - CBC with Differential/Platelet - Comprehensive metabolic panel - Hemoglobin A1c - Lipid panel - TSH  3. Hyperlipidemia, mixed Stable. Diet controlled. Will check labs as below and  f/u pending results. - CBC with Differential/Platelet - Comprehensive metabolic panel - Hemoglobin A1c - Lipid panel  4. Abnormal LFTs Will check labs as below and f/u pending results. - CBC with Differential/Platelet - Comprehensive metabolic panel - Hemoglobin A1c - Lipid panel  5. Stable angina pectoris ([facility]) No symptoms recently. Will check labs as below and f/u pending results. - CBC with Differential/Platelet - Comprehensive metabolic panel - Lipid panel - TSH  6. BMI 25.0-25.9,adult Stable. Diagnosis pulled for medication refill. Continue current medical treatment plan. - phentermine  30 MG capsule; Take 1 capsule (30 mg total) by mouth every morning.  Dispense: 90 capsule; Refill: 0  --------------------------------------------------------------------    Mar Daring, PA-C  [facility]

## 2019-04-27 LAB — COMPREHENSIVE METABOLIC PANEL
ALT: 22 IU/L (ref 0–32)
AST: 25 IU/L (ref 0–40)
Albumin/Globulin Ratio: 1.8 (ref 1.2–2.2)
Albumin: 4.5 g/dL (ref 3.8–4.8)
Alkaline Phosphatase: 142 IU/L — ABNORMAL HIGH (ref 39–117)
BUN/Creatinine Ratio: 27 (ref 12–28)
BUN: 25 mg/dL (ref 8–27)
Bilirubin Total: 0.3 mg/dL (ref 0.0–1.2)
CO2: 25 mmol/L (ref 20–29)
Calcium: 9.7 mg/dL (ref 8.7–10.3)
Chloride: 104 mmol/L (ref 96–106)
Creatinine, Ser: 0.91 mg/dL (ref 0.57–1.00)
GFR calc Af Amer: 77 mL/min/{1.73_m2} (ref >59)
GFR calc non Af Amer: 67 mL/min/{1.73_m2} (ref >59)
Globulin, Total: 2.5 g/dL (ref 1.5–4.5)
Glucose: 94 mg/dL (ref 65–99)
Potassium: 3.5 mmol/L (ref 3.5–5.2)
Sodium: 145 mmol/L — ABNORMAL HIGH (ref 134–144)
Total Protein: 7 g/dL (ref 6.0–8.5)

## 2019-04-27 LAB — HEMOGLOBIN A1C
Est. average glucose Bld gHb Est-mCnc: 117 mg/dL
Hgb A1c MFr Bld: 5.7 % — ABNORMAL HIGH (ref 4.8–5.6)

## 2019-04-27 LAB — CBC WITH DIFFERENTIAL/PLATELET
Basophils Absolute: 0.1 10*3/uL (ref 0.0–0.2)
Basos: 1 %
EOS (ABSOLUTE): 0.2 10*3/uL (ref 0.0–0.4)
Eos: 3 %
Hematocrit: 38.3 % (ref 34.0–46.6)
Hemoglobin: 12.7 g/dL (ref 11.1–15.9)
Immature Grans (Abs): 0.1 10*3/uL (ref 0.0–0.1)
Immature Granulocytes: 1 %
Lymphocytes Absolute: 2 10*3/uL (ref 0.7–3.1)
Lymphs: 22 %
MCH: 28 pg (ref 26.6–33.0)
MCHC: 33.2 g/dL (ref 31.5–35.7)
MCV: 84 fL (ref 79–97)
Monocytes Absolute: 0.9 10*3/uL (ref 0.1–0.9)
Monocytes: 10 %
Neutrophils Absolute: 6 10*3/uL (ref 1.4–7.0)
Neutrophils: 63 %
Platelets: 216 10*3/uL (ref 150–450)
RBC: 4.54 x10E6/uL (ref 3.77–5.28)
RDW: 13.1 % (ref 11.7–15.4)
WBC: 9.3 10*3/uL (ref 3.4–10.8)

## 2019-04-27 LAB — LIPID PANEL
Chol/HDL Ratio: 2.8 ratio (ref 0.0–4.4)
Cholesterol, Total: 189 mg/dL (ref 100–199)
HDL: 67 mg/dL (ref >39)
LDL Chol Calc (NIH): 102 mg/dL — ABNORMAL HIGH (ref 0–99)
Triglycerides: 111 mg/dL (ref 0–149)
VLDL Cholesterol Cal: 20 mg/dL (ref 5–40)

## 2019-04-27 LAB — TSH: TSH: 1.61 u[IU]/mL (ref 0.450–4.500)

## 2019-06-03 ENCOUNTER — Other Ambulatory Visit: Payer: Self-pay | Admitting: Physician Assistant

## 2019-06-03 DIAGNOSIS — I10 Essential (primary) hypertension: Secondary | ICD-10-CM

## 2019-06-06 ENCOUNTER — Ambulatory Visit: Payer: Managed Care, Other (non HMO) | Admitting: Physician Assistant

## 2019-06-06 ENCOUNTER — Ambulatory Visit
Admission: RE | Admit: 2019-06-06 | Discharge: 2019-06-06 | Disposition: A | Discharge status: Home / Self Care | Payer: Managed Care, Other (non HMO) | Source: Ambulatory Visit | Attending: Physician Assistant | Admitting: Physician Assistant

## 2019-06-06 ENCOUNTER — Other Ambulatory Visit: Payer: Self-pay

## 2019-06-06 ENCOUNTER — Ambulatory Visit
Admission: RE | Admit: 2019-06-06 | Discharge: 2019-06-06 | Disposition: A | Discharge status: Home / Self Care | Payer: Managed Care, Other (non HMO) | Attending: Physician Assistant | Admitting: Physician Assistant

## 2019-06-06 ENCOUNTER — Encounter: Payer: Self-pay | Admitting: Physician Assistant

## 2019-06-06 VITALS — BP 136/82 | HR 56 | Temp 96.8°F | Resp 16 | Wt 170.0 lb

## 2019-06-06 DIAGNOSIS — M542 Cervicalgia: Secondary | ICD-10-CM | POA: Insufficient documentation

## 2019-06-06 MED ORDER — CYCLOBENZAPRINE HCL 5 MG PO TABS: 5.0000 mg | ORAL_TABLET | Freq: Every day | ORAL | 1 refills | Status: DC

## 2019-06-06 NOTE — Progress Notes (Signed)
Patient: Kristi Lee Female    DOB: Aug 28, 1955   63 y.o.   MRN: [id number] Visit Date: 06/06/2019  Today's Provider: Trinna Post, PA-C   Chief Complaint  Patient presents with  . Neck Pain    x 4 weeks   Subjective:     Neck Pain  This is a new problem. Episode onset: 1 month ago. The problem occurs constantly. The problem has been unchanged. The pain is present in the left side, right side and occipital region. The quality of the pain is described as aching ( sometines a sharp pain). Nothing aggravates the symptoms. Associated symptoms include headaches (posterior right side ). Pertinent negatives include no chest pain, fever, numbness, tingling, visual change or weakness. She has tried NSAIDs for the symptoms. The treatment provided mild relief.   Patient with history of spinal surgery for scoliosis. Denies sharp and shooting pain into her arms, arm weakness or loss of grip strength.   No Known Allergies   Current Outpatient Medications:  .  aspirin EC 81 MG tablet, Take 81 mg by mouth daily., Disp: , Rfl:  .  atenolol (TENORMIN) 25 MG tablet, TAKE 1 TABLET BY MOUTH  DAILY, Disp: 90 tablet, Rfl: 3 .  celecoxib (CELEBREX) 200 MG capsule, TAKE 1 CAPSULE BY MOUTH  DAILY, Disp: 90 capsule, Rfl: 3 .  omeprazole (PRILOSEC) 20 MG capsule, TAKE 1 CAPSULE BY MOUTH  DAILY, Disp: 90 capsule, Rfl: 3 .  tolterodine (DETROL) 2 MG tablet, TAKE 1 TABLET BY MOUTH  TWICE DAILY, Disp: 180 tablet, Rfl: 3 .  traZODone (DESYREL) 100 MG tablet, TAKE 1 TABLET BY MOUTH AT  BEDTIME, Disp: 90 tablet, Rfl: 3 .  triamterene-hydrochlorothiazide (MAXZIDE-25) 37.5-25 MG tablet, TAKE 1 TABLET BY MOUTH  DAILY, Disp: 90 tablet, Rfl: 1 .  venlafaxine XR (EFFEXOR-XR) 75 MG 24 hr capsule, TAKE 1 CAPSULE BY MOUTH  DAILY, Disp: 90 capsule, Rfl: 3 .  phentermine 30 MG capsule, Take 1 capsule (30 mg total) by mouth every morning. (Patient not taking: Reported on 06/06/2019), Disp: 90 capsule, Rfl: 0   Review of Systems  Constitutional: Negative for appetite change, chills, fatigue and fever.  HENT: Positive for ear pain (right ear pain).   Respiratory: Negative for chest tightness and shortness of breath.   Cardiovascular: Negative for chest pain and palpitations.  Gastrointestinal: Negative for abdominal pain, nausea and vomiting.  Musculoskeletal: Positive for neck pain.  Neurological: Positive for headaches (posterior right side ). Negative for dizziness, tingling, weakness and numbness.    Social History   Tobacco Use  . Smoking status: Never Smoker  . Smokeless tobacco: Never Used  Substance Use Topics  . Alcohol use: No      Objective:   BP 136/82 (BP Location: Right Arm, Patient Position: Sitting, Cuff Size: Large)   Pulse (!) 56   Temp (!) 96.8 F (36 C) (Temporal)   Resp 16   Wt 170 lb (77.1 kg)   SpO2 97% Comment: room air  BMI 27.44 kg/m  Vitals:   06/06/19 0947 06/06/19 0953  BP: (!) 144/82 136/82  Pulse: (!) 56   Resp: 16   Temp: (!) 96.8 F (36 C)   TempSrc: Temporal   SpO2: 97%   Weight: 170 lb (77.1 kg)   Body mass index is 27.44 kg/m.   Physical Exam Constitutional:      Appearance: Normal appearance.  Musculoskeletal:     Cervical back: She exhibits pain. She  exhibits normal range of motion, no tenderness, no bony tenderness, no swelling, no edema, no deformity, no laceration, no spasm and normal pulse.  Skin:    General: Skin is warm and dry.  Neurological:     Mental Status: She is alert and oriented to person, place, and time. Mental status is at baseline.  Psychiatric:        Mood and Affect: Mood normal.        Behavior: Behavior normal.      No results found for any visits on 06/06/19.     Assessment & Plan    1. Neck pain  Suspect musclar dysfunction along with arthritis. Talked about treatments for arthritis. She is currently taking celebrex and she may supplement this with up to 3000 mg of tylenol daily. She can also try  a muscle relaxer at night. If not improving, can consider seeing orthopedist for possible injections.   - DG Cervical Spine Complete; Future - cyclobenzaprine (FLEXERIL) 5 MG tablet; Take 1 tablet (5 mg total) by mouth at bedtime.  Dispense: 30 tablet; Refill: 1  The entirety of the information documented in the History of Present Illness, Review of Systems and Physical Exam were personally obtained by me. Portions of this information were initially documented by Meyer Cory, CMA and reviewed by me for thoroughness and accuracy.       Trinna Post, PA-C  [facility]

## 2019-06-17 ENCOUNTER — Telehealth: Payer: Self-pay | Admitting: Physician Assistant

## 2019-06-17 DIAGNOSIS — M5030 Other cervical disc degeneration, unspecified cervical region: Secondary | ICD-10-CM

## 2019-06-17 NOTE — Telephone Encounter (Signed)
Pt returned missed call.  She would like a call back to discuss results.  She doesn't use MyChart.  Please call pt back at [phone].  Thanks, [employer]

## 2019-06-17 NOTE — Telephone Encounter (Signed)
Have Placed referral for ortho for neck pain.

## 2019-06-17 NOTE — Telephone Encounter (Signed)
Yes, tell the patient the results please I have already resulted this and it does not need to be sent to me. They are also visible in MyChart.

## 2019-06-17 NOTE — Addendum Note (Signed)
Addended by: Trinna Post on: 06/17/2019 04:31 PM   Modules accepted: Orders

## 2019-06-17 NOTE — Telephone Encounter (Signed)
Patient was advised. Patient states that she is still having pain and would like the referral place to an orthopedist.

## 2019-06-17 NOTE — Telephone Encounter (Signed)
It looks like she saw [place] on 06-06-2019 and had cervical spine xrays, there is already result note by [place] for this.

## 2019-06-17 NOTE — Telephone Encounter (Signed)
Pt needing a call back for her results from recent Xrays done.  Please call pt back at  [phone].  Thanks, [employer]

## 2019-06-18 NOTE — Telephone Encounter (Signed)
Patient wants to see Dr Haig Prophet with Ortho at [facility]

## 2019-08-19 ENCOUNTER — Other Ambulatory Visit: Payer: Self-pay | Admitting: Physician Assistant

## 2019-08-19 DIAGNOSIS — Z6825 Body mass index (BMI) 25.0-25.9, adult: Secondary | ICD-10-CM

## 2019-08-19 NOTE — Telephone Encounter (Signed)
Requested medication (s) are due for refill today: yes  Requested medication (s) are on the active medication list: yes  Last refill:  04/19/2019  Future visit scheduled: no  Notes to clinic:  This refill cannot be delegated   Requested Prescriptions  Pending Prescriptions Disp Refills   phentermine 30 MG capsule [Pharmacy Med Name: PHENTERMINE  30MG   CAP] 90 capsule     Sig: TAKE 1 CAPSULE BY MOUTH IN  THE MORNING      Not Delegated - Gastroenterology:  Antiobesity Agents Failed - 08/19/2019  7:51 AM      Failed - This refill cannot be delegated      Passed - Last BP in normal range    BP Readings from Last 1 Encounters:  06/06/19 136/82          Passed - Last Heart Rate in normal range    Pulse Readings from Last 1 Encounters:  06/06/19 (!) 56          Passed - Valid encounter within last 12 months    Recent Outpatient Visits           2 months ago Neck pain   [place], Wendee Beavers, [place]   4 months ago Annual physical exam   [facility] [place], Clearnce Sorrel, [place]   1 year ago Annual physical exam   [facility], Clearnce Sorrel, [place]   2 years ago Annual physical exam   [facility] [place], Clearnce Sorrel, [place]   3 years ago Annual physical exam   [facility] Margarita Rana, MD

## 2019-09-20 ENCOUNTER — Other Ambulatory Visit: Payer: Self-pay | Admitting: Physician Assistant

## 2019-09-20 DIAGNOSIS — K219 Gastro-esophageal reflux disease without esophagitis: Secondary | ICD-10-CM

## 2019-09-20 DIAGNOSIS — I10 Essential (primary) hypertension: Secondary | ICD-10-CM

## 2019-09-20 NOTE — Telephone Encounter (Signed)
Requested Prescriptions  Pending Prescriptions Disp Refills  . omeprazole (PRILOSEC) 20 MG capsule [Pharmacy Med Name: OMEPRAZOLE  20MG   CAP] 90 capsule 3    Sig: TAKE 1 CAPSULE BY MOUTH  DAILY     Gastroenterology: Proton Pump Inhibitors Passed - 09/20/2019 10:02 AM      Passed - Valid encounter within last 12 months    Recent Outpatient Visits          3 months ago Neck pain   [place], [place], PA-C   5 months ago Annual physical exam   [place], Clearnce Sorrel, [place]   1 year ago Annual physical exam   [facility] Fenton Malling M, [place]   2 years ago Annual physical exam   [facility] Fenton Malling M, [place]   3 years ago Annual physical exam   [facility] Margarita Rana, MD             . triamterene-hydrochlorothiazide (MAXZIDE-25) 37.5-25 MG tablet [Pharmacy Med Name: TRIAMT-HCTZ 37.5-25MG  TABLET] 90 tablet 1    Sig: TAKE 1 TABLET BY MOUTH  DAILY     Cardiovascular: Diuretic Combos Failed - 09/20/2019 10:02 AM      Failed - Na in normal range and within 360 days    Sodium  Date Value Ref Range Status  04/26/2019 145 (H) 134 - 144 mmol/L Final  11/09/2011 142 136 - 145 mmol/L Final         Passed - K in normal range and within 360 days    Potassium  Date Value Ref Range Status  04/26/2019 3.5 3.5 - 5.2 mmol/L Final  11/09/2011 3.5 3.5 - 5.1 mmol/L Final         Passed - Cr in normal range and within 360 days    Creatinine  Date Value Ref Range Status  11/09/2011 0.68 0.60 - 1.30 mg/dL Final   Creatinine, Ser  Date Value Ref Range Status  04/26/2019 0.91 0.57 - 1.00 mg/dL Final         Passed - Ca in normal range and within 360 days    Calcium  Date Value Ref Range Status  04/26/2019 9.7 8.7 - 10.3 mg/dL Final   Calcium, Total  Date Value Ref Range Status  11/09/2011 7.8 (L) 8.5 - 10.1 mg/dL Final         Passed - Last BP in normal range    BP Readings  from Last 1 Encounters:  06/06/19 136/82         Passed - Valid encounter within last 6 months    Recent Outpatient Visits          3 months ago Neck pain   [place], Wendee Beavers, [place]   5 months ago Annual physical exam   [facility] [place], Clearnce Sorrel, [place]   1 year ago Annual physical exam   [facility] [place], Clearnce Sorrel, [place]   2 years ago Annual physical exam   [facility] Mar Daring, [place]   3 years ago Annual physical exam   [facility] Margarita Rana, MD

## 2019-10-17 DIAGNOSIS — L299 Pruritus, unspecified: Secondary | ICD-10-CM | POA: Insufficient documentation

## 2019-10-17 DIAGNOSIS — M5481 Occipital neuralgia: Secondary | ICD-10-CM | POA: Insufficient documentation

## 2019-10-17 DIAGNOSIS — R208 Other disturbances of skin sensation: Secondary | ICD-10-CM | POA: Insufficient documentation

## 2019-10-17 DIAGNOSIS — R202 Paresthesia of skin: Secondary | ICD-10-CM | POA: Insufficient documentation

## 2019-10-30 ENCOUNTER — Ambulatory Visit
Admission: RE | Admit: 2019-10-30 | Discharge: 2019-10-30 | Disposition: A | Discharge status: Home / Self Care | Payer: Managed Care, Other (non HMO) | Source: Ambulatory Visit | Attending: Physician Assistant | Admitting: Physician Assistant

## 2019-10-30 ENCOUNTER — Ambulatory Visit: Payer: Managed Care, Other (non HMO) | Admitting: Physician Assistant

## 2019-10-30 ENCOUNTER — Encounter: Payer: Self-pay | Admitting: Physician Assistant

## 2019-10-30 ENCOUNTER — Other Ambulatory Visit: Payer: Self-pay

## 2019-10-30 VITALS — BP 130/70 | HR 88 | Temp 96.6°F | Wt 189.0 lb

## 2019-10-30 DIAGNOSIS — M7989 Other specified soft tissue disorders: Secondary | ICD-10-CM | POA: Insufficient documentation

## 2019-10-30 DIAGNOSIS — I824Y2 Acute embolism and thrombosis of unspecified deep veins of left proximal lower extremity: Secondary | ICD-10-CM | POA: Diagnosis not present

## 2019-10-30 MED ORDER — RIVAROXABAN (XARELTO) VTE STARTER PACK (15 & 20 MG): ORAL_TABLET | ORAL | 0 refills | Status: DC

## 2019-10-30 NOTE — Progress Notes (Addendum)
Patient: Kristi Lee Female    DOB: 03-16-1956   64 y.o.   MRN: [id number] Visit Date: 10/30/2019  Today's Provider: Trinna Post, PA-C   Chief Complaint  Patient presents with  . Leg Swelling   Subjective:     HPI  Swelling Patient presents today for left feet and leg swelling for about 4 days now. No recent trips. No problems with her heart. Swelling started suddenly on Saturday or Sunday. There is some tingling but no redness and no pain to palpation. No long car rides or plane rides. No SOB or chest pain. Elevation as not improved the issue and swelling is constant.  No Known Allergies   Current Outpatient Medications:  .  aspirin EC 81 MG tablet, Take 81 mg by mouth daily., Disp: , Rfl:  .  atenolol (TENORMIN) 25 MG tablet, TAKE 1 TABLET BY MOUTH  DAILY, Disp: 90 tablet, Rfl: 3 .  celecoxib (CELEBREX) 200 MG capsule, TAKE 1 CAPSULE BY MOUTH  DAILY, Disp: 90 capsule, Rfl: 3 .  cyclobenzaprine (FLEXERIL) 5 MG tablet, Take 1 tablet (5 mg total) by mouth at bedtime., Disp: 30 tablet, Rfl: 1 .  omeprazole (PRILOSEC) 20 MG capsule, TAKE 1 CAPSULE BY MOUTH  DAILY, Disp: 90 capsule, Rfl: 3 .  tolterodine (DETROL) 2 MG tablet, TAKE 1 TABLET BY MOUTH  TWICE DAILY, Disp: 180 tablet, Rfl: 3 .  traZODone (DESYREL) 100 MG tablet, TAKE 1 TABLET BY MOUTH AT  BEDTIME, Disp: 90 tablet, Rfl: 3 .  triamterene-hydrochlorothiazide (MAXZIDE-25) 37.5-25 MG tablet, TAKE 1 TABLET BY MOUTH  DAILY, Disp: 90 tablet, Rfl: 1 .  venlafaxine XR (EFFEXOR-XR) 75 MG 24 hr capsule, TAKE 1 CAPSULE BY MOUTH  DAILY, Disp: 90 capsule, Rfl: 3 .  phentermine 30 MG capsule, Take 1 capsule (30 mg total) by mouth every morning. (Patient not taking: Reported on 06/06/2019), Disp: 90 capsule, Rfl: 0  Review of Systems  Respiratory: Negative for chest tightness.   Cardiovascular: Positive for leg swelling. Negative for chest pain.  Neurological: Negative for dizziness, light-headedness and headaches.     Social History   Tobacco Use  . Smoking status: Never Smoker  . Smokeless tobacco: Never Used  Substance Use Topics  . Alcohol use: No      Objective:   BP 130/70 (BP Location: Left Arm, Patient Position: Sitting, Cuff Size: Normal)   Pulse 88   Temp (!) 96.6 F (35.9 C) (Temporal)   Wt 189 lb (85.7 kg)   SpO2 94%   BMI 30.51 kg/m  Vitals:   10/30/19 1033  BP: 130/70  Pulse: 88  Temp: (!) 96.6 F (35.9 C)  TempSrc: Temporal  SpO2: 94%  Weight: 189 lb (85.7 kg)  Body mass index is 30.51 kg/m.   Physical Exam Constitutional:      Appearance: Normal appearance.  Cardiovascular:     Rate and Rhythm: Normal rate and regular rhythm.     Heart sounds: Normal heart sounds.  Pulmonary:     Effort: Pulmonary effort is normal.     Breath sounds: Normal breath sounds.  Musculoskeletal:     Right lower leg: No swelling. No edema.     Left lower leg: Swelling present. 1+ Pitting Edema present.     Comments: There is mild pitting edema in left lower extremity but not the right. Her calves when measured just inferior to tibial plateau are equal in circumference. There is about 1 inch larger circumference in  her left ankle than her right. There is no redness or pain to deep palpation. There are many varicose veins.   Skin:    General: Skin is warm and dry.  Neurological:     Mental Status: She is alert.      No results found for any visits on 10/30/19.     Assessment & Plan    1. Left leg swelling  R/o DVT. I suspect this is more due to venous stasis disease however due to sudden nature and discrepancy between lower extremities, will order [place]. I have independently reviewed her prior CXRs which were negative for cardiomegaly. She does not have a history of CHF.   - US Venous Img Lower Unilateral Left; Future  2. Left Lower Extremity DVT  Called patient personally with results about femoral DVT. We will start her on Xarelto 15 mg BID x 21 days before transitioning  to 20 mg BID. We will refer urgently to hem/onc for unprovoked DVT. Return precautions advised.   The entirety of the information documented in the History of Present Illness, Review of Systems and Physical Exam were personally obtained by me. Portions of this information were initially documented by [facility] and reviewed by me for thoroughness and accuracy.      Trinna Post, PA-C  [facility]

## 2019-10-30 NOTE — Addendum Note (Signed)
Addended by: Trinna Post on: 10/30/2019 03:08 PM   Modules accepted: Orders

## 2019-10-31 ENCOUNTER — Other Ambulatory Visit: Payer: Self-pay | Admitting: Physician Assistant

## 2019-10-31 DIAGNOSIS — I10 Essential (primary) hypertension: Secondary | ICD-10-CM

## 2019-10-31 DIAGNOSIS — F4323 Adjustment disorder with mixed anxiety and depressed mood: Secondary | ICD-10-CM

## 2019-10-31 MED ORDER — ATENOLOL 25 MG PO TABS: 25.0000 mg | ORAL_TABLET | Freq: Every day | ORAL | 3 refills | Status: DC

## 2019-10-31 MED ORDER — VENLAFAXINE HCL ER 75 MG PO CP24: 75.0000 mg | ORAL_CAPSULE | Freq: Every day | ORAL | 3 refills | Status: DC

## 2019-10-31 NOTE — Telephone Encounter (Signed)
[facility] faxed refill request for the following medications:  atenolol (TENORMIN) 25 MG tablet venlafaxine XR (EFFEXOR-XR) 75 MG 24 hr capsule  LOV: 10/30/2019 Please advise. Thanks TNP

## 2019-10-31 NOTE — Telephone Encounter (Signed)
Can we change refills for this medication to 90 days with one refill? She is due for follow up in summer. Thanks.

## 2019-11-01 ENCOUNTER — Telehealth: Payer: Self-pay | Admitting: Physician Assistant

## 2019-11-01 NOTE — Telephone Encounter (Signed)
She can walk on it. Elevate when at rest. Make sure to take xarelto as prescribed

## 2019-11-01 NOTE — Telephone Encounter (Signed)
Patient advised as directed below. 

## 2019-11-01 NOTE — Telephone Encounter (Signed)
Pt was advised that she has a blood clot in her left leg and she wants to know what she needs to do as far as walking on it or raising her leg, etc... Pt needs clarification on her care while having the clot in her leg/  Please  advise

## 2019-11-05 ENCOUNTER — Other Ambulatory Visit: Payer: Self-pay

## 2019-11-05 ENCOUNTER — Inpatient Hospital Stay: Payer: Managed Care, Other (non HMO)

## 2019-11-05 ENCOUNTER — Encounter: Payer: Self-pay | Admitting: Oncology

## 2019-11-05 ENCOUNTER — Inpatient Hospital Stay: Payer: Managed Care, Other (non HMO) | Attending: Oncology | Admitting: Oncology

## 2019-11-05 VITALS — BP 193/102 | HR 85 | Temp 97.2°F | Wt 188.8 lb

## 2019-11-05 DIAGNOSIS — I10 Essential (primary) hypertension: Secondary | ICD-10-CM | POA: Diagnosis not present

## 2019-11-05 DIAGNOSIS — I82412 Acute embolism and thrombosis of left femoral vein: Secondary | ICD-10-CM | POA: Diagnosis present

## 2019-11-05 DIAGNOSIS — M2548 Effusion, other site: Secondary | ICD-10-CM | POA: Diagnosis not present

## 2019-11-05 DIAGNOSIS — Z7901 Long term (current) use of anticoagulants: Secondary | ICD-10-CM

## 2019-11-05 DIAGNOSIS — K219 Gastro-esophageal reflux disease without esophagitis: Secondary | ICD-10-CM

## 2019-11-05 DIAGNOSIS — Z832 Family history of diseases of the blood and blood-forming organs and certain disorders involving the immune mechanism: Secondary | ICD-10-CM | POA: Insufficient documentation

## 2019-11-05 DIAGNOSIS — M79605 Pain in left leg: Secondary | ICD-10-CM | POA: Diagnosis not present

## 2019-11-05 DIAGNOSIS — Z801 Family history of malignant neoplasm of trachea, bronchus and lung: Secondary | ICD-10-CM | POA: Insufficient documentation

## 2019-11-05 LAB — CBC WITH DIFFERENTIAL/PLATELET
Abs Immature Granulocytes: 0.15 10*3/uL — ABNORMAL HIGH (ref 0.00–0.07)
Basophils Absolute: 0.2 10*3/uL — ABNORMAL HIGH (ref 0.0–0.1)
Basophils Relative: 2 %
Eosinophils Absolute: 0.3 10*3/uL (ref 0.0–0.5)
Eosinophils Relative: 2 %
HCT: 39.9 % (ref 36.0–46.0)
Hemoglobin: 12.5 g/dL (ref 12.0–15.0)
Immature Granulocytes: 1 %
Lymphocytes Relative: 19 %
Lymphs Abs: 2 10*3/uL (ref 0.7–4.0)
MCH: 26.5 pg (ref 26.0–34.0)
MCHC: 31.3 g/dL (ref 30.0–36.0)
MCV: 84.5 fL (ref 80.0–100.0)
Monocytes Absolute: 1.1 10*3/uL — ABNORMAL HIGH (ref 0.1–1.0)
Monocytes Relative: 11 %
Neutro Abs: 6.7 10*3/uL (ref 1.7–7.7)
Neutrophils Relative %: 65 %
Platelets: 250 10*3/uL (ref 150–400)
RBC: 4.72 MIL/uL (ref 3.87–5.11)
RDW: 15.9 % — ABNORMAL HIGH (ref 11.5–15.5)
WBC: 10.4 10*3/uL (ref 4.0–10.5)
nRBC: 0 % (ref 0.0–0.2)

## 2019-11-05 LAB — COMPREHENSIVE METABOLIC PANEL
ALT: 39 U/L (ref 0–44)
AST: 39 U/L (ref 15–41)
Albumin: 4.5 g/dL (ref 3.5–5.0)
Alkaline Phosphatase: 175 U/L — ABNORMAL HIGH (ref 38–126)
Anion gap: 10 (ref 5–15)
BUN: 25 mg/dL — ABNORMAL HIGH (ref 8–23)
CO2: 27 mmol/L (ref 22–32)
Calcium: 9.1 mg/dL (ref 8.9–10.3)
Chloride: 102 mmol/L (ref 98–111)
Creatinine, Ser: 1.07 mg/dL — ABNORMAL HIGH (ref 0.44–1.00)
GFR calc Af Amer: >60 mL/min (ref >60)
GFR calc non Af Amer: 55 mL/min — ABNORMAL LOW (ref >60)
Glucose, Bld: 107 mg/dL — ABNORMAL HIGH (ref 70–99)
Potassium: 3.8 mmol/L (ref 3.5–5.1)
Sodium: 139 mmol/L (ref 135–145)
Total Bilirubin: 0.6 mg/dL (ref 0.3–1.2)
Total Protein: 7.8 g/dL (ref 6.5–8.1)

## 2019-11-05 NOTE — Progress Notes (Signed)
Patient is here today to establish care for CVT on her left lower extremity.

## 2019-11-06 ENCOUNTER — Telehealth: Payer: Self-pay | Admitting: *Deleted

## 2019-11-06 LAB — CARDIOLIPIN ANTIBODIES, IGG, IGM, IGA
Anticardiolipin IgA: <9 APL U/mL (ref 0–11)
Anticardiolipin IgG: <9 GPL U/mL (ref 0–14)
Anticardiolipin IgM: <9 MPL U/mL (ref 0–12)

## 2019-11-06 LAB — HEX PHASE PHOSPHOLIPID REFLEX

## 2019-11-06 LAB — HEXAGONAL PHASE PHOSPHOLIPID: Hex Phosph Neut Test: 0 s (ref 0–11)

## 2019-11-06 NOTE — Telephone Encounter (Signed)
Hey I know about it but I was waiting on rao note to send it. She did say she was complicated. Note not done yet. I called pt and told her that note should be done tom. And I will send it then and let her know. Pt ok with this.

## 2019-11-06 NOTE — Telephone Encounter (Signed)
[name], do you know anything about this referral? I could help if needed me to.

## 2019-11-06 NOTE — Telephone Encounter (Signed)
Patient called reporting that she was to be referred to [facility] and when she called them today, she was told that they do not have a referral from [place]. She requests a return call as to why it has not been done ans would like for it to be sent.

## 2019-11-07 ENCOUNTER — Other Ambulatory Visit: Payer: Self-pay | Admitting: *Deleted

## 2019-11-07 ENCOUNTER — Telehealth: Payer: Self-pay | Admitting: *Deleted

## 2019-11-07 DIAGNOSIS — I82412 Acute embolism and thrombosis of left femoral vein: Secondary | ICD-10-CM

## 2019-11-07 LAB — LUPUS ANTICOAGULANT
DRVVT: 74.1 s — ABNORMAL HIGH (ref 0.0–47.0)
PTT Lupus Anticoagulant: 29 s (ref 0.0–51.9)
Thrombin Time: 20.6 s (ref 0.0–23.0)
dPT Confirm Ratio: 0.8 Ratio (ref 0.00–1.40)
dPT: 45.6 s (ref 0.0–55.0)

## 2019-11-07 LAB — DRVVT CONFIRM: dRVVT Confirm: 1.1 ratio (ref 0.8–1.2)

## 2019-11-07 LAB — DRVVT MIX: dRVVT Mix: 69.3 s — ABNORMAL HIGH (ref 0.0–40.4)

## 2019-11-07 NOTE — Telephone Encounter (Signed)
I will do it today but you can still send the referral. Vascular surgery to ask if thrombectomy was indicated. My note is not even needed. Her usg says it all

## 2019-11-07 NOTE — Telephone Encounter (Signed)
I put referral in and I called office and I was sent to referral intake person and left message that you wanted her see as soon as possible for thrombectomy if warranted by the results of u/s with  positive clots.

## 2019-11-07 NOTE — Telephone Encounter (Signed)
Called vascular and they called back with appt for pt 3/26 at 8:30. I called pt and she knows where it is and will be there tom. Am. She also has an appt at [facility] at 11:15 and wanted to be sure that she will be able to get to second appt. I called and checked with vascular and she will only be there no more than 45 min. Pt agreeable to come in am.

## 2019-11-08 ENCOUNTER — Encounter (INDEPENDENT_AMBULATORY_CARE_PROVIDER_SITE_OTHER): Payer: Self-pay

## 2019-11-08 ENCOUNTER — Other Ambulatory Visit: Payer: Self-pay

## 2019-11-08 ENCOUNTER — Encounter (INDEPENDENT_AMBULATORY_CARE_PROVIDER_SITE_OTHER): Payer: Self-pay | Admitting: Vascular Surgery

## 2019-11-08 ENCOUNTER — Other Ambulatory Visit
Admission: RE | Admit: 2019-11-08 | Discharge: 2019-11-08 | Disposition: A | Discharge status: Home / Self Care | Payer: Managed Care, Other (non HMO) | Source: Ambulatory Visit | Attending: Vascular Surgery | Admitting: Vascular Surgery

## 2019-11-08 ENCOUNTER — Encounter: Payer: Self-pay | Admitting: Oncology

## 2019-11-08 ENCOUNTER — Ambulatory Visit (INDEPENDENT_AMBULATORY_CARE_PROVIDER_SITE_OTHER): Payer: Managed Care, Other (non HMO) | Admitting: Vascular Surgery

## 2019-11-08 VITALS — BP 145/78 | HR 89 | Resp 16 | Wt 188.8 lb

## 2019-11-08 DIAGNOSIS — E782 Mixed hyperlipidemia: Secondary | ICD-10-CM | POA: Diagnosis not present

## 2019-11-08 DIAGNOSIS — I82412 Acute embolism and thrombosis of left femoral vein: Secondary | ICD-10-CM

## 2019-11-08 DIAGNOSIS — I82409 Acute embolism and thrombosis of unspecified deep veins of unspecified lower extremity: Secondary | ICD-10-CM | POA: Insufficient documentation

## 2019-11-08 DIAGNOSIS — Z01812 Encounter for preprocedural laboratory examination: Secondary | ICD-10-CM | POA: Insufficient documentation

## 2019-11-08 DIAGNOSIS — K219 Gastro-esophageal reflux disease without esophagitis: Secondary | ICD-10-CM | POA: Diagnosis not present

## 2019-11-08 DIAGNOSIS — Z20822 Contact with and (suspected) exposure to covid-19: Secondary | ICD-10-CM | POA: Insufficient documentation

## 2019-11-08 DIAGNOSIS — I10 Essential (primary) hypertension: Secondary | ICD-10-CM | POA: Diagnosis not present

## 2019-11-08 LAB — SARS CORONAVIRUS 2 (TAT 6-24 HRS): SARS Coronavirus 2: NEGATIVE

## 2019-11-08 LAB — FACTOR 5 LEIDEN

## 2019-11-08 NOTE — Progress Notes (Signed)
Hematology/Oncology Consult note [facility] [phone] [phone]  Patient Care Team: [name] as PCP - General (Family Medicine)   Name of the patient: Kristi Lee  [id number]  08/11/1956    Reason for referral-acute left lower extremity DVT   Referring physician-Jennifer Marlyn Corporal, [place]  Date of visit: 11/08/19   History of presenting illness-patient is a 64 year old female with a past medical history significant for hypertension, GERD, arthritis among other medical problems. Patient has received 1 dose of Covid vaccine.  After about 10 days of receiving Covid vaccine patient noted to have increasing left lower extremity pain and swelling.  Ultrasound of the left lower extremity showed extensive left lower extremity DVT involving left common femoral as well as popliteal and calf veins.  No preceding history of trauma or surgery.  Patient works from home but is fairly active.  She is a lifelong non-smoker.  No personal history of DVT or pregnancy losses.  Family history significant for DVT in her mother.  Patient is currently on Xarelto and tolerating it well.  She still has some ongoing left lower extremity pain but reports that it is slowly improving.  Her appetite and weight have remained stable.  Denies any other new aches and pains anywhere.  She is up-to-date with her colonoscopy and mammograms.  ECOG PS- 1  Pain scale- 4   Review of systems- Review of Systems  Constitutional: Negative for chills, fever, malaise/fatigue and weight loss.  HENT: Negative for congestion, ear discharge and nosebleeds.   Eyes: Negative for blurred vision.  Respiratory: Negative for cough, hemoptysis, sputum production, shortness of breath and wheezing.   Cardiovascular: Negative for chest pain, palpitations, orthopnea and claudication.  Gastrointestinal: Negative for abdominal pain, blood in stool, constipation, diarrhea, heartburn,  melena, nausea and vomiting.  Genitourinary: Negative for dysuria, flank pain, frequency, hematuria and urgency.  Musculoskeletal: Negative for back pain, joint pain and myalgias.       Left lower extremity pain and swelling  Skin: Negative for rash.  Neurological: Negative for dizziness, tingling, focal weakness, seizures, weakness and headaches.  Endo/Heme/Allergies: Does not bruise/bleed easily.  Psychiatric/Behavioral: Negative for depression and suicidal ideas. The patient does not have insomnia.     No Known Allergies  Patient Active Problem List   Diagnosis Date Noted  . Burning sensation 10/17/2019  . Itchy scalp 10/17/2019  . Occipital neuralgia of right side 10/17/2019  . Tingling 10/17/2019  . Hyperlipidemia, mixed 10/10/2016  . Stable angina pectoris ([place]) 10/10/2016  . Adaptation reaction 01/27/2015  . Back pain, chronic 01/27/2015  . CN (constipation) 01/27/2015  . Abnormal LFTs 01/27/2015  . Benign essential HTN 01/27/2015  . Gastro-esophageal reflux disease without esophagitis 01/27/2015  . Personal history of arthritis 01/27/2015  . Insomnia 01/27/2015  . Scoliosis 01/27/2015  . FOM (frequency of micturition) 01/27/2015     Past Medical History:  Diagnosis Date  . Depression   . GERD (gastroesophageal reflux disease)   . Hypertension      Past Surgical History:  Procedure Laterality Date  . ABDOMINAL HYSTERECTOMY    . BACK SURGERY  10/2000  . BREAST EXCISIONAL BIOPSY Right 1990's   NEG  . FEMUR FRACTURE SURGERY Right 07/15/2009   Pertrochanteric femur fracture.  [name] HIP FRACTURE SURGERY Right   . REVISION TOTAL HIP ARTHROPLASTY Left     Social History   Socioeconomic History  . Marital status: Married    Spouse name: Not on file  . Number  of children: 1  . Years of education: HS  . Highest education level: Not on file  Occupational History  . Occupation: Industrial/product designer: [employer]  Tobacco Use  . Smoking status: Never  Smoker  . Smokeless tobacco: Never Used  Substance and Sexual Activity  . Alcohol use: No  . Drug use: No  . Sexual activity: Not on file  Other Topics Concern  . Not on file  Social History Narrative  . Not on file   Social Determinants of Health   Financial Resource Strain:   . Difficulty of Paying Living Expenses:   Food Insecurity:   . Worried About Charity fundraiser in the Last Year:   . Arboriculturist in the Last Year:   Transportation Needs:   . Film/video editor (Medical):   [name] Lack of Transportation (Non-Medical):   Physical Activity:   . Days of Exercise per Week:   . Minutes of Exercise per Session:   Stress:   . Feeling of Stress :   Social Connections:   . Frequency of Communication with Friends and Family:   . Frequency of Social Gatherings with Friends and Family:   . Attends Religious Services:   . Active Member of Clubs or Organizations:   . Attends Archivist Meetings:   [name] Marital Status:   Intimate Partner Violence:   . Fear of Current or Ex-Partner:   . Emotionally Abused:   [name] Physically Abused:   . Sexually Abused:      Family History  Problem Relation Age of Onset  . Osteoarthritis Mother   . Lung cancer Father   . Diabetes Brother   . Breast cancer Neg Hx      Current Outpatient Medications:  .  atenolol (TENORMIN) 25 MG tablet, Take 1 tablet (25 mg total) by mouth daily., Disp: 90 tablet, Rfl: 3 .  betamethasone dipropionate 0.05 % cream, Apply 1 application topically as needed., Disp: , Rfl:  .  Biotin 10000 MCG TABS, Take 1 tablet by mouth 1 day or 1 dose., Disp: , Rfl:  .  celecoxib (CELEBREX) 200 MG capsule, Take 200 mg by mouth daily., Disp: , Rfl:  .  fexofenadine (ALLEGRA) 180 MG tablet, Take 180 mg by mouth daily., Disp: , Rfl:  .  gabapentin (NEURONTIN) 300 MG capsule, Take 300 mg by mouth 2 (two) times daily., Disp: , Rfl:  .  omeprazole (PRILOSEC) 20 MG capsule, TAKE 1 CAPSULE BY MOUTH  DAILY, Disp: 90  capsule, Rfl: 3 .  tolterodine (DETROL) 2 MG tablet, TAKE 1 TABLET BY MOUTH  TWICE DAILY, Disp: 180 tablet, Rfl: 3 .  triamterene-hydrochlorothiazide (MAXZIDE-25) 37.5-25 MG tablet, TAKE 1 TABLET BY MOUTH  DAILY, Disp: 90 tablet, Rfl: 1 .  venlafaxine XR (EFFEXOR-XR) 75 MG 24 hr capsule, Take 1 capsule (75 mg total) by mouth daily., Disp: 90 capsule, Rfl: 3 .  Rivaroxaban (XARELTO) 15 MG TABS tablet, Take 15 mg by mouth 2 (two) times daily with a meal., Disp: , Rfl:  .  traZODone (DESYREL) 100 MG tablet, Take 100 mg by mouth at bedtime., Disp: , Rfl:    Physical exam:  Vitals:   11/05/19 1333  BP: (!) 193/102  Pulse: 85  Temp: (!) 97.2 F (36.2 C)  TempSrc: Tympanic  SpO2: 100%  Weight: 188 lb 12.8 oz (85.6 kg)   Physical Exam HENT:     Head: Normocephalic and atraumatic.  Eyes:  Pupils: Pupils are equal, round, and reactive to light.  Cardiovascular:     Rate and Rhythm: Normal rate and regular rhythm.     Heart sounds: Normal heart sounds.  Pulmonary:     Effort: Pulmonary effort is normal.     Breath sounds: Normal breath sounds.  Abdominal:     General: Bowel sounds are normal.     Palpations: Abdomen is soft.  Musculoskeletal:     Cervical back: Normal range of motion.     Comments: Entire left lower extremity appears more swollen than the right.  Dorsalis pedis pulses palpable.  Skin:    General: Skin is warm and dry.  Neurological:     Mental Status: She is alert and oriented to person, place, and time.        CMP Latest Ref Rng & Units 11/05/2019  Glucose 70 - 99 mg/dL 107(H)  BUN 8 - 23 mg/dL 25(H)  Creatinine 0.44 - 1.00 mg/dL 1.07(H)  Sodium 135 - 145 mmol/L 139  Potassium 3.5 - 5.1 mmol/L 3.8  Chloride 98 - 111 mmol/L 102  CO2 22 - 32 mmol/L 27  Calcium 8.9 - 10.3 mg/dL 9.1  Total Protein 6.5 - 8.1 g/dL 7.8  Total Bilirubin 0.3 - 1.2 mg/dL 0.6  Alkaline Phos 38 - 126 U/L 175(H)  AST 15 - 41 U/L 39  ALT 0 - 44 U/L 39   CBC Latest Ref Rng &  Units 11/05/2019  WBC 4.0 - 10.5 K/uL 10.4  Hemoglobin 12.0 - 15.0 g/dL 12.5  Hematocrit 36.0 - 46.0 % 39.9  Platelets 150 - 400 K/uL 250    No images are attached to the encounter.  US Venous Img Lower Unilateral Left  Result Date: 10/30/2019 CLINICAL DATA:  Left lower extremity edema for 5 days EXAM: LEFT LOWER EXTREMITY VENOUS DUPLEX ULTRASOUND TECHNIQUE: Gray-scale sonography with graded compression, as well as color Doppler and duplex ultrasound were performed to evaluate the left lower extremity deep venous system from the level of the common femoral vein and including the common femoral, femoral, profunda femoral, popliteal and calf veins including the posterior tibial, peroneal and gastrocnemius veins when visible. The superficial great saphenous vein was also interrogated. Spectral Doppler was utilized to evaluate flow at rest and with distal augmentation maneuvers in the common femoral, femoral and popliteal veins. COMPARISON:  None. FINDINGS: Contralateral Common Femoral Vein: Respiratory phasicity is normal and symmetric with the symptomatic side. No evidence of thrombus. Normal compressibility. Common Femoral Vein: No evidence of thrombus. Normal compressibility, respiratory phasicity and response to augmentation. Saphenofemoral Junction: No evidence of thrombus. Normal compressibility and flow on color Doppler imaging. Profunda Femoral Vein: No evidence of thrombus. Normal compressibility and flow on color Doppler imaging. Femoral Vein: There is decreased echogenicity thrombus throughout most of the left femoral vein with loss of compression and augmentation. No appreciable Doppler signal evident in this vessel. Popliteal Vein: There is decreased echogenicity thrombus throughout the popliteal vein with loss of Doppler signal, loss of compression, and loss of augmentation. Calf Veins: Decreased echogenicity thrombus is noted throughout the calf veins with only minimal venous flow noted. No  compression or augmentation. Superficial Great Saphenous Vein: No evidence of thrombus. Normal compressibility. Venous Reflux:  None. Other Findings:  None. IMPRESSION: Extensive left lower extremity deep venous thrombosis involving most of the left common femoral vein as well as the popliteal and calf veins throughout their courses. Right common femoral vein patent. These results will be called to the ordering  clinician or representative by the Radiologist Assistant, and communication documented in the PACS or [employer]. Electronically Signed   By: Lowella Grip III M.D.   On: 10/30/2019 14:42    Assessment and plan- Patient is a 64 y.o. female referred for acute left lower extremity DVT  This is her first episode of thromboembolism which seems unprovoked.  Although she had the DVT about a week to 10 days after receiving the Covid vaccine it would be hard to attribute it to the vaccine itself.  I have however asked the patient to report this on the [facility] website.  Given her extensive lower extremity DVT I would want her to stay on anticoagulation for at least 6 months.  Xarelto seems to be a reasonable choice at this time.  I will proceed with a limited hypercoagulable work-up at this time given that she has just had an episode of DVT.  I will be checking for prothrombin gene mutation, factor V Leiden, antiphospholipid antibody syndrome along with CBC with differential and CMP. I will reserve Antithrombin III, protein C protein S testing at a later date.  Given her ongoing pain and swelling in the left leg I will refer her to vascular surgery to see if there would be a role for thrombectomy.  Patient seems to be improving slowly and may not need any intervention at this time.  Based on history and exam there are no concerning symptoms of malignancy.  She is up-to-date with her mammograms and colonoscopy.  After 6 months, I will calculate her HERDOO2 score and decide if she needs lifelong  anticoagulation at that point.  Given that patient is already therapeutic on Xarelto it would be okay for her to receive a second dose of Covid vaccine  I will see the patient in 2 weeks time for a video visit to discuss the results of hypercoagulable work-up  Thank you for this kind referral and the opportunity to participate in the care of this patient   Visit Diagnosis 1. Acute deep vein thrombosis (DVT) of femoral vein of left lower extremity (HCC)     Dr. Randa Evens, MD, MPH [facility] [id number] 11/08/2019 8:46 AM

## 2019-11-08 NOTE — Assessment & Plan Note (Signed)
Continue PPI as already ordered, this medication has been reviewed and there are no changes at this time. Avoidence of caffeine and alcohol Moderate elevation of the head of the bed  

## 2019-11-08 NOTE — Patient Instructions (Signed)
Catheter-Directed Thrombolysis Catheter-directed thrombolysis is a procedure to remove a blood clot (thrombus) from inside a blood vessel. A blood clot inside a blood vessel can block blood flow or break away and travel to another area of the body, which can cause severe problems such as heart attack or stroke. You may have this procedure to treat:  A clot blocking the blood flow in your arm or leg (limb ischemia).  A clot in a large vein (deep vein thrombosis).  A blockage in a dialysis graft.  A clot in the blood flow to your lungs (pulmonary embolism). Tell a health care provider about:  Any allergies you have.  All medicines you are taking, including vitamins, herbs, eye drops, creams, and over-the-counter medicines.  Any problems you or family members have had with anesthetic medicines.  Any blood disorders you have.  Any surgeries you have had.  Any medical conditions you have.  Whether you are pregnant or may be pregnant.  Whether you are breastfeeding.  Any recent injuries, such as a car accident or fall. What are the risks? Generally, this is a safe procedure. However, problems may occur, including:  Bleeding at the catheter site or other places in the body. Bleeding in the brain is a rare but serious complication.  Movement of the clot, requiring more treatment.  Allergic reactions to medicines or dyes.  Infection.  Bruising or swelling.  Damage to blood vessels.  Damage to the kidneys from the contrast dye. What happens before the procedure? Staying hydrated Follow instructions from your health care provider about hydration, which may include:  Up to 2 hours before the procedure - you may continue to drink clear liquids, such as water, clear fruit juice, black coffee, and plain tea.  Eating and drinking restrictions Follow instructions from your health care provider about eating and drinking, which may include:  8 hours before the procedure - stop  eating heavy meals or foods, such as meat, fried foods, or fatty foods.  6 hours before the procedure - stop eating light meals or foods, such as toast or cereal.  6 hours before the procedure - stop drinking milk or drinks that contain milk.  2 hours before the procedure - stop drinking clear liquids. Medicines Ask your health care provider about:  Changing or stopping your regular medicines. This is especially important if you are taking diabetes medicines or blood thinners.  Taking medicines such as aspirin and ibuprofen. These medicines can thin your blood. Do not take these medicines unless your health care provider tells you to take them.  Taking over-the-counter medicines, vitamins, herbs, and supplements. Tests You may have:  Blood tests to check how well your blood clots.  Tests to check your kidney function. General instructions  Do not use any products that contain nicotine or tobacco for at least 4 weeks before the procedure. These products include cigarettes, e-cigarettes, and chewing tobacco. If you need help quitting, ask your health care provider.  Plan to have someone take you home from the hospital or clinic.  Ask your health care provider what steps will be taken to help prevent infection. These may include washing your skin with a germ-killing soap. What happens during the procedure?   An IV will be inserted into one of your veins.  You will be given one or more of the following: ? A medicine to help you relax (sedative). ? A medicine to numb the surgical area (local anesthetic). This is given as an injection near   the incision. ? A medicine to make you fall asleep (general anesthetic).  A small incision will be made in your groin area or in your wrist.  A catheter will be placed through the incision and into a blood vessel that leads to the clot.  The catheter will be moved through the blood vessel toward the clot.  A dye, also called contrast dye, will  be injected through the catheter as it is moved forward. A type of X-ray (fluoroscopy) will be done to guide the movement of the catheter. The dye makes the catheter easy to see during imaging.  When the catheter reaches the clot, your health care provider will break up the clot by doing one of the following: ? Injecting a clot-dissolving medicine through the catheter. ? Threading a device through the catheter and using that device to break up the clot.  The catheter may be left in place to deliver clot-dissolving medicine during the next 24-72 hours.  Your incision may be covered with a bandage (dressing). The procedure may vary among health care providers and hospitals. What happens after the procedure?  You may need to stay in bed at the hospital to receive clot-dissolving medicine through your catheter.  Your blood pressure, heart rate, breathing rate, and blood oxygen level will be monitored until you leave the hospital or clinic.  You may continue to receive fluids and medicines through an IV. Your IV will also be used to remove blood for testing.  You will be given pain medicine as needed.  You may need to wear compression stockings or a compression sleeve on your arm or leg. These garments help to prevent blood clots and reduce swelling.  Your catheter and IV will be removed after treatment for the clot is complete. Summary  Catheter-directed thrombolysis is a procedure to remove a blood clot from inside a blood vessel.  Before the procedure, follow your health care provider's instructions about eating and drinking restrictions. You may be asked to stop eating and drinking several hours before the procedure.  During this procedure, a catheter is inserted through your skin into a blood vessel that leads to the clot.  The clot is either dissolved with medicine or broken up with a device. You may need to stay in the hospital for 24-72 hours while medicine continues to dissolve the  clot. This information is not intended to replace advice given to you by your health care provider. Make sure you discuss any questions you have with your health care provider. Document Revised: 02/11/2019 Document Reviewed: 02/11/2019 Elsevier Patient Education  2020 [employer]  

## 2019-11-08 NOTE — Assessment & Plan Note (Signed)
lipid control important in reducing the progression of atherosclerotic disease.   

## 2019-11-08 NOTE — Progress Notes (Signed)
Patient ID: Kristi Lee, female   DOB: 05-08-1956, 64 y.o.   MRN: [id number]  Chief Complaint  Patient presents with  . New Patient (Initial Visit)    ref Kristi Lee stat eval for thrombectomy    HPI Kristi Lee is a 64 y.o. female.  I am asked to see the patient by Dr. Janese Lee for evaluation of extensive left lower extremity DVT.  A little over a week ago, the patient began having marked swelling in the left leg.  There was no clear inciting event or causative factor.  There was no prolonged immobility, long trips, recent surgery, or trauma.  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  Her swelling is a little better now than it was initially after starting anticoagulation few days ago.  She was seen yesterday by hematology who referred her over stat and we are seeing her today to discuss whether or not intervention would be of benefit.  No chest pain or shortness of breath.  No current right leg symptoms.  No fevers or chills.     Past Medical History:  Diagnosis Date  . Depression   . GERD (gastroesophageal reflux disease)   . Hypertension     Past Surgical History:  Procedure Laterality Date  . ABDOMINAL HYSTERECTOMY    . BACK SURGERY  10/2000  . BREAST EXCISIONAL BIOPSY Right 1990's   NEG  . FEMUR FRACTURE SURGERY Right 07/15/2009   Pertrochanteric femur fracture.  Kristi Lee HIP FRACTURE SURGERY Right   . REVISION TOTAL HIP ARTHROPLASTY Left      Family History  Problem Relation Age of Onset  . Osteoarthritis Mother   . Lung cancer Father   . Diabetes Brother   . Breast cancer Neg Hx     Social History   Tobacco Use  . Smoking status: Never Smoker  . Smokeless tobacco: Never Used  Substance Use Topics  . Alcohol use: No  . Drug use: No    No Known Allergies  Current Outpatient Medications  Medication Sig Dispense Refill  . atenolol (TENORMIN) 25 MG tablet Take 1 tablet (25  mg total) by mouth daily. 90 tablet 3  . betamethasone dipropionate 0.05 % cream Apply 1 application topically as needed.    . Biotin 10000 MCG TABS Take 1 tablet by mouth 1 day or 1 dose.    . celecoxib (CELEBREX) 200 MG capsule Take 200 mg by mouth daily.    . fexofenadine (ALLEGRA) 180 MG tablet Take 180 mg by mouth daily.    Kristi Lee gabapentin (NEURONTIN) 300 MG capsule Take 300 mg by mouth 2 (two) times daily.    Kristi Lee omeprazole (PRILOSEC) 20 MG capsule TAKE 1 CAPSULE BY MOUTH  DAILY 90 capsule 3  . Rivaroxaban (XARELTO) 15 MG TABS tablet Take 15 mg by mouth 2 (two) times daily with a meal.    . tolterodine (DETROL) 2 MG tablet TAKE 1 TABLET BY MOUTH  TWICE DAILY 180 tablet 3  . traZODone (DESYREL) 100 MG tablet Take 100 mg by mouth at bedtime.    . triamterene-hydrochlorothiazide (MAXZIDE-25) 37.5-25 MG tablet TAKE 1 TABLET BY MOUTH  DAILY 90 tablet 1  . venlafaxine XR (EFFEXOR-XR) 75 MG 24 hr capsule Take 1 capsule (75 mg total) by mouth daily. 90 capsule 3   No current facility-administered medications for this visit.      REVIEW OF SYSTEMS (  Negative unless checked)  Constitutional: [] Weight loss  [] Fever  [] Chills Cardiac: [] Chest pain   [] Chest pressure   [] Palpitations   [] Shortness of breath when laying flat   [] Shortness of breath at rest   [] Shortness of breath with exertion. Vascular:  [] Pain in legs with walking   [] Pain in legs at rest   [] Pain in legs when laying flat   [] Claudication   [] Pain in feet when walking  [] Pain in feet at rest  [] Pain in feet when laying flat   [x] History of DVT   [x] Phlebitis   [] Swelling in legs   [] Varicose veins   [] Non-healing ulcers Pulmonary:   [] Uses home oxygen   [] Productive cough   [] Hemoptysis   [] Wheeze  [] COPD   [] Asthma Neurologic:  [] Dizziness  [] Blackouts   [] Seizures   [] History of stroke   [] History of TIA  [] Aphasia   [] Temporary blindness   [] Dysphagia   [] Weakness or numbness in arms   [] Weakness or numbness in legs Musculoskeletal:   [] Arthritis   [] Joint swelling   [] Joint pain   [] Low back pain Hematologic:  [] Easy bruising  [] Easy bleeding   [] Hypercoagulable state   [] Anemic  [] Hepatitis Gastrointestinal:  [] Blood in stool   [] Vomiting blood  [x] Gastroesophageal reflux/heartburn   [] Abdominal pain Genitourinary:  [] Chronic kidney disease   [] Difficult urination  [] Frequent urination  [] Burning with urination   [] Hematuria Skin:  [] Rashes   [] Ulcers   [] Wounds Psychological:  [] History of anxiety   [x]  History of major depression.    Physical Exam BP (!) 145/78 (BP Location: Right Arm)   Pulse 89   Resp 16   Wt 188 lb 12.8 oz (85.6 kg)   BMI 30.47 kg/m  Gen:  WD/WN, NAD Head: [place]/AT, No temporalis wasting.  Ear/Nose/Throat: Hearing grossly intact, nares w/o erythema or drainage, oropharynx w/o Erythema/Exudate Eyes: Conjunctiva clear, sclera non-icteric  Neck: trachea midline.  No JVD.  Pulmonary:  Good air movement, respirations not labored, no use of accessory muscles  Cardiac: RRR, no JVD Vascular:  Vessel Right Left  Radial Palpable Palpable                          DP 2+ 1+  PT 1+ 1+   Gastrointestinal:. No masses, surgical incisions, or scars. Musculoskeletal: M/S 5/5 throughout.  Extremities without ischemic changes.  No deformity or atrophy. Trace RLE edema, 2+ LLE edema. Neurologic: Sensation grossly intact in extremities.  Symmetrical.  Speech is fluent. Motor exam as listed above. Psychiatric: Judgment intact, Mood & affect appropriate for pt's clinical situation. Dermatologic: No rashes or ulcers noted.  No cellulitis or open wounds.    Radiology US Venous Img Lower Unilateral Left  Result Date: 10/30/2019 CLINICAL DATA:  Left lower extremity edema for 5 days EXAM: LEFT LOWER EXTREMITY VENOUS DUPLEX ULTRASOUND TECHNIQUE: Gray-scale sonography with graded compression, as well as color Doppler and duplex ultrasound were performed to evaluate the left lower extremity deep venous system  from the level of the common femoral vein and including the common femoral, femoral, profunda femoral, popliteal and calf veins including the posterior tibial, peroneal and gastrocnemius veins when visible. The superficial great saphenous vein was also interrogated. Spectral Doppler was utilized to evaluate flow at rest and with distal augmentation maneuvers in the common femoral, femoral and popliteal veins. COMPARISON:  None. FINDINGS: Contralateral Common Femoral Vein: Respiratory phasicity is normal and symmetric with the symptomatic side. No evidence of thrombus. Normal compressibility. Common  Femoral Vein: No evidence of thrombus. Normal compressibility, respiratory phasicity and response to augmentation. Saphenofemoral Junction: No evidence of thrombus. Normal compressibility and flow on color Doppler imaging. Profunda Femoral Vein: No evidence of thrombus. Normal compressibility and flow on color Doppler imaging. Femoral Vein: There is decreased echogenicity thrombus throughout most of the left femoral vein with loss of compression and augmentation. No appreciable Doppler signal evident in this vessel. Popliteal Vein: There is decreased echogenicity thrombus throughout the popliteal vein with loss of Doppler signal, loss of compression, and loss of augmentation. Calf Veins: Decreased echogenicity thrombus is noted throughout the calf veins with only minimal venous flow noted. No compression or augmentation. Superficial Great Saphenous Vein: No evidence of thrombus. Normal compressibility. Venous Reflux:  None. Other Findings:  None. IMPRESSION: Extensive left lower extremity deep venous thrombosis involving most of the left common femoral vein as well as the popliteal and calf veins throughout their courses. Right common femoral vein patent. These results will be called to the ordering clinician or representative by the Radiologist Assistant, and communication documented in the PACS or [employer].  Electronically Signed   By: Lowella Grip III M.D.   On: 10/30/2019 14:42    Labs Recent Results (from the past 2160 hour(s))  Comprehensive metabolic panel     Status: Abnormal   Collection Time: 11/05/19  2:19 PM  Result Value Ref Range   Sodium 139 135 - 145 mmol/L   Potassium 3.8 3.5 - 5.1 mmol/L   Chloride 102 98 - 111 mmol/L   CO2 27 22 - 32 mmol/L   Glucose, Bld 107 (H) 70 - 99 mg/dL    Comment: Glucose reference range applies only to samples taken after fasting for at least 8 hours.   BUN 25 (H) 8 - 23 mg/dL   Creatinine, Ser 1.07 (H) 0.44 - 1.00 mg/dL   Calcium 9.1 8.9 - 10.3 mg/dL   Total Protein 7.8 6.5 - 8.1 g/dL   Albumin 4.5 3.5 - 5.0 g/dL   AST 39 15 - 41 U/L   ALT 39 0 - 44 U/L   Alkaline Phosphatase 175 (H) 38 - 126 U/L   Total Bilirubin 0.6 0.3 - 1.2 mg/dL   GFR calc non Af Amer 55 (L) >60 mL/min   GFR calc Af Amer >60 >60 mL/min   Anion gap 10 5 - 15    Comment: Performed at [facility], [address] [place], [place] [address]  CBC with Differential/Platelet     Status: Abnormal   Collection Time: 11/05/19  2:19 PM  Result Value Ref Range   WBC 10.4 4.0 - 10.5 K/uL   RBC 4.72 3.87 - 5.11 MIL/uL   Hemoglobin 12.5 12.0 - 15.0 g/dL   HCT 39.9 36.0 - 46.0 %   MCV 84.5 80.0 - 100.0 fL   MCH 26.5 26.0 - 34.0 pg   MCHC 31.3 30.0 - 36.0 g/dL   RDW 15.9 (H) 11.5 - 15.5 %   Platelets 250 150 - 400 K/uL   nRBC 0.0 0.0 - 0.2 %   Neutrophils Relative % 65 %   Neutro Abs 6.7 1.7 - 7.7 K/uL   Lymphocytes Relative 19 %   Lymphs Abs 2.0 0.7 - 4.0 K/uL   Monocytes Relative 11 %   Monocytes Absolute 1.1 (H) 0.1 - 1.0 K/uL   Eosinophils Relative 2 %   Eosinophils Absolute 0.3 0.0 - 0.5 K/uL   Basophils Relative 2 %   Basophils Absolute 0.2 (  H) 0.0 - 0.1 K/uL   Immature Granulocytes 1 %   Abs Immature Granulocytes 0.15 (H) 0.00 - 0.07 K/uL    Comment: Performed at [facility], [address] [place], [place] [address]  Lupus anticoagulant      Status: Abnormal   Collection Time: 11/05/19  2:19 PM  Result Value Ref Range   dPT 45.6 0.0 - 55.0 sec   dPT Confirm Ratio 0.80 0.00 - 1.40 Ratio   Thrombin Time 20.6 0.0 - 23.0 sec   PTT Lupus Anticoagulant 29.0 0.0 - 51.9 sec   DRVVT 74.1 (H) 0.0 - 47.0 sec   Lupus Anticoag Interp Comment:     Comment: (NOTE) No lupus anticoagulant was detected. These results are consistent with specific inhibitors to one or more common pathway factors (X, V, II or fibrinogen). Performed At: [facility] [place], [place] [id number] Rush Farmer MD [id number]   Cardiolipin antibodies, IgG, IgM, IgA     Status: None   Collection Time: 11/05/19  2:19 PM  Result Value Ref Range   Anticardiolipin IgG <9 0 - 14 GPL U/mL    Comment: (NOTE)                          Negative:              <15                          Indeterminate:     15 - 20                          Low-Med Positive: >20 - 80                          High Positive:         >80    Anticardiolipin IgM <9 0 - 12 MPL U/mL    Comment: (NOTE)                          Negative:              <13                          Indeterminate:     13 - 20                          Low-Med Positive: >20 - 80                          High Positive:         >80    Anticardiolipin IgA <9 0 - 11 APL U/mL    Comment: (NOTE)                          Negative:              <12                          Indeterminate:     12 - 20  Low-Med Positive: >20 - 80                          High Positive:         >80 Performed At: [facility] [place], [place] [id number] Rush Farmer MD [id number]   Hexagonal Phase Phospholipid     Status: None   Collection Time: 11/05/19  2:19 PM  Result Value Ref Range   Hex Phosph Neut Test 0 0 - 11 sec    Comment: (NOTE) Performed At: [facility] [place], [place] [id number] Rush Farmer MD [id number]   Hex  phase phospholipid reflex     Status: None   Collection Time: 11/05/19  2:19 PM  Result Value Ref Range   Hex phase phospholipid comment Comment     Comment: (NOTE) Results do not indicate the presence of a Lupus Anticoagulant: abnormal high screening results (PTT-LA, dRVVT, mixing studies), may be due to medication (heparin, warfarin, aspirin), Factor inhibitors, anticardiolipin antibodies, or poor specimen integrity. Performed At: [facility] [place], [place] [id number] Rush Farmer MD [id number]   dRVVT Mix     Status: Abnormal   Collection Time: 11/05/19  2:19 PM  Result Value Ref Range   dRVVT Mix 69.3 (H) 0.0 - 40.4 sec    Comment: (NOTE) Performed At: [facility] [place], [place] [id number] Rush Farmer MD [id number]   dRVVT Confirm     Status: None   Collection Time: 11/05/19  2:19 PM  Result Value Ref Range   dRVVT Confirm 1.1 0.8 - 1.2 ratio    Comment: (NOTE) Performed At: [facility] [address] [place], [place] [id number] Rush Farmer MD [id number]     Assessment/Plan:  Benign essential HTN blood pressure control important in reducing the progression of atherosclerotic disease. On appropriate oral medications.   Hyperlipidemia, mixed lipid control important in reducing the progression of atherosclerotic disease.    Gastro-esophageal reflux disease without esophagitis Continue PPI as already ordered, this medication has been reviewed and there are no changes at this time. Avoidence of caffeine and alcohol Moderate elevation of the head of the bed   DVT (deep venous thrombosis) (HCC) The patient has a left lower extremity DVT which is quite extensive and acute.  She has a clot up to the visualized veins which is the common femoral vein.  Unfortunately, our hospital only evaluates the femoral veins and does not evaluate the iliac veins but I suspect that could be involved as well.   She has a previous history of DVT many years ago and likely has some sort of underlying hypercoagulable condition as this was unprovoked which is more worrisome.  At this point, I would recommend thrombectomy and this will be done as soon as possible.  With Covid testing and restrictions, we will try to get her test today and get her done for the next available appointment which will be Monday.  I discussed the risks and benefits the procedure.  I discussed the placement of an IVC filter to catch any clot as a possibility.  This would be removed in 4 to 8 weeks after the procedure if it is placed.  I have discussed the reason and rationale for the procedure.  I discussed she will likely get postphlebitic symptoms even if we do the procedure, but I would expect them to be less.  She voices her understanding and desires to  proceed with left lower extremity venous thrombectomy      Leotis Pain 11/08/2019, 9:16 AM   This note was created with Dragon medical transcription system.  Any errors from dictation are unintentional.

## 2019-11-08 NOTE — Assessment & Plan Note (Signed)
The patient has a left lower extremity DVT which is quite extensive and acute.  She has a clot up to the visualized veins which is the common femoral vein.  Unfortunately, our hospital only evaluates the femoral veins and does not evaluate the iliac veins but I suspect that could be involved as well.  She has a previous history of DVT many years ago and likely has some sort of underlying hypercoagulable condition as this was unprovoked which is more worrisome.  At this point, I would recommend thrombectomy and this will be done as soon as possible.  With Covid testing and restrictions, we will try to get her test today and get her done for the next available appointment which will be Monday.  I discussed the risks and benefits the procedure.  I discussed the placement of an IVC filter to catch any clot as a possibility.  This would be removed in 4 to 8 weeks after the procedure if it is placed.  I have discussed the reason and rationale for the procedure.  I discussed she will likely get postphlebitic symptoms even if we do the procedure, but I would expect them to be less.  She voices her understanding and desires to proceed with left lower extremity venous thrombectomy

## 2019-11-08 NOTE — Assessment & Plan Note (Signed)
blood pressure control important in reducing the progression of atherosclerotic disease. On appropriate oral medications.  

## 2019-11-10 ENCOUNTER — Other Ambulatory Visit (INDEPENDENT_AMBULATORY_CARE_PROVIDER_SITE_OTHER): Payer: Self-pay | Admitting: Nurse Practitioner

## 2019-11-11 ENCOUNTER — Encounter
Admission: RE | Disposition: A | Discharge status: Home / Self Care | Payer: Self-pay | Source: Home / Self Care | Attending: Vascular Surgery

## 2019-11-11 ENCOUNTER — Ambulatory Visit
Admission: RE | Admit: 2019-11-11 | Discharge: 2019-11-11 | Disposition: A | Discharge status: Home / Self Care | Payer: Managed Care, Other (non HMO) | Attending: Vascular Surgery | Admitting: Vascular Surgery

## 2019-11-11 ENCOUNTER — Encounter: Payer: Self-pay | Admitting: Vascular Surgery

## 2019-11-11 ENCOUNTER — Other Ambulatory Visit: Payer: Self-pay

## 2019-11-11 DIAGNOSIS — E782 Mixed hyperlipidemia: Secondary | ICD-10-CM | POA: Diagnosis not present

## 2019-11-11 DIAGNOSIS — Z7901 Long term (current) use of anticoagulants: Secondary | ICD-10-CM | POA: Insufficient documentation

## 2019-11-11 DIAGNOSIS — I10 Essential (primary) hypertension: Secondary | ICD-10-CM | POA: Insufficient documentation

## 2019-11-11 DIAGNOSIS — F329 Major depressive disorder, single episode, unspecified: Secondary | ICD-10-CM | POA: Insufficient documentation

## 2019-11-11 DIAGNOSIS — I82432 Acute embolism and thrombosis of left popliteal vein: Secondary | ICD-10-CM

## 2019-11-11 DIAGNOSIS — K219 Gastro-esophageal reflux disease without esophagitis: Secondary | ICD-10-CM | POA: Diagnosis not present

## 2019-11-11 DIAGNOSIS — Z79899 Other long term (current) drug therapy: Secondary | ICD-10-CM | POA: Diagnosis not present

## 2019-11-11 DIAGNOSIS — I82402 Acute embolism and thrombosis of unspecified deep veins of left lower extremity: Secondary | ICD-10-CM | POA: Insufficient documentation

## 2019-11-11 DIAGNOSIS — I82412 Acute embolism and thrombosis of left femoral vein: Secondary | ICD-10-CM

## 2019-11-11 HISTORY — PX: PERIPHERAL VASCULAR THROMBECTOMY: CATH118306

## 2019-11-11 LAB — PROTHROMBIN GENE MUTATION

## 2019-11-11 SURGERY — PERIPHERAL VASCULAR THROMBECTOMY
Anesthesia: Moderate Sedation | Laterality: Left

## 2019-11-11 MED ORDER — ALTEPLASE 2 MG IJ SOLR
INTRAMUSCULAR | Status: AC
  Filled 2019-11-11: qty 8

## 2019-11-11 MED ORDER — METHYLPREDNISOLONE SODIUM SUCC 125 MG IJ SOLR: 125.0000 mg | Freq: Once | INTRAMUSCULAR | Status: DC | PRN

## 2019-11-11 MED ORDER — MIDAZOLAM HCL 2 MG/ML PO SYRP: 8.0000 mg | ORAL_SOLUTION | Freq: Once | ORAL | Status: DC | PRN

## 2019-11-11 MED ORDER — HEPARIN SODIUM (PORCINE) 1000 UNIT/ML IJ SOLN
INTRAMUSCULAR | Status: AC
  Filled 2019-11-11: qty 1

## 2019-11-11 MED ORDER — DIPHENHYDRAMINE HCL 50 MG/ML IJ SOLN: 50.0000 mg | Freq: Once | INTRAMUSCULAR | Status: DC | PRN

## 2019-11-11 MED ORDER — FENTANYL CITRATE (PF) 100 MCG/2ML IJ SOLN
INTRAMUSCULAR | Status: DC | PRN
  Administered 2019-11-11: 50 ug via INTRAVENOUS
  Administered 2019-11-11 (×2): 25 ug via INTRAVENOUS

## 2019-11-11 MED ORDER — MIDAZOLAM HCL 2 MG/2ML IJ SOLN
INTRAMUSCULAR | Status: DC | PRN
  Administered 2019-11-11 (×2): 0.5 mg via INTRAVENOUS
  Administered 2019-11-11: 2 mg via INTRAVENOUS

## 2019-11-11 MED ORDER — HYDROMORPHONE HCL 1 MG/ML IJ SOLN: 1.0000 mg | Freq: Once | INTRAMUSCULAR | Status: DC | PRN

## 2019-11-11 MED ORDER — MIDAZOLAM HCL 5 MG/5ML IJ SOLN
INTRAMUSCULAR | Status: AC
  Filled 2019-11-11: qty 5

## 2019-11-11 MED ORDER — FENTANYL CITRATE (PF) 100 MCG/2ML IJ SOLN
INTRAMUSCULAR | Status: AC
  Filled 2019-11-11: qty 2

## 2019-11-11 MED ORDER — CEFAZOLIN SODIUM-DEXTROSE 2-4 GM/100ML-% IV SOLN
2.0000 g | Freq: Once | INTRAVENOUS | Status: AC
  Administered 2019-11-11: 2 g via INTRAVENOUS

## 2019-11-11 MED ORDER — ALTEPLASE 2 MG IJ SOLR
INTRAMUSCULAR | Status: DC | PRN
  Administered 2019-11-11: 8 mg

## 2019-11-11 MED ORDER — ONDANSETRON HCL 4 MG/2ML IJ SOLN: 4.0000 mg | Freq: Four times a day (QID) | INTRAMUSCULAR | Status: DC | PRN

## 2019-11-11 MED ORDER — FAMOTIDINE 20 MG PO TABS: 40.0000 mg | ORAL_TABLET | Freq: Once | ORAL | Status: DC | PRN

## 2019-11-11 MED ORDER — SODIUM CHLORIDE 0.9 % IV SOLN: INTRAVENOUS | Status: DC

## 2019-11-11 SURGICAL SUPPLY — 18 items
BALLN DORADO 8X100X80 (BALLOONS) ×2
BALLN ULTRVRSE 10X60X75 (BALLOONS) ×2
BALLOON DORADO 8X100X80 (BALLOONS) ×1 IMPLANT
BALLOON ULTRVRSE 10X60X75 (BALLOONS) ×1 IMPLANT
CANISTER PENUMBRA ENGINE (MISCELLANEOUS) ×2 IMPLANT
CANNULA 5F STIFF (CANNULA) ×2 IMPLANT
CATH BEACON 5 .035 65 KMP TIP (CATHETERS) ×2 IMPLANT
CATH INDIGO 12 HTORQ 115 (CATHETERS) ×2 IMPLANT
DEVICE PRESTO INFLATION (MISCELLANEOUS) ×2 IMPLANT
DEVICE TORQUE .025-.038 (MISCELLANEOUS) ×2 IMPLANT
GLIDEWIRE ADV .035X260CM (WIRE) ×2 IMPLANT
PACK ANGIOGRAPHY (CUSTOM PROCEDURE TRAY) ×2 IMPLANT
SHEATH BRITE TIP 6FRX11 (SHEATH) ×2 IMPLANT
SHEATH PINNACLE 11FRX10 (SHEATH) ×2 IMPLANT
VALVE HEMO TOUHY BORST Y (ADAPTER) ×2 IMPLANT
WIRE J 3MM .035X145CM (WIRE) ×2 IMPLANT
WIRE MAGIC TORQUE 260C (WIRE) ×2 IMPLANT
WIRE NITINOL .018 (WIRE) ×2 IMPLANT

## 2019-11-11 NOTE — Discharge Instructions (Signed)
Thrombectomy Care After This sheet gives you information about how to care for yourself after your procedure. Your health care provider may also give you more specific instructions. If you have problems or questions, contact your health care provider. What can I expect after the procedure? After the procedure, it is common to have:  Bruising or mild discomfort in the area where the IV was inserted (insertion site).  Follow these instructions at home: Eating and drinking  Follow instructions from your health care provider about eating or drinking restrictions.  Drink a lot of fluids for the first several days after the procedure, as directed by your health care provider. This helps to wash (flush) the contrast out of your body. Examples of healthy fluids include water or low-calorie drinks. General instructions  Check your left femoral venous site  for: ? Redness, swelling, or pain. ? Fluid or blood. ? Warmth. ? Pus or a bad smell.  Take over-the-counter and prescription medicines only as told by your health care provider.  Rest and return to your normal activities as told by your health care provider. Ask your health care provider what activities are safe for you.  Do not drive for 24 hours if you were given a medicine to help you relax (sedative), or until your health care provider approves.  Keep all follow-up visits as told by your health care provider. This is important.  You may remove the dressing from your left femoral vein site in 2 days  Leave coban wrap on right leg for one day removing it tomorrow  No soaking in tub of water, pool or hot tub for one week to prevent infection. Contact a health care provider if:  Your skin becomes itchy or you develop a rash or hives.  You have a fever that does not get better with medicine.  You feel nauseous.  You vomit.  You have redness, swelling, or pain around the insertion site.  You have fluid or blood coming from the  insertion site.  Your insertion area feels warm to the touch.  You have pus or a bad smell coming from the insertion site. Get help right away if:  You have difficulty breathing or shortness of breath.  You develop chest pain.  You faint.  You feel very dizzy. These symptoms may represent a serious problem that is an emergency. Do not wait to see if the symptoms will go away. Get medical help right away. Call your local emergency services (911 in the [place] Do not drive yourself to the hospital. Summary  After your procedure, it is common to have bruising or mild discomfort in the area where the IV was inserted.  You should check your IV insertion area every day for signs of infection.  Take over-the-counter and prescription medicines only as told by your health care provider.  You should drink a lot of fluids for the first several days after the procedure to help flush the contrast from your body. This information is not intended to replace advice given to you by your health care provider. Make sure you discuss any questions you have with your health care provider. Document Released: 05/22/2013 Document Revised: 06/25/2016 Document Reviewed: 06/25/2016 Elsevier Interactive Patient Education  2017 Elsevier Inc.Moderate Conscious Sedation, Adult, Care After These instructions provide you with information about caring for yourself after your procedure. Your health care provider may also give you more specific instructions. Your treatment has been planned according to current medical practices, but problems sometimes occur.  Call your health care provider if you have any problems or questions after your procedure. What can I expect after the procedure? After your procedure, it is common:  To feel sleepy for several hours.  To feel clumsy and have poor balance for several hours.  To have poor judgment for several hours.  To vomit if you eat too soon. Follow these instructions at  home: For at least 24 hours after the procedure:   Do not: ? Participate in activities where you could fall or become injured. ? Drive. ? Use heavy machinery. ? Drink alcohol. ? Take sleeping pills or medicines that cause drowsiness. ? Make important decisions or sign legal documents. ? Take care of children on your own.  Rest. Eating and drinking  Follow the diet recommended by your health care provider.  If you vomit: ? Drink water, juice, or soup when you can drink without vomiting. ? Make sure you have little or no nausea before eating solid foods. General instructions  Have a responsible adult stay with you until you are awake and alert.  Take over-the-counter and prescription medicines only as told by your health care provider.  If you smoke, do not smoke without supervision.  Keep all follow-up visits as told by your health care provider. This is important. Contact a health care provider if:  You keep feeling nauseous or you keep vomiting.  You feel light-headed.  You develop a rash.  You have a fever. Get help right away if:  You have trouble breathing. This information is not intended to replace advice given to you by your health care provider. Make sure you discuss any questions you have with your health care provider. Document Revised: 07/14/2017 Document Reviewed: 11/21/2015 Elsevier Patient Education  2020 [employer].

## 2019-11-11 NOTE — Op Note (Signed)
[facility]   OPERATIVE NOTE   PRE-OPERATIVE DIAGNOSIS: extensive left lower extremity DVT  POST-OPERATIVE DIAGNOSIS: same   PROCEDURE: 1. US guidance for vascular access to left popliteal vein 2. Catheter placement into the left common iliac vein from left popliteal approach approach 3. IVC gram and left lower extremity venogram 4.   Catheter directed thrombolysis with 8 mg of tpa to the left common femoral vein, superficial femoral vein, and popliteal vein 5. Mechanical thrombectomy to left popliteal vein, superficial femoral vein, and common femoral vein with the penumbra cat 12 device 6. PTA of left popliteal vein, superficial femoral vein, and common femoral vein with 8 mm balloon and then balloon angioplasty of the left superficial femoral vein and common femoral vein with 10 mm balloon   SURGEON: Leotis Pain, MD  ASSISTANT(S): none  ANESTHESIA: local with moderate conscious sedation for 45 minutes minutes using 3 mg of Versed and 100 mcg of Fentanyl  ESTIMATED BLOOD LOSS: 250 cc  FINDING(S): 1. Extensive clot from the left popliteal vein up through the entire superficial femoral vein and common femoral vein.  Iliac veins were patent as was the IVC.  SPECIMEN(S): none  INDICATIONS:  Patient is a 64 y.o. female who presents with an extensive left lower extremity DVT. Patient has marked leg swelling and pain. Venous intervention is performed to reduce the symtpoms and avoid long term postphlebitic symptoms.   DESCRIPTION: After obtaining full informed written consent, the patient was brought back to the vascular suite and placed supine upon the table.Moderate conscious sedation was administered during a face to face encounter with the patient throughout the procedure with my supervision of the RN administering medicines and monitoring the patient's vital signs, pulse oximetry, telemetry and mental status throughout from the start of the  procedure until the patient was taken to the recovery room. After obtaining adequate anesthesia, the patient was prepped and draped in the standard fashion. The patient was then placed into the prone position. The left popliteal vein was then accessed under direct ultrasound guidance without difficulty with a micropuncture needle and a permanent image was recorded. It was thrombosed in this area, and getting the wire access was somewhat tedious.  I then upsized to an 6Fr sheath over a J wire. Imaging showed extensive DVT with minimal flow. A Kumpe catheter and Magic tourque wire were then advanced into the CFV and images were performed. There was thrombus in the common femoral vein. I was able to cross the thrombus and stenosis and advance into the left external and common iliac vein which was patent on imaging.  The IVC was also imaged and was found to be widely patent. I then used the Kumpe catheter and instilled 8 mg of tpa throughout the left common femoral, superficial femoral, and popliteal veins.  I then upsized to an 57 French sheath in the left popliteal vein.  After this dwelled, I used the Penumbra Cat 12 catheter and evacuated about 100 cc of effluent with mechanical thrombectomy throughout the iliac veins, CFV, SFV, and popliteal veins. This had mild to moderate improvement. I then treated the popliteal vein, SFV, and CFV with inflations with a 10 cm length 8 mm diameter angioplasty balloon to open a channel. This resulted in some resolution of the thrombus and improved flow. I then made another pass with the penumbra cat 12 device evacuating more thrombus.  At this point, the only areas of residual thrombus or narrowing were in the mid to  proximal superficial femoral vein and the distal common femoral vein.  I upsized to 10 mm diameter angioplasty balloon and made 3 inflations from the common femoral vein down to the mid superficial femoral vein anywhere from 6 to 12 atm for a minute.   Another pass was made with the penumbra cat 12 catheter as well and at the end, there was only a mild amount of residual thrombus in the mid to proximal superficial femoral vein and distal common femoral vein with a marked improvement and relatively brisk flow.  I then elected to terminate the procedure. The sheath was removed and a dressing was placed. She was taken to the recovery room in stable condition having tolerated the procedure well.   COMPLICATIONS: None  CONDITION: Stable  Leotis Pain 11/11/2019 9:26 AM

## 2019-11-11 NOTE — H&P (Signed)
[facility] History & Physical Update  The patient was interviewed and re-examined.  The patient's previous History and Physical has been reviewed and is unchanged.  There is no change in the plan of care. We plan to proceed with the scheduled procedure.  Leotis Pain, MD  11/11/2019, 7:58 AM

## 2019-11-18 ENCOUNTER — Encounter: Payer: Self-pay | Admitting: Oncology

## 2019-11-18 ENCOUNTER — Other Ambulatory Visit: Payer: Self-pay | Admitting: Physician Assistant

## 2019-11-18 ENCOUNTER — Inpatient Hospital Stay: Payer: Managed Care, Other (non HMO) | Attending: Oncology | Admitting: Oncology

## 2019-11-18 DIAGNOSIS — I824Y2 Acute embolism and thrombosis of unspecified deep veins of left proximal lower extremity: Secondary | ICD-10-CM

## 2019-11-18 DIAGNOSIS — Z6825 Body mass index (BMI) 25.0-25.9, adult: Secondary | ICD-10-CM

## 2019-11-18 MED ORDER — RIVAROXABAN 20 MG PO TABS: 20.0000 mg | ORAL_TABLET | Freq: Every day | ORAL | 5 refills | Status: DC

## 2019-11-18 NOTE — Telephone Encounter (Signed)
Requested medication (s) are due for refill today: yes  Requested medication (s) are on the active medication list:   Last refill:  04/19/2019  Future visit scheduled: no  Notes to clinic:  not delegated    Requested Prescriptions  Pending Prescriptions Disp Refills   phentermine 30 MG capsule [Pharmacy Med Name: PHENTERMINE  30MG   CAP] 90 capsule     Sig: TAKE 1 CAPSULE BY MOUTH IN  THE MORNING      Not Delegated - Gastroenterology:  Antiobesity Agents Failed - 11/18/2019  8:36 AM      Failed - This refill cannot be delegated      Passed - Last BP in normal range    BP Readings from Last 1 Encounters:  11/11/19 130/85          Passed - Last Heart Rate in normal range    Pulse Readings from Last 1 Encounters:  11/11/19 63          Passed - Valid encounter within last 12 months    Recent Outpatient Visits           2 weeks ago Acute deep vein thrombosis (DVT) of proximal vein of left lower extremity [facility])   [place], Wendee Beavers, PA-C   5 months ago Neck pain   [facility] Carles Collet M, [place]   7 months ago Annual physical exam   [facility] [place], Clearnce Sorrel, [place]   2 years ago Annual physical exam   [facility] [place], Clearnce Sorrel, [place]   3 years ago Annual physical exam   [place], Clearnce Sorrel, [place]       Future Appointments             Today Sindy Guadel[place], [facility]

## 2019-11-18 NOTE — Telephone Encounter (Signed)
LOV  04/15/19 with you, 2 acute visits with AP  LRF  Not currently on med list

## 2019-11-18 NOTE — Progress Notes (Signed)
Patient has video visit this afternoon she is doing well no complaints

## 2019-11-21 NOTE — Progress Notes (Signed)
I connected with Kristi Lee on 11/21/19 at  2:45 PM EDT by video enabled telemedicine visit and verified that I am speaking with the correct person using two identifiers.   I discussed the limitations, risks, security and privacy concerns of performing an evaluation and management service by telemedicine and the availability of in-person appointments. I also discussed with the patient that there may be a patient responsible charge related to this service. The patient expressed understanding and agreed to proceed.  Other persons participating in the visit and their role in the encounter:  none  Patient's location:  home Provider's location:  work  Risk analyst Complaint: Discuss results of hypercoagulable work-up further management  History of present illness: patient is a 64 year old female with a past medical history significant for hypertension, GERD, arthritis among other medical problems. Patient has received 1 dose of Covid vaccine.  After about 10 days of receiving Covid vaccine patient noted to have increasing left lower extremity pain and swelling.  Ultrasound of the left lower extremity showed extensive left lower extremity DVT involving left common femoral as well as popliteal and calf veins.  No preceding history of trauma or surgery.  Patient works from home but is fairly active.  She is a lifelong non-smoker.  No history of pregnancy losses.  Reports that she may have had a blood clot in her leg back in her 20s but does not remember taking any blood thinners for the same.  Also does not remember if she was on birth control at that time. Family history significant for DVT in her mother.  Patient is currently on Xarelto and tolerating it well.  She still has some ongoing left lower extremity pain but reports that it is slowly improving.  Her appetite and weight have remained stable.  Denies any other new aches and pains anywhere.  She is up-to-date with her colonoscopy and  mammograms.  Results of hypercoagulable work-up did not show any evidence of prothrombin gene mutation or evidence of antiphospholipid antibody syndrome.  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  Protein C, protein S and Antithrombin III levels have not been done due to recent acute DVT  Patient also seen by vascular surgery and will be underwent catheter directed thrombolysis and thrombectomy on 11/11/2019   Interval history patient tolerated the thrombectomy procedure well.  Reports ongoing pain and swelling in her left lower extremity which is gradually improving   Review of Systems  Constitutional: Negative for chills, fever, malaise/fatigue and weight loss.  HENT: Negative for congestion, ear discharge and nosebleeds.   Eyes: Negative for blurred vision.  Respiratory: Negative for cough, hemoptysis, sputum production, shortness of breath and wheezing.   Cardiovascular: Negative for chest pain, palpitations, orthopnea and claudication.  Gastrointestinal: Negative for abdominal pain, blood in stool, constipation, diarrhea, heartburn, melena, nausea and vomiting.  Genitourinary: Negative for dysuria, flank pain, frequency, hematuria and urgency.  Musculoskeletal: Negative for back pain, joint pain and myalgias.       Left lower extremity pain and swelling  Skin: Negative for rash.  Neurological: Negative for dizziness, tingling, focal weakness, seizures, weakness and headaches.  Endo/Heme/Allergies: Does not bruise/bleed easily.  Psychiatric/Behavioral: Negative for depression and suicidal ideas. The patient does not have insomnia.     No Known Allergies  Past Medical History:  Diagnosis Date  . Depression   . GERD (gastroesophageal reflux disease)   . Hypertension     Past Surgical History:  Procedure Laterality Date  . ABDOMINAL  HYSTERECTOMY    . BACK SURGERY  10/2000  . BREAST EXCISIONAL BIOPSY Right 1990's   NEG  . FEMUR FRACTURE SURGERY Right 07/15/2009    Pertrochanteric femur fracture.  [name] HIP FRACTURE SURGERY Right   . PERIPHERAL VASCULAR THROMBECTOMY Left 11/11/2019   Procedure: PERIPHERAL VASCULAR THROMBECTOMY;  Surgeon: Algernon Huxley, MD;  Location: [facility];  Service: Cardiovascular;  Laterality: Left;  . REVISION TOTAL HIP ARTHROPLASTY Left     Social History   Socioeconomic History  . Marital status: Married    Spouse name: Not on file  . Number of children: 1  . Years of education: HS  . Highest education level: Not on file  Occupational History  . Occupation: Industrial/product designer: [employer]  Tobacco Use  . Smoking status: Never Smoker  . Smokeless tobacco: Never Used  Substance and Sexual Activity  . Alcohol use: No  . Drug use: No  . Sexual activity: Not on file  Other Topics Concern  . Not on file  Social History Narrative   Lives at home in private residence with husband   Social Determinants of Health   Financial Resource Strain:   . Difficulty of Paying Living Expenses:   Food Insecurity:   . Worried About Charity fundraiser in the Last Year:   . Arboriculturist in the Last Year:   Transportation Needs:   . Film/video editor (Medical):   [name] Lack of Transportation (Non-Medical):   Physical Activity:   . Days of Exercise per Week:   . Minutes of Exercise per Session:   Stress:   . Feeling of Stress :   Social Connections:   . Frequency of Communication with Friends and Family:   . Frequency of Social Gatherings with Friends and Family:   . Attends Religious Services:   . Active Member of Clubs or Organizations:   . Attends Archivist Meetings:   [name] Marital Status:   Intimate Partner Violence:   . Fear of Current or Ex-Partner:   . Emotionally Abused:   [name] Physically Abused:   . Sexually Abused:     Family History  Problem Relation Age of Onset  . Osteoarthritis Mother   . Lung cancer Father   . Diabetes Brother   . Breast cancer Neg Hx      Current  Outpatient Medications:  .  atenolol (TENORMIN) 25 MG tablet, Take 1 tablet (25 mg total) by mouth daily., Disp: 90 tablet, Rfl: 3 .  betamethasone dipropionate 0.05 % cream, Apply 1 application topically as needed., Disp: , Rfl:  .  Biotin 10000 MCG TABS, Take 1 tablet by mouth 1 day or 1 dose., Disp: , Rfl:  .  fexofenadine (ALLEGRA) 180 MG tablet, Take 180 mg by mouth daily., Disp: , Rfl:  .  gabapentin (NEURONTIN) 300 MG capsule, Take 300 mg by mouth 2 (two) times daily., Disp: , Rfl:  .  omeprazole (PRILOSEC) 20 MG capsule, TAKE 1 CAPSULE BY MOUTH  DAILY, Disp: 90 capsule, Rfl: 3 .  tolterodine (DETROL) 2 MG tablet, TAKE 1 TABLET BY MOUTH  TWICE DAILY, Disp: 180 tablet, Rfl: 3 .  traZODone (DESYREL) 100 MG tablet, Take 100 mg by mouth at bedtime., Disp: , Rfl:  .  triamterene-hydrochlorothiazide (MAXZIDE-25) 37.5-25 MG tablet, TAKE 1 TABLET BY MOUTH  DAILY, Disp: 90 tablet, Rfl: 1 .  venlafaxine XR (EFFEXOR-XR) 75 MG 24 hr capsule, Take 1 capsule (75 mg total)  by mouth daily., Disp: 90 capsule, Rfl: 3 .  celecoxib (CELEBREX) 200 MG capsule, Take 200 mg by mouth daily., Disp: , Rfl:  .  rivaroxaban (XARELTO) 20 MG TABS tablet, Take 1 tablet (20 mg total) by mouth daily with supper., Disp: 30 tablet, Rfl: 5  PERIPHERAL VASCULAR CATHETERIZATION  Result Date: 11/11/2019 See op note  US Venous Img Lower Unilateral Left  Result Date: 10/30/2019 CLINICAL DATA:  Left lower extremity edema for 5 days EXAM: LEFT LOWER EXTREMITY VENOUS DUPLEX ULTRASOUND TECHNIQUE: Gray-scale sonography with graded compression, as well as color Doppler and duplex ultrasound were performed to evaluate the left lower extremity deep venous system from the level of the common femoral vein and including the common femoral, femoral, profunda femoral, popliteal and calf veins including the posterior tibial, peroneal and gastrocnemius veins when visible. The superficial great saphenous vein was also interrogated. Spectral  Doppler was utilized to evaluate flow at rest and with distal augmentation maneuvers in the common femoral, femoral and popliteal veins. COMPARISON:  None. FINDINGS: Contralateral Common Femoral Vein: Respiratory phasicity is normal and symmetric with the symptomatic side. No evidence of thrombus. Normal compressibility. Common Femoral Vein: No evidence of thrombus. Normal compressibility, respiratory phasicity and response to augmentation. Saphenofemoral Junction: No evidence of thrombus. Normal compressibility and flow on color Doppler imaging. Profunda Femoral Vein: No evidence of thrombus. Normal compressibility and flow on color Doppler imaging. Femoral Vein: There is decreased echogenicity thrombus throughout most of the left femoral vein with loss of compression and augmentation. No appreciable Doppler signal evident in this vessel. Popliteal Vein: There is decreased echogenicity thrombus throughout the popliteal vein with loss of Doppler signal, loss of compression, and loss of augmentation. Calf Veins: Decreased echogenicity thrombus is noted throughout the calf veins with only minimal venous flow noted. No compression or augmentation. Superficial Great Saphenous Vein: No evidence of thrombus. Normal compressibility. Venous Reflux:  None. Other Findings:  None. IMPRESSION: Extensive left lower extremity deep venous thrombosis involving most of the left common femoral vein as well as the popliteal and calf veins throughout their courses. Right common femoral vein patent. These results will be called to the ordering clinician or representative by the Radiologist Assistant, and communication documented in the PACS or [employer]. Electronically Signed   By: Lowella Grip III M.D.   On: 10/30/2019 14:42    No images are attached to the encounter.   CMP Latest Ref Rng & Units 11/05/2019  Glucose 70 - 99 mg/dL 107(H)  BUN 8 - 23 mg/dL 25(H)  Creatinine 0.44 - 1.00 mg/dL 1.07(H)  Sodium 135 - 145  mmol/L 139  Potassium 3.5 - 5.1 mmol/L 3.8  Chloride 98 - 111 mmol/L 102  CO2 22 - 32 mmol/L 27  Calcium 8.9 - 10.3 mg/dL 9.1  Total Protein 6.5 - 8.1 g/dL 7.8  Total Bilirubin 0.3 - 1.2 mg/dL 0.6  Alkaline Phos 38 - 126 U/L 175(H)  AST 15 - 41 U/L 39  ALT 0 - 44 U/L 39   CBC Latest Ref Rng & Units 11/05/2019  WBC 4.0 - 10.5 K/uL 10.4  Hemoglobin 12.0 - 15.0 g/dL 12.5  Hematocrit 36.0 - 46.0 % 39.9  Platelets 150 - 400 K/uL 250     Observation/objective: Appears in no acute distress on video visit today.  Breathing is nonlabored  Assessment and plan: Patient is a 64 year old female with history of extensive left lower extremity DVT unprovoked.  She is currently on Xarelto and will discuss results  of hypercoagulable work-up  Results of hypercoagulable work-up show evidence of single factor V Leiden mutation which typically does not change anticoagulation management.  However patient gives possible history of blood clot back in her 20s but does not remember what type of blood clot it was and if she ever took blood thinners for the same.  She will be asking her mother for more details.  At this time I would like patient to take Xarelto for at least 6 months. At the end of 6 months I will plan to get protein C, protein C test, Antithrombin III as well as D-dimer.  If D-dimer levels remain elevated at the end of 6 months and/or prior history of DVT in her 4s is confirmed, I will plan to keep her on lifelong anticoagulation  Patient is also seen vascular surgery and underwent thrombectomy as well as a temporary IVC filter placement which will be removed after a few weeks.  I have renewed her Xarelto prescription today   Follow-up instructions:  I discussed the assessment and treatment plan with the patient. The patient was provided an opportunity to ask questions and all were answered. The patient agreed with the plan and demonstrated an understanding of the instructions.   The patient was  advised to call back or seek an in-person evaluation if the symptoms worsen or if the condition fails to improve as anticipated.    Visit Diagnosis: 1. DVT, lower extremity, proximal, acute, left ([place])     Dr. Randa Evens, MD, MPH [facility] at [facility] Tel- [phone] 11/21/2019 6:09 AM

## 2019-11-22 ENCOUNTER — Telehealth: Payer: Self-pay | Admitting: Physician Assistant

## 2019-11-22 NOTE — Telephone Encounter (Signed)
Per patient she is not taking the Celebrex.

## 2019-11-22 NOTE — Telephone Encounter (Signed)
Can we see if patient is still taking Celebrex?  If so she should stop taking while on xarelto and only use tylenol. Celebrex increases risk of GI bleed while on blood thinners.

## 2019-12-02 ENCOUNTER — Other Ambulatory Visit (INDEPENDENT_AMBULATORY_CARE_PROVIDER_SITE_OTHER): Payer: Self-pay | Admitting: Vascular Surgery

## 2019-12-02 ENCOUNTER — Ambulatory Visit (INDEPENDENT_AMBULATORY_CARE_PROVIDER_SITE_OTHER): Payer: Managed Care, Other (non HMO) | Admitting: Nurse Practitioner

## 2019-12-02 ENCOUNTER — Ambulatory Visit (INDEPENDENT_AMBULATORY_CARE_PROVIDER_SITE_OTHER): Payer: Managed Care, Other (non HMO)

## 2019-12-02 ENCOUNTER — Encounter (INDEPENDENT_AMBULATORY_CARE_PROVIDER_SITE_OTHER): Payer: Self-pay | Admitting: Nurse Practitioner

## 2019-12-02 ENCOUNTER — Other Ambulatory Visit: Payer: Self-pay

## 2019-12-02 VITALS — BP 115/73 | HR 93 | Resp 16 | Wt 188.0 lb

## 2019-12-02 DIAGNOSIS — I82412 Acute embolism and thrombosis of left femoral vein: Secondary | ICD-10-CM

## 2019-12-02 DIAGNOSIS — Z9862 Peripheral vascular angioplasty status: Secondary | ICD-10-CM | POA: Diagnosis not present

## 2019-12-02 DIAGNOSIS — I10 Essential (primary) hypertension: Secondary | ICD-10-CM

## 2019-12-03 ENCOUNTER — Encounter (INDEPENDENT_AMBULATORY_CARE_PROVIDER_SITE_OTHER): Payer: Self-pay | Admitting: Nurse Practitioner

## 2019-12-03 NOTE — Progress Notes (Addendum)
Subjective:    Patient ID: Kristi Lee, female    DOB: [birth year], 64 y.o.   MRN: [id number] Chief Complaint  Patient presents with  . Follow-up    [facility] 3week post thrombectomy    The patient presents today after left lower extremity thrombectomy following discovery of a DVT.  The patient did have an extensive DVT present her thrombectomy included:  1. US guidance for vascular access to left popliteal vein 2. Catheter placement into the left common iliac vein from left popliteal approach approach 3. IVC gram and left lower extremity venogram 4.   Catheter directed thrombolysis with 8 mg of tpa to the left common femoral vein, superficial femoral vein, and popliteal vein 5. Mechanical thrombectomy to left popliteal vein, superficial femoral vein, and common femoral vein with the penumbra cat 12 device 6. PTA of left popliteal vein, superficial femoral vein, and common femoral vein with 8 mm balloon and then balloon angioplasty of the left superficial femoral vein and common femoral vein with 10 mm balloon  The patient denies ever having any extensive swelling or pain with her lower extremity.  The patient denies having any other previous DVTs.  She is unsure of any precipitating factors.  However after her DVT she was referred to hematology and it was found that she has heterozygous factor V Leiden disorder.  Some tests were unable to be run due to the fact that the patient was still on anticoagulation.  Since her thrombectomy the patient states that her lower extremity feels much better no pain or swelling that she perceives.  The patient has taking her anticoagulation without difficulty and endorses not missing any doses.  Overall she is doing well following her procedure.  Noninvasive studies show there is a softly echogenic thrombus seen throughout the superficial femoral vein with minimal recannulized flow in the proximal and mid regions.  No flow seen in the distal  superficial femoral vein or popliteal veins.  There is a partially thrombosed saphenous vein not total occlusion of the gastrocnemius veins.  These are findings posterior back to me however there also improved post thrombectomy.   Review of Systems  Neurological: Positive for numbness.  All other systems reviewed and are negative.      Objective:   Physical Exam Vitals reviewed.  HENT:     Head: Normocephalic.  Cardiovascular:     Rate and Rhythm: Normal rate and regular rhythm.  Pulmonary:     Effort: Pulmonary effort is normal.     Breath sounds: Normal breath sounds.  Musculoskeletal:     Cervical back: Normal range of motion.     Left lower leg: Edema present.  Skin:    Capillary Refill: Capillary refill takes less than 2 seconds.  Neurological:     Mental Status: She is alert and oriented to person, place, and time.  Psychiatric:        Mood and Affect: Mood normal.        Behavior: Behavior normal.        Thought Content: Thought content normal.        Judgment: Judgment normal.     BP 115/73 (BP Location: Right Arm)   Pulse 93   Resp 16   Wt 188 lb (85.3 kg)   BMI 30.34 kg/m   Past Medical History:  Diagnosis Date  . Depression   . GERD (gastroesophageal reflux disease)   . Hypertension     Social History   Socioeconomic History  .  Marital status: Married    Spouse name: Not on file  . Number of children: 1  . Years of education: HS  . Highest education level: Not on file  Occupational History  . Occupation: Industrial/product designer: [employer]  Tobacco Use  . Smoking status: Never Smoker  . Smokeless tobacco: Never Used  Substance and Sexual Activity  . Alcohol use: No  . Drug use: No  . Sexual activity: Not on file  Other Topics Concern  . Not on file  Social History Narrative   Lives at home in private residence with husband   Social Determinants of Health   Financial Resource Strain:   . Difficulty of Paying Living Expenses:     Food Insecurity:   . Worried About Charity fundraiser in the Last Year:   . Arboriculturist in the Last Year:   Transportation Needs:   . Film/video editor (Medical):   [name] Lack of Transportation (Non-Medical):   Physical Activity:   . Days of Exercise per Week:   . Minutes of Exercise per Session:   Stress:   . Feeling of Stress :   Social Connections:   . Frequency of Communication with Friends and Family:   . Frequency of Social Gatherings with Friends and Family:   . Attends Religious Services:   . Active Member of Clubs or Organizations:   . Attends Archivist Meetings:   [name] Marital Status:   Intimate Partner Violence:   . Fear of Current or Ex-Partner:   . Emotionally Abused:   [name] Physically Abused:   . Sexually Abused:     Past Surgical History:  Procedure Laterality Date  . ABDOMINAL HYSTERECTOMY    . BACK SURGERY  10/2000  . BREAST EXCISIONAL BIOPSY Right 1990's   NEG  . FEMUR FRACTURE SURGERY Right 07/15/2009   Pertrochanteric femur fracture.  [name] HIP FRACTURE SURGERY Right   . PERIPHERAL VASCULAR THROMBECTOMY Left 11/11/2019   Procedure: PERIPHERAL VASCULAR THROMBECTOMY;  Surgeon: Algernon Huxley, MD;  Location: [facility];  Service: Cardiovascular;  Laterality: Left;  . REVISION TOTAL HIP ARTHROPLASTY Left     Family History  Problem Relation Age of Onset  . Osteoarthritis Mother   . Lung cancer Father   . Diabetes Brother   . Breast cancer Neg Hx     No Known Allergies     Assessment & Plan:   1. Acute deep vein thrombosis (DVT) of femoral vein of left lower extremity (HCC) Recommend:   No surgery or intervention at this point in time.  IVC filter is not indicated at present.  Patient will continue on anticoagulation and plan to remain on for at least the next 6 months.  Depending on follow-up tests with hematology patient may need to be on anticoagulation for a year.  Elevation was stressed, use of a recliner was  discussed.  I have had a long discussion with the patient regarding DVT and post phlebitic changes such as swelling and why it  causes symptoms such as pain.  The patient will wear graduated compression stockings class 1 (20-30 mmHg) on a daily basis.  Patient will also elevate lower extremities to assist with edema.  The patient will continue anticoagulation for now as there have not been any problems or complications at this point.    We will have the patient return in 6 months in order to assess progression of DVT as well as to  discuss whether or not to stop or continue anticoagulation.  - VAS [place] LOWER EXTREMITY VENOUS (DVT); Future  2. Benign essential HTN Blood pressure control today.  Reviewed medications.  No changes needed today.   Current Outpatient Medications on File Prior to Visit  Medication Sig Dispense Refill  . atenolol (TENORMIN) 25 MG tablet Take 1 tablet (25 mg total) by mouth daily. 90 tablet 3  . betamethasone dipropionate 0.05 % cream Apply 1 application topically as needed.    . Biotin 10000 MCG TABS Take 1 tablet by mouth 1 day or 1 dose.    . carbamazepine (TEGRETOL XR) 100 MG 12 hr tablet     . celecoxib (CELEBREX) 200 MG capsule Take 200 mg by mouth daily.    . fexofenadine (ALLEGRA) 180 MG tablet Take 180 mg by mouth daily.    [name] gabapentin (NEURONTIN) 300 MG capsule Take 300 mg by mouth 2 (two) times daily.    [name] omeprazole (PRILOSEC) 20 MG capsule TAKE 1 CAPSULE BY MOUTH  DAILY 90 capsule 3  . rivaroxaban (XARELTO) 20 MG TABS tablet Take 1 tablet (20 mg total) by mouth daily with supper. 30 tablet 5  . tolterodine (DETROL) 2 MG tablet TAKE 1 TABLET BY MOUTH  TWICE DAILY 180 tablet 3  . traZODone (DESYREL) 100 MG tablet Take 100 mg by mouth at bedtime.    . triamterene-hydrochlorothiazide (MAXZIDE-25) 37.5-25 MG tablet TAKE 1 TABLET BY MOUTH  DAILY 90 tablet 1  . venlafaxine XR (EFFEXOR-XR) 75 MG 24 hr capsule Take 1 capsule (75 mg total) by mouth daily. 90  capsule 3   No current facility-administered medications on file prior to visit.    There are no Patient Instructions on file for this visit. No follow-ups on file.   Kris Hartmann, NP

## 2019-12-30 ENCOUNTER — Telehealth: Payer: Self-pay | Admitting: *Deleted

## 2019-12-30 NOTE — Telephone Encounter (Signed)
Call returned to patient and advised of the dosing and Dr Elroy Channel response, she verbalized understandng and thanked me for calling her back

## 2019-12-30 NOTE — Telephone Encounter (Signed)
Patient called reporting that she got her prescription for Xarelto and that it is 20 mg and she thought the dose would be much higher than that,. She is requesting a return call to discuss her dose. [phone]

## 2019-12-30 NOTE — Telephone Encounter (Signed)
Dose is 15 mg BID for 21 days followed by 20 mg once daily. So what she is getting is the right dose. Please let her know. Thanks.

## 2020-01-16 ENCOUNTER — Telehealth (INDEPENDENT_AMBULATORY_CARE_PROVIDER_SITE_OTHER): Payer: Self-pay

## 2020-01-16 NOTE — Telephone Encounter (Signed)
Patient left voicemail stating that she had got mix up with her medication and has ran out. The patient is requesting a refill for Xarelto to be sent to [facility]. I left a voicemail informing the patient that there were 5 refills attached to prescription.

## 2020-01-16 NOTE — Telephone Encounter (Signed)
The patient return a call back and informed that she is not able to refill Xarelto until the 02/03/20. Patient informed that she is out of town and she did not have her medicine at that time. I informed the patient to call back on Monday when she is back in town.

## 2020-01-19 ENCOUNTER — Emergency Department: Payer: Managed Care, Other (non HMO)

## 2020-01-19 ENCOUNTER — Other Ambulatory Visit: Payer: Self-pay

## 2020-01-19 ENCOUNTER — Emergency Department
Admission: EM | Admit: 2020-01-19 | Discharge: 2020-01-19 | Disposition: A | Discharge status: Home / Self Care | Payer: Managed Care, Other (non HMO) | Attending: Emergency Medicine | Admitting: Emergency Medicine

## 2020-01-19 ENCOUNTER — Encounter: Payer: Self-pay | Admitting: Emergency Medicine

## 2020-01-19 DIAGNOSIS — R21 Rash and other nonspecific skin eruption: Secondary | ICD-10-CM | POA: Diagnosis not present

## 2020-01-19 DIAGNOSIS — D649 Anemia, unspecified: Secondary | ICD-10-CM

## 2020-01-19 DIAGNOSIS — R0602 Shortness of breath: Secondary | ICD-10-CM | POA: Diagnosis present

## 2020-01-19 DIAGNOSIS — I10 Essential (primary) hypertension: Secondary | ICD-10-CM | POA: Insufficient documentation

## 2020-01-19 DIAGNOSIS — Z7901 Long term (current) use of anticoagulants: Secondary | ICD-10-CM | POA: Diagnosis not present

## 2020-01-19 DIAGNOSIS — R2243 Localized swelling, mass and lump, lower limb, bilateral: Secondary | ICD-10-CM | POA: Diagnosis not present

## 2020-01-19 DIAGNOSIS — R062 Wheezing: Secondary | ICD-10-CM | POA: Insufficient documentation

## 2020-01-19 DIAGNOSIS — Z79899 Other long term (current) drug therapy: Secondary | ICD-10-CM | POA: Insufficient documentation

## 2020-01-19 DIAGNOSIS — J810 Acute pulmonary edema: Secondary | ICD-10-CM

## 2020-01-19 LAB — BASIC METABOLIC PANEL
Anion gap: 8 (ref 5–15)
BUN: 19 mg/dL (ref 8–23)
CO2: 25 mmol/L (ref 22–32)
Calcium: 8.4 mg/dL — ABNORMAL LOW (ref 8.9–10.3)
Chloride: 110 mmol/L (ref 98–111)
Creatinine, Ser: 0.95 mg/dL (ref 0.44–1.00)
GFR calc Af Amer: >60 mL/min (ref >60)
GFR calc non Af Amer: >60 mL/min (ref >60)
Glucose, Bld: 117 mg/dL — ABNORMAL HIGH (ref 70–99)
Potassium: 4.1 mmol/L (ref 3.5–5.1)
Sodium: 143 mmol/L (ref 135–145)

## 2020-01-19 LAB — CBC WITH DIFFERENTIAL/PLATELET
Abs Immature Granulocytes: 0.07 10*3/uL (ref 0.00–0.07)
Basophils Absolute: 0.1 10*3/uL (ref 0.0–0.1)
Basophils Relative: 1 %
Eosinophils Absolute: 0.2 10*3/uL (ref 0.0–0.5)
Eosinophils Relative: 4 %
HCT: 30.7 % — ABNORMAL LOW (ref 36.0–46.0)
Hemoglobin: 9.5 g/dL — ABNORMAL LOW (ref 12.0–15.0)
Immature Granulocytes: 1 %
Lymphocytes Relative: 17 %
Lymphs Abs: 1 10*3/uL (ref 0.7–4.0)
MCH: 25.5 pg — ABNORMAL LOW (ref 26.0–34.0)
MCHC: 30.9 g/dL (ref 30.0–36.0)
MCV: 82.3 fL (ref 80.0–100.0)
Monocytes Absolute: 0.8 10*3/uL (ref 0.1–1.0)
Monocytes Relative: 13 %
Neutro Abs: 3.8 10*3/uL (ref 1.7–7.7)
Neutrophils Relative %: 64 %
Platelets: 183 10*3/uL (ref 150–400)
RBC: 3.73 MIL/uL — ABNORMAL LOW (ref 3.87–5.11)
RDW: 17.5 % — ABNORMAL HIGH (ref 11.5–15.5)
WBC: 6 10*3/uL (ref 4.0–10.5)
nRBC: 0 % (ref 0.0–0.2)

## 2020-01-19 LAB — BRAIN NATRIURETIC PEPTIDE: B Natriuretic Peptide: 183.4 pg/mL — ABNORMAL HIGH (ref 0.0–100.0)

## 2020-01-19 LAB — TROPONIN I (HIGH SENSITIVITY)
Troponin I (High Sensitivity): 8 ng/L (ref <18)
Troponin I (High Sensitivity): 7 ng/L (ref <18)

## 2020-01-19 MED ORDER — METHYLPREDNISOLONE SODIUM SUCC 125 MG IJ SOLR
125.0000 mg | Freq: Once | INTRAMUSCULAR | Status: AC
  Administered 2020-01-19: 125 mg via INTRAVENOUS
  Filled 2020-01-19: qty 2

## 2020-01-19 MED ORDER — IOHEXOL 350 MG/ML SOLN
75.0000 mL | Freq: Once | INTRAVENOUS | Status: AC | PRN
  Administered 2020-01-19: 75 mL via INTRAVENOUS

## 2020-01-19 MED ORDER — FUROSEMIDE 20 MG PO TABS
20.0000 mg | ORAL_TABLET | Freq: Every day | ORAL | 0 refills | Status: DC
Start: 2020-01-19 — End: 2020-01-21

## 2020-01-19 MED ORDER — FUROSEMIDE 10 MG/ML IJ SOLN
40.0000 mg | Freq: Once | INTRAMUSCULAR | Status: AC
  Administered 2020-01-19: 40 mg via INTRAVENOUS
  Filled 2020-01-19: qty 4

## 2020-01-19 MED ORDER — ALBUTEROL SULFATE (2.5 MG/3ML) 0.083% IN NEBU
2.5000 mg | INHALATION_SOLUTION | Freq: Once | RESPIRATORY_TRACT | Status: AC
  Administered 2020-01-19: 2.5 mg via RESPIRATORY_TRACT
  Filled 2020-01-19: qty 3

## 2020-01-19 NOTE — ED Provider Notes (Signed)
[facility] Emergency Department Provider Note ____________________________________________   First MD Initiated Contact with Patient 01/19/20 0730     (approximate)  I have reviewed the triage vital signs and the nursing notes.   HISTORY  Chief Complaint Shortness of Breath    HPI SYLVI RYBOLT is a 64 y.o. female with PMH as noted below including DVT for which she takes Xarelto who presents with shortness of breath, gradual onset over the last several days, and associated with wheezing.  The patient states that others noticed her making a wheezing sound about 4 days ago, and then she noticed that 3 days ago.  She states that the shortness of breath is worse when she lays flat or when she is walking around.  She denies any associated chest pain, cough, or fever.  The patient also reports some swelling to both legs over the last week and has had a rash to her extremities and chest for the last 2 weeks which is slightly itchy but not painful.  Past Medical History:  Diagnosis Date  . Depression   . GERD (gastroesophageal reflux disease)   . Hypertension     Patient Active Problem List   Diagnosis Date Noted  . DVT (deep venous thrombosis) ([place]) 11/08/2019  . Burning sensation 10/17/2019  . Itchy scalp 10/17/2019  . Occipital neuralgia of right side 10/17/2019  . Tingling 10/17/2019  . Hyperlipidemia, mixed 10/10/2016  . Stable angina pectoris ([place]) 10/10/2016  . Adaptation reaction 01/27/2015  . Back pain, chronic 01/27/2015  . CN (constipation) 01/27/2015  . Abnormal LFTs 01/27/2015  . Benign essential HTN 01/27/2015  . Gastro-esophageal reflux disease without esophagitis 01/27/2015  . Personal history of arthritis 01/27/2015  . Insomnia 01/27/2015  . Scoliosis 01/27/2015  . FOM (frequency of micturition) 01/27/2015    Past Surgical History:  Procedure Laterality Date  . ABDOMINAL HYSTERECTOMY    . BACK SURGERY  10/2000  . BREAST  EXCISIONAL BIOPSY Right 1990's   NEG  . FEMUR FRACTURE SURGERY Right 07/15/2009   Pertrochanteric femur fracture.  [name] HIP FRACTURE SURGERY Right   . PERIPHERAL VASCULAR THROMBECTOMY Left 11/11/2019   Procedure: PERIPHERAL VASCULAR THROMBECTOMY;  Surgeon: Algernon Huxley, MD;  Location: [facility];  Service: Cardiovascular;  Laterality: Left;  . REVISION TOTAL HIP ARTHROPLASTY Left     Prior to Admission medications   Medication Sig Start Date End Date Taking? Authorizing Provider  atenolol (TENORMIN) 25 MG tablet Take 1 tablet (25 mg total) by mouth daily. 10/31/19   Trinna Post, PA-C  betamethasone dipropionate 0.05 % cream Apply 1 application topically as needed.    [provider]  Biotin 10000 MCG TABS Take 1 tablet by mouth 1 day or 1 dose.    [provider]  carbamazepine (TEGRETOL XR) 100 MG 12 hr tablet  11/28/19   [provider]  celecoxib (CELEBREX) 200 MG capsule Take 200 mg by mouth daily.    [provider]  fexofenadine (ALLEGRA) 180 MG tablet Take 180 mg by mouth daily.    [provider]  furosemide (LASIX) 20 MG tablet Take 1 tablet (20 mg total) by mouth daily for 7 days. 01/19/20 01/26/20  Arta Silence, MD  gabapentin (NEURONTIN) 300 MG capsule Take 300 mg by mouth 2 (two) times daily.    [provider]  omeprazole (PRILOSEC) 20 MG capsule TAKE 1 CAPSULE BY MOUTH  DAILY 09/20/19   Mar Daring, PA-C  rivaroxaban Alveda Reasons)  20 MG TABS tablet Take 1 tablet (20 mg total) by mouth daily with supper. 11/18/19   Sindy Guadeloupe, MD  tolterodine (DETROL) 2 MG tablet TAKE 1 TABLET BY MOUTH  TWICE DAILY 04/15/19   Mar Daring, PA-C  traZODone (DESYREL) 100 MG tablet Take 100 mg by mouth at bedtime.    [provider]  triamterene-hydrochlorothiazide (MAXZIDE-25) 37.5-25 MG tablet TAKE 1 TABLET BY MOUTH  DAILY 09/20/19   Mar Daring, PA-C  venlafaxine XR (EFFEXOR-XR) 75 MG 24 hr capsule  Take 1 capsule (75 mg total) by mouth daily. 10/31/19   Trinna Post, PA-C    Allergies Patient has no known allergies.  Family History  Problem Relation Age of Onset  . Osteoarthritis Mother   . Lung cancer Father   . Diabetes Brother   . Breast cancer Neg Hx     Social History Social History   Tobacco Use  . Smoking status: Never Smoker  . Smokeless tobacco: Never Used  Substance Use Topics  . Alcohol use: No  . Drug use: No    Review of Systems  Constitutional: No fever. Eyes: No redness. ENT: No sore throat. Cardiovascular: Denies chest pain. Respiratory: Positive for shortness of breath. Gastrointestinal: No vomiting or diarrhea.  Genitourinary: Negative for dysuria.  Musculoskeletal: Negative for back pain. Skin: Negative for rash. Neurological: Negative for headache.   ____________________________________________   PHYSICAL EXAM:  VITAL SIGNS: ED Triage Vitals  Enc Vitals Group     BP 01/19/20 0559 (!) 180/85     Pulse Rate 01/19/20 0559 82     Resp 01/19/20 0559 18     Temp 01/19/20 0559 99.2 F (37.3 C)     Temp Source 01/19/20 0559 Oral     SpO2 01/19/20 0559 100 %     Weight 01/19/20 0556 185 lb (83.9 kg)     Height 01/19/20 0556 5\' 6"  (1.676 m)     Head Circumference --      Peak Flow --      Pain Score 01/19/20 0556 0     Pain Loc --      Pain Edu? --      Excl. in [place]? --     Constitutional: Alert and oriented.  Relatively well appearing and in no acute distress. Eyes: Conjunctivae are normal.  Head: Atraumatic. Nose: No congestion/rhinnorhea. Mouth/Throat: Mucous membranes are moist.   Neck: Normal range of motion.  Cardiovascular: Normal rate, regular rhythm. Grossly normal heart sounds.  Good peripheral circulation. Respiratory: Normal respiratory effort.  No retractions.  Slightly coarse breath sounds but no significant wheezing or rales. Gastrointestinal: No distention.  Musculoskeletal: 1+ bilateral lower extremity edema.   No calf or popliteal swelling or tenderness.  Extremities warm and well perfused.  Neurologic:  Normal speech and language. No gross focal neurologic deficits are appreciated.  Skin:  Skin is warm and dry.  Papular slightly erythematous blanching rash to bilateral upper and lower extremities, not involving palms and soles. Psychiatric: Mood and affect are normal. Speech and behavior are normal.  ____________________________________________   LABS (all labs ordered are listed, but only abnormal results are displayed)  Labs Reviewed  CBC WITH DIFFERENTIAL/PLATELET - Abnormal; Notable for the following components:      Result Value   RBC 3.73 (*)    Hemoglobin 9.5 (*)    HCT 30.7 (*)    MCH 25.5 (*)    RDW 17.5 (*)    All other components within normal  limits  BASIC METABOLIC PANEL - Abnormal; Notable for the following components:   Glucose, Bld 117 (*)    Calcium 8.4 (*)    All other components within normal limits  BRAIN NATRIURETIC PEPTIDE - Abnormal; Notable for the following components:   B Natriuretic Peptide 183.4 (*)    All other components within normal limits  TROPONIN I (HIGH SENSITIVITY)  TROPONIN I (HIGH SENSITIVITY)   ____________________________________________  EKG  ED ECG REPORT I, Arta Silence, the attending physician, personally viewed and interpreted this ECG.  Date: 01/19/2020 EKG Time: 0551 Rate: 91 Rhythm: normal sinus rhythm QRS Axis: Left axis Intervals: normal ST/T Wave abnormalities: normal Narrative Interpretation: no evidence of acute ischemia  ____________________________________________  RADIOLOGY  CXR: Thickened bronchial markings, no focal infiltrate CT angio chest: No acute PE.  Small pleural effusions and interstitial thickening, mild edema ____________________________________________   PROCEDURES  Procedure(s) performed: No  Procedures  Critical Care performed:  No ____________________________________________   INITIAL IMPRESSION / ASSESSMENT AND PLAN / ED COURSE  Pertinent labs & imaging results that were available during my care of the patient were reviewed by me and considered in my medical decision making (see chart for details).  64 year old female with PMH as noted above and no prior COPD or CHF history presents with shortness of breath and wheezing over the last several days, in addition to lower extremity edema for the last week.  She also has had a rash for the last few weeks.  The patient has a history of DVT (diagnosed in March) and is on Xarelto, but states that she ran out of it 6 days ago.  I reviewed the past medical records in [place].  The patient has no recent prior ED visits or admissions.  On exam she is overall well-appearing.  Her vital signs are normal except for hypertension and a borderline elevated temperature.  She does not have significant wheezing on lung exam, and has no respiratory distress or significant increased work of breathing.  There is mild bilateral lower extremity edema.  She has a papular, blanching nonspecific rash to upper and lower extremities not involving the palms and soles.  Initial lab work-up is significant for anemia which appears new for the patient.  First troponin is negative.  The chest x-ray shows findings most consistent with bronchitis.  Differential for the shortness of breath includes acute bronchitis, new onset CHF, less likely new onset COPD, or possible symptoms related to the anemia.  I have a low suspicion for PE given that the patient has no chest pain, no hypoxia or tachycardia, however she is at elevated risk with her recent DVT and since she has been off the Xarelto.  We will obtain a repeat troponin, add on a BNP, give a trial of nebs and steroid, and also obtain a CT chest both to rule out PE and to further differentiate the findings seen on chest  x-ray.  ----------------------------------------- 10:20 AM on 01/19/2020 -----------------------------------------  CT chest shows findings consistent with mild edema and small effusions, rather than consolidation/infiltrate.  The BNP is slightly elevated.  Troponins are negative.  Overall work-up favors mild pulmonary edema.  At this time, the patient is not in any distress.  Her O2 saturation is in the high 90s, and she is breathing comfortably without any difficulty.  There is no indication for admission.  She can get further work-up as an outpatient.  I will give a dose of IV Lasix here, and prescribe her for 1 week  of a low-dose of Lasix for home.  I have provided cardiology referral and instructed her to follow-up with her primary care.  I counseled the patient on the results of the work-up.  Return precautions given, she expresses understanding. ____________________________________________   FINAL CLINICAL IMPRESSION(S) / ED DIAGNOSES  Final diagnoses:  Acute pulmonary edema (HCC)      NEW MEDICATIONS STARTED DURING THIS VISIT:  New Prescriptions   FUROSEMIDE (LASIX) 20 MG TABLET    Take 1 tablet (20 mg total) by mouth daily for 7 days.     Note:  This document was prepared using Dragon voice recognition software and may include unintentional dictation errors.   Arta Silence, MD 01/19/20 1021

## 2020-01-19 NOTE — Discharge Instructions (Signed)
Take the Lasix as prescribed over the next week.  Make an appointment with your primary care doctor and with a cardiologist.  Referral information has been provided.  Return to the ER for new, worsening, or persistent severe shortness of breath, chest pain, fever, weakness or lightheadedness, or any other new or worsening symptoms that concern you.

## 2020-01-19 NOTE — ED Triage Notes (Signed)
Pt states that she has noticed being SOB since Thursday. Pt is able to carry on a telephone conversation at this his point with NAD. Pt also reports being out of her xarelto x 1 week but is supposed to pick up more on Monday.

## 2020-01-20 ENCOUNTER — Ambulatory Visit: Payer: Managed Care, Other (non HMO) | Admitting: Cardiology

## 2020-01-20 NOTE — Telephone Encounter (Signed)
Patient left a voicemail seeing if she could receive some samples for Xarelto until 6/21 for her next refill. I spoke with Eulogio Ditch NP and she recommended to call in a Rx for Xarelto 20mg  #14 into [facility] on [address] and the patient has been made aware.

## 2020-01-21 ENCOUNTER — Other Ambulatory Visit: Payer: Self-pay

## 2020-01-21 ENCOUNTER — Encounter: Payer: Self-pay | Admitting: Cardiology

## 2020-01-21 ENCOUNTER — Ambulatory Visit (INDEPENDENT_AMBULATORY_CARE_PROVIDER_SITE_OTHER): Payer: Managed Care, Other (non HMO) | Admitting: Cardiology

## 2020-01-21 VITALS — BP 164/92 | HR 73 | Ht 66.0 in | Wt 189.5 lb

## 2020-01-21 DIAGNOSIS — R600 Localized edema: Secondary | ICD-10-CM | POA: Diagnosis not present

## 2020-01-21 DIAGNOSIS — I10 Essential (primary) hypertension: Secondary | ICD-10-CM | POA: Diagnosis not present

## 2020-01-21 DIAGNOSIS — R0600 Dyspnea, unspecified: Secondary | ICD-10-CM | POA: Diagnosis not present

## 2020-01-21 DIAGNOSIS — I824Y2 Acute embolism and thrombosis of unspecified deep veins of left proximal lower extremity: Secondary | ICD-10-CM

## 2020-01-21 DIAGNOSIS — R0609 Other forms of dyspnea: Secondary | ICD-10-CM

## 2020-01-21 MED ORDER — FUROSEMIDE 20 MG PO TABS
40.0000 mg | ORAL_TABLET | Freq: Every day | ORAL | 3 refills | Status: DC
Start: 2020-01-21 — End: 2020-01-21

## 2020-01-21 MED ORDER — FUROSEMIDE 20 MG PO TABS
ORAL_TABLET | ORAL | 3 refills | Status: DC
Start: 2020-01-21 — End: 2020-02-03

## 2020-01-21 NOTE — Patient Instructions (Signed)
Medication Instructions:   Your physician has recommended you make the following change in your medication:   Increase your Lasix (Furosemide) to 40mg , 1 tablet by mouth once a day.  *If you need a refill on your cardiac medications before your next appointment, please call your pharmacy*   Lab Work:  Please have a BMP drawn in 1 week after starting new Lasix dose and fax results to [phone]. If you have labs (blood work) drawn today and your tests are completely normal, you will receive your results only by:  [place] (if you have MyChart) OR  A paper copy in the mail If you have any lab test that is abnormal or we need to change your treatment, we will call you to review the results.   Testing/Procedures:  Your physician has requested that you have an echocardiogram. Echocardiography is a painless test that uses sound waves to create images of your heart. It provides your doctor with information about the size and shape of your heart and how well your hearts chambers and valves are working. This procedure takes approximately one hour. There are no restrictions for this procedure.     Follow-Up: At [facility], you and your health needs are our priority.  As part of our continuing mission to provide you with exceptional heart care, we have created designated Provider Care Teams.  These Care Teams include your primary Cardiologist (physician) and Advanced Practice Providers (APPs -  Physician Assistants and Nurse Practitioners) who all work together to provide you with the care you need, when you need it.  We recommend signing up for the patient portal called "MyChart".  Sign up information is provided on this After Visit Summary.  MyChart is used to connect with patients for Virtual Visits (Telemedicine).  Patients are able to view lab/test results, encounter notes, upcoming appointments, etc.  Non-urgent messages can be sent to your provider as well.   To learn more  about what you can do with MyChart, go to [email or web address].    Your next appointment:   4 week(s) after echo  The format for your next appointment:   In Person  Provider:   Kate Sable, MD   Other Instructions N/A

## 2020-01-21 NOTE — Progress Notes (Signed)
Cardiology Office Note:    Date:  01/21/2020   ID:  Kristi Lee, DOB [birth year], MRN [id number]  PCP:  Mar Daring, PA-C  [facility] Cardiologist:  Kate Sable, MD  [place] Electrophysiologist:  None   Referring MD: Florian Buff*   Chief Complaint  Patient presents with  . New Patient (Initial Visit)    Follow up from [facility] ER; shortness of breath. Meds reviewed by the pt. verbally. Pt. c/o shortness of breath and LE edema as well as abdominal swelling.    Kristi Lee is a 64 y.o. female who is being seen today for the evaluation of shortness of breath at the request of Mar Daring, P*.   History of Present Illness:    Kristi Lee is a 64 y.o. female with a hx of hypertension, left lower extremity DVT on Xarelto who presents due to shortness of breath.  Patient was seen in the emergency room 2 days ago after a 4-day period with she had shortness of breath and wheezing.  Shortness of breath was associated with exertion and also when she lays flat.  She denies chest pain cough or fever.  Patient also endorses lower extremity swelling for about a week now.    In the ED, blood pressures were noted to be elevated at 180/85, EKG showed normal sinus rhythm, CT angio chest with no acute PE, but showed small pleural effusions and mild edema.  She was given a dose of IV Lasix, and started on oral Lasix 20 mg daily with recommendations for cardiology follow-up.  Her symptoms of shortness of breath have improved since starting Lasix 20 mg daily, although she still has some edema in her legs.  She works at [employer].  Patient also has erythematous rash on skin, has appointments with PCP and also dermatology coming up.  Has a history of psoriasis.  Patient was diagnosed with extensive left lower extremity DVT involving most of the left common femoral vein as well as the popliteal and calf veins in March.  She underwent catheter directed  thrombolysis with TPA by vascular surgery and started on Xarelto.   Past Medical History:  Diagnosis Date  . Depression   . GERD (gastroesophageal reflux disease)   . Hypertension     Past Surgical History:  Procedure Laterality Date  . ABDOMINAL HYSTERECTOMY    . BACK SURGERY  10/2000  . BREAST EXCISIONAL BIOPSY Right 1990's   NEG  . FEMUR FRACTURE SURGERY Right 07/15/2009   Pertrochanteric femur fracture.  [name] HIP FRACTURE SURGERY Right   . PERIPHERAL VASCULAR THROMBECTOMY Left 11/11/2019   Procedure: PERIPHERAL VASCULAR THROMBECTOMY;  Surgeon: Algernon Huxley, MD;  Location: [facility];  Service: Cardiovascular;  Laterality: Left;  . REVISION TOTAL HIP ARTHROPLASTY Left     Current Medications: Current Meds  Medication Sig  . atenolol (TENORMIN) 25 MG tablet Take 1 tablet (25 mg total) by mouth daily.  . betamethasone dipropionate 0.05 % cream Apply 1 application topically as needed.  . Biotin 10000 MCG TABS Take 1 tablet by mouth 1 day or 1 dose.  . carbamazepine (TEGRETOL XR) 100 MG 12 hr tablet   . fexofenadine (ALLEGRA) 180 MG tablet Take 180 mg by mouth daily.  . furosemide (LASIX) 20 MG tablet Take 2 tablets by mouth once daily for 1 week and then return to 1 tablet once daily.  [name] gabapentin (NEURONTIN) 300 MG capsule Take 300 mg by mouth 2 (two)  times daily.  [name] gabapentin (NEURONTIN) 400 MG capsule Take 400 mg by mouth 3 (three) times daily.  [name] omeprazole (PRILOSEC) 20 MG capsule TAKE 1 CAPSULE BY MOUTH  DAILY  . rivaroxaban (XARELTO) 20 MG TABS tablet Take 1 tablet (20 mg total) by mouth daily with supper.  . tolterodine (DETROL) 2 MG tablet TAKE 1 TABLET BY MOUTH  TWICE DAILY  . traZODone (DESYREL) 100 MG tablet Take 100 mg by mouth at bedtime.  . triamterene-hydrochlorothiazide (MAXZIDE-25) 37.5-25 MG tablet TAKE 1 TABLET BY MOUTH  DAILY  . venlafaxine XR (EFFEXOR-XR) 75 MG 24 hr capsule Take 1 capsule (75 mg total) by mouth daily.  . [DISCONTINUED] furosemide  (LASIX) 20 MG tablet Take 1 tablet (20 mg total) by mouth daily for 7 days.  . [DISCONTINUED] furosemide (LASIX) 20 MG tablet Take 2 tablets (40 mg total) by mouth daily.     Allergies:   Patient has no known allergies.   Social History   Socioeconomic History  . Marital status: Married    Spouse name: Not on file  . Number of children: 1  . Years of education: HS  . Highest education level: Not on file  Occupational History  . Occupation: Industrial/product designer: [employer]  Tobacco Use  . Smoking status: Never Smoker  . Smokeless tobacco: Never Used  Substance and Sexual Activity  . Alcohol use: No  . Drug use: No  . Sexual activity: Not on file  Other Topics Concern  . Not on file  Social History Narrative   Lives at home in private residence with husband   Social Determinants of Health   Financial Resource Strain:   . Difficulty of Paying Living Expenses:   Food Insecurity:   . Worried About Charity fundraiser in the Last Year:   . Arboriculturist in the Last Year:   Transportation Needs:   . Film/video editor (Medical):   [name] Lack of Transportation (Non-Medical):   Physical Activity:   . Days of Exercise per Week:   . Minutes of Exercise per Session:   Stress:   . Feeling of Stress :   Social Connections:   . Frequency of Communication with Friends and Family:   . Frequency of Social Gatherings with Friends and Family:   . Attends Religious Services:   . Active Member of Clubs or Organizations:   . Attends Archivist Meetings:   [name] Marital Status:      Family History: The patient's family history includes Diabetes in her brother; Lung cancer in her father; Osteoarthritis in her mother. There is no history of Breast cancer.  ROS:   Please see the history of present illness.     All other systems reviewed and are negative.  EKGs/Labs/Other Studies Reviewed:    The following studies were reviewed today:   EKG:  EKG is  ordered  today.  The ekg ordered today demonstrates normal sinus rhythm  Recent Labs: 04/26/2019: TSH 1.610 11/05/2019: ALT 39 01/19/2020: B Natriuretic Peptide 183.4; BUN 19; Creatinine, Ser 0.95; Hemoglobin 9.5; Platelets 183; Potassium 4.1; Sodium 143  Recent Lipid Panel    Component Value Date/Time   CHOL 189 04/26/2019 0910   TRIG 111 04/26/2019 0910   HDL 67 04/26/2019 0910   CHOLHDL 2.8 04/26/2019 0910   LDLCALC 102 (H) 04/26/2019 0910    Physical Exam:    VS:  BP (!) 164/92 (BP Location: Right Arm, Patient Position: Sitting, Cuff  Size: Normal)   Pulse 73   Ht 5\' 6"  (1.676 m)   Wt 189 lb 8 oz (86 kg)   SpO2 97%   BMI 30.59 kg/m     Wt Readings from Last 3 Encounters:  01/21/20 189 lb 8 oz (86 kg)  01/19/20 185 lb (83.9 kg)  12/02/19 188 lb (85.3 kg)     GEN:  Well nourished, well developed in no acute distress HEENT: Normal NECK: No JVD; No carotid bruits LYMPHATICS: No lymphadenopathy CARDIAC: RRR, no murmurs, rubs, gallops RESPIRATORY:  Clear to auscultation without rales, wheezing or rhonchi  ABDOMEN: Soft, non-tender, non-distended MUSCULOSKELETAL:  1-2+ edema; No deformity  SKIN: Warm and dry, erythematous rash. NEUROLOGIC:  Alert and oriented x 3 PSYCHIATRIC:  Normal affect   ASSESSMENT:    1. Dyspnea on exertion   2. Edema leg   3. Deep vein thrombosis (DVT) of proximal vein of left lower extremity, unspecified chronicity ([place])   4. Essential hypertension    PLAN:    In order of problems listed above:  1. Patient with dyspnea on exertion and edema on CT.  Get echocardiogram to evaluate for any cardiac dysfunction. 2. Lower extremity edema noted on exam, increase Lasix to 40 mg daily.  Check BMP in 1 week.  Patient advised to fax report of BMP as she can obtain blood work from lab stating she works at [employer]. 3. History of DVT, on Xarelto.  May need anticoagulation work-up.  I will leave this to the expertise of vascular surgery and primary care who are  following patient and managing DVT. 4. History of hypertension, continue atenolol for now.  Previous BP was normal.  Follow-up after echocardiogram.  This note was generated in part or whole with voice recognition software. Voice recognition is usually quite accurate but there are transcription errors that can and very often do occur. I apologize for any typographical errors that were not detected and corrected.  Medication Adjustments/Labs and Tests Ordered: Current medicines are reviewed at length with the patient today.  Concerns regarding medicines are outlined above.  Orders Placed This Encounter  Procedures  . Basic Metabolic Panel (BMET)  . EKG 12-Lead  . ECHOCARDIOGRAM COMPLETE   Meds ordered this encounter  Medications  . DISCONTD: furosemide (LASIX) 20 MG tablet    Sig: Take 2 tablets (40 mg total) by mouth daily.    Dispense:  30 tablet    Refill:  3  . furosemide (LASIX) 20 MG tablet    Sig: Take 2 tablets by mouth once daily for 1 week and then return to 1 tablet once daily.    Dispense:  30 tablet    Refill:  3    Patient Instructions  Medication Instructions:   Your physician has recommended you make the following change in your medication:   Increase your Lasix (Furosemide) to 40mg , 1 tablet by mouth once a day.  *If you need a refill on your cardiac medications before your next appointment, please call your pharmacy*   Lab Work:  Please have a BMP drawn in 1 week after starting new Lasix dose and fax results to [phone]. If you have labs (blood work) drawn today and your tests are completely normal, you will receive your results only by: [name] MyChart Message (if you have MyChart) OR . A paper copy in the mail If you have any lab test that is abnormal or we need to change your treatment, we will call you to review the results.  Testing/Procedures:  Your physician has requested that you have an echocardiogram. Echocardiography is a painless test that  uses sound waves to create images of your heart. It provides your doctor with information about the size and shape of your heart and how well your heart's chambers and valves are working. This procedure takes approximately one hour. There are no restrictions for this procedure.     Follow-Up: At [facility], you and your health needs are our priority.  As part of our continuing mission to provide you with exceptional heart care, we have created designated Provider Care Teams.  These Care Teams include your primary Cardiologist (physician) and Advanced Practice Providers (APPs -  Physician Assistants and Nurse Practitioners) who all work together to provide you with the care you need, when you need it.  We recommend signing up for the patient portal called "MyChart".  Sign up information is provided on this After Visit Summary.  MyChart is used to connect with patients for Virtual Visits (Telemedicine).  Patients are able to view lab/test results, encounter notes, upcoming appointments, etc.  Non-urgent messages can be sent to your provider as well.   To learn more about what you can do with MyChart, go to [email or web address].    Your next appointment:   4 week(s) after echo  The format for your next appointment:   In Person  Provider:   Kate Sable, MD   Other Instructions N/A     Signed, Kate Sable, MD  01/21/2020 9:59 AM    [place]

## 2020-01-23 ENCOUNTER — Ambulatory Visit: Payer: Managed Care, Other (non HMO) | Admitting: Physician Assistant

## 2020-01-24 ENCOUNTER — Ambulatory Visit: Payer: Managed Care, Other (non HMO) | Admitting: Physician Assistant

## 2020-01-24 ENCOUNTER — Encounter: Payer: Self-pay | Admitting: Physician Assistant

## 2020-01-24 ENCOUNTER — Other Ambulatory Visit: Payer: Self-pay

## 2020-01-24 ENCOUNTER — Emergency Department: Payer: Managed Care, Other (non HMO)

## 2020-01-24 VITALS — BP 115/76 | HR 73 | Temp 97.2°F | Resp 16 | Wt 181.0 lb

## 2020-01-24 DIAGNOSIS — Z7901 Long term (current) use of anticoagulants: Secondary | ICD-10-CM | POA: Insufficient documentation

## 2020-01-24 DIAGNOSIS — I82431 Acute embolism and thrombosis of right popliteal vein: Secondary | ICD-10-CM | POA: Insufficient documentation

## 2020-01-24 DIAGNOSIS — R110 Nausea: Secondary | ICD-10-CM

## 2020-01-24 DIAGNOSIS — Z96649 Presence of unspecified artificial hip joint: Secondary | ICD-10-CM | POA: Insufficient documentation

## 2020-01-24 DIAGNOSIS — R2241 Localized swelling, mass and lump, right lower limb: Secondary | ICD-10-CM | POA: Diagnosis present

## 2020-01-24 DIAGNOSIS — I10 Essential (primary) hypertension: Secondary | ICD-10-CM | POA: Insufficient documentation

## 2020-01-24 DIAGNOSIS — R600 Localized edema: Secondary | ICD-10-CM

## 2020-01-24 LAB — COMPREHENSIVE METABOLIC PANEL
ALT: 59 U/L — ABNORMAL HIGH (ref 0–44)
AST: 33 U/L (ref 15–41)
Albumin: 4.1 g/dL (ref 3.5–5.0)
Alkaline Phosphatase: 173 U/L — ABNORMAL HIGH (ref 38–126)
Anion gap: 12 (ref 5–15)
BUN: 23 mg/dL (ref 8–23)
CO2: 27 mmol/L (ref 22–32)
Calcium: 9 mg/dL (ref 8.9–10.3)
Chloride: 97 mmol/L — ABNORMAL LOW (ref 98–111)
Creatinine, Ser: 1.05 mg/dL — ABNORMAL HIGH (ref 0.44–1.00)
GFR calc Af Amer: >60 mL/min (ref >60)
GFR calc non Af Amer: 56 mL/min — ABNORMAL LOW (ref >60)
Glucose, Bld: 147 mg/dL — ABNORMAL HIGH (ref 70–99)
Potassium: 3.2 mmol/L — ABNORMAL LOW (ref 3.5–5.1)
Sodium: 136 mmol/L (ref 135–145)
Total Bilirubin: 0.7 mg/dL (ref 0.3–1.2)
Total Protein: 7.7 g/dL (ref 6.5–8.1)

## 2020-01-24 LAB — CBC
HCT: 33.4 % — ABNORMAL LOW (ref 36.0–46.0)
Hemoglobin: 10.4 g/dL — ABNORMAL LOW (ref 12.0–15.0)
MCH: 25.1 pg — ABNORMAL LOW (ref 26.0–34.0)
MCHC: 31.1 g/dL (ref 30.0–36.0)
MCV: 80.7 fL (ref 80.0–100.0)
Platelets: 239 10*3/uL (ref 150–400)
RBC: 4.14 MIL/uL (ref 3.87–5.11)
RDW: 17.2 % — ABNORMAL HIGH (ref 11.5–15.5)
WBC: 12.4 10*3/uL — ABNORMAL HIGH (ref 4.0–10.5)
nRBC: 0 % (ref 0.0–0.2)

## 2020-01-24 NOTE — ED Triage Notes (Signed)
Pt to ED c/o pain and swelling to right leg. HX clot in left leg, pt got off of xarelto approx 2 weeks ago. Right legs is swollen.

## 2020-01-24 NOTE — ED Notes (Signed)
Pt returned from ultrasound

## 2020-01-24 NOTE — Progress Notes (Signed)
Established patient visit   Patient: Kristi Lee   DOB: [birth year]   64 y.o. Female  MRN: [id number] Visit Date: 01/24/2020  Today's healthcare provider: Mar Daring, PA-C   Chief Complaint  Patient presents with  . Leg cramping   Subjective    HPI Patient here with c/o right leg cramping. This started the first of the week. She reports that it made her feel like that blood clot she had before in the left leg. The pain is on the back of her calf behind the knee on the right. There is mild swelling noted. She does report being out of her xarelto x 2 weeks prior to the leg pain. Started back about 3 days ago. .   She also reports that she went to the ED Sunday (66/21) for SOB. She went [facility] and was diagnosed with acute pulmonary edema. Treatment done IV Lasix and prescribed her 1 week of low dose of lasix for home. She had labs done and CXR showed thickened bronchial markings, no focal infiltrate. CT angio chest showed a small pleural effusions and interstitial thickening, mild edema. She was referred to Cardiology and saw them on 01/21/20. She had symptom improvement so the cardiologist, Dr. Garen Lah, continued her furosemide 40mg  daily.  She is scheduled to see Cardio 03/06/20 for echocardiogram and f/u.   She reports just overall not feeling well over the last 2-3 days. Reports today she has been nauseated more.  Patient Active Problem List   Diagnosis Date Noted  . DVT (deep venous thrombosis) ([place]) 11/08/2019  . Burning sensation 10/17/2019  . Itchy scalp 10/17/2019  . Occipital neuralgia of right side 10/17/2019  . Tingling 10/17/2019  . Hyperlipidemia, mixed 10/10/2016  . Stable angina pectoris ([place]) 10/10/2016  . Adaptation reaction 01/27/2015  . Back pain, chronic 01/27/2015  . CN (constipation) 01/27/2015  . Abnormal LFTs 01/27/2015  . Benign essential HTN 01/27/2015  . Gastro-esophageal reflux disease without esophagitis 01/27/2015  . Personal  history of arthritis 01/27/2015  . Insomnia 01/27/2015  . Scoliosis 01/27/2015  . FOM (frequency of micturition) 01/27/2015   Past Medical History:  Diagnosis Date  . Depression   . GERD (gastroesophageal reflux disease)   . Hypertension        Medications: Outpatient Medications Prior to Visit  Medication Sig  . atenolol (TENORMIN) 25 MG tablet Take 1 tablet (25 mg total) by mouth daily.  . betamethasone dipropionate 0.05 % cream Apply 1 application topically as needed.  . Biotin 10000 MCG TABS Take 1 tablet by mouth 1 day or 1 dose.  . carbamazepine (TEGRETOL XR) 100 MG 12 hr tablet   . fexofenadine (ALLEGRA) 180 MG tablet Take 180 mg by mouth daily.  . furosemide (LASIX) 20 MG tablet Take 2 tablets by mouth once daily for 1 week and then return to 1 tablet once daily.  [name] gabapentin (NEURONTIN) 400 MG capsule Take 400 mg by mouth 3 (three) times daily.  [name] omeprazole (PRILOSEC) 20 MG capsule TAKE 1 CAPSULE BY MOUTH  DAILY  . tolterodine (DETROL) 2 MG tablet TAKE 1 TABLET BY MOUTH  TWICE DAILY  . traZODone (DESYREL) 100 MG tablet Take 100 mg by mouth at bedtime.  . triamterene-hydrochlorothiazide (MAXZIDE-25) 37.5-25 MG tablet TAKE 1 TABLET BY MOUTH  DAILY  . venlafaxine XR (EFFEXOR-XR) 75 MG 24 hr capsule Take 1 capsule (75 mg total) by mouth daily.  . celecoxib (CELEBREX) 200 MG capsule Take 200 mg by mouth daily. (  Patient not taking: Reported on 01/24/2020)  . gabapentin (NEURONTIN) 300 MG capsule Take 300 mg by mouth 2 (two) times daily. (Patient not taking: Reported on 01/24/2020)  . rivaroxaban (XARELTO) 20 MG TABS tablet Take 1 tablet (20 mg total) by mouth daily with supper. (Patient not taking: Reported on 01/24/2020)   No facility-administered medications prior to visit.    Review of Systems  Respiratory: Negative for chest tightness and shortness of breath.   Cardiovascular: Negative for chest pain, palpitations and leg swelling.    Last CBC Lab Results  Component  Value Date   WBC 12.4 (H) 01/24/2020   HGB 10.4 (L) 01/24/2020   HCT 33.4 (L) 01/24/2020   MCV 80.7 01/24/2020   MCH 25.1 (L) 01/24/2020   RDW 17.2 (H) 01/24/2020   PLT 239 02/54/2706   Last metabolic panel Lab Results  Component Value Date   GLUCOSE 147 (H) 01/24/2020   NA 136 01/24/2020   K 3.2 (L) 01/24/2020   CL 97 (L) 01/24/2020   CO2 27 01/24/2020   BUN 23 01/24/2020   CREATININE 1.05 (H) 01/24/2020   GFRNONAA 56 (L) 01/24/2020   GFRAA >60 01/24/2020   CALCIUM 9.0 01/24/2020   PROT 7.7 01/24/2020   ALBUMIN 4.1 01/24/2020   LABGLOB 2.5 04/26/2019   AGRATIO 1.8 04/26/2019   BILITOT 0.7 01/24/2020   ALKPHOS 173 (H) 01/24/2020   AST 33 01/24/2020   ALT 59 (H) 01/24/2020   ANIONGAP 12 01/24/2020      Objective    BP 115/76 (BP Location: Left Arm, Patient Position: Sitting, Cuff Size: Large)   Pulse 73   Temp (!) 97.2 F (36.2 C) (Temporal)   Resp 16   Wt 181 lb (82.1 kg)   BMI 29.21 kg/m  BP Readings from Last 3 Encounters:  01/27/20 136/76  01/25/20 130/80  01/24/20 115/76   Wt Readings from Last 3 Encounters:  01/27/20 178 lb 12.8 oz (81.1 kg)  01/24/20 181 lb (82.1 kg)  01/24/20 181 lb (82.1 kg)      Physical Exam Vitals reviewed.  Constitutional:      General: She is not in acute distress.    Appearance: She is well-developed. She is ill-appearing. She is not diaphoretic.  HENT:     Head: Normocephalic and atraumatic.  Eyes:     General: No scleral icterus.    Extraocular Movements: Extraocular movements intact.  Neck:     Thyroid: No thyromegaly.     Vascular: No carotid bruit or JVD.     Trachea: No tracheal deviation.  Cardiovascular:     Rate and Rhythm: Normal rate and regular rhythm.     Pulses: Normal pulses.     Heart sounds: Normal heart sounds. No murmur heard.  No friction rub. No gallop.   Pulmonary:     Effort: Pulmonary effort is normal. No respiratory distress.     Breath sounds: Normal breath sounds. No wheezing or  rales.  Musculoskeletal:     Cervical back: Normal range of motion and neck supple.     Right lower leg: Edema (1+ edema noted; right leg measures 1in larger than left currently at below knee, mid calf and ankle) present.     Left lower leg: No edema.  Lymphadenopathy:     Cervical: No cervical adenopathy.  Skin:    General: Skin is warm and dry.     Capillary Refill: Capillary refill takes less than 2 seconds.     Findings: Erythema (right lower  extremity) present.  Neurological:     Mental Status: She is alert.      No results found for any visits on 01/24/20.  Assessment & Plan     1. Edema of right lower extremity Worrisome for repeat DVT. Since it is Friday and after 7pm I will refer to [facility] ER for evaluation of DVT. Continue Xarelto. Patient in agreement with plan. No charge appt due to ER disposition.  2. Nausea Could be from anxiety about situation vs due to decreasing potassium from using furosemide for the pulmonary edema. I will f/u after hospital visit.    No follow-ups on file.      Reynolds Bowl, PA-C, have reviewed all documentation for this visit. The documentation on 01/28/20 for the exam, diagnosis, procedures, and orders are all accurate and complete.   [name]  [facility] [phone] (phone) [phone] (fax)  [place]

## 2020-01-25 ENCOUNTER — Emergency Department
Admission: EM | Admit: 2020-01-25 | Discharge: 2020-01-25 | Disposition: A | Discharge status: Home / Self Care | Payer: Managed Care, Other (non HMO) | Attending: Emergency Medicine | Admitting: Emergency Medicine

## 2020-01-25 DIAGNOSIS — I82431 Acute embolism and thrombosis of right popliteal vein: Secondary | ICD-10-CM

## 2020-01-25 DIAGNOSIS — I82409 Acute embolism and thrombosis of unspecified deep veins of unspecified lower extremity: Secondary | ICD-10-CM

## 2020-01-25 HISTORY — DX: Acute embolism and thrombosis of unspecified deep veins of unspecified lower extremity: I82.409

## 2020-01-25 NOTE — Discharge Instructions (Addendum)
Continue your xarelto and make sure not to miss any other doses. You need to see your doctor to undergo a coagulopathy workup. Make sure to schedule an appointment within the next week for that. Take tylenol for pain and elevated your leg. Do not use compression stockings for the next 2 weeks. Return to the ER immediately for worsening leg pain or swelling, chest pain, or shortness of breath.

## 2020-01-25 NOTE — ED Provider Notes (Signed)
[facility] Emergency Department Provider Note  ____________________________________________  Time seen: Approximately 3:41 AM  I have reviewed the triage vital signs and the nursing notes.   HISTORY  Chief Complaint Leg Swelling   HPI Kristi Lee is a 64 y.o. 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  Patient reports that she ran out of her Xarelto and was without it for 2 weeks.  She just recently got it refilled again 3 days ago and has restarted it.  She reports that she has noticed the swelling and the pain get progressively worse over the last couple of days.  The pain is located behind her knee on the right.  She denies chest pain or shortness of breath.  The pain is mild in intensity, constant, sharp.  Past Medical History:  Diagnosis Date  . Depression   . GERD (gastroesophageal reflux disease)   . Hypertension     Patient Active Problem List   Diagnosis Date Noted  . DVT (deep venous thrombosis) ([place]) 11/08/2019  . Burning sensation 10/17/2019  . Itchy scalp 10/17/2019  . Occipital neuralgia of right side 10/17/2019  . Tingling 10/17/2019  . Hyperlipidemia, mixed 10/10/2016  . Stable angina pectoris ([place]) 10/10/2016  . Adaptation reaction 01/27/2015  . Back pain, chronic 01/27/2015  . CN (constipation) 01/27/2015  . Abnormal LFTs 01/27/2015  . Benign essential HTN 01/27/2015  . Gastro-esophageal reflux disease without esophagitis 01/27/2015  . Personal history of arthritis 01/27/2015  . Insomnia 01/27/2015  . Scoliosis 01/27/2015  . FOM (frequency of micturition) 01/27/2015    Past Surgical History:  Procedure Laterality Date  . ABDOMINAL HYSTERECTOMY    . BACK SURGERY  10/2000  . BREAST EXCISIONAL BIOPSY Right 1990's   NEG  . FEMUR FRACTURE SURGERY Right 07/15/2009   Pertrochanteric femur fracture.  [name] HIP FRACTURE SURGERY Right   .  PERIPHERAL VASCULAR THROMBECTOMY Left 11/11/2019   Procedure: PERIPHERAL VASCULAR THROMBECTOMY;  Surgeon: Algernon Huxley, MD;  Location: [facility];  Service: Cardiovascular;  Laterality: Left;  . REVISION TOTAL HIP ARTHROPLASTY Left     Prior to Admission medications   Medication Sig Start Date End Date Taking? Authorizing Provider  atenolol (TENORMIN) 25 MG tablet Take 1 tablet (25 mg total) by mouth daily. 10/31/19   Trinna Post, PA-C  betamethasone dipropionate 0.05 % cream Apply 1 application topically as needed.    [provider]  Biotin 10000 MCG TABS Take 1 tablet by mouth 1 day or 1 dose.    [provider]  carbamazepine (TEGRETOL XR) 100 MG 12 hr tablet  11/28/19   [provider]  celecoxib (CELEBREX) 200 MG capsule Take 200 mg by mouth daily. Patient not taking: Reported on 01/24/2020    [provider]  fexofenadine (ALLEGRA) 180 MG tablet Take 180 mg by mouth daily.    [provider]  furosemide (LASIX) 20 MG tablet Take 2 tablets by mouth once daily for 1 week and then return to 1 tablet once daily. 01/21/20   Kate Sable, MD  gabapentin (NEURONTIN) 300 MG capsule Take 300 mg by mouth 2 (two) times daily. Patient not taking: Reported on 01/24/2020    [provider]  gabapentin (NEURONTIN) 400 MG capsule Take 400 mg by mouth 3 (three) times daily. 12/31/19   [provider]  omeprazole (PRILOSEC) 20 MG capsule TAKE 1 CAPSULE BY MOUTH  DAILY 09/20/19   Mar Daring, PA-C  rivaroxaban (XARELTO) 20 MG TABS tablet Take 1 tablet (20 mg total) by mouth daily with supper. Patient not taking: Reported on 01/24/2020 11/18/19   Sindy Guadeloupe, MD  tolterodine (DETROL) 2 MG tablet TAKE 1 TABLET BY MOUTH  TWICE DAILY 04/15/19   Mar Daring, PA-C  traZODone (DESYREL) 100 MG tablet Take 100 mg by mouth at bedtime.    [provider]  triamterene-hydrochlorothiazide (MAXZIDE-25) 37.5-25 MG tablet  TAKE 1 TABLET BY MOUTH  DAILY 09/20/19   Mar Daring, PA-C  venlafaxine XR (EFFEXOR-XR) 75 MG 24 hr capsule Take 1 capsule (75 mg total) by mouth daily. 10/31/19   Trinna Post, PA-C    Allergies Patient has no known allergies.  Family History  Problem Relation Age of Onset  . Osteoarthritis Mother   . Lung cancer Father   . Diabetes Brother   . Breast cancer Neg Hx     Social History Social History   Tobacco Use  . Smoking status: Never Smoker  . Smokeless tobacco: Never Used  Vaping Use  . Vaping Use: Never used  Substance Use Topics  . Alcohol use: No  . Drug use: No    Review of Systems  Constitutional: Negative for fever. Eyes: Negative for visual changes. ENT: Negative for sore throat. Neck: No neck pain  Cardiovascular: Negative for chest pain. Respiratory: Negative for shortness of breath. Gastrointestinal: Negative for abdominal pain, vomiting or diarrhea. Genitourinary: Negative for dysuria. Musculoskeletal: Negative for back pain. + RLE pain andswelling Skin: Negative for rash. Neurological: Negative for headaches, weakness or numbness. Psych: No SI or HI  ____________________________________________   PHYSICAL EXAM:  VITAL SIGNS: ED Triage Vitals [01/24/20 2017]  Enc Vitals Group     BP 126/61     Pulse Rate 71     Resp 18     Temp 99 F (37.2 C)     Temp Source Oral     SpO2 98 %     Weight 181 lb (82.1 kg)     Height 5\' 6"  (1.676 m)     Head Circumference      Peak Flow      Pain Score 6     Pain Loc      Pain Edu?      Excl. in [place]?     Constitutional: Alert and oriented. Well appearing and in no apparent distress. HEENT:      Head: Normocephalic and atraumatic.         Eyes: Conjunctivae are normal. Sclera is non-icteric.       Mouth/Throat: Mucous membranes are moist.       Neck: Supple with no signs of meningismus. Cardiovascular: Regular rate and rhythm. No murmurs, gallops, or rubs.  Respiratory: Normal  respiratory effort. Lungs are clear to auscultation bilaterally.  Gastrointestinal: Soft, non tender Musculoskeletal: Asymmetric swelling of the right lower extremity with 1+ pitting edema from the knee down.  neurologic: Normal speech and language. Face is symmetric. Moving all extremities. No gross focal neurologic deficits are appreciated. Skin: Skin is warm, dry and intact. No rash noted. Psychiatric: Mood and affect are normal. Speech and behavior are normal.  ____________________________________________   LABS (all labs ordered are listed, but only abnormal results are displayed)  Labs Reviewed  COMPREHENSIVE METABOLIC PANEL - Abnormal; Notable for the following components:      Result Value   Potassium 3.2 (*)    Chloride 97 (*)  Glucose, Bld 147 (*)    Creatinine, Ser 1.05 (*)    ALT 59 (*)    Alkaline Phosphatase 173 (*)    GFR calc non Af Amer 56 (*)    All other components within normal limits  CBC - Abnormal; Notable for the following components:   WBC 12.4 (*)    Hemoglobin 10.4 (*)    HCT 33.4 (*)    MCH 25.1 (*)    RDW 17.2 (*)    All other components within normal limits   ____________________________________________  EKG  none  ____________________________________________  RADIOLOGY  I have personally reviewed the images performed during this visit and I agree with the Radiologist's read.   Interpretation by Radiologist:  US Venous Img Lower Unilateral Right  Result Date: 01/24/2020 CLINICAL DATA:  Pain and swelling EXAM: RIGHT LOWER EXTREMITY VENOUS DOPPLER ULTRASOUND TECHNIQUE: Gray-scale sonography with compression, as well as color and duplex ultrasound, were performed to evaluate the deep venous system(s) from the level of the common femoral vein through the popliteal and proximal calf veins. COMPARISON:  None. FINDINGS: VENOUS There is no DVT identified in the right common femoral vein, right profunda femoral vein, right femoral vein, or tibial  veins. There is a nonocclusive thrombus within the right popliteal vein. Limited views of the contralateral common femoral vein are unremarkable. OTHER None. Limitations: none IMPRESSION: Study is positive for a small, nonocclusive DVT within the right popliteal vein. Electronically Signed   By: Constance Holster M.D.   On: 01/24/2020 23:05     ____________________________________________   PROCEDURES  Procedure(s) performed: None Procedures Critical Care performed:  None ____________________________________________   INITIAL IMPRESSION / ASSESSMENT AND PLAN / ED COURSE   64 y.o. 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  Patient has restarted it a few days ago.  She has asymmetric swelling of the right lower extremity with 1+ pitting edema.  Ultrasound reviewed by me showing a nonocclusive right popliteal DVT which is confirmed by radiology.  Patient has no signs or symptoms of PE with no chest pain, no shortness of breath, no tachypnea, no tachycardia, no hypoxia.  Patient recently underwent a CT angiogram 6 days ago for shortness of breath and was found to be due to CHF with no PE.  Her symptoms have fully resolved.  Patient has not undergone hypercoagulable evaluation.  Recommended close follow-up with PCP for that.  In the meantime discussed the importance of not missing any doses of Xarelto.  Discussed my return precautions for any signs of PE.  Discussed pain control and elevation, avoidance of compression stockings for the next 2 weeks.  Old medical records reviewed.      _____________________________________________ Please note:  Patient was evaluated in Emergency Department today for the symptoms described in the history of present illness. Patient was evaluated in the context of the global COVID-19 pandemic, which necessitated consideration that  the patient might be at risk for infection with the SARS-CoV-2 virus that causes COVID-19. Institutional protocols and algorithms that pertain to the evaluation of patients at risk for COVID-19 are in a state of rapid change based on information released by regulatory bodies including the CDC and federal and state organizations. These policies and algorithms were followed during the patient's care in the ED.  Some ED evaluations and interventions may be delayed as a result of limited  staffing during the pandemic.   [place] Controlled Substance Database was reviewed by me. ____________________________________________   FINAL CLINICAL IMPRESSION(S) / ED DIAGNOSES   Final diagnoses:  Acute deep vein thrombosis (DVT) of popliteal vein of right lower extremity (HCC)      NEW MEDICATIONS STARTED DURING THIS VISIT:  ED Discharge Orders    None       Note:  This document was prepared using Dragon voice recognition software and may include unintentional dictation errors.    Alfred Levins, [place], MD 01/25/20 [phone]

## 2020-01-27 ENCOUNTER — Telehealth: Payer: Self-pay | Admitting: *Deleted

## 2020-01-27 ENCOUNTER — Other Ambulatory Visit: Payer: Self-pay | Admitting: Physician Assistant

## 2020-01-27 ENCOUNTER — Other Ambulatory Visit: Payer: Self-pay

## 2020-01-27 ENCOUNTER — Encounter: Payer: Self-pay | Admitting: Physician Assistant

## 2020-01-27 ENCOUNTER — Ambulatory Visit: Payer: Managed Care, Other (non HMO) | Admitting: Physician Assistant

## 2020-01-27 VITALS — BP 136/76 | HR 85 | Temp 96.6°F | Wt 178.8 lb

## 2020-01-27 DIAGNOSIS — I82492 Acute embolism and thrombosis of other specified deep vein of left lower extremity: Secondary | ICD-10-CM

## 2020-01-27 DIAGNOSIS — R710 Precipitous drop in hematocrit: Secondary | ICD-10-CM

## 2020-01-27 DIAGNOSIS — I82431 Acute embolism and thrombosis of right popliteal vein: Secondary | ICD-10-CM

## 2020-01-27 DIAGNOSIS — H6981 Other specified disorders of Eustachian tube, right ear: Secondary | ICD-10-CM | POA: Diagnosis not present

## 2020-01-27 DIAGNOSIS — L299 Pruritus, unspecified: Secondary | ICD-10-CM

## 2020-01-27 DIAGNOSIS — E876 Hypokalemia: Secondary | ICD-10-CM | POA: Diagnosis not present

## 2020-01-27 MED ORDER — FLUTICASONE PROPIONATE 50 MCG/ACT NA SUSP: 2.0000 | Freq: Every day | NASAL | 6 refills | Status: DC

## 2020-01-27 MED ORDER — DOXEPIN HCL 10 MG PO CAPS: 10.0000 mg | ORAL_CAPSULE | Freq: Two times a day (BID) | ORAL | 0 refills | Status: DC

## 2020-01-27 MED ORDER — POTASSIUM CHLORIDE CRYS ER 20 MEQ PO TBCR: 20.0000 meq | EXTENDED_RELEASE_TABLET | Freq: Every day | ORAL | 3 refills | Status: DC

## 2020-01-27 NOTE — Patient Instructions (Signed)

## 2020-01-27 NOTE — Telephone Encounter (Signed)
Please add her to see me in person next week. No labs

## 2020-01-27 NOTE — Progress Notes (Signed)
Established patient visit   Patient: Kristi Lee   DOB: 1956-07-14   64 y.o. Female  MRN: [id number] Visit Date: 01/27/2020  Today's healthcare provider: Mar Daring, PA-C   Chief Complaint  Patient presents with   Follow-up  I,Abrahim Sargent Cline Crock as a scribe for Mar Daring, PA-C.,have documented all relevant documentation on the behalf of Mar Daring, PA-C,as directed by  Mar Daring, PA-C while in the presence of Mar Daring, [place].  Subjective    HPI Follow up ER visit  Patient was seen in ER for leg swelling on 01/24/2020 in office and sent to ER. She was treated for DVT of popliteal vein of right lower extremity. Treatment for this included labs, US venous of lower unilateral right. She reports satisfactory compliance with treatment. She reports this condition is the same per pt. Swelling is improving. She is back on Xarelto and is aware to stay on medication. She has been seen by Hematology and vascular.   -----------------------------------------------------------------------------------------   Patient Active Problem List   Diagnosis Date Noted   DVT (deep venous thrombosis) ([place]) 11/08/2019   Burning sensation 10/17/2019   Itchy scalp 10/17/2019   Occipital neuralgia of right side 10/17/2019   Tingling 10/17/2019   Hyperlipidemia, mixed 10/10/2016   Stable angina pectoris ([place]) 10/10/2016   Adaptation reaction 01/27/2015   Back pain, chronic 01/27/2015   CN (constipation) 01/27/2015   Abnormal LFTs 01/27/2015   Benign essential HTN 01/27/2015   Gastro-esophageal reflux disease without esophagitis 01/27/2015   Personal history of arthritis 01/27/2015   Insomnia 01/27/2015   Scoliosis 01/27/2015   FOM (frequency of micturition) 01/27/2015   Past Medical History:  Diagnosis Date   Depression    GERD (gastroesophageal reflux disease)    Hypertension         Medications: Outpatient Medications Prior to Visit  Medication Sig   atenolol (TENORMIN) 25 MG tablet Take 1 tablet (25 mg total) by mouth daily.   betamethasone dipropionate 0.05 % cream Apply 1 application topically as needed.   Biotin 10000 MCG TABS Take 1 tablet by mouth 1 day or 1 dose.   carbamazepine (TEGRETOL XR) 100 MG 12 hr tablet    fexofenadine (ALLEGRA) 180 MG tablet Take 180 mg by mouth daily.   furosemide (LASIX) 20 MG tablet Take 2 tablets by mouth once daily for 1 week and then return to 1 tablet once daily.   gabapentin (NEURONTIN) 400 MG capsule Take 400 mg by mouth 3 (three) times daily.   omeprazole (PRILOSEC) 20 MG capsule TAKE 1 CAPSULE BY MOUTH  DAILY   rivaroxaban (XARELTO) 20 MG TABS tablet Take 1 tablet (20 mg total) by mouth daily with supper.   tolterodine (DETROL) 2 MG tablet TAKE 1 TABLET BY MOUTH  TWICE DAILY   traZODone (DESYREL) 100 MG tablet Take 100 mg by mouth at bedtime.   triamterene-hydrochlorothiazide (MAXZIDE-25) 37.5-25 MG tablet TAKE 1 TABLET BY MOUTH  DAILY   venlafaxine XR (EFFEXOR-XR) 75 MG 24 hr capsule Take 1 capsule (75 mg total) by mouth daily.   gabapentin (NEURONTIN) 300 MG capsule Take 300 mg by mouth 2 (two) times daily. (Patient not taking: Reported on 01/24/2020)   [DISCONTINUED] celecoxib (CELEBREX) 200 MG capsule Take 200 mg by mouth daily. (Patient not taking: Reported on 01/24/2020)   No facility-administered medications prior to visit.    Review of Systems  Last CBC Lab Results  Component Value Date   WBC 12.4 (  H) 01/24/2020   HGB 10.4 (L) 01/24/2020   HCT 33.4 (L) 01/24/2020   MCV 80.7 01/24/2020   MCH 25.1 (L) 01/24/2020   RDW 17.2 (H) 01/24/2020   PLT 239 28/78/6767   Last metabolic panel Lab Results  Component Value Date   GLUCOSE 147 (H) 01/24/2020   NA 136 01/24/2020   K 3.2 (L) 01/24/2020   CL 97 (L) 01/24/2020   CO2 27 01/24/2020   BUN 23 01/24/2020   CREATININE 1.05 (H) 01/24/2020    GFRNONAA 56 (L) 01/24/2020   GFRAA >60 01/24/2020   CALCIUM 9.0 01/24/2020   PROT 7.7 01/24/2020   ALBUMIN 4.1 01/24/2020   LABGLOB 2.5 04/26/2019   AGRATIO 1.8 04/26/2019   BILITOT 0.7 01/24/2020   ALKPHOS 173 (H) 01/24/2020   AST 33 01/24/2020   ALT 59 (H) 01/24/2020   ANIONGAP 12 01/24/2020      Objective    BP 136/76 (BP Location: Left Arm, Patient Position: Sitting, Cuff Size: Normal)    Pulse 85    Temp (!) 96.6 F (35.9 C) (Temporal)    Wt 178 lb 12.8 oz (81.1 kg)    SpO2 95%    BMI 28.86 kg/m  BP Readings from Last 3 Encounters:  01/27/20 136/76  01/25/20 130/80  01/24/20 115/76   Wt Readings from Last 3 Encounters:  01/27/20 178 lb 12.8 oz (81.1 kg)  01/24/20 181 lb (82.1 kg)  01/24/20 181 lb (82.1 kg)      Physical Exam Constitutional:      Appearance: Normal appearance.  Skin:    General: Skin is warm and dry.  Neurological:     General: No focal deficit present.     Mental Status: She is alert and oriented to person, place, and time.  Psychiatric:        Mood and Affect: Mood normal.        Behavior: Behavior normal.      Results for orders placed or performed in visit on 01/27/20  Iron, TIBC and Ferritin Panel  Result Value Ref Range   Total Iron Binding Capacity 372 250 - 450 ug/dL   UIBC 324 118 - 369 ug/dL   Iron 48 27 - 139 ug/dL   Iron Saturation 13 (L) 15 - 55 %   Ferritin 75 15.0 - 150.0 ng/mL  PROTEIN S PANEL  Result Value Ref Range   Protein S Ag, Total 136 60 - 150 %   Protein S Ag, Free 121 57 - 157 %   Protein S Activity 72 63 - 140 %  Factor 5 leiden  Result Value Ref Range   Factor V Leiden WILL FOLLOW   Antithrombin III  Result Value Ref Range   AntiThromb III Func 133 75 - 135 %  Protein C, total  Result Value Ref Range   Protein C Antigen 110 60 - 150 %    Assessment & Plan     1. Hypokalemia Potassium borderline low. On furosemide 40mg . Will add potasium as below. F/U in 4 weeks for labs.  - potassium chloride SA  (KLOR-CON) 20 MEQ tablet; Take 1 tablet (20 mEq total) by mouth daily.  Dispense: 30 tablet; Refill: 3  2. Decreased hemoglobin Noted on labs but had improved to 10.4 from 9.5 on Sunday (01/19/20). Will check iron levels as below. Since she is back on xarelto may need to check CBC in 2-4 weeks.  - Fe+TIBC+Fer  3. Itching Worsening due to swelling and known psoriasis. Will  try doxepin as below for itching. - doxepin (SINEQUAN) 10 MG capsule; Take 1 capsule (10 mg total) by mouth 2 (two) times daily.  Dispense: 60 capsule; Refill: 0  4. Dysfunction of right eustachian tube Mild fullness sensation in the right ear. Advised to start flonase as below. Call if not improving.  - fluticasone (FLONASE) 50 MCG/ACT nasal spray; Place 2 sprays into both nostrils daily.  Dispense: 16 g; Refill: 6  5. Acute deep vein thrombosis (DVT) of popliteal vein of right lower extremity (HCC) Had been off xarelto x 2 weeks and recently started back just 3 days ago. Will check hypercoag panel as below. Continue xarelto. F/U with hematology.  - Antithrombin III - PROTEIN S PANEL - Factor 5 leiden - Protein C, total  6. Acute deep vein thrombosis (DVT) of other specified vein of left lower extremity (HCC) Initial clot was extensive clot in the left lower leg. Removed via thrombectomy with vascular surgery and started on xarelto. See above medical treatment plan. - Antithrombin III - PROTEIN S PANEL - Factor 5 leiden - Protein C, total   Return if symptoms worsen or fail to improve.      Reynolds Bowl, PA-C, have reviewed all documentation for this visit. The documentation on 01/28/20 for the exam, diagnosis, procedures, and orders are all accurate and complete.   [name]  [facility] [phone] (phone) [phone] (fax)  [place]

## 2020-01-27 NOTE — Telephone Encounter (Signed)
Patient called reporting that she went to ER Friday night and that she has a DVT in her right leg. Her follow up appointment with physician is not until October and her PCP suggested that she call us to see if Dr [name] wants to see her before October with this new finding. Please advise  IMPRESSION: Study is positive for a small, nonocclusive DVT within the right popliteal vein.   Electronically Signed   By: Constance Holster M.D.   On: 01/24/2020 23:05  INITIAL IMPRESSION / ASSESSMENT AND PLAN / ED COURSE   64 y.o. 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  Patient has restarted it a few days ago.  She has asymmetric swelling of the right lower extremity with 1+ pitting edema.  Ultrasound reviewed by me showing a nonocclusive right popliteal DVT which is confirmed by radiology.  Patient has no signs or symptoms of PE with no chest pain, no shortness of breath, no tachypnea, no tachycardia, no hypoxia.  Patient recently underwent a CT angiogram 6 days ago for shortness of breath and was found to be due to CHF with no PE.  Her symptoms have fully resolved.  Patient has not undergone hypercoagulable evaluation.  Recommended close follow-up with PCP for that.  In the meantime discussed the importance of not missing any doses of Xarelto.  Discussed my return precautions for any signs of PE.  Discussed pain control and elevation, avoidance of compression stockings for the next 2 weeks.  Old medical records reviewed.

## 2020-01-28 ENCOUNTER — Ambulatory Visit: Payer: Self-pay | Admitting: Physician Assistant

## 2020-01-28 ENCOUNTER — Telehealth: Payer: Self-pay

## 2020-01-28 ENCOUNTER — Telehealth: Payer: Self-pay | Admitting: Physician Assistant

## 2020-01-28 ENCOUNTER — Encounter: Payer: Self-pay | Admitting: Physician Assistant

## 2020-01-28 ENCOUNTER — Ambulatory Visit: Payer: Self-pay | Admitting: *Deleted

## 2020-01-28 DIAGNOSIS — L299 Pruritus, unspecified: Secondary | ICD-10-CM

## 2020-01-28 NOTE — Telephone Encounter (Signed)
Patient advised as directed below. 

## 2020-01-28 NOTE — Telephone Encounter (Signed)
-----   Message from Mar Daring, PA-C sent at 01/28/2020  9:45 AM EDT ----- Iron levels are ok. Other labs are pending.

## 2020-01-28 NOTE — Telephone Encounter (Signed)
Attempted to contact patient. Left VM to return call to discuss symptoms.

## 2020-01-28 NOTE — Telephone Encounter (Signed)
Message from [place] sent at 01/28/2020 4:11 PM EDT  Summary: medication side effect (?), dizziness, lethargy    Patient states that the medication doxepin (SINEQUAN) 10 MG capsule has been making her feel sleepy and slightly dizzy. She is requesting call back from RN to discuss further.         LMTCB to discuss symptoms.

## 2020-01-28 NOTE — Telephone Encounter (Signed)
Pt returned call, see triage summary.

## 2020-01-28 NOTE — Telephone Encounter (Signed)
Pt saw J. Burnette PA-C 01/27/2020, Worsened itching, h/o psoriasis. Pt states took first dose last night , "Very sleepy all day, fatigued." States did not take 2nd dose today. Reports bought OTC cream which helped "A little, not much." States Doxepin ineffective "Didn't help at all." Will not take tonight's dose.. Assured pt NT would route to practice for PCP's review.  After hours call.  CB# [phone]  Reason for Disposition  Caller wants to use a complementary or alternative medicine  Answer Assessment - Initial Assessment Questions 1.   NAME of MEDICATION: "What medicine are you calling about?"     Doxepin 10 mg 2.   QUESTION: "What is your question?"     Fatigued, "Sleepy" 3.   PRESCRIBING HCP: "Who prescribed it?" Reason: if prescribed by specialist, call should be referred to that group.    Joette Catching, PA-C 4. SYMPTOMS: "Do you have any symptoms?"     "Very sleepy" 5. SEVERITY: If symptoms are present, ask "Are they mild, moderate or severe?"     Moderate  Protocols used: MEDICATION QUESTION CALL-A-AH

## 2020-01-29 MED ORDER — HYDROXYZINE HCL 10 MG PO TABS: 10.0000 mg | ORAL_TABLET | Freq: Three times a day (TID) | ORAL | 0 refills | Status: DC | PRN

## 2020-01-29 NOTE — Telephone Encounter (Signed)
Please advise 

## 2020-01-29 NOTE — Telephone Encounter (Signed)
Stop Doxepin. Can try Hydroxyzine. Will send to [facility]. Can also cause drowsiness. Try at bedtime first.

## 2020-01-29 NOTE — Telephone Encounter (Signed)
Pt advised.   Thanks,   -Leshonda Galambos  

## 2020-01-29 NOTE — Addendum Note (Signed)
Addended by: Mar Daring on: 01/29/2020 10:16 AM   Modules accepted: Orders

## 2020-01-31 ENCOUNTER — Ambulatory Visit: Payer: Self-pay

## 2020-01-31 NOTE — Telephone Encounter (Signed)
Ret'd call to pt.  Stated she has had onset of intermittent dizziness over past 3 days.  Reported "it is hard to describe; it feels like something isn't quite right."  Reported the dizziness occurs when walking around.  Uses a walker.  Denied feeling faint, chest pain, shortness of breath, heart palpitations, headache, vision change, speech difficulty, or weakness of extremities.  Reported she has noticed Coke tastes different than usual.  Denied loss of taste or smell.  Denied any change in taste with other food or beverages.  Reported she drinks water throughout the day.  Appt. Sched. 02/03/20 with PA.  Pt. Given care advice per protocol.  Verb. Understanding.      Reason for Disposition  [1] MILD dizziness (e.g., walking normally) AND [2] has NOT been evaluated by physician for this  (Exception: dizziness caused by heat exposure, sudden standing, or poor fluid intake)  Answer Assessment - Initial Assessment Questions 1. DESCRIPTION: "Describe your dizziness."     "Feels that something isn't quite right"  2. LIGHTHEADED: "Do you feel lightheaded?" (e.g., somewhat faint, woozy, weak upon standing)     Denied feeling faint 3. VERTIGO: "Do you feel like either you or the room is spinning or tilting?" (i.e. vertigo)     Denied that room is spinning  4. SEVERITY: "How bad is it?"  "Do you feel like you are going to faint?" "Can you stand and walk?"   - MILD - walking normally   - MODERATE - interferes with normal activities (e.g., work, school)    - SEVERE - unable to stand, requires support to walk, feels like passing out now.      mild 5. ONSET:  "When did the dizziness begin?"     3 days ago  6. AGGRAVATING FACTORS: "Does anything make it worse?" (e.g., standing, change in head position)    Denied  7. HEART RATE: "Can you tell me your heart rate?" "How many beats in 15 seconds?"  (Note: not all patients can do this)       Denied any sensation of heart racing or beating differently  8. CAUSE:  "What do you think is causing the dizziness?"     unknown 9. RECURRENT SYMPTOM: "Have you had dizziness before?" If so, ask: "When was the last time?" "What happened that time?"     About 2 weeks ago had episode of dizziness 10. OTHER SYMPTOMS: "Do you have any other symptoms?" (e.g., fever, chest pain, vomiting, diarrhea, bleeding)       Stated her taste of Coke has changed. Denied nausea/ vomiting, headache, vision change, speech difficulty, unilateral weakness, or numbness or tingling of extremities.  Occas. Feels off balance  11. PREGNANCY: "Is there any chance you are pregnant?" "When was your last menstrual period?"       n/a  Protocols used: DIZZINESS [facility]  Message from Sheran Luz sent at 01/31/2020 8:41 AM EDT  Summary: slight dizziness    Patient requesting to speak to nurse regarding recent dizziness. She states it comes and goes and has not lost her balance. She would like to discuss if this is caused by medications or if it could be something that needs to be evaluated.

## 2020-02-03 ENCOUNTER — Other Ambulatory Visit: Payer: Self-pay | Admitting: Physician Assistant

## 2020-02-03 ENCOUNTER — Ambulatory Visit: Payer: Managed Care, Other (non HMO) | Admitting: Family Medicine

## 2020-02-03 ENCOUNTER — Other Ambulatory Visit: Payer: Self-pay

## 2020-02-03 ENCOUNTER — Other Ambulatory Visit: Payer: Self-pay | Admitting: Cardiology

## 2020-02-03 ENCOUNTER — Telehealth: Payer: Self-pay | Admitting: Cardiology

## 2020-02-03 DIAGNOSIS — I10 Essential (primary) hypertension: Secondary | ICD-10-CM

## 2020-02-03 LAB — PROTEIN S PANEL
Protein S Activity: 72 % (ref 63–140)
Protein S Ag, Free: 121 % (ref 57–157)
Protein S Ag, Total: 136 % (ref 60–150)

## 2020-02-03 LAB — IRON,TIBC AND FERRITIN PANEL
Ferritin: 75 ng/mL (ref 15–150)
Iron Saturation: 13 % — ABNORMAL LOW (ref 15–55)
Iron: 48 ug/dL (ref 27–139)
Total Iron Binding Capacity: 372 ug/dL (ref 250–450)
UIBC: 324 ug/dL (ref 118–369)

## 2020-02-03 LAB — ANTITHROMBIN III: AntiThromb III Func: 133 % (ref 75–135)

## 2020-02-03 LAB — PROTEIN C, TOTAL: Protein C Antigen: 110 % (ref 60–150)

## 2020-02-03 LAB — FACTOR 5 LEIDEN

## 2020-02-03 MED ORDER — FUROSEMIDE 40 MG PO TABS
40.0000 mg | ORAL_TABLET | Freq: Every day | ORAL | 0 refills | Status: DC
Start: 2020-02-03 — End: 2020-03-04

## 2020-02-03 NOTE — Telephone Encounter (Signed)
Call to patient to review medication list per last OV.   Pt verbalized understanding to take both as directed by MD.   No further questions at this time.   Advised pt to call for any further questions or concerns.

## 2020-02-03 NOTE — Telephone Encounter (Signed)
Pt c/o medication issue:  1. Name of Medication: triam /hctz  37.5/ 25 mg po q d and furosemide 40 mg po q d   2. How are you currently taking this medication (dosage and times per day)? above  3. Are you having a reaction (difficulty breathing--STAT)? no  4. What is your medication issue? Patient confirming need to take one or both of these fluid pills

## 2020-02-04 ENCOUNTER — Other Ambulatory Visit: Payer: Self-pay

## 2020-02-04 DIAGNOSIS — R0609 Other forms of dyspnea: Secondary | ICD-10-CM

## 2020-02-04 DIAGNOSIS — I824Y2 Acute embolism and thrombosis of unspecified deep veins of left proximal lower extremity: Secondary | ICD-10-CM

## 2020-02-04 DIAGNOSIS — I10 Essential (primary) hypertension: Secondary | ICD-10-CM

## 2020-02-04 DIAGNOSIS — R0600 Dyspnea, unspecified: Secondary | ICD-10-CM

## 2020-02-04 DIAGNOSIS — R600 Localized edema: Secondary | ICD-10-CM

## 2020-02-04 LAB — BASIC METABOLIC PANEL
BUN/Creatinine Ratio: 23 (ref 12–28)
BUN: 22 mg/dL (ref 8–27)
CO2: 26 mmol/L (ref 20–29)
Calcium: 8.8 mg/dL (ref 8.7–10.3)
Chloride: 103 mmol/L (ref 96–106)
Creatinine, Ser: 0.94 mg/dL (ref 0.57–1.00)
GFR calc Af Amer: 74 mL/min/{1.73_m2} (ref >59)
GFR calc non Af Amer: 64 mL/min/{1.73_m2} (ref >59)
Glucose: 100 mg/dL — ABNORMAL HIGH (ref 65–99)
Potassium: 4.1 mmol/L (ref 3.5–5.2)
Sodium: 142 mmol/L (ref 134–144)

## 2020-02-07 ENCOUNTER — Inpatient Hospital Stay: Payer: Managed Care, Other (non HMO) | Attending: Oncology | Admitting: Oncology

## 2020-02-07 ENCOUNTER — Other Ambulatory Visit: Payer: Self-pay

## 2020-02-07 ENCOUNTER — Telehealth: Payer: Self-pay | Admitting: Pharmacy Technician

## 2020-02-07 ENCOUNTER — Telehealth: Payer: Self-pay

## 2020-02-07 VITALS — BP 121/72 | HR 84 | Temp 96.4°F | Resp 16 | Wt 184.0 lb

## 2020-02-07 DIAGNOSIS — Z1211 Encounter for screening for malignant neoplasm of colon: Secondary | ICD-10-CM

## 2020-02-07 DIAGNOSIS — Z800 Family history of malignant neoplasm of digestive organs: Secondary | ICD-10-CM

## 2020-02-07 DIAGNOSIS — Z86718 Personal history of other venous thrombosis and embolism: Secondary | ICD-10-CM | POA: Diagnosis not present

## 2020-02-07 DIAGNOSIS — Z9114 Patient's other noncompliance with medication regimen: Secondary | ICD-10-CM | POA: Diagnosis not present

## 2020-02-07 DIAGNOSIS — Z7901 Long term (current) use of anticoagulants: Secondary | ICD-10-CM | POA: Insufficient documentation

## 2020-02-07 DIAGNOSIS — Z1239 Encounter for other screening for malignant neoplasm of breast: Secondary | ICD-10-CM

## 2020-02-07 DIAGNOSIS — I82412 Acute embolism and thrombosis of left femoral vein: Secondary | ICD-10-CM | POA: Insufficient documentation

## 2020-02-07 DIAGNOSIS — I82433 Acute embolism and thrombosis of popliteal vein, bilateral: Secondary | ICD-10-CM | POA: Insufficient documentation

## 2020-02-07 DIAGNOSIS — I82403 Acute embolism and thrombosis of unspecified deep veins of lower extremity, bilateral: Secondary | ICD-10-CM

## 2020-02-07 NOTE — Telephone Encounter (Signed)
Pt's last colonoscopy was 04/2021.  She states her mom had colon cancer three times and would like a referral for a colonoscopy.  Thanks,   -Mickel Baas

## 2020-02-07 NOTE — Telephone Encounter (Signed)
Copied from [place] [id number]. Topic: General - Other >> Feb 07, 2020 10:42 AM Celene Kras wrote: Reason for CRM: Pt called and is requesting to have a call back to let her know when she is due for her next colonoscopy and mammogram. Please advise.

## 2020-02-07 NOTE — Progress Notes (Addendum)
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 After about 10 days of receiving Covid vaccine patient noted to have increasing left lower extremity pain and swelling. Ultrasound of the left lower extremity showed extensive left lower extremity DVT involving left common femoral as well as popliteal and calf veins. No preceding history of trauma or surgery. Patient works from home but is fairly active. She is a lifelong non-smoker. No history of pregnancy losses.  Reports that she may have had a blood clot in her leg back in her 20s but does not remember taking any blood thinners for the same.  Also does not remember if she was on birth control at that time.Family history significant for DVT in her mother. Patient is currently on Xarelto and tolerating it well. She still has some ongoing left lower extremity pain but reports that it is slowly improving.Her appetite and weight have remained stable. Denies any other new aches and pains anywhere. She is up-to-date with her colonoscopy and mammograms.  Results of hypercoagulable work-up did not show any evidence of prothrombin gene mutation or evidence of antiphospholipid antibody syndrome.  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  Protein C, protein S and Antithrombin III levels have not been done due to  recent acute DVT  Interval history-patient presented to the ER with symptoms of right lower extremity pain and swelling and was noted to have nonocclusive right popliteal vein DVT.  Patient states she ran out of Xarelto and was noncompliant for the last 2 weeks preceding her episode of DVT.  ECOG PS- 1 Pain scale- 0   Review of systems- Review of Systems  Constitutional: Negative for chills, fever, malaise/fatigue and weight loss.  HENT: Negative for congestion, ear discharge and nosebleeds.   Eyes: Negative for blurred vision.  Respiratory: Negative for cough, hemoptysis, sputum production, shortness of breath and wheezing.   Cardiovascular: Negative for chest pain, palpitations, orthopnea and claudication.  Gastrointestinal: Negative for abdominal pain, blood in stool, constipation, diarrhea, heartburn, melena, nausea and vomiting.  Genitourinary: Negative for dysuria, flank pain, frequency, hematuria and urgency.  Musculoskeletal: Negative for back pain, joint pain and myalgias.  Skin: Negative for rash.  Neurological: Negative for dizziness, tingling, focal weakness, seizures, weakness and headaches.  Endo/Heme/Allergies: Does not bruise/bleed easily.  Psychiatric/Behavioral: Negative for depression and suicidal ideas. The patient does not have insomnia.       No Known Allergies   Past Medical History:  Diagnosis Date  . Depression   . GERD (gastroesophageal reflux disease)   . Hypertension      Past Surgical History:  Procedure Laterality Date  . ABDOMINAL HYSTERECTOMY    . BACK SURGERY  10/2000  . BREAST EXCISIONAL BIOPSY Right 1990's   NEG  . FEMUR FRACTURE SURGERY Right 07/15/2009   Pertrochanteric femur fracture.  [name] HIP FRACTURE SURGERY Right   . PERIPHERAL VASCULAR THROMBECTOMY Left 11/11/2019   Procedure: PERIPHERAL VASCULAR THROMBECTOMY;  Surgeon: Algernon Huxley, MD;  Location: [facility];  Service: Cardiovascular;  Laterality: Left;  . REVISION TOTAL  HIP ARTHROPLASTY Left     Social History   Socioeconomic History  . Marital status: Married    Spouse name: Not on file  . Number of children: 1  . Years of education: HS  . Highest education level: Not on file  Occupational History  . Occupation: Industrial/product designer: [employer]  Tobacco Use  . Smoking status: Never Smoker  . Smokeless tobacco: Never Used  Vaping Use  . Vaping Use: Never used  Substance and Sexual Activity  . Alcohol use: No  . Drug use: No  . Sexual activity: Not on file  Other Topics Concern  . Not on file  Social History Narrative   Lives at home in private residence with husband   Social Determinants of Health   Financial Resource Strain:   . Difficulty of Paying Living Expenses:   Food Insecurity:   . Worried About Charity fundraiser in the Last Year:   . Arboriculturist in the Last Year:   Transportation Needs:   . Film/video editor (Medical):   [name] Lack of Transportation (Non-Medical):   Physical Activity:   . Days of Exercise per Week:   . Minutes of Exercise per Session:   Stress:   . Feeling of Stress :   Social Connections:   . Frequency of Communication with Friends and Family:   . Frequency of Social Gatherings with Friends and Family:   . Attends Religious Services:   . Active Member of Clubs or Organizations:   . Attends Archivist Meetings:   [name] Marital Status:   Intimate Partner Violence:   . Fear of Current or Ex-Partner:   . Emotionally Abused:   [name] Physically Abused:   . Sexually Abused:     Family History  Problem Relation Age of Onset  . Osteoarthritis Mother   . Lung cancer Father   . Diabetes Brother   . Breast cancer Neg Hx      Current Outpatient Medications:  .  atenolol (TENORMIN) 25 MG tablet, Take 1 tablet (25 mg total) by mouth daily., Disp: 90 tablet, Rfl: 3 .  betamethasone dipropionate 0.05 % cream, Apply 1 application topically as needed., Disp: , Rfl:  .  Biotin 10000 MCG  TABS, Take 1 tablet by mouth 1 day or 1 dose., Disp: , Rfl:  .  carbamazepine (TEGRETOL XR) 100 MG 12 hr tablet, , Disp: , Rfl:  .  fexofenadine (ALLEGRA) 180 MG tablet, Take 180 mg by mouth daily., Disp: , Rfl:  .  fluticasone (FLONASE) 50 MCG/ACT nasal spray, Place 2 sprays into both nostrils daily., Disp: 16 g, Rfl: 6 .  furosemide (LASIX) 40 MG tablet, Take 1 tablet (40 mg total) by mouth daily., Disp: 90 tablet, Rfl: 0 .  gabapentin (NEURONTIN) 300 MG capsule, Take 300 mg by mouth 2 (two) times daily. (Patient not taking: Reported on 01/24/2020), Disp: , Rfl:  .  gabapentin (NEURONTIN) 400 MG capsule, Take 400 mg by mouth 3 (three) times daily., Disp: , Rfl:  .  hydrOXYzine (ATARAX/VISTARIL) 10 MG tablet, Take 1 tablet (10 mg total) by mouth 3 (three) times daily as needed., Disp: 30 tablet, Rfl: 0 .  omeprazole (PRILOSEC) 20 MG capsule, TAKE 1 CAPSULE BY MOUTH  DAILY, Disp: 90 capsule, Rfl: 3 .  potassium chloride SA (KLOR-CON) 20 MEQ  tablet, Take 1 tablet (20 mEq total) by mouth daily., Disp: 30 tablet, Rfl: 3 .  rivaroxaban (XARELTO) 20 MG TABS tablet, Take 1 tablet (20 mg total) by mouth daily with supper., Disp: 30 tablet, Rfl: 5 .  tolterodine (DETROL) 2 MG tablet, TAKE 1 TABLET BY MOUTH  TWICE DAILY, Disp: 180 tablet, Rfl: 3 .  traZODone (DESYREL) 100 MG tablet, Take 100 mg by mouth at bedtime., Disp: , Rfl:  .  triamterene-hydrochlorothiazide (MAXZIDE-25) 37.5-25 MG tablet, TAKE 1 TABLET BY MOUTH  DAILY, Disp: 90 tablet, Rfl: 1 .  venlafaxine XR (EFFEXOR-XR) 75 MG 24 hr capsule, Take 1 capsule (75 mg total) by mouth daily., Disp: 90 capsule, Rfl: 3  Physical exam:  Vitals:   02/07/20 0824  BP: 121/72  Pulse: 84  Resp: 16  Temp: (!) 96.4 F (35.8 C)  TempSrc: Tympanic  SpO2: 100%  Weight: 184 lb (83.5 kg)   Physical Exam HENT:     Head: Normocephalic and atraumatic.  Eyes:     Pupils: Pupils are equal, round, and reactive to light.  Cardiovascular:     Rate and Rhythm:  Normal rate and regular rhythm.     Heart sounds: Normal heart sounds.  Pulmonary:     Effort: Pulmonary effort is normal.     Breath sounds: Normal breath sounds.  Abdominal:     General: Bowel sounds are normal.     Palpations: Abdomen is soft.  Musculoskeletal:     Cervical back: Normal range of motion.  Skin:    General: Skin is warm and dry.  Neurological:     Mental Status: She is alert and oriented to person, place, and time.      CMP Latest Ref Rng & Units 02/03/2020  Glucose 65 - 99 mg/dL 100(H)  BUN 8 - 27 mg/dL 22  Creatinine 0.57 - 1.00 mg/dL 0.94  Sodium 134 - 144 mmol/L 142  Potassium 3.5 - 5.2 mmol/L 4.1  Chloride 96 - 106 mmol/L 103  CO2 20 - 29 mmol/L 26  Calcium 8.7 - 10.3 mg/dL 8.8  Total Protein 6.5 - 8.1 g/dL -  Total Bilirubin 0.3 - 1.2 mg/dL -  Alkaline Phos 38 - 126 U/L -  AST 15 - 41 U/L -  ALT 0 - 44 U/L -   CBC Latest Ref Rng & Units 01/24/2020  WBC 4.0 - 10.5 K/uL 12.4(H)  Hemoglobin 12.0 - 15.0 g/dL 10.4(L)  Hematocrit 36 - 46 % 33.4(L)  Platelets 150 - 400 K/uL 239    No images are attached to the encounter.  DG Chest 2 View  Result Date: 01/19/2020 CLINICAL DATA:  Short of breath for 4 days. EXAM: CHEST - 2 VIEW COMPARISON:  10/05/2016 FINDINGS: Cardiac silhouette is normal in size. No mediastinal or hilar masses. No evidence of adenopathy. Lungs are hyperexpanded. There are thickened bronchial markings, most evident in the lung bases, mildly increased compared to the prior exam. This may be chronic, acute bronchial inflammation should be considered in the proper clinical setting. No evidence of pneumonia or pulmonary edema. No pleural effusion or pneumothorax. Stable spine stabilization rods.  No acute skeletal abnormality. IMPRESSION: 1. Thickened bronchial markings in the lung bases increased from the prior exam. Consider acute bronchial inflammation if there are consistent clinical findings. No evidence of pneumonia or pulmonary edema. 2. No  other evidence of acute cardiopulmonary disease. Electronically Signed   By: Lajean Manes M.D.   On: 01/19/2020 06:51   CT Angio Chest  PE W and/or Wo Contrast  Result Date: 01/19/2020 CLINICAL DATA:  Short of breath for 4 days. Has not taken her Xarelto in 1 week. EXAM: CT ANGIOGRAPHY CHEST WITH CONTRAST TECHNIQUE: Multidetector CT imaging of the chest was performed using the standard protocol during bolus administration of intravenous contrast. Multiplanar CT image reconstructions and MIPs were obtained to evaluate the vascular anatomy. CONTRAST:  70mL OMNIPAQUE IOHEXOL 350 MG/ML SOLN COMPARISON:  Current chest radiograph. FINDINGS: Cardiovascular: There is satisfactory opacification of the pulmonary arteries to the segmental level. There is no evidence of a pulmonary embolus. Heart is normal in size. No pericardial effusion. No coronary artery calcifications. Great vessels are normal in caliber. No aortic dissection or atherosclerosis. Arch branch vessels are widely patent. Mediastinum/Nodes: Normal thyroid. No neck base, mediastinal or hilar masses or enlarged lymph nodes. Normal trachea. Esophagus is mildly distended. Small hiatal hernia. Lungs/Pleura: Trace bilateral pleural effusions. Mild bilateral interstitial thickening. Additional patchy dependent opacity in the posterior lower lobes consistent with atelectasis. There is also mild opacity in the medial left lower lobe adjacent to the hiatal hernia consistent with atelectasis and minor atelectasis or scarring at the bases of the right middle lobe and left upper lobe lingula. No pneumothorax. Upper Abdomen: Unremarkable. Musculoskeletal: No fracture or acute finding. No osteoblastic or osteolytic lesions. Spine stabilization rods from the upper thoracic spine to below the included field of view. Review of the MIP images confirms the above findings. IMPRESSION: 1. No evidence of a pulmonary embolism. 2. Trace pleural effusions with interstitial thickening  consistent with mild pulmonary edema. 3. No evidence pneumonia. Electronically Signed   By: Lajean Manes M.D.   On: 01/19/2020 09:30   US Venous Img Lower Unilateral Right  Result Date: 01/24/2020 CLINICAL DATA:  Pain and swelling EXAM: RIGHT LOWER EXTREMITY VENOUS DOPPLER ULTRASOUND TECHNIQUE: Gray-scale sonography with compression, as well as color and duplex ultrasound, were performed to evaluate the deep venous system(s) from the level of the common femoral vein through the popliteal and proximal calf veins. COMPARISON:  None. FINDINGS: VENOUS There is no DVT identified in the right common femoral vein, right profunda femoral vein, right femoral vein, or tibial veins. There is a nonocclusive thrombus within the right popliteal vein. Limited views of the contralateral common femoral vein are unremarkable. OTHER None. Limitations: none IMPRESSION: Study is positive for a small, nonocclusive DVT within the right popliteal vein. Electronically Signed   By: Constance Holster M.D.   On: 01/24/2020 23:05     Assessment and plan- Patient is a 64 y.o. female with history of unprovoked extensive left lower extremity DVT now found to have right lower extremity DVT in the setting of noncompliance with Xarelto  After having recurrent DVT in June 2021 patient was put back on Xarelto since the cause of her recurrent DVT was missing doses of Xarelto rather than true anticoagulation failure.  Patient understands the importance of not missing any further doses of Xarelto.  I would like her to stay on this dose for next 6 months before deciding if she can potentially come off anticoagulation.   I will see her back in 6 months with CBC with differential and CMP and d dimer   Visit Diagnosis 1. History of recurrent deep vein thrombosis (DVT)      Dr. Randa Evens, MD, MPH [facility] [phone] 02/07/2020 8:25 AM

## 2020-02-07 NOTE — Telephone Encounter (Addendum)
Oral Oncology Patient Advocate Encounter  Dispensed samples to patient:  Medication: Xarelto 20mg  Instructions: Take 1 tablet by mouth daily with supper Quantity dispensed: 14 Days supply: 14 Manufacturer: [employer] Overall Lot: [id number] Exp: 10/21 Rph Verified: [place] Patient [place] Phone [phone] Fax [phone] 02/07/2020 9:07 AM

## 2020-02-09 ENCOUNTER — Encounter: Payer: Self-pay | Admitting: Oncology

## 2020-02-09 NOTE — Addendum Note (Signed)
Addended by: Sindy Guadeloupe on: 02/09/2020 09:52 PM   Modules accepted: Orders

## 2020-02-10 NOTE — Telephone Encounter (Signed)
Patient advised as below.  

## 2020-02-10 NOTE — Telephone Encounter (Signed)
Pt returned Suli's phone call/ Pt didn't hear the message/ please advise

## 2020-02-10 NOTE — Telephone Encounter (Signed)
Mammogram she is due at end of the month

## 2020-02-10 NOTE — Telephone Encounter (Signed)
Left detailed message for patient.

## 2020-02-10 NOTE — Addendum Note (Signed)
Addended by: Mar Daring on: 02/10/2020 11:36 AM   Modules accepted: Orders

## 2020-02-10 NOTE — Telephone Encounter (Signed)
Will order and they may talk to her about other options or hold off until she is at least 6 months out from her most recent DVT. Just FYI for her

## 2020-02-11 NOTE — Progress Notes (Signed)
Left a VM for patient per DPR regarding following result note:  "Normal creatinine and potassium. Continue medications as prescribed"

## 2020-02-19 ENCOUNTER — Telehealth (INDEPENDENT_AMBULATORY_CARE_PROVIDER_SITE_OTHER): Payer: Self-pay | Admitting: Gastroenterology

## 2020-02-19 ENCOUNTER — Other Ambulatory Visit: Payer: Self-pay

## 2020-02-19 ENCOUNTER — Other Ambulatory Visit: Payer: Self-pay | Admitting: Oncology

## 2020-02-19 DIAGNOSIS — Z1211 Encounter for screening for malignant neoplasm of colon: Secondary | ICD-10-CM

## 2020-02-19 NOTE — Progress Notes (Signed)
Gastroenterology Pre-Procedure Review  Request Date: Monday 03/23/20 Requesting Physician: Dr. Allen Norris  PATIENT REVIEW QUESTIONS: The patient responded to the following health history questions as indicated:    1. Are you having any GI issues? no 2. Do you have a personal history of Polyps? no 3. Do you have a family history of Colon Cancer or Polyps? yes (mother colon cancer) 4. Diabetes Mellitus? no 5. Joint replacements in the past 12 months?no 6. Major health problems in the past 3 months?no 7. Any artificial heart valves, MVP, or defibrillator?no    MEDICATIONS & ALLERGIES:    Patient reports the following regarding taking any anticoagulation/antiplatelet therapy:   Plavix, Coumadin, Eliquis, Xarelto, Lovenox, Pradaxa, Brilinta, or Effient? yes (Xarelto prescribed by Dr. [name] Blood Thinner Request sent to Dr. [name]) Aspirin? no  Patient confirms/reports the following medications:  Current Outpatient Medications  Medication Sig Dispense Refill  . atenolol (TENORMIN) 25 MG tablet Take 1 tablet (25 mg total) by mouth daily. 90 tablet 3  . betamethasone dipropionate 0.05 % cream Apply 1 application topically as needed.    . Biotin 10000 MCG TABS Take 1 tablet by mouth 1 day or 1 dose.    . carbamazepine (TEGRETOL XR) 100 MG 12 hr tablet     . fexofenadine (ALLEGRA) 180 MG tablet Take 180 mg by mouth daily.    . fluticasone (FLONASE) 50 MCG/ACT nasal spray Place 2 sprays into both nostrils daily. 16 g 6  . furosemide (LASIX) 40 MG tablet Take 1 tablet (40 mg total) by mouth daily. 90 tablet 0  . gabapentin (NEURONTIN) 300 MG capsule Take 300 mg by mouth 2 (two) times daily.     [name] gabapentin (NEURONTIN) 400 MG capsule Take 400 mg by mouth 3 (three) times daily.    . hydrOXYzine (ATARAX/VISTARIL) 10 MG tablet Take 1 tablet (10 mg total) by mouth 3 (three) times daily as needed. 30 tablet 0  . omeprazole (PRILOSEC) 20 MG capsule TAKE 1 CAPSULE BY MOUTH  DAILY 90 capsule 3  . potassium  chloride SA (KLOR-CON) 20 MEQ tablet Take 1 tablet (20 mEq total) by mouth daily. 30 tablet 3  . tolterodine (DETROL) 2 MG tablet TAKE 1 TABLET BY MOUTH  TWICE DAILY 180 tablet 3  . traZODone (DESYREL) 100 MG tablet Take 100 mg by mouth at bedtime.    . triamterene-hydrochlorothiazide (MAXZIDE-25) 37.5-25 MG tablet TAKE 1 TABLET BY MOUTH  DAILY 90 tablet 1  . venlafaxine XR (EFFEXOR-XR) 75 MG 24 hr capsule Take 1 capsule (75 mg total) by mouth daily. 90 capsule 3  . XARELTO 20 MG TABS tablet TAKE 1 TABLET BY MOUTH  DAILY WITH SUPPER 90 tablet 3  . Biotin 1 MG CAPS      No current facility-administered medications for this visit.    Patient confirms/reports the following allergies:  No Known Allergies  No orders of the defined types were placed in this encounter.   AUTHORIZATION INFORMATION Primary Insurance: 1D#: Group #:  Secondary Insurance: 1D#: Group #:  SCHEDULE INFORMATION: Date: Monday 03/23/20 Time: Location:[facility]

## 2020-02-20 ENCOUNTER — Telehealth: Payer: Self-pay

## 2020-02-20 NOTE — Telephone Encounter (Signed)
Patient has been advised per Dr.Rao blood thinner request received by fax today to stop Xarelto 2 days prior to colonoscopy and restart right after colonoscopy. Patient verbalized understanding and took note of this.  Thanks,  [place], [place]

## 2020-02-23 ENCOUNTER — Other Ambulatory Visit: Payer: Self-pay | Admitting: Physician Assistant

## 2020-02-23 DIAGNOSIS — L299 Pruritus, unspecified: Secondary | ICD-10-CM

## 2020-03-02 ENCOUNTER — Other Ambulatory Visit: Payer: Self-pay

## 2020-03-02 ENCOUNTER — Ambulatory Visit (INDEPENDENT_AMBULATORY_CARE_PROVIDER_SITE_OTHER): Payer: Managed Care, Other (non HMO)

## 2020-03-02 DIAGNOSIS — R0609 Other forms of dyspnea: Secondary | ICD-10-CM

## 2020-03-02 DIAGNOSIS — R0600 Dyspnea, unspecified: Secondary | ICD-10-CM

## 2020-03-02 DIAGNOSIS — I10 Essential (primary) hypertension: Secondary | ICD-10-CM

## 2020-03-02 DIAGNOSIS — R600 Localized edema: Secondary | ICD-10-CM

## 2020-03-02 LAB — ECHOCARDIOGRAM COMPLETE
AR max vel: 3.07 cm2
AV Peak grad: 6.2 mmHg
Ao pk vel: 1.24 m/s
Area-P 1/2: 4.49 cm2
Calc EF: 57.6 %
MV M vel: 4.48 m/s
MV Peak grad: 80.3 mmHg
MV VTI: 0.05 cm2
MV Vena cont: 0.3 cm
P 1/2 time: 90.9 msec
Radius: 0.3 cm
S' Lateral: 3 cm
Single Plane A2C EF: 51 %
Single Plane A4C EF: 61.1 %

## 2020-03-04 ENCOUNTER — Other Ambulatory Visit: Payer: Self-pay

## 2020-03-04 MED ORDER — FUROSEMIDE 40 MG PO TABS: 40.0000 mg | ORAL_TABLET | Freq: Every day | ORAL | 0 refills | Status: DC

## 2020-03-06 ENCOUNTER — Ambulatory Visit: Payer: Managed Care, Other (non HMO) | Admitting: Cardiology

## 2020-03-06 ENCOUNTER — Other Ambulatory Visit: Payer: Self-pay

## 2020-03-06 ENCOUNTER — Encounter: Payer: Self-pay | Admitting: Cardiology

## 2020-03-06 VITALS — BP 140/90 | HR 76 | Ht 66.0 in | Wt 178.0 lb

## 2020-03-06 DIAGNOSIS — I10 Essential (primary) hypertension: Secondary | ICD-10-CM

## 2020-03-06 DIAGNOSIS — R0600 Dyspnea, unspecified: Secondary | ICD-10-CM | POA: Diagnosis not present

## 2020-03-06 DIAGNOSIS — R0609 Other forms of dyspnea: Secondary | ICD-10-CM

## 2020-03-06 DIAGNOSIS — R600 Localized edema: Secondary | ICD-10-CM

## 2020-03-06 DIAGNOSIS — I82402 Acute embolism and thrombosis of unspecified deep veins of left lower extremity: Secondary | ICD-10-CM | POA: Diagnosis not present

## 2020-03-06 MED ORDER — METOPROLOL TARTRATE 100 MG PO TABS
ORAL_TABLET | ORAL | 0 refills | Status: DC
Start: 2020-03-06 — End: 2020-08-25

## 2020-03-06 NOTE — Patient Instructions (Addendum)
Medication Instructions:  - Your physician recommends that you continue on your current medications as directed. Please refer to the Current Medication list given to you today.  *If you need a refill on your cardiac medications before your next appointment, please call your pharmacy*   Lab Work: - none ordered  If you have labs (blood work) drawn today and your tests are completely normal, you will receive your results only by: [name] MyChart Message (if you have MyChart) OR . A paper copy in the mail If you have any lab test that is abnormal or we need to change your treatment, we will call you to review the results.   Testing/Procedures: 1) Cardiac CT angiogram - Your physician has requested that you have cardiac CT. Cardiac computed tomography (CT) is a painless test that uses an x-ray machine to take clear, detailed pictures of your heart.   - Your cardiac CT will be scheduled at one of the below locations:   [facility] [address] [place], [place] [address] [phone]  [place] [address] [place], [place] [address] [phone]  If scheduled at [facility], please arrive at the [facility] main entrance of [facility] 30 minutes prior to test start time. Proceed to the [facility] Radiology Department (first floor) to check-in and test prep.  If scheduled at [facility], please arrive 15 mins early for check-in and test prep.  Please follow these instructions carefully (unless otherwise directed):  On the Night Before the Test: . Be sure to Drink plenty of water. . Do not consume any caffeinated/decaffeinated beverages or chocolate 12 hours prior to your test. . Do not take any antihistamines 12 hours prior to your test.   On the Day of the Test: . Drink plenty of water. Do not drink any water within one hour of the test. . Do not eat any food 4 hours  prior to the test. . You may take your regular medications prior to the test.  . Take metoprolol (Lopressor) 100 mg two hours prior to test x 1 dose . HOLD Furosemide morning of the test. . FEMALES- please wear underwire-free bra if available       After the Test: . Drink plenty of water. . After receiving IV contrast, you may experience a mild flushed feeling. This is normal. . On occasion, you may experience a mild rash up to 24 hours after the test. This is not dangerous. If this occurs, you can take Benadryl 25 mg and increase your fluid intake. . If you experience trouble breathing, this can be serious. If it is severe call 911 IMMEDIATELY. If it is mild, please call our office.  Once we have confirmed authorization from your insurance company, we will call you to set up a date and time for your test. Based on how quickly your insurance processes prior authorizations requests, please allow up to 4 weeks to be contacted for scheduling your Cardiac CT appointment. Be advised that routine Cardiac CT appointments could be scheduled as many as 8 weeks after your provider has ordered it.  For non-scheduling related questions, please contact the cardiac imaging nurse navigator should you have any questions/concerns: Marchia Bond, Cardiac Imaging Nurse Navigator Burley Saver, Interim Cardiac Imaging Nurse [place] [facility] Direct Office Dial: [phone]   For scheduling needs, including cancellations and rescheduling, please call Vivien Rota at [phone], option 3.  Follow-Up: At [facility], you and your health needs are our priority.  As part of our continuing mission to provide you with exceptional heart care, we have created designated Provider Care Teams.  These Care Teams include your primary Cardiologist (physician) and Advanced Practice Providers (APPs -  Physician Assistants and Nurse Practitioners) who all work together to provide you with the care you  need, when you need it.  We recommend signing up for the patient portal called "MyChart".  Sign up information is provided on this After Visit Summary.  MyChart is used to connect with patients for Virtual Visits (Telemedicine).  Patients are able to view lab/test results, encounter notes, upcoming appointments, etc.  Non-urgent messages can be sent to your provider as well.   To learn more about what you can do with MyChart, go to [email or web address].    Your next appointment:   5-6 week(s)  The format for your next appointment:   In Person  Provider:   Kate Sable, MD   Other Instructions   Cardiac CT Angiogram A cardiac CT angiogram is a procedure to look at the heart and the area around the heart. It may be done to help find the cause of chest pains or other symptoms of heart disease. During this procedure, a substance called contrast dye is injected into the blood vessels in the area to be checked. A large X-ray machine, called a CT scanner, then takes detailed pictures of the heart and the surrounding area. The procedure is also sometimes called a coronary CT angiogram, coronary artery scanning, or CTA. A cardiac CT angiogram allows the health care provider to see how well blood is flowing to and from the heart. The health care provider will be able to see if there are any problems, such as:  Blockage or narrowing of the coronary arteries in the heart.  Fluid around the heart.  Signs of weakness or disease in the muscles, valves, and tissues of the heart. Tell a health care provider about:  Any allergies you have. This is especially important if you have had a previous allergic reaction to contrast dye.  All medicines you are taking, including vitamins, herbs, eye drops, creams, and over-the-counter medicines.  Any blood disorders you have.  Any surgeries you have had.  Any medical conditions you have.  Whether you are pregnant or may be pregnant.  Any anxiety  disorders, chronic pain, or other conditions you have that may increase your stress or prevent you from lying still. What are the risks? Generally, this is a safe procedure. However, problems may occur, including:  Bleeding.  Infection.  Allergic reactions to medicines or dyes.  Damage to other structures or organs.  Kidney damage from the contrast dye that is used.  Increased risk of cancer from radiation exposure. This risk is low. Talk with your health care provider about: ? The risks and benefits of testing. ? How you can receive the lowest dose of radiation. What happens before the procedure?  Wear comfortable clothing and remove any jewelry, glasses, dentures, and hearing aids.  Follow instructions from your health care provider about eating and drinking. This may include: ? For 12 hours before the procedure -- avoid caffeine. This includes tea, coffee, soda, energy drinks, and diet pills. Drink plenty of water or other fluids that do not have caffeine in them. Being well hydrated can prevent complications. ? For 4-6 hours before the procedure -- stop eating and drinking. The contrast dye can cause nausea,  but this is less likely if your stomach is empty.  Ask your health care provider about changing or stopping your regular medicines. This is especially important if you are taking diabetes medicines, blood thinners, or medicines to treat problems with erections (erectile dysfunction). What happens during the procedure?   Hair on your chest may need to be removed so that small sticky patches called electrodes can be placed on your chest. These will transmit information that helps to monitor your heart during the procedure.  An IV will be inserted into one of your veins.  You might be given a medicine to control your heart rate during the procedure. This will help to ensure that good images are obtained.  You will be asked to lie on an exam table. This table will slide in and  out of the CT machine during the procedure.  Contrast dye will be injected into the IV. You might feel warm, or you may get a metallic taste in your mouth.  You will be given a medicine called nitroglycerin. This will relax or dilate the arteries in your heart.  The table that you are lying on will move into the CT machine tunnel for the scan.  The person running the machine will give you instructions while the scans are being done. You may be asked to: ? Keep your arms above your head. ? Hold your breath. ? Stay very still, even if the table is moving.  When the scanning is complete, you will be moved out of the machine.  The IV will be removed. The procedure may vary among health care providers and hospitals. What can I expect after the procedure? After your procedure, it is common to have:  A metallic taste in your mouth from the contrast dye.  A feeling of warmth.  A headache from the nitroglycerin. Follow these instructions at home:  Take over-the-counter and prescription medicines only as told by your health care provider.  If you are told, drink enough fluid to keep your urine pale yellow. This will help to flush the contrast dye out of your body.  Most people can return to their normal activities right after the procedure. Ask your health care provider what activities are safe for you.  It is up to you to get the results of your procedure. Ask your health care provider, or the department that is doing the procedure, when your results will be ready.  Keep all follow-up visits as told by your health care provider. This is important. Contact a health care provider if:  You have any symptoms of allergy to the contrast dye. These include: ? Shortness of breath. ? Rash or hives. ? A racing heartbeat. Summary  A cardiac CT angiogram is a procedure to look at the heart and the area around the heart. It may be done to help find the cause of chest pains or other symptoms of  heart disease.  During this procedure, a large X-ray machine, called a CT scanner, takes detailed pictures of the heart and the surrounding area after a contrast dye has been injected into blood vessels in the area.  Ask your health care provider about changing or stopping your regular medicines before the procedure. This is especially important if you are taking diabetes medicines, blood thinners, or medicines to treat erectile dysfunction.  If you are told, drink enough fluid to keep your urine pale yellow. This will help to flush the contrast dye out of your body. This information  is not intended to replace advice given to you by your health care provider. Make sure you discuss any questions you have with your health care provider. Document Revised: 03/27/2019 Document Reviewed: 03/27/2019 Elsevier Patient Education  [place].

## 2020-03-06 NOTE — Progress Notes (Signed)
Cardiology Office Note:    Date:  03/06/2020   ID:  [name], DOB 1955/10/16, MRN [id number]  PCP:  Kristi Daring, PA-C  [facility] Cardiologist:  Kate Sable, MD  [place] Electrophysiologist:  None   Referring MD: Kristi Buff*   Chief Complaint  Patient presents with  . office visit    F/U after echo    History of Present Illness:    Kristi Lee is a 64 y.o. 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  He was last seen due to shortness of breath and lower extremity edema.  Echocardiogram ordered to evaluate LV function, Lasix was increased to help with edema.  Patient still complains of dyspnea on exertion.  Edema has improved since starting Lasix.  Prior CT showed some pleural effusion.   Historical notes In the ED, blood pressures were noted to be elevated at 180/85, EKG showed normal sinus rhythm, CT angio chest with no acute PE, but showed small pleural effusions and mild edema. Started on lasix with improvement in symptoms. Patient was diagnosed with extensive left lower extremity DVT involving most of the left common femoral vein as well as the popliteal and calf veins in March 2021.  She underwent catheter directed thrombolysis with TPA by vascular surgery and started on Xarelto.   Past Medical History:  Diagnosis Date  . Depression   . GERD (gastroesophageal reflux disease)   . Hypertension     Past Surgical History:  Procedure Laterality Date  . ABDOMINAL HYSTERECTOMY    . BACK SURGERY  10/2000  . BREAST EXCISIONAL BIOPSY Right 1990's   NEG  . FEMUR FRACTURE SURGERY Right 07/15/2009   Pertrochanteric femur fracture.  Kristi Lee HIP FRACTURE SURGERY Right   . PERIPHERAL VASCULAR THROMBECTOMY Left 11/11/2019   Procedure: PERIPHERAL VASCULAR THROMBECTOMY;  Surgeon: Algernon Huxley, MD;  Location: [facility];  Service: Cardiovascular;  Laterality: Left;  .  REVISION TOTAL HIP ARTHROPLASTY Left     Current Medications: Current Meds  Medication Sig  . gabapentin (NEURONTIN) 300 MG capsule Take 300 mg by mouth 3 (three) times daily.     Allergies:   Patient has no known allergies.   Social History   Socioeconomic History  . Marital status: Married    Spouse name: Not on file  . Number of children: 1  . Years of education: HS  . Highest education level: Not on file  Occupational History  . Occupation: Industrial/product designer: [employer]  Tobacco Use  . Smoking status: Never Smoker  . Smokeless tobacco: Never Used  Vaping Use  . Vaping Use: Never used  Substance and Sexual Activity  . Alcohol use: No  . Drug use: No  . Sexual activity: Not on file  Other Topics Concern  . Not on file  Social History Narrative   Lives at home in private residence with husband   Social Determinants of Health   Financial Resource Strain:   . Difficulty of Paying Living Expenses:   Food Insecurity:   . Worried About Charity fundraiser in the Last Year:   . Arboriculturist in the Last Year:   Transportation Needs:   . Film/video editor (Medical):   Kristi Lee Lack of Transportation (Non-Medical):   Physical Activity:   . Days of Exercise per Week:   . Minutes of Exercise per Session:   Stress:   .  Feeling of Stress :   Social Connections:   . Frequency of Communication with Friends and Family:   . Frequency of Social Gatherings with Friends and Family:   . Attends Religious Services:   . Active Member of Clubs or Organizations:   . Attends Archivist Meetings:   Kristi Lee Marital Status:      Family History: The patient's family history includes Diabetes in her brother; Lung cancer in her father; Osteoarthritis in her mother. There is no history of Breast cancer.  ROS:   Please see the history of present illness.     All other systems reviewed and are negative.  EKGs/Labs/Other Studies Reviewed:    The following studies  were reviewed today:   EKG:  EKG is  ordered today.  The ekg ordered today demonstrates normal sinus rhythm  Recent Labs: 04/26/2019: TSH 1.610 01/19/2020: B Natriuretic Peptide 183.4 01/24/2020: ALT 59; Hemoglobin 10.4; Platelets 239 02/03/2020: BUN 22; Creatinine, Ser 0.94; Potassium 4.1; Sodium 142  Recent Lipid Panel    Component Value Date/Time   CHOL 189 04/26/2019 0910   TRIG 111 04/26/2019 0910   HDL 67 04/26/2019 0910   CHOLHDL 2.8 04/26/2019 0910   LDLCALC 102 (H) 04/26/2019 0910    Physical Exam:    VS:  BP (!) 140/90 (BP Location: Left Arm, Patient Position: Sitting, Cuff Size: Normal)   Pulse 76   Ht '5\' 6"'  (1.676 m)   Wt 178 lb (80.7 kg)   SpO2 96%   BMI 28.73 kg/m     Wt Readings from Last 3 Encounters:  03/06/20 178 lb (80.7 kg)  02/07/20 184 lb (83.5 kg)  01/27/20 178 lb 12.8 oz (81.1 kg)     GEN:  Well nourished, well developed in no acute distress HEENT: Normal NECK: No JVD; No carotid bruits LYMPHATICS: No lymphadenopathy CARDIAC: RRR, no murmurs, rubs, gallops RESPIRATORY:  Clear to auscultation without rales, wheezing or rhonchi  ABDOMEN: Soft, non-tender, non-distended MUSCULOSKELETAL:  Trace to 1+ edema; No deformity  SKIN: Warm and dry, erythematous rash. NEUROLOGIC:  Alert and oriented x 3 PSYCHIATRIC:  Normal affect   ASSESSMENT:    1. Dyspnea on exertion   2. Edema leg   3. Deep vein thrombosis (DVT) of left lower extremity, unspecified chronicity, unspecified vein (HCC)   4. Essential hypertension    PLAN:    In order of problems listed above:  1. Patient with dyspnea on exertion and edema on CT. echocardiogram showed normal systolic and diastolic function, mild LVH, EF 55 to 60%.  Patient with persistent dyspnea on exertion.  This is an anginal equivalent, especially being female sometimes present with atypical symptoms.  She has risk factors of age, hypertension.  Get coronary CTA to evaluate presence of CAD as contributing to  symptoms.  If CTA is normal, will consider pulmonary eval.  Also will perform graduated exercising as deconditioning may have a role. 2. Lower extremity edema has improved on todays exam, cont Lasix to 40 mg daily.  DVT likely contributing to edema. 3. History of DVT, on Xarelto.  Keep appointments with Vascular surgery. 4. History of hypertension, continue BP meds as prescribed  Follow-up after coronary CTA.  Total encounter time 40 minutes  Greater than 50% was spent in counseling and coordination of care with the patient   This note was generated in part or whole with voice recognition software. Voice recognition is usually quite accurate but there are transcription errors that can and very often do  occur. I apologize for any typographical errors that were not detected and corrected.  Medication Adjustments/Labs and Tests Ordered: Current medicines are reviewed at length with the patient today.  Concerns regarding medicines are outlined above.  Orders Placed This Encounter  Procedures  . CT CORONARY MORPH W/CTA COR W/SCORE W/CA W/CM &/OR WO/CM  . CT CORONARY FRACTIONAL FLOW RESERVE DATA PREP  . CT CORONARY FRACTIONAL FLOW RESERVE FLUID ANALYSIS  . EKG 12-Lead   Meds ordered this encounter  Medications  . metoprolol tartrate (LOPRESSOR) 100 MG tablet    Sig: Take 1 tablet (100 mg) by mouth 2 hours prior to your Cardiac CT scan    Dispense:  1 tablet    Refill:  0    Patient Instructions  Medication Instructions:  - Your physician recommends that you continue on your current medications as directed. Please refer to the Current Medication list given to you today.  *If you need a refill on your cardiac medications before your next appointment, please call your pharmacy*   Lab Work: - none ordered  If you have labs (blood work) drawn today and your tests are completely normal, you will receive your results only by: Kristi Lee MyChart Message (if you have MyChart) OR . A paper copy in  the mail If you have any lab test that is abnormal or we need to change your treatment, we will call you to review the results.   Testing/Procedures: 1) Cardiac CT angiogram - Your physician has requested that you have cardiac CT. Cardiac computed tomography (CT) is a painless test that uses an x-ray machine to take clear, detailed pictures of your heart.   - Your cardiac CT will be scheduled at one of the below locations:   [facility] [address] [place], [place] [address] [phone]  [facility] [address] [place], [place] [address] [phone]  If scheduled at [facility], please arrive at the [facility] main entrance of [facility] 30 minutes prior to test start time. Proceed to the [facility] Radiology Department (first floor) to check-in and test prep.  If scheduled at [facility], please arrive 15 mins early for check-in and test prep.  Please follow these instructions carefully (unless otherwise directed):  On the Night Before the Test: . Be sure to Drink plenty of water. . Do not consume any caffeinated/decaffeinated beverages or chocolate 12 hours prior to your test. . Do not take any antihistamines 12 hours prior to your test.   On the Day of the Test: . Drink plenty of water. Do not drink any water within one hour of the test. . Do not eat any food 4 hours prior to the test. . You may take your regular medications prior to the test.  . Take metoprolol (Lopressor) 100 mg two hours prior to test x 1 dose . HOLD Furosemide morning of the test. . FEMALES- please wear underwire-free bra if available       After the Test: . Drink plenty of water. . After receiving IV contrast, you may experience a mild flushed feeling. This is normal. . On occasion, you may experience a mild rash up to 24 hours after the test. This is not dangerous. If this occurs,  you can take Benadryl 25 mg and increase your fluid intake. . If you experience trouble breathing, this can be serious. If it is severe call 911 IMMEDIATELY. If it is  mild, please call our office.  Once we have confirmed authorization from your insurance company, we will call you to set up a date and time for your test. Based on how quickly your insurance processes prior authorizations requests, please allow up to 4 weeks to be contacted for scheduling your Cardiac CT appointment. Be advised that routine Cardiac CT appointments could be scheduled as many as 8 weeks after your provider has ordered it.  For non-scheduling related questions, please contact the cardiac imaging nurse navigator should you have any questions/concerns: Marchia Bond, Cardiac Imaging Nurse Navigator Burley Saver, Interim Cardiac Imaging Nurse [place] [facility] Direct Office Dial: [phone]   For scheduling needs, including cancellations and rescheduling, please call Vivien Rota at [phone], option 3.    Follow-Up: At [facility], you and your health needs are our priority.  As part of our continuing mission to provide you with exceptional heart care, we have created designated Provider Care Teams.  These Care Teams include your primary Cardiologist (physician) and Advanced Practice Providers (APPs -  Physician Assistants and Nurse Practitioners) who all work together to provide you with the care you need, when you need it.  We recommend signing up for the patient portal called "MyChart".  Sign up information is provided on this After Visit Summary.  MyChart is used to connect with patients for Virtual Visits (Telemedicine).  Patients are able to view lab/test results, encounter notes, upcoming appointments, etc.  Non-urgent messages can be sent to your provider as well.   To learn more about what you can do with MyChart, go to [email or web address].    Your next appointment:   5-6  week(s)  The format for your next appointment:   In Person  Provider:   Kate Sable, MD   Other Instructions   Cardiac CT Angiogram A cardiac CT angiogram is a procedure to look at the heart and the area around the heart. It may be done to help find the cause of chest pains or other symptoms of heart disease. During this procedure, a substance called contrast dye is injected into the blood vessels in the area to be checked. A large X-ray machine, called a CT scanner, then takes detailed pictures of the heart and the surrounding area. The procedure is also sometimes called a coronary CT angiogram, coronary artery scanning, or CTA. A cardiac CT angiogram allows the health care provider to see how well blood is flowing to and from the heart. The health care provider will be able to see if there are any problems, such as:  Blockage or narrowing of the coronary arteries in the heart.  Fluid around the heart.  Signs of weakness or disease in the muscles, valves, and tissues of the heart. Tell a health care provider about:  Any allergies you have. This is especially important if you have had a previous allergic reaction to contrast dye.  All medicines you are taking, including vitamins, herbs, eye drops, creams, and over-the-counter medicines.  Any blood disorders you have.  Any surgeries you have had.  Any medical conditions you have.  Whether you are pregnant or may be pregnant.  Any anxiety disorders, chronic pain, or other conditions you have that may increase your stress or prevent you from lying still. What are the risks? Generally, this is a safe procedure. However, problems may occur, including:  Bleeding.  Infection.  Allergic reactions to medicines or dyes.  Damage to other structures or organs.  Kidney damage from  the contrast dye that is used.  Increased risk of cancer from radiation exposure. This risk is low. Talk with your health care provider  about: ? The risks and benefits of testing. ? How you can receive the lowest dose of radiation. What happens before the procedure?  Wear comfortable clothing and remove any jewelry, glasses, dentures, and hearing aids.  Follow instructions from your health care provider about eating and drinking. This may include: ? For 12 hours before the procedure -- avoid caffeine. This includes tea, coffee, soda, energy drinks, and diet pills. Drink plenty of water or other fluids that do not have caffeine in them. Being well hydrated can prevent complications. ? For 4-6 hours before the procedure -- stop eating and drinking. The contrast dye can cause nausea, but this is less likely if your stomach is empty.  Ask your health care provider about changing or stopping your regular medicines. This is especially important if you are taking diabetes medicines, blood thinners, or medicines to treat problems with erections (erectile dysfunction). What happens during the procedure?   Hair on your chest may need to be removed so that small sticky patches called electrodes can be placed on your chest. These will transmit information that helps to monitor your heart during the procedure.  An IV will be inserted into one of your veins.  You might be given a medicine to control your heart rate during the procedure. This will help to ensure that good images are obtained.  You will be asked to lie on an exam table. This table will slide in and out of the CT machine during the procedure.  Contrast dye will be injected into the IV. You might feel warm, or you may get a metallic taste in your mouth.  You will be given a medicine called nitroglycerin. This will relax or dilate the arteries in your heart.  The table that you are lying on will move into the CT machine tunnel for the scan.  The person running the machine will give you instructions while the scans are being done. You may be asked to: ? Keep your arms  above your head. ? Hold your breath. ? Stay very still, even if the table is moving.  When the scanning is complete, you will be moved out of the machine.  The IV will be removed. The procedure may vary among health care providers and hospitals. What can I expect after the procedure? After your procedure, it is common to have:  A metallic taste in your mouth from the contrast dye.  A feeling of warmth.  A headache from the nitroglycerin. Follow these instructions at home:  Take over-the-counter and prescription medicines only as told by your health care provider.  If you are told, drink enough fluid to keep your urine pale yellow. This will help to flush the contrast dye out of your body.  Most people can return to their normal activities right after the procedure. Ask your health care provider what activities are safe for you.  It is up to you to get the results of your procedure. Ask your health care provider, or the department that is doing the procedure, when your results will be ready.  Keep all follow-up visits as told by your health care provider. This is important. Contact a health care provider if:  You have any symptoms of allergy to the contrast dye. These include: ? Shortness of breath. ? Rash or hives. ? A racing heartbeat. Summary  A cardiac CT angiogram is a procedure to look at the heart and the area around the heart. It may be done to help find the cause of chest pains or other symptoms of heart disease.  During this procedure, a large X-ray machine, called a CT scanner, takes detailed pictures of the heart and the surrounding area after a contrast dye has been injected into blood vessels in the area.  Ask your health care provider about changing or stopping your regular medicines before the procedure. This is especially important if you are taking diabetes medicines, blood thinners, or medicines to treat erectile dysfunction.  If you are told, drink enough  fluid to keep your urine pale yellow. This will help to flush the contrast dye out of your body. This information is not intended to replace advice given to you by your health care provider. Make sure you discuss any questions you have with your health care provider. Document Revised: 03/27/2019 Document Reviewed: 03/27/2019 Elsevier Patient Education  2020 [place], Kate Sable, MD  03/06/2020 5:21 PM    [place]

## 2020-03-12 ENCOUNTER — Other Ambulatory Visit: Payer: Self-pay

## 2020-03-12 DIAGNOSIS — E876 Hypokalemia: Secondary | ICD-10-CM

## 2020-03-12 MED ORDER — POTASSIUM CHLORIDE CRYS ER 20 MEQ PO TBCR: 20.0000 meq | EXTENDED_RELEASE_TABLET | Freq: Every day | ORAL | 1 refills | Status: DC

## 2020-03-16 ENCOUNTER — Ambulatory Visit
Admission: RE | Admit: 2020-03-16 | Discharge: 2020-03-16 | Disposition: A | Discharge status: Home / Self Care | Payer: Managed Care, Other (non HMO) | Source: Ambulatory Visit | Attending: Physician Assistant | Admitting: Physician Assistant

## 2020-03-16 ENCOUNTER — Other Ambulatory Visit: Payer: Self-pay

## 2020-03-16 DIAGNOSIS — Z1231 Encounter for screening mammogram for malignant neoplasm of breast: Secondary | ICD-10-CM | POA: Insufficient documentation

## 2020-03-16 DIAGNOSIS — Z1239 Encounter for other screening for malignant neoplasm of breast: Secondary | ICD-10-CM

## 2020-03-17 ENCOUNTER — Other Ambulatory Visit: Payer: Self-pay

## 2020-03-17 ENCOUNTER — Encounter: Payer: Self-pay | Admitting: Gastroenterology

## 2020-03-18 ENCOUNTER — Telehealth: Payer: Self-pay

## 2020-03-18 NOTE — Telephone Encounter (Signed)
-----   Message from Mar Daring, [place] sent at 03/18/2020 11:08 AM EDT ----- Normal mammogram. Repeat screening in one year.

## 2020-03-18 NOTE — Telephone Encounter (Signed)
LMTCB 03/18/2020.  PEC please advise pt of her mammogram results below.   Thanks,   -Mickel Baas

## 2020-03-19 ENCOUNTER — Other Ambulatory Visit: Payer: Self-pay

## 2020-03-19 ENCOUNTER — Other Ambulatory Visit
Admission: RE | Admit: 2020-03-19 | Discharge: 2020-03-19 | Disposition: A | Discharge status: Home / Self Care | Payer: Managed Care, Other (non HMO) | Source: Ambulatory Visit | Attending: Gastroenterology | Admitting: Gastroenterology

## 2020-03-19 DIAGNOSIS — Z01812 Encounter for preprocedural laboratory examination: Secondary | ICD-10-CM | POA: Diagnosis not present

## 2020-03-19 DIAGNOSIS — Z20822 Contact with and (suspected) exposure to covid-19: Secondary | ICD-10-CM | POA: Diagnosis not present

## 2020-03-19 LAB — SARS CORONAVIRUS 2 (TAT 6-24 HRS): SARS Coronavirus 2: NEGATIVE

## 2020-03-19 NOTE — Telephone Encounter (Signed)
Attempted to call pt.  Left vm to return call to office for Mammogram results.  °

## 2020-03-20 NOTE — Telephone Encounter (Signed)
Patient advised as directed below. 

## 2020-03-20 NOTE — Discharge Instructions (Signed)
General Anesthesia, Adult, Care After This sheet gives you information about how to care for yourself after your procedure. Your health care provider may also give you more specific instructions. If you have problems or questions, contact your health care provider. What can I expect after the procedure? After the procedure, the following side effects are common:  Pain or discomfort at the IV site.  Nausea.  Vomiting.  Sore throat.  Trouble concentrating.  Feeling cold or chills.  Weak or tired.  Sleepiness and fatigue.  Soreness and body aches. These side effects can affect parts of the body that were not involved in surgery. Follow these instructions at home:  For at least 24 hours after the procedure:  Have a responsible adult stay with you. It is important to have someone help care for you until you are awake and alert.  Rest as needed.  Do not: ? Participate in activities in which you could fall or become injured. ? Drive. ? Use heavy machinery. ? Drink alcohol. ? Take sleeping pills or medicines that cause drowsiness. ? Make important decisions or sign legal documents. ? Take care of children on your own. Eating and drinking  Follow any instructions from your health care provider about eating or drinking restrictions.  When you feel hungry, start by eating small amounts of foods that are soft and easy to digest (bland), such as toast. Gradually return to your regular diet.  Drink enough fluid to keep your urine pale yellow.  If you vomit, rehydrate by drinking water, juice, or clear broth. General instructions  If you have sleep apnea, surgery and certain medicines can increase your risk for breathing problems. Follow instructions from your health care provider about wearing your sleep device: ? Anytime you are sleeping, including during daytime naps. ? While taking prescription pain medicines, sleeping medicines, or medicines that make you drowsy.  Return to  your normal activities as told by your health care provider. Ask your health care provider what activities are safe for you.  Take over-the-counter and prescription medicines only as told by your health care provider.  If you smoke, do not smoke without supervision.  Keep all follow-up visits as told by your health care provider. This is important. Contact a health care provider if:  You have nausea or vomiting that does not get better with medicine.  You cannot eat or drink without vomiting.  You have pain that does not get better with medicine.  You are unable to pass urine.  You develop a skin rash.  You have a fever.  You have redness around your IV site that gets worse. Get help right away if:  You have difficulty breathing.  You have chest pain.  You have blood in your urine or stool, or you vomit blood. Summary  After the procedure, it is common to have a sore throat or nausea. It is also common to feel tired.  Have a responsible adult stay with you for the first 24 hours after general anesthesia. It is important to have someone help care for you until you are awake and alert.  When you feel hungry, start by eating small amounts of foods that are soft and easy to digest (bland), such as toast. Gradually return to your regular diet.  Drink enough fluid to keep your urine pale yellow.  Return to your normal activities as told by your health care provider. Ask your health care provider what activities are safe for you. This information is not   intended to replace advice given to you by your health care provider. Make sure you discuss any questions you have with your health care provider. Document Revised: 08/04/2017 Document Reviewed: 03/17/2017 Elsevier Patient Education  2020 Elsevier Inc[employer]  

## 2020-03-23 ENCOUNTER — Encounter: Payer: Self-pay | Admitting: Gastroenterology

## 2020-03-23 ENCOUNTER — Ambulatory Visit: Payer: Managed Care, Other (non HMO) | Admitting: Anesthesiology

## 2020-03-23 ENCOUNTER — Encounter
Admission: RE | Disposition: A | Discharge status: Home / Self Care | Payer: Self-pay | Source: Home / Self Care | Attending: Gastroenterology

## 2020-03-23 ENCOUNTER — Other Ambulatory Visit: Payer: Self-pay | Admitting: Gastroenterology

## 2020-03-23 ENCOUNTER — Ambulatory Visit
Admission: RE | Admit: 2020-03-23 | Discharge: 2020-03-23 | Disposition: A | Discharge status: Home / Self Care | Payer: Managed Care, Other (non HMO) | Attending: Gastroenterology | Admitting: Gastroenterology

## 2020-03-23 ENCOUNTER — Other Ambulatory Visit: Payer: Self-pay

## 2020-03-23 DIAGNOSIS — F329 Major depressive disorder, single episode, unspecified: Secondary | ICD-10-CM | POA: Insufficient documentation

## 2020-03-23 DIAGNOSIS — D123 Benign neoplasm of transverse colon: Secondary | ICD-10-CM | POA: Diagnosis not present

## 2020-03-23 DIAGNOSIS — M419 Scoliosis, unspecified: Secondary | ICD-10-CM | POA: Diagnosis not present

## 2020-03-23 DIAGNOSIS — K635 Polyp of colon: Secondary | ICD-10-CM

## 2020-03-23 DIAGNOSIS — D120 Benign neoplasm of cecum: Secondary | ICD-10-CM | POA: Diagnosis not present

## 2020-03-23 DIAGNOSIS — D124 Benign neoplasm of descending colon: Secondary | ICD-10-CM | POA: Diagnosis not present

## 2020-03-23 DIAGNOSIS — Z90710 Acquired absence of both cervix and uterus: Secondary | ICD-10-CM | POA: Diagnosis not present

## 2020-03-23 DIAGNOSIS — I10 Essential (primary) hypertension: Secondary | ICD-10-CM | POA: Diagnosis not present

## 2020-03-23 DIAGNOSIS — Z801 Family history of malignant neoplasm of trachea, bronchus and lung: Secondary | ICD-10-CM | POA: Insufficient documentation

## 2020-03-23 DIAGNOSIS — M1990 Unspecified osteoarthritis, unspecified site: Secondary | ICD-10-CM | POA: Insufficient documentation

## 2020-03-23 DIAGNOSIS — Z79899 Other long term (current) drug therapy: Secondary | ICD-10-CM | POA: Diagnosis not present

## 2020-03-23 DIAGNOSIS — Z86718 Personal history of other venous thrombosis and embolism: Secondary | ICD-10-CM | POA: Diagnosis not present

## 2020-03-23 DIAGNOSIS — Z1211 Encounter for screening for malignant neoplasm of colon: Secondary | ICD-10-CM

## 2020-03-23 DIAGNOSIS — D125 Benign neoplasm of sigmoid colon: Secondary | ICD-10-CM | POA: Insufficient documentation

## 2020-03-23 DIAGNOSIS — Z8261 Family history of arthritis: Secondary | ICD-10-CM | POA: Diagnosis not present

## 2020-03-23 DIAGNOSIS — Z7901 Long term (current) use of anticoagulants: Secondary | ICD-10-CM | POA: Diagnosis not present

## 2020-03-23 DIAGNOSIS — Z96642 Presence of left artificial hip joint: Secondary | ICD-10-CM | POA: Insufficient documentation

## 2020-03-23 DIAGNOSIS — K648 Other hemorrhoids: Secondary | ICD-10-CM | POA: Diagnosis not present

## 2020-03-23 DIAGNOSIS — D122 Benign neoplasm of ascending colon: Secondary | ICD-10-CM | POA: Diagnosis not present

## 2020-03-23 DIAGNOSIS — K219 Gastro-esophageal reflux disease without esophagitis: Secondary | ICD-10-CM | POA: Insufficient documentation

## 2020-03-23 HISTORY — DX: Other specified postprocedural states: Z98.890

## 2020-03-23 HISTORY — DX: Nausea with vomiting, unspecified: R11.2

## 2020-03-23 HISTORY — DX: Unspecified osteoarthritis, unspecified site: M19.90

## 2020-03-23 HISTORY — PX: COLONOSCOPY WITH PROPOFOL: SHX5780

## 2020-03-23 HISTORY — DX: Scoliosis, unspecified: M41.9

## 2020-03-23 HISTORY — PX: POLYPECTOMY: SHX5525

## 2020-03-23 SURGERY — COLONOSCOPY WITH PROPOFOL
Anesthesia: General | Site: Rectum

## 2020-03-23 MED ORDER — ONDANSETRON HCL 4 MG/2ML IJ SOLN
4.0000 mg | Freq: Once | INTRAMUSCULAR | Status: AC | PRN
  Administered 2020-03-23: 4 mg via INTRAVENOUS

## 2020-03-23 MED ORDER — LACTATED RINGERS IV SOLN: INTRAVENOUS | Status: DC

## 2020-03-23 MED ORDER — PROPOFOL 10 MG/ML IV BOLUS
INTRAVENOUS | Status: DC | PRN
  Administered 2020-03-23 (×2): 30 mg via INTRAVENOUS
  Administered 2020-03-23: 130 mg via INTRAVENOUS
  Administered 2020-03-23 (×5): 30 mg via INTRAVENOUS

## 2020-03-23 MED ORDER — ACETAMINOPHEN 10 MG/ML IV SOLN: 1000.0000 mg | Freq: Once | INTRAVENOUS | Status: DC | PRN

## 2020-03-23 MED ORDER — STERILE WATER FOR IRRIGATION IR SOLN
Status: DC | PRN
  Administered 2020-03-23: 100 mL

## 2020-03-23 MED ORDER — LIDOCAINE HCL (CARDIAC) PF 100 MG/5ML IV SOSY
PREFILLED_SYRINGE | INTRAVENOUS | Status: DC | PRN
  Administered 2020-03-23: 30 mg via INTRAVENOUS

## 2020-03-23 SURGICAL SUPPLY — 24 items
CLIP HMST 235XBRD CATH ROT (MISCELLANEOUS) IMPLANT
CLIP RESOLUTION 360 11X235 (MISCELLANEOUS)
ELECT REM PT RETURN 9FT ADLT (ELECTROSURGICAL)
ELECTRODE REM PT RTRN 9FT ADLT (ELECTROSURGICAL) IMPLANT
FCP ESCP3.2XJMB 240X2.8X (MISCELLANEOUS)
FORCEPS BIOP RAD 4 LRG CAP 4 (CUTTING FORCEPS) IMPLANT
FORCEPS BIOP RJ4 240 W/NDL (MISCELLANEOUS)
FORCEPS ESCP3.2XJMB 240X2.8X (MISCELLANEOUS) IMPLANT
GOWN CVR UNV OPN BCK APRN NK (MISCELLANEOUS) ×4 IMPLANT
GOWN ISOL THUMB LOOP REG UNIV (MISCELLANEOUS) ×6
INJECTOR VARIJECT VIN23 (MISCELLANEOUS) IMPLANT
KIT DEFENDO VALVE AND CONN (KITS) IMPLANT
KIT ENDO PROCEDURE OLY (KITS) ×3 IMPLANT
MANIFOLD NEPTUNE II (INSTRUMENTS) ×3 IMPLANT
MARKER SPOT ENDO TATTOO 5ML (MISCELLANEOUS) IMPLANT
PROBE APC STR FIRE (PROBE) IMPLANT
RETRIEVER NET ROTH 2.5X230 LF (MISCELLANEOUS) IMPLANT
SNARE SHORT THROW 13M SML OVAL (MISCELLANEOUS) ×3 IMPLANT
SNARE SHORT THROW 30M LRG OVAL (MISCELLANEOUS) IMPLANT
SNARE SNG USE RND 15MM (INSTRUMENTS) IMPLANT
SPOT EX ENDOSCOPIC TATTOO (MISCELLANEOUS)
TRAP ETRAP POLY (MISCELLANEOUS) ×3 IMPLANT
VARIJECT INJECTOR VIN23 (MISCELLANEOUS)
WATER STERILE IRR 250ML POUR (IV SOLUTION) ×3 IMPLANT

## 2020-03-23 NOTE — H&P (Signed)
Lucilla Lame, MD [facility] [address] [place], [place] [address] Phone: [phone] Fax : [phone]  Primary Care Physician:  Mar Daring, PA-C Primary Gastroenterologist:  Dr. Allen Norris  Pre-Procedure History & Physical: HPI:  Kristi Lee is a 64 y.o. female is here for a screening colonoscopy.   Past Medical History:  Diagnosis Date  . Arthritis    back  . Depression   . DVT (deep venous thrombosis) ([place]) 01/25/2020  . GERD (gastroesophageal reflux disease)   . Hypertension   . PONV (postoperative nausea and vomiting)   . Scoliosis    reports she has rods in her back    Past Surgical History:  Procedure Laterality Date  . ABDOMINAL HYSTERECTOMY    . BACK SURGERY  10/2000  . BREAST EXCISIONAL BIOPSY Right 1990's   NEG  . FEMUR FRACTURE SURGERY Right 07/15/2009   Pertrochanteric femur fracture.  [name] HIP FRACTURE SURGERY Right   . PERIPHERAL VASCULAR THROMBECTOMY Left 11/11/2019   Procedure: PERIPHERAL VASCULAR THROMBECTOMY;  Surgeon: Algernon Huxley, MD;  Location: [facility];  Service: Cardiovascular;  Laterality: Left;  . REVISION TOTAL HIP ARTHROPLASTY Left     Prior to Admission medications   Medication Sig Start Date End Date Taking? Authorizing Provider  atenolol (TENORMIN) 25 MG tablet Take 1 tablet (25 mg total) by mouth daily. 10/31/19  Yes Carles Collet M, PA-C  betamethasone dipropionate 0.05 % cream Apply 1 application topically as needed.   Yes [provider]  Biotin 1 MG CAPS  12/09/19  Yes [provider]  carbamazepine (TEGRETOL XR) 100 MG 12 hr tablet  11/28/19  Yes [provider]  furosemide (LASIX) 40 MG tablet Take 1 tablet (40 mg total) by mouth daily. 03/04/20  Yes Agbor-Etang, Aaron Edelman, MD  gabapentin (NEURONTIN) 400 MG capsule Take 400 mg by mouth 3 (three) times daily. 12/31/19  Yes [provider]  metoprolol tartrate (LOPRESSOR) 100 MG tablet Take 1 tablet (100 mg) by mouth 2 hours  prior to your Cardiac CT scan 03/06/20  Yes Agbor-Etang, Aaron Edelman, MD  omeprazole (PRILOSEC) 20 MG capsule TAKE 1 CAPSULE BY MOUTH  DAILY 09/20/19  Yes Fenton Malling M, PA-C  potassium chloride SA (KLOR-CON) 20 MEQ tablet Take 1 tablet (20 mEq total) by mouth daily. 03/12/20  Yes Mar Daring, PA-C  tolterodine (DETROL) 2 MG tablet TAKE 1 TABLET BY MOUTH  TWICE DAILY 04/15/19  Yes Mar Daring, PA-C  traZODone (DESYREL) 100 MG tablet Take 100 mg by mouth at bedtime.   Yes [provider]  triamterene-hydrochlorothiazide (MAXZIDE-25) 37.5-25 MG tablet TAKE 1 TABLET BY MOUTH  DAILY 09/20/19  Yes Mar Daring, PA-C  venlafaxine XR (EFFEXOR-XR) 75 MG 24 hr capsule Take 1 capsule (75 mg total) by mouth daily. 10/31/19  Yes Trinna Post, PA-C  XARELTO 20 MG TABS tablet TAKE 1 TABLET BY MOUTH  DAILY WITH SUPPER 02/19/20 02/18/21 Yes Sindy Guadeloupe, MD  fexofenadine (ALLEGRA) 180 MG tablet Take 180 mg by mouth daily. Patient not taking: Reported on 03/17/2020    [provider]  fluticasone (FLONASE) 50 MCG/ACT nasal spray Place 2 sprays into both nostrils daily. Patient not taking: Reported on 03/17/2020 01/27/20   Mar Daring, PA-C  hydrOXYzine (ATARAX/VISTARIL) 10 MG tablet Take 1 tablet (10 mg total) by mouth 3 (three) times daily as needed. Patient not taking: Reported on 03/17/2020 01/29/20   Mar Daring, PA-C  rivaroxaban (XARELTO) 20 MG TABS  tablet Take 1 tablet (20 mg total) by mouth daily with supper. 11/18/19   Sindy Guadeloupe, MD    Allergies as of 02/19/2020  . (No Known Allergies)    Family History  Problem Relation Age of Onset  . Osteoarthritis Mother   . Lung cancer Father   . Diabetes Brother   . Breast cancer Neg Hx     Social History   Socioeconomic History  . Marital status: Married    Spouse name: Not on file  . Number of children: 1  . Years of education: HS  . Highest education level: Not on file  Occupational History    . Occupation: Industrial/product designer: [employer]  Tobacco Use  . Smoking status: Never Smoker  . Smokeless tobacco: Never Used  Vaping Use  . Vaping Use: Never used  Substance and Sexual Activity  . Alcohol use: No  . Drug use: No  . Sexual activity: Not on file  Other Topics Concern  . Not on file  Social History Narrative   Lives at home in private residence with husband   Social Determinants of Health   Financial Resource Strain:   . Difficulty of Paying Living Expenses:   Food Insecurity:   . Worried About Charity fundraiser in the Last Year:   . Arboriculturist in the Last Year:   Transportation Needs:   . Film/video editor (Medical):   [name] Lack of Transportation (Non-Medical):   Physical Activity:   . Days of Exercise per Week:   . Minutes of Exercise per Session:   Stress:   . Feeling of Stress :   Social Connections:   . Frequency of Communication with Friends and Family:   . Frequency of Social Gatherings with Friends and Family:   . Attends Religious Services:   . Active Member of Clubs or Organizations:   . Attends Archivist Meetings:   [name] Marital Status:   Intimate Partner Violence:   . Fear of Current or Ex-Partner:   . Emotionally Abused:   [name] Physically Abused:   . Sexually Abused:     Review of Systems: See HPI, otherwise negative ROS  Physical Exam: BP (!) 164/95   Pulse 71   Temp (!) 97.5 F (36.4 C)   Ht 5\' 6"  (1.676 m)   Wt 80.7 kg   SpO2 100%   BMI 28.73 kg/m  General:   Alert,  pleasant and cooperative in NAD Head:  Normocephalic and atraumatic. Neck:  Supple; no masses or thyromegaly. Lungs:  Clear throughout to auscultation.    Heart:  Regular rate and rhythm. Abdomen:  Soft, nontender and nondistended. Normal bowel sounds, without guarding, and without rebound.   Neurologic:  Alert and  oriented x4;  grossly normal neurologically.  Impression/Plan: Kristi Lee is now here to undergo a screening  colonoscopy.  Risks, benefits, and alternatives regarding colonoscopy have been reviewed with the patient.  Questions have been answered.  All parties agreeable.

## 2020-03-23 NOTE — Op Note (Signed)
[facility] Gastroenterology Patient Name: Kristi Lee Procedure Date: 03/23/2020 8:22 AM MRN: [id number] Account #: [id number] Date of Birth: 21-Aug-1955 Admit Type: Outpatient Age: 64 Room: [facility] OR ROOM 01 Gender: Female Note Status: Finalized Procedure:             Colonoscopy Indications:           Screening for colorectal malignant neoplasm Providers:             Lucilla Lame MD, MD Referring MD:          Mar Daring (Referring MD) Medicines:             Propofol per Anesthesia Complications:         No immediate complications. Procedure:             Pre-Anesthesia Assessment:                        - Prior to the procedure, a History and Physical was                         performed, and patient medications and allergies were                         reviewed. The patient's tolerance of previous                         anesthesia was also reviewed. The risks and benefits                         of the procedure and the sedation options and risks                         were discussed with the patient. All questions were                         answered, and informed consent was obtained. Prior                         Anticoagulants: The patient has taken no previous                         anticoagulant or antiplatelet agents. ASA Grade                         Assessment: II - A patient with mild systemic disease.                         After reviewing the risks and benefits, the patient                         was deemed in satisfactory condition to undergo the                         procedure.                        After obtaining informed consent, the colonoscope was  passed under direct vision. Throughout the procedure,                         the patient's blood pressure, pulse, and oxygen                         saturations were monitored continuously. The                         Colonoscope was introduced through  the anus and                         advanced to the the cecum, identified by appendiceal                         orifice and ileocecal valve. The colonoscopy was                         performed without difficulty. The patient tolerated                         the procedure well. The quality of the bowel                         preparation was excellent. Findings:      The perianal and digital rectal examinations were normal.      A 6 mm polyp was found in the cecum. The polyp was sessile. The polyp       was removed with a cold snare. Resection and retrieval were complete.      A 5 mm polyp was found in the ascending colon. The polyp was sessile.       The polyp was removed with a cold snare. Resection and retrieval were       complete.      Three sessile polyps were found in the transverse colon. The polyps were       5 to 8 mm in size. These polyps were removed with a cold snare.       Resection and retrieval were complete.      Three sessile polyps were found in the descending colon. The polyps were       2 to 6 mm in size. These polyps were removed with a cold snare.       Resection and retrieval were complete.      A 6 mm polyp was found in the sigmoid colon. The polyp was sessile. The       polyp was removed with a cold snare. Resection and retrieval were       complete.      Non-bleeding internal hemorrhoids were found during retroflexion. The       hemorrhoids were Grade I (internal hemorrhoids that do not prolapse). Impression:            - One 6 mm polyp in the cecum, removed with a cold                         snare. Resected and retrieved.                        - One 5 mm polyp in the ascending colon, removed  with                         a cold snare. Resected and retrieved.                        - Three 5 to 8 mm polyps in the transverse colon,                         removed with a cold snare. Resected and retrieved.                        - Three 2 to 6 mm polyps in  the descending colon,                         removed with a cold snare. Resected and retrieved.                        - One 6 mm polyp in the sigmoid colon, removed with a                         cold snare. Resected and retrieved.                        - Non-bleeding internal hemorrhoids. Recommendation:        - Discharge patient to home.                        - Resume previous diet.                        - Continue present medications.                        - Await pathology results.                        - Repeat colonoscopy in 3 years if polyp adenoma and                         10 years if hyperplastic Procedure Code(s):     --- Professional ---                        [id number], Colonoscopy, flexible; with removal of                         tumor(s), polyp(s), or other lesion(s) by snare                         technique Diagnosis Code(s):     --- Professional ---                        Z12.11, Encounter for screening for malignant neoplasm                         of colon                        K63.5, Polyp of colon CPT copyright 2019 American  Medical Association. All rights reserved. The codes documented in this report are preliminary and upon coder review may  be revised to meet current compliance requirements. Lucilla Lame MD, MD 03/23/2020 8:58:04 AM This report has been signed electronically. Number of Addenda: 0 Note Initiated On: 03/23/2020 8:22 AM Scope Withdrawal Time: 0 hours 15 minutes 0 seconds  Total Procedure Duration: 0 hours 17 minutes 45 seconds  Estimated Blood Loss:  Estimated blood loss: none.      [facility]

## 2020-03-23 NOTE — Anesthesia Procedure Notes (Signed)
Date/Time: 03/23/2020 8:33 AM Performed by: Cameron Ali, CRNA Pre-anesthesia Checklist: Patient identified, Emergency Drugs available, Suction available, Timeout performed and Patient being monitored Patient Re-evaluated:Patient Re-evaluated prior to induction Oxygen Delivery Method: Nasal cannula Placement Confirmation: positive ETCO2

## 2020-03-23 NOTE — Anesthesia Postprocedure Evaluation (Signed)
Anesthesia Post Note  Patient: Kristi Lee  Procedure(s) Performed: COLONOSCOPY WITH PROPOFOL (N/A Rectum) POLYPECTOMY (Rectum)     Patient location during evaluation: PACU Anesthesia Type: General Level of consciousness: awake and alert Pain management: pain level controlled Vital Signs Assessment: post-procedure vital signs reviewed and stable Respiratory status: spontaneous breathing, nonlabored ventilation, respiratory function stable and patient connected to nasal cannula oxygen Cardiovascular status: blood pressure returned to baseline and stable Postop Assessment: no apparent nausea or vomiting Anesthetic complications: no   No complications documented.  Alysen Smylie A  Jameson Morrow

## 2020-03-23 NOTE — Anesthesia Preprocedure Evaluation (Signed)
Anesthesia Evaluation  Patient identified by MRN, date of birth, ID band Patient awake    Reviewed: Allergy & Precautions, NPO status , Patient's Chart, lab work & pertinent test results, reviewed documented beta blocker date and time   History of Anesthesia Complications (+) PONV and history of anesthetic complications  Airway Mallampati: III  TM Distance: >3 FB Neck ROM: Full    Dental  (+)    Pulmonary    breath sounds clear to auscultation       Cardiovascular Exercise Tolerance: Poor hypertension, (-) angina+ DOE (Chronic, stable)   Rhythm:Regular Rate:Normal   HLD   Neuro/Psych PSYCHIATRIC DISORDERS Depression    GI/Hepatic GERD  Controlled,  Endo/Other    Renal/GU      Musculoskeletal  (+) Arthritis ,  Scoliosis   Abdominal   Peds  Hematology  (+) Blood dyscrasia (H/o DVT, on Xarelto), ,   Anesthesia Other Findings   Reproductive/Obstetrics                             Anesthesia Physical Anesthesia Plan  ASA: III  Anesthesia Plan: General   Post-op Pain Management:    Induction: Intravenous  PONV Risk Score and Plan: 4 or greater and Propofol infusion, TIVA, Treatment may vary due to age or medical condition and Ondansetron  Airway Management Planned: Natural Airway and Nasal Cannula  Additional Equipment:   Intra-op Plan:   Post-operative Plan:   Informed Consent: I have reviewed the patients History and Physical, chart, labs and discussed the procedure including the risks, benefits and alternatives for the proposed anesthesia with the patient or authorized representative who has indicated his/her understanding and acceptance.       Plan Discussed with: CRNA and Anesthesiologist  Anesthesia Plan Comments:         Anesthesia Quick Evaluation

## 2020-03-23 NOTE — Transfer of Care (Signed)
Immediate Anesthesia Transfer of Care Note  Patient: Kristi Lee  Procedure(s) Performed: COLONOSCOPY WITH PROPOFOL (N/A Rectum) POLYPECTOMY (Rectum)  Patient Location: PACU  Anesthesia Type: General  Level of Consciousness: awake, alert  and patient cooperative  Airway and Oxygen Therapy: Patient Spontanous Breathing and Patient connected to supplemental oxygen  Post-op Assessment: Post-op Vital signs reviewed, Patient's Cardiovascular Status Stable, Respiratory Function Stable, Patent Airway and No signs of Nausea or vomiting  Post-op Vital Signs: Reviewed and stable  Complications: No complications documented.

## 2020-03-24 ENCOUNTER — Encounter: Payer: Self-pay | Admitting: Gastroenterology

## 2020-03-25 ENCOUNTER — Telehealth (HOSPITAL_COMMUNITY): Payer: Self-pay | Admitting: Emergency Medicine

## 2020-03-25 ENCOUNTER — Telehealth: Payer: Self-pay | Admitting: Cardiology

## 2020-03-25 LAB — SURGICAL PATHOLOGY

## 2020-03-25 NOTE — Telephone Encounter (Signed)
Reaching out to patient to offer assistance regarding upcoming cardiac imaging study; pt verbalizes understanding of appt date/time, parking situation and where to check in, pre-test NPO status and medications ordered, and verified current allergies; name and call back number provided for further questions should they arise Navah Grondin RN Navigator Cardiac Imaging [facility] [phone] office [phone] cell 

## 2020-03-25 NOTE — Telephone Encounter (Signed)
Spoke with patient and she stated that the pharmacy did not have the Lopressor when she went to pick it up. I called and gave a verbal order to the pharmacist, and patient will be picking it up this afternoon.

## 2020-03-25 NOTE — Telephone Encounter (Signed)
Patient scheduled for CT tomorrow but her pharmacy does not have the medication she needs to take prior, metoprolol tartrate 100 mg. Patient states the first pharmacy was [facility] and she also called [facility], they did not have it.  Patient is willing to go to another location but wanted to let the office know because she is not sure where to call or have a prescription sent to  Please advise

## 2020-03-26 ENCOUNTER — Encounter: Payer: Self-pay | Admitting: Gastroenterology

## 2020-03-26 ENCOUNTER — Other Ambulatory Visit: Payer: Self-pay

## 2020-03-26 ENCOUNTER — Ambulatory Visit
Admission: RE | Admit: 2020-03-26 | Discharge: 2020-03-26 | Disposition: A | Discharge status: Home / Self Care | Payer: Managed Care, Other (non HMO) | Source: Ambulatory Visit | Attending: Cardiology | Admitting: Cardiology

## 2020-03-26 DIAGNOSIS — R0609 Other forms of dyspnea: Secondary | ICD-10-CM

## 2020-03-26 DIAGNOSIS — R0600 Dyspnea, unspecified: Secondary | ICD-10-CM | POA: Diagnosis not present

## 2020-03-26 LAB — PATHOLOGY

## 2020-03-26 LAB — POCT I-STAT CREATININE: Creatinine, Ser: 1.2 mg/dL — ABNORMAL HIGH (ref 0.44–1.00)

## 2020-03-26 MED ORDER — DILTIAZEM HCL 25 MG/5ML IV SOLN
10.0000 mg | Freq: Once | INTRAVENOUS | Status: AC
  Administered 2020-03-26: 10 mg via INTRAVENOUS

## 2020-03-26 MED ORDER — METOPROLOL TARTRATE 5 MG/5ML IV SOLN
10.0000 mg | Freq: Once | INTRAVENOUS | Status: AC
  Administered 2020-03-26: 10 mg via INTRAVENOUS

## 2020-03-26 MED ORDER — NITROGLYCERIN 0.4 MG SL SUBL
0.8000 mg | SUBLINGUAL_TABLET | Freq: Once | SUBLINGUAL | Status: AC
  Administered 2020-03-26: 0.8 mg via SUBLINGUAL

## 2020-03-26 MED ORDER — IOHEXOL 350 MG/ML SOLN
85.0000 mL | Freq: Once | INTRAVENOUS | Status: AC | PRN
  Administered 2020-03-26: 85 mL via INTRAVENOUS

## 2020-03-26 NOTE — Progress Notes (Signed)
Patient tolerated procedure well. Ambulate w/o difficulty. Sitting in chair drinking. No needs. All questions answered. ABC intact. Discharge from procedure area w/o issues.

## 2020-03-27 ENCOUNTER — Telehealth: Payer: Self-pay | Admitting: Cardiology

## 2020-03-27 NOTE — Telephone Encounter (Signed)
Kate Sable, MD  03/26/2020 5:16 PM EDT     Normal coronary CTA, no evidence for CAD

## 2020-03-27 NOTE — Telephone Encounter (Signed)
Left voicemail message to call back  

## 2020-03-27 NOTE — Telephone Encounter (Signed)
Patient returning call   Also notified she can see these results on mychart while waiting on a call back

## 2020-03-27 NOTE — Telephone Encounter (Signed)
Attempted to call the patient. No answer- I left a message to please call back. Phone note started.

## 2020-03-30 NOTE — Telephone Encounter (Signed)
Patient has viewed result in Mychart

## 2020-03-31 NOTE — Telephone Encounter (Signed)
Spoke with patient and gave CTA results.

## 2020-04-01 ENCOUNTER — Other Ambulatory Visit: Payer: Self-pay

## 2020-04-01 MED ORDER — FUROSEMIDE 40 MG PO TABS: 40.0000 mg | ORAL_TABLET | Freq: Every day | ORAL | 3 refills | Status: DC

## 2020-04-03 ENCOUNTER — Other Ambulatory Visit: Payer: Self-pay

## 2020-04-03 MED ORDER — FUROSEMIDE 40 MG PO TABS: 40.0000 mg | ORAL_TABLET | Freq: Every day | ORAL | 3 refills | Status: DC

## 2020-05-01 ENCOUNTER — Other Ambulatory Visit: Payer: Self-pay

## 2020-05-01 ENCOUNTER — Ambulatory Visit: Payer: Managed Care, Other (non HMO) | Admitting: Cardiology

## 2020-05-01 ENCOUNTER — Encounter: Payer: Self-pay | Admitting: Cardiology

## 2020-05-01 VITALS — BP 180/80 | HR 84 | Ht 66.0 in | Wt 192.0 lb

## 2020-05-01 DIAGNOSIS — R0600 Dyspnea, unspecified: Secondary | ICD-10-CM | POA: Diagnosis not present

## 2020-05-01 DIAGNOSIS — I10 Essential (primary) hypertension: Secondary | ICD-10-CM | POA: Diagnosis not present

## 2020-05-01 DIAGNOSIS — R0609 Other forms of dyspnea: Secondary | ICD-10-CM

## 2020-05-01 DIAGNOSIS — I82402 Acute embolism and thrombosis of unspecified deep veins of left lower extremity: Secondary | ICD-10-CM
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
## 2020-05-01 NOTE — Patient Instructions (Signed)

## 2020-05-13 ENCOUNTER — Other Ambulatory Visit: Payer: Self-pay | Admitting: Physician Assistant

## 2020-05-13 ENCOUNTER — Ambulatory Visit (INDEPENDENT_AMBULATORY_CARE_PROVIDER_SITE_OTHER): Payer: Managed Care, Other (non HMO) | Admitting: Physician Assistant

## 2020-05-13 ENCOUNTER — Other Ambulatory Visit: Payer: Self-pay

## 2020-05-13 ENCOUNTER — Encounter: Payer: Self-pay | Admitting: Physician Assistant

## 2020-05-13 VITALS — BP 137/65 | HR 84 | Temp 99.2°F | Ht 66.0 in | Wt 190.0 lb

## 2020-05-13 DIAGNOSIS — E6609 Other obesity due to excess calories: Secondary | ICD-10-CM

## 2020-05-13 DIAGNOSIS — Z6830 Body mass index (BMI) 30.0-30.9, adult: Secondary | ICD-10-CM | POA: Diagnosis not present

## 2020-05-13 DIAGNOSIS — J9811 Atelectasis: Secondary | ICD-10-CM

## 2020-05-13 DIAGNOSIS — I10 Essential (primary) hypertension: Secondary | ICD-10-CM

## 2020-05-13 DIAGNOSIS — R350 Frequency of micturition: Secondary | ICD-10-CM

## 2020-05-13 MED ORDER — PHENTERMINE HCL 30 MG PO CAPS: 30.0000 mg | ORAL_CAPSULE | ORAL | 2 refills | Status: DC

## 2020-05-13 MED ORDER — BREO ELLIPTA 100-25 MCG/INH IN AEPB: 1.0000 | INHALATION_SPRAY | Freq: Every day | RESPIRATORY_TRACT | 5 refills | Status: DC

## 2020-05-13 MED ORDER — FUROSEMIDE 40 MG PO TABS: 40.0000 mg | ORAL_TABLET | Freq: Every day | ORAL | 3 refills | Status: DC

## 2020-05-13 NOTE — Progress Notes (Signed)
Established patient visit   Patient: Kristi Lee   DOB: 05-12-1956   64 y.o. Female  MRN: [id number] Visit Date: 05/13/2020  Today's healthcare provider: Mar Daring, PA-C   Chief Complaint  Patient presents with  . Shortness of Breath   Subjective    Shortness of Breath This is a chronic problem. The current episode started more than 1 year ago. The problem occurs constantly. The problem has been unchanged. Pertinent negatives include no headaches or wheezing. Exacerbated by: Walking.     Patient Active Problem List   Diagnosis Date Noted  . Special screening for malignant neoplasms, colon   . Polyp of transverse colon   . DVT (deep venous thrombosis) ([place]) 11/08/2019  . Burning sensation 10/17/2019  . Itchy scalp 10/17/2019  . Occipital neuralgia of right side 10/17/2019  . Tingling 10/17/2019  . Hyperlipidemia, mixed 10/10/2016  . Stable angina pectoris ([place]) 10/10/2016  . Adaptation reaction 01/27/2015  . Back pain, chronic 01/27/2015  . CN (constipation) 01/27/2015  . Abnormal LFTs 01/27/2015  . Benign essential HTN 01/27/2015  . Gastro-esophageal reflux disease without esophagitis 01/27/2015  . Personal history of arthritis 01/27/2015  . Insomnia 01/27/2015  . Scoliosis 01/27/2015  . FOM (frequency of micturition) 01/27/2015   Past Medical History:  Diagnosis Date  . Arthritis    back  . Depression   . DVT (deep venous thrombosis) ([place]) 01/25/2020  . GERD (gastroesophageal reflux disease)   . Hypertension   . PONV (postoperative nausea and vomiting)   . Scoliosis    reports she has rods in her back       Medications: Outpatient Medications Prior to Visit  Medication Sig  . atenolol (TENORMIN) 25 MG tablet Take 1 tablet (25 mg total) by mouth daily.  . betamethasone dipropionate 0.05 % cream Apply 1 application topically as needed.   . Biotin 1 MG CAPS   . carbamazepine (TEGRETOL XR) 100 MG 12 hr tablet   . fexofenadine (ALLEGRA)  180 MG tablet Take 180 mg by mouth daily.   . fluticasone (FLONASE) 50 MCG/ACT nasal spray Place 2 sprays into both nostrils daily.  . furosemide (LASIX) 40 MG tablet Take 1 tablet (40 mg total) by mouth daily.  [name] gabapentin (NEURONTIN) 400 MG capsule Take 400 mg by mouth 3 (three) times daily.  . hydrOXYzine (ATARAX/VISTARIL) 10 MG tablet Take 1 tablet (10 mg total) by mouth 3 (three) times daily as needed.  . metoprolol tartrate (LOPRESSOR) 100 MG tablet Take 1 tablet (100 mg) by mouth 2 hours prior to your Cardiac CT scan  . omeprazole (PRILOSEC) 20 MG capsule TAKE 1 CAPSULE BY MOUTH  DAILY  . potassium chloride SA (KLOR-CON) 20 MEQ tablet Take 1 tablet (20 mEq total) by mouth daily.  [name] tolterodine (DETROL) 2 MG tablet TAKE 1 TABLET BY MOUTH  TWICE DAILY  . traZODone (DESYREL) 100 MG tablet TAKE 1 TABLET BY MOUTH AT  BEDTIME  . triamterene-hydrochlorothiazide (MAXZIDE-25) 37.5-25 MG tablet TAKE 1 TABLET BY MOUTH  DAILY  . venlafaxine XR (EFFEXOR-XR) 75 MG 24 hr capsule Take 1 capsule (75 mg total) by mouth daily.  Alveda Reasons 20 MG TABS tablet TAKE 1 TABLET BY MOUTH  DAILY WITH SUPPER   No facility-administered medications prior to visit.    Review of Systems  Constitutional: Negative.   Respiratory: Positive for shortness of breath. Negative for apnea, cough, choking, chest tightness, wheezing and stridor.   Cardiovascular: Negative.   Gastrointestinal: Negative.  Neurological: Negative for dizziness, light-headedness and headaches.    Last CBC Lab Results  Component Value Date   WBC 12.4 (H) 01/24/2020   HGB 10.4 (L) 01/24/2020   HCT 33.4 (L) 01/24/2020   MCV 80.7 01/24/2020   MCH 25.1 (L) 01/24/2020   RDW 17.2 (H) 01/24/2020   PLT 239 61/60/7371   Last metabolic panel Lab Results  Component Value Date   GLUCOSE 100 (H) 02/03/2020   NA 142 02/03/2020   K 4.1 02/03/2020   CL 103 02/03/2020   CO2 26 02/03/2020   BUN 22 02/03/2020   CREATININE 1.20 (H) 03/26/2020    GFRNONAA 64 02/03/2020   GFRAA 74 02/03/2020   CALCIUM 8.8 02/03/2020   PROT 7.7 01/24/2020   ALBUMIN 4.1 01/24/2020   LABGLOB 2.5 04/26/2019   AGRATIO 1.8 04/26/2019   BILITOT 0.7 01/24/2020   ALKPHOS 173 (H) 01/24/2020   AST 33 01/24/2020   ALT 59 (H) 01/24/2020   ANIONGAP 12 01/24/2020      Objective    BP 137/65 (BP Location: Right Arm, Patient Position: Sitting, Cuff Size: Large)   Pulse 84   Temp 99.2 F (37.3 C) (Oral)   Ht 5\' 6"  (1.676 m)   Wt 190 lb (86.2 kg)   SpO2 99%   BMI 30.67 kg/m  BP Readings from Last 3 Encounters:  05/13/20 137/65  05/01/20 (!) 180/80  03/26/20 106/62   Wt Readings from Last 3 Encounters:  05/13/20 190 lb (86.2 kg)  05/01/20 192 lb (87.1 kg)  03/23/20 178 lb (80.7 kg)      Physical Exam Vitals reviewed.  Constitutional:      General: She is not in acute distress.    Appearance: She is well-developed. She is not diaphoretic.  Cardiovascular:     Rate and Rhythm: Normal rate and regular rhythm.     Heart sounds: Normal heart sounds. No murmur heard.  No friction rub. No gallop.   Pulmonary:     Effort: Pulmonary effort is normal. No respiratory distress.     Breath sounds: Normal breath sounds. No wheezing or rales.  Musculoskeletal:     Cervical back: Normal range of motion and neck supple.       No results found for any visits on 05/13/20.  Assessment & Plan     1. Atelectasis of both lungs Worsening.Will add Breo as below. Patient is to f/u if not improving.  - fluticasone furoate-vilanterol (BREO ELLIPTA) 100-25 MCG/INH AEPB; Inhale 1 puff into the lungs daily.  Dispense: 60 each; Refill: 5  2. Class 1 obesity due to excess calories with serious comorbidity and body mass index (BMI) of 30.0 to 30.9 in adult Patient desires medical assistance with weight loss. Will restart phentermine as below. F/U in 2-3 months if continued therapy desired.  - phentermine 30 MG capsule; Take 1 capsule (30 mg total) by mouth every  morning.  Dispense: 30 capsule; Refill: 2   No follow-ups on file.      Reynolds Bowl, PA-C, have reviewed all documentation for this visit. The documentation on 05/19/20 for the exam, diagnosis, procedures, and orders are all accurate and complete.   Kristi Lee  [facility] [phone] (phone) [phone] (fax)  [place]

## 2020-05-13 NOTE — Patient Instructions (Signed)
Fluticasone; Vilanterol inhalation powder What is this medicine? FLUTICASONE; VILANTEROL (floo TIK a sone; vye LAN ter ol) inhalation is a combination of two medicines that decrease inflammation and help to open up the airways of your lungs. It is for chronic obstructive pulmonary disease (COPD), including chronic bronchitis or emphysema. It is also used for asthma in adults to help control symptoms. Do NOT use for an acute asthma attack or COPD attack. This medicine may be used for other purposes; ask your health care provider or pharmacist if you have questions. COMMON BRAND NAME(S): BREO ELLIPTA What should I tell my health care provider before I take this medicine? They need to know if you have any of these conditions:  bone problems  diabetes  eye disease, vision problems  immune system problems  heart disease or irregular heartbeat  high blood pressure  infection  pheochromocytoma  seizures  thyroid disease  an unusual or allergic reaction to fluticasone, vilanterol, milk proteins, corticosteroids, other medicines, foods, dyes, or preservatives  pregnant or trying to get pregnant  breast-feeding How should I use this medicine? This medicine is inhaled through the mouth. It is used once per day. Follow the directions on the prescription label. Do not use a spacer device with this inhaler. Take your medicine at regular intervals. Do not take your medicine more often than directed. Do not stop taking except on your doctor's advice. Make sure that you are using your inhaler correctly. Ask you doctor or health care provider if you have any questions. A special MedGuide will be given to you by the pharmacist with each prescription and refill. Be sure to read this information carefully each time. Talk to your pediatrician regarding the use of this medicine in children. Special care may be needed. This medicine is not approved for use in children under 63 years of age. Overdosage:  If you think you have taken too much of this medicine contact a poison control center or emergency room at once. NOTE: This medicine is only for you. Do not share this medicine with others. What if I miss a dose? If you miss a dose, use it as soon as you can. If it is almost time for your next dose, use only that dose and continue with your regular schedule. Do not use double or extra doses. What may interact with this medicine? Do not take this medicine with any of the following medications:  cisapride  dofetilide  dronedarone  MAOIs like Carbex, Eldepryl, Marplan, Nardil, and Parnate  pimozide  thioridazine  ziprasidone This medicine may also interact with the following medications:  antiviral medicines for HIV or AIDS  beta-blockers like metoprolol and propranolol  certain medicines for depression, anxiety, or psychotic disturbances  certain medicines for fungal infections like ketoconazole, itraconazole, posaconazole, voriconazole  conivaptan  diuretics  medicines for colds  nefazodone  other medicines for breathing problems  other medicines that prolong the QT interval (cause an abnormal heart rhythm) This list may not describe all possible interactions. Give your health care provider a list of all the medicines, herbs, non-prescription drugs, or dietary supplements you use. Also tell them if you smoke, drink alcohol, or use illegal drugs. Some items may interact with your medicine. What should I watch for while using this medicine? Visit your doctor or health care professional for regular checkups. Tell your doctor or health care professional if your symptoms do not get better. Do not use this medicine more than once every 24 hours. NEVER use  this medicine for an acute asthma or COPD attack. You should use your short-acting rescue inhalers for this purpose. If your symptoms get worse or if you need your short-acting inhalers more often, call your doctor right  away. If you are going to have surgery tell your doctor or health care professional that you are using this medicine. Try not to come in contact with people with the chicken pox or measles. If you do, call your doctor. This medicine may increase blood sugar. Ask your healthcare provider if changes in diet or medicines are needed if you have diabetes. What side effects may I notice from receiving this medicine? Side effects that you should report to your doctor or health care professional as soon as possible:  allergic reactions like skin rash or hives, swelling of the face, lips, or tongue  breathing problems right after inhaling your medicine  chest pain  fast, irregular heartbeat  feeling faint or lightheaded, falls  fever or chills  nausea, vomiting  signs and symptoms of high blood sugar such as being more thirsty or hungry or having to urinate more than normal. You may also feel very tired or have blurry vision. Side effects that usually do not require medical attention (report to your doctor or health care professional if they continue or are bothersome):  cough  headache  nervousness  sore throat  tremor This list may not describe all possible side effects. Call your doctor for medical advice about side effects. You may report side effects to FDA at 1-800-FDA-1088. Where should I keep my medicine? Keep out of the reach of children. Store at room temperature between 15 and 30 degrees C (59 and 86 degrees F). Store in a dry place away from direct heat or sunlight. Throw away 6 weeks after you remove the inhaler from the foil tray, or after the dose indicator reads 0, whichever comes first. Throw away any unopened packages after the expiration date. NOTE: This sheet is a summary. It may not cover all possible information. If you have questions about this medicine, talk to your doctor, pharmacist, or health care provider.  2020 Elsevier/Gold Standard (2018-05-02  11:19:39) Atelectasis, Adult  Atelectasis is a collapse of air sacs in the lungs (alveoli). The condition causes all or part of a lung to collapse. Atelectasis is a common problem after surgery. Its severity depends on the size of lung tissue area involved and the underlying cause. When severe, it can lead to shortness of breath and heart problems. Atelectasis can develop suddenly or over a long period of time. Atelectasis that develops over a long period of time (chronic atelectasis) often leads to infection, scarring, and other problems. What are the causes? This condition may be caused by:  Shallow breathing.  Medicines that make breathing more shallow.  A blockage in an airway. Blockages can result from: ? A buildup of mucus. ? A tumor. ? An inhaled object (foreign body). ? Enlarged lymph nodes. ? Fluid in the lungs (pleural effusion). ? A blood clot in the lungs.  Outside pressure on the lung. Pressure can be due to: ? A tumor. ? Fluid in the lungs (pleural effusion). ? Air leaking between the lung and rib cage (pneumothorax). ? Enlarged lymph nodes.  Improper expansion of the lungs. This may occur in newborns because of: ? Prematurity. ? Low oxygen levels. ? Secretions at birth that block the airway. ? Amniotic fluid that goes into the lungs (aspiration). What increases the risk? This condition is  more likely to develop in people who:  Have an injury or health problem that makes taking deep breaths difficult or painful.  Have certain infections or diseases, such as pneumonia or cystic fibrosis.  Have had surgery on the chest or abdomen.  Have broken ribs.  Have a tight bandage around their chest.  Have a collapsed lung due to pneumothorax.  Take medicines that decrease the rate of their breathing or how deeply they breathe, like sedatives.  Lie flat for long periods of time. What are the signs or symptoms? Often, there are no symptoms for this condition. When  symptoms do appear, they may include:  Shortness of breath.  Bluish color to the nails, lips, or mouth (cyanosis).  A cough. How is this diagnosed? This condition may be diagnosed based on:  Symptoms.  A physical exam.  A chest X-ray. Sometimes specialized imaging tests are needed to diagnose the condition. How is this treated? Treatment for this condition depends on what caused the condition. Treatment may involve:  Coughing. Coughing helps loosen mucus in the airway.  Chest physiotherapy. This is a treatment to help loosen and clear mucus from the airways. It is done by clapping the chest.  Postural drainage techniques. This treatment involves positioning your body so your head is lower than your chest. It helps mucus drain from your airways.  An incentive spirometer. This is a device that is used to help with taking deeper breaths.  Positive pressure breathing. This is a form of breathing assistance in which air is forced into the lungs when you breathe in (inhale). You may have this treatment if your condition is severe.  Treatment of the underlying condition. Follow these instructions at home:  Take over-the-counter and prescription medicines only as told by your health care provider.  Practice taking relaxed and deep breaths when you are sitting. A good time to practice is when you are watching TV. Take a few deep breaths during each commercial break.  Make sure to lie on your unaffected side when you are lying down. For example, if you have atelectasis in your left lung, lie on your right side. This will help mucus drain from your airway.  Cough several times a day as told by your health care provider.  Perform chest physiotherapy or postural drainage techniques as told by your health care provider. If necessary, have someone help you.  If you were given a device to help with breathing, use it as told by your health care provider.  Stay as active as possible. Get  help right away if:  Your breathing problems get worse.  You have severe chest pain.  You develop severe coughing.  You cough up blood.  You have a fever.  You have persistent symptoms for more than 2-3 days.  Your symptoms suddenly get worse. This information is not intended to replace advice given to you by your health care provider. Make sure you discuss any questions you have with your health care provider. Document Revised: 07/14/2017 Document Reviewed: 01/04/2016 Elsevier Patient Education  2020 [employer].

## 2020-05-13 NOTE — Telephone Encounter (Signed)
Patient has appointment with provider today- 4:40. Patient is requesting RF of medications that we could fill with courtesy RF and one that need review- historical provider. Since patient has appointment today- will send to office for provider to review at appointment

## 2020-05-18 ENCOUNTER — Other Ambulatory Visit: Payer: Managed Care, Other (non HMO)

## 2020-05-19 ENCOUNTER — Other Ambulatory Visit: Payer: Managed Care, Other (non HMO)

## 2020-05-26 ENCOUNTER — Ambulatory Visit: Payer: Managed Care, Other (non HMO) | Admitting: Oncology

## 2020-05-29 ENCOUNTER — Telehealth: Payer: Self-pay | Admitting: Physician Assistant

## 2020-05-29 DIAGNOSIS — R0602 Shortness of breath: Secondary | ICD-10-CM

## 2020-05-29 NOTE — Telephone Encounter (Signed)
Patient is calling because she has been using the inhaler prescribed by [facility]. But patient can not tell much difference. Would like Tawanna Sat to place a referral for a lung doctor as discussed. Please advise CB- [phone]

## 2020-06-01 NOTE — Telephone Encounter (Signed)
Referral placed.

## 2020-06-02 ENCOUNTER — Ambulatory Visit (INDEPENDENT_AMBULATORY_CARE_PROVIDER_SITE_OTHER): Payer: Managed Care, Other (non HMO) | Admitting: Nurse Practitioner

## 2020-06-02 ENCOUNTER — Encounter (INDEPENDENT_AMBULATORY_CARE_PROVIDER_SITE_OTHER): Payer: Managed Care, Other (non HMO)

## 2020-06-03 ENCOUNTER — Encounter (INDEPENDENT_AMBULATORY_CARE_PROVIDER_SITE_OTHER): Payer: Self-pay | Admitting: Nurse Practitioner

## 2020-06-03 ENCOUNTER — Ambulatory Visit (INDEPENDENT_AMBULATORY_CARE_PROVIDER_SITE_OTHER): Payer: Managed Care, Other (non HMO)

## 2020-06-03 ENCOUNTER — Other Ambulatory Visit: Payer: Self-pay

## 2020-06-03 ENCOUNTER — Ambulatory Visit (INDEPENDENT_AMBULATORY_CARE_PROVIDER_SITE_OTHER): Payer: Managed Care, Other (non HMO) | Admitting: Nurse Practitioner

## 2020-06-03 VITALS — BP 107/69 | HR 92 | Resp 16 | Wt 181.2 lb

## 2020-06-03 DIAGNOSIS — I10 Essential (primary) hypertension: Secondary | ICD-10-CM

## 2020-06-03 DIAGNOSIS — I82412 Acute embolism and thrombosis of left femoral vein: Secondary | ICD-10-CM

## 2020-06-03 DIAGNOSIS — D6851 Activated protein C resistance: Secondary | ICD-10-CM

## 2020-06-07 ENCOUNTER — Encounter (INDEPENDENT_AMBULATORY_CARE_PROVIDER_SITE_OTHER): Payer: Self-pay | Admitting: Nurse Practitioner

## 2020-06-07 NOTE — Progress Notes (Signed)
Subjective:    Patient ID: Kristi Lee, female    DOB: 06-Apr-1956, 64 y.o.   MRN: [id number] Chief Complaint  Patient presents with  . Follow-up    ultrasound follow up    The patient presents today for follow-up evaluation of her left lower extremity DVT.  Patient had a thrombectomy in the left lower extremity on 11/11/2019.  Patient was subsequently placed on Xarelto for anticoagulation.  In the interim, the patient was diagnosed with factor V heterozygous disorder.  Also since her last office visit the patient has had a DVT in her right lower extremity.  The patient stopped her Eliquis for 2 weeks when she ran out and subsequently had a DVT.  She denies any chest pain or shortness of breath.  Currently she does not have any extensive swelling in her bilateral lower extremities.  The patient has restarted her Xarelto and is currently doing well with it.  Today noninvasive studies show no evidence of an acute process of DVT seen in the left lower extremity.  There is some partial compressibility in the mid femoral vein.   Review of Systems  Respiratory: Negative for chest tightness and shortness of breath.   Cardiovascular: Negative for leg swelling.       Objective:   Physical Exam Vitals reviewed.  HENT:     Head: Normocephalic.  Cardiovascular:     Rate and Rhythm: Normal rate.     Pulses: Normal pulses.  Pulmonary:     Effort: Pulmonary effort is normal.  Musculoskeletal:     Right lower leg: No edema.     Left lower leg: No edema.  Skin:    General: Skin is warm and dry.  Neurological:     Mental Status: She is alert and oriented to person, place, and time.  Psychiatric:        Mood and Affect: Mood normal.        Behavior: Behavior normal.        Thought Content: Thought content normal.        Judgment: Judgment normal.     BP 107/69 (BP Location: Left Arm)   Pulse 92   Resp 16   Wt 181 lb 3.2 oz (82.2 kg)   BMI 29.25 kg/m   Past Medical History:    Diagnosis Date  . Arthritis    back  . Depression   . DVT (deep venous thrombosis) ([place]) 01/25/2020  . GERD (gastroesophageal reflux disease)   . Hypertension   . PONV (postoperative nausea and vomiting)   . Scoliosis    reports she has rods in her back    Social History   Socioeconomic History  . Marital status: Married    Spouse name: Not on file  . Number of children: 1  . Years of education: HS  . Highest education level: Not on file  Occupational History  . Occupation: Industrial/product designer: [employer]  Tobacco Use  . Smoking status: Never Smoker  . Smokeless tobacco: Never Used  Vaping Use  . Vaping Use: Never used  Substance and Sexual Activity  . Alcohol use: No  . Drug use: No  . Sexual activity: Not on file  Other Topics Concern  . Not on file  Social History Narrative   Lives at home in private residence with husband   Social Determinants of Health   Financial Resource Strain:   . Difficulty of Paying Living Expenses: Not on file  Food Insecurity:   .  Worried About Charity fundraiser in the Last Year: Not on file  . Ran Out of Food in the Last Year: Not on file  Transportation Needs:   . Lack of Transportation (Medical): Not on file  . Lack of Transportation (Non-Medical): Not on file  Physical Activity:   . Days of Exercise per Week: Not on file  . Minutes of Exercise per Session: Not on file  Stress:   . Feeling of Stress : Not on file  Social Connections:   . Frequency of Communication with Friends and Family: Not on file  . Frequency of Social Gatherings with Friends and Family: Not on file  . Attends Religious Services: Not on file  . Active Member of Clubs or Organizations: Not on file  . Attends Archivist Meetings: Not on file  . Marital Status: Not on file  Intimate Partner Violence:   . Fear of Current or Ex-Partner: Not on file  . Emotionally Abused: Not on file  . Physically Abused: Not on file  . Sexually  Abused: Not on file    Past Surgical History:  Procedure Laterality Date  . ABDOMINAL HYSTERECTOMY    . BACK SURGERY  10/2000  . BREAST EXCISIONAL BIOPSY Right 1990's   NEG  . COLONOSCOPY WITH PROPOFOL N/A 03/23/2020   Procedure: COLONOSCOPY WITH PROPOFOL;  Surgeon: Lucilla Lame, MD;  Location: [place];  Service: Endoscopy;  Laterality: N/A;  priority 4  . FEMUR FRACTURE SURGERY Right 07/15/2009   Pertrochanteric femur fracture.  [name] HIP FRACTURE SURGERY Right   . PERIPHERAL VASCULAR THROMBECTOMY Left 11/11/2019   Procedure: PERIPHERAL VASCULAR THROMBECTOMY;  Surgeon: Algernon Huxley, MD;  Location: [place] [facility];  Service: Cardiovascular;  Laterality: Left;  . POLYPECTOMY  03/23/2020   Procedure: POLYPECTOMY;  Surgeon: Lucilla Lame, MD;  Location: [place];  Service: Endoscopy;;  . REVISION TOTAL HIP ARTHROPLASTY Left     Family History  Problem Relation Age of Onset  . Osteoarthritis Mother   . Lung cancer Father   . Diabetes Brother   . Breast cancer Neg Hx     No Known Allergies     Assessment & Plan:   1. Acute deep vein thrombosis (DVT) of femoral vein of left lower extremity (HCC) Previous DVT in the left lower extremity is essentially resolved with evidence of chronic thrombus.  Patient will remain on Xarelto at this time.  The patient is advised to utilize medical grade 1 compression stockings on a daily basis but especially when she travels.  She is also advised to elevate her lower extremities when not active.  Activity is also helpful for preventing postphlebitic swelling and pain.  Patient will follow up in 1 year.  2. Benign essential HTN Continue antihypertensive medications as already ordered, these medications have been reviewed and there are no changes at this time.   3. Factor V Leiden ([place]) Because the patient's factor V Leiden disorder, in addition to her DVT in her right lower extremity after stopping her anticoagulation for such a  short time, we will defer to hematology for when she should stop her anticoagulation, if at all.  The patient should have an upcoming appointment in the next couple of months per her hematologist last note.   Current Outpatient Medications on File Prior to Visit  Medication Sig Dispense Refill  . atenolol (TENORMIN) 25 MG tablet Take 1 tablet (25 mg total) by mouth daily. 90 tablet 3  . betamethasone dipropionate  0.05 % cream Apply 1 application topically as needed.     . Biotin 1 MG CAPS     . carbamazepine (TEGRETOL XR) 100 MG 12 hr tablet     . fexofenadine (ALLEGRA) 180 MG tablet Take 180 mg by mouth daily.     . fluticasone (FLONASE) 50 MCG/ACT nasal spray Place 2 sprays into both nostrils daily. 16 g 6  . fluticasone furoate-vilanterol (BREO ELLIPTA) 100-25 MCG/INH AEPB Inhale 1 puff into the lungs daily. 60 each 5  . furosemide (LASIX) 40 MG tablet Take 1 tablet (40 mg total) by mouth daily. 90 tablet 3  . gabapentin (NEURONTIN) 400 MG capsule Take 400 mg by mouth 3 (three) times daily.    . hydrOXYzine (ATARAX/VISTARIL) 10 MG tablet Take 1 tablet (10 mg total) by mouth 3 (three) times daily as needed. 30 tablet 0  . metoprolol tartrate (LOPRESSOR) 100 MG tablet Take 1 tablet (100 mg) by mouth 2 hours prior to your Cardiac CT scan 1 tablet 0  . omeprazole (PRILOSEC) 20 MG capsule TAKE 1 CAPSULE BY MOUTH  DAILY 90 capsule 3  . phentermine 30 MG capsule Take 1 capsule (30 mg total) by mouth every morning. 30 capsule 2  . potassium chloride SA (KLOR-CON) 20 MEQ tablet Take 1 tablet (20 mEq total) by mouth daily. 90 tablet 1  . tolterodine (DETROL) 2 MG tablet TAKE 1 TABLET BY MOUTH  TWICE DAILY 180 tablet 3  . traZODone (DESYREL) 100 MG tablet TAKE 1 TABLET BY MOUTH AT  BEDTIME 90 tablet 1  . triamterene-hydrochlorothiazide (MAXZIDE-25) 37.5-25 MG tablet TAKE 1 TABLET BY MOUTH  DAILY 90 tablet 1  . venlafaxine XR (EFFEXOR-XR) 75 MG 24 hr capsule Take 1 capsule (75 mg total) by mouth daily.  90 capsule 3  . XARELTO 20 MG TABS tablet TAKE 1 TABLET BY MOUTH  DAILY WITH SUPPER 90 tablet 3  . [DISCONTINUED] rivaroxaban (XARELTO) 20 MG TABS tablet Take 1 tablet (20 mg total) by mouth daily with supper. 30 tablet 5   No current facility-administered medications on file prior to visit.    There are no Patient Instructions on file for this visit. No follow-ups on file.   Kris Hartmann, NP

## 2020-06-10 ENCOUNTER — Other Ambulatory Visit: Payer: Self-pay | Admitting: Physician Assistant

## 2020-06-10 DIAGNOSIS — M542 Cervicalgia: Secondary | ICD-10-CM

## 2020-06-16 ENCOUNTER — Other Ambulatory Visit: Payer: Self-pay | Admitting: Physician Assistant

## 2020-06-16 DIAGNOSIS — E6609 Other obesity due to excess calories: Secondary | ICD-10-CM

## 2020-06-16 DIAGNOSIS — H6981 Other specified disorders of Eustachian tube, right ear: Secondary | ICD-10-CM

## 2020-06-16 DIAGNOSIS — F4323 Adjustment disorder with mixed anxiety and depressed mood: Secondary | ICD-10-CM

## 2020-06-16 DIAGNOSIS — I10 Essential (primary) hypertension: Secondary | ICD-10-CM

## 2020-06-16 DIAGNOSIS — K219 Gastro-esophageal reflux disease without esophagitis: Secondary | ICD-10-CM

## 2020-06-16 NOTE — Telephone Encounter (Signed)
Requested medication (s) are due for refill today: Yes  Requested medication (s) are on the active medication list: Yes  Last refill:  05/13/20  Future visit scheduled: No  Notes to clinic:  Unable to refill per protocol, cannot delegate     Requested Prescriptions  Pending Prescriptions Disp Refills   phentermine 30 MG capsule [Pharmacy Med Name: PHENTERMINE  30MG   CAP] 90 capsule     Sig: TAKE 1 CAPSULE BY MOUTH IN  THE MORNING      Not Delegated - Gastroenterology:  Antiobesity Agents Failed - 06/16/2020  2:35 PM      Failed - This refill cannot be delegated      Passed - Last BP in normal range    BP Readings from Last 1 Encounters:  06/03/20 107/69          Passed - Last Heart Rate in normal range    Pulse Readings from Last 1 Encounters:  06/03/20 92          Passed - Valid encounter within last 12 months    Recent Outpatient Visits           1 month ago Atelectasis of both lungs   [facility] [place], [place], [place]   4 months ago Hypokalemia   [facility] [place], [employer], [place]   4 months ago Edema of right lower extremity   [facility] [place], [place]   7 months ago Acute deep vein thrombosis (DVT) of proximal vein of left lower extremity [facility])   [facility] [place], [place], [place]   1 year ago Neck pain   [facility] [place], [place], [place]               Signed Prescriptions Disp Refills   omeprazole (PRILOSEC) 20 MG capsule 90 capsule 3    Sig: TAKE 1 CAPSULE BY MOUTH  DAILY      Gastroenterology: Proton Pump Inhibitors Passed - 06/16/2020  2:35 PM      Passed - Valid encounter within last 12 months    Recent Outpatient Visits           1 month ago Atelectasis of both lungs   [facility] [place], [place], [place]   4 months ago Hypokalemia   [facility] [place], [place], [place]   4 months ago Edema of right lower  extremity   [facility] [place], [place]   7 months ago Acute deep vein thrombosis (DVT) of proximal vein of left lower extremity [facility])   [facility] [place], [place], [place]   1 year ago Neck pain   [facility] [place], [place]

## 2020-06-16 NOTE — Telephone Encounter (Signed)
[facility] faxed refill request for the following medications:  fluticasone (FLONASE) 50 MCG/ACT nasal spray   Please advise. Thanks, [employer]

## 2020-06-17 MED ORDER — FLUTICASONE PROPIONATE 50 MCG/ACT NA SUSP: 2.0000 | Freq: Every day | NASAL | 6 refills | Status: DC

## 2020-07-08 ENCOUNTER — Ambulatory Visit: Payer: Managed Care, Other (non HMO) | Admitting: Internal Medicine

## 2020-07-08 ENCOUNTER — Other Ambulatory Visit: Payer: Self-pay

## 2020-07-08 ENCOUNTER — Encounter: Payer: Self-pay | Admitting: Internal Medicine

## 2020-07-08 ENCOUNTER — Other Ambulatory Visit
Admission: RE | Admit: 2020-07-08 | Discharge: 2020-07-08 | Disposition: A | Discharge status: Home / Self Care | Payer: Managed Care, Other (non HMO) | Attending: Internal Medicine | Admitting: Internal Medicine

## 2020-07-08 ENCOUNTER — Telehealth: Payer: Self-pay | Admitting: Internal Medicine

## 2020-07-08 VITALS — BP 140/84 | HR 85 | Temp 97.8°F | Ht 66.0 in | Wt 188.0 lb

## 2020-07-08 DIAGNOSIS — R062 Wheezing: Secondary | ICD-10-CM | POA: Diagnosis not present

## 2020-07-08 DIAGNOSIS — R0602 Shortness of breath: Secondary | ICD-10-CM

## 2020-07-08 DIAGNOSIS — Z87898 Personal history of other specified conditions: Secondary | ICD-10-CM

## 2020-07-08 DIAGNOSIS — R0600 Dyspnea, unspecified: Secondary | ICD-10-CM

## 2020-07-08 DIAGNOSIS — R0609 Other forms of dyspnea: Secondary | ICD-10-CM

## 2020-07-08 DIAGNOSIS — Z23 Encounter for immunization: Secondary | ICD-10-CM

## 2020-07-08 LAB — CBC WITH DIFFERENTIAL/PLATELET
Abs Immature Granulocytes: 0.17 10*3/uL — ABNORMAL HIGH (ref 0.00–0.07)
Basophils Absolute: 0.1 10*3/uL (ref 0.0–0.1)
Basophils Relative: 1 %
Eosinophils Absolute: 0.2 10*3/uL (ref 0.0–0.5)
Eosinophils Relative: 2 %
HCT: 36.3 % (ref 36.0–46.0)
Hemoglobin: 10.7 g/dL — ABNORMAL LOW (ref 12.0–15.0)
Immature Granulocytes: 2 %
Lymphocytes Relative: 12 %
Lymphs Abs: 1.2 10*3/uL (ref 0.7–4.0)
MCH: 22.6 pg — ABNORMAL LOW (ref 26.0–34.0)
MCHC: 29.5 g/dL — ABNORMAL LOW (ref 30.0–36.0)
MCV: 76.6 fL — ABNORMAL LOW (ref 80.0–100.0)
Monocytes Absolute: 1 10*3/uL (ref 0.1–1.0)
Monocytes Relative: 11 %
Neutro Abs: 6.7 10*3/uL (ref 1.7–7.7)
Neutrophils Relative %: 72 %
Platelets: 233 10*3/uL (ref 150–400)
RBC: 4.74 MIL/uL (ref 3.87–5.11)
RDW: 18.5 % — ABNORMAL HIGH (ref 11.5–15.5)
WBC: 9.3 10*3/uL (ref 4.0–10.5)
nRBC: 0 % (ref 0.0–0.2)

## 2020-07-08 LAB — BASIC METABOLIC PANEL
Anion gap: 14 (ref 5–15)
BUN: 32 mg/dL — ABNORMAL HIGH (ref 8–23)
CO2: 29 mmol/L (ref 22–32)
Calcium: 9.2 mg/dL (ref 8.9–10.3)
Chloride: 95 mmol/L — ABNORMAL LOW (ref 98–111)
Creatinine, Ser: 1.13 mg/dL — ABNORMAL HIGH (ref 0.44–1.00)
GFR, Estimated: 54 mL/min — ABNORMAL LOW (ref >60)
Glucose, Bld: 142 mg/dL — ABNORMAL HIGH (ref 70–99)
Potassium: 3.3 mmol/L — ABNORMAL LOW (ref 3.5–5.1)
Sodium: 138 mmol/L (ref 135–145)

## 2020-07-08 MED ORDER — POTASSIUM CHLORIDE ER 10 MEQ PO TBCR: 10.0000 meq | EXTENDED_RELEASE_TABLET | Freq: Every day | ORAL | 0 refills | Status: DC

## 2020-07-08 NOTE — Telephone Encounter (Signed)
Still anemic but stable 10.7gm% -p. D/w PCP   Creat slight high but stable -. Dw PCP  Potassium - low - -> prob from lasix -> plan take KCL 24meq daily x  7 days > then follow with doc who is giving lasix    No Known Allergies     Current Outpatient Medications:  .  atenolol (TENORMIN) 25 MG tablet, Take 1 tablet (25 mg total) by mouth daily., Disp: 90 tablet, Rfl: 3 .  betamethasone dipropionate 0.05 % cream, Apply 1 application topically as needed. , Disp: , Rfl:  .  Biotin 1 MG CAPS, , Disp: , Rfl:  .  carbamazepine (TEGRETOL XR) 100 MG 12 hr tablet, , Disp: , Rfl:  .  fexofenadine (ALLEGRA) 180 MG tablet, Take 180 mg by mouth daily. , Disp: , Rfl:  .  fluticasone (FLONASE) 50 MCG/ACT nasal spray, Place 2 sprays into both nostrils daily., Disp: 16 g, Rfl: 6 .  fluticasone furoate-vilanterol (BREO ELLIPTA) 100-25 MCG/INH AEPB, Inhale 1 puff into the lungs daily. (Patient not taking: Reported on 07/08/2020), Disp: 60 each, Rfl: 5 .  furosemide (LASIX) 40 MG tablet, Take 1 tablet (40 mg total) by mouth daily., Disp: 90 tablet, Rfl: 3 .  gabapentin (NEURONTIN) 400 MG capsule, Take 400 mg by mouth 3 (three) times daily., Disp: , Rfl:  .  hydrOXYzine (ATARAX/VISTARIL) 10 MG tablet, Take 1 tablet (10 mg total) by mouth 3 (three) times daily as needed., Disp: 30 tablet, Rfl: 0 .  metoprolol tartrate (LOPRESSOR) 100 MG tablet, Take 1 tablet (100 mg) by mouth 2 hours prior to your Cardiac CT scan, Disp: 1 tablet, Rfl: 0 .  omeprazole (PRILOSEC) 20 MG capsule, TAKE 1 CAPSULE BY MOUTH  DAILY, Disp: 90 capsule, Rfl: 3 .  phentermine 30 MG capsule, TAKE 1 CAPSULE BY MOUTH IN  THE MORNING, Disp: 90 capsule, Rfl: 0 .  potassium chloride SA (KLOR-CON) 20 MEQ tablet, Take 1 tablet (20 mEq total) by mouth daily., Disp: 90 tablet, Rfl: 1 .  tolterodine (DETROL) 2 MG tablet, TAKE 1 TABLET BY MOUTH  TWICE DAILY, Disp: 180 tablet, Rfl: 3 .  traZODone (DESYREL) 100 MG tablet, TAKE 1 TABLET BY MOUTH AT   BEDTIME, Disp: 90 tablet, Rfl: 1 .  triamterene-hydrochlorothiazide (MAXZIDE-25) 37.5-25 MG tablet, TAKE 1 TABLET BY MOUTH  DAILY, Disp: 90 tablet, Rfl: 1 .  venlafaxine XR (EFFEXOR-XR) 75 MG 24 hr capsule, Take 1 capsule (75 mg total) by mouth daily., Disp: 90 capsule, Rfl: 3 .  XARELTO 20 MG TABS tablet, TAKE 1 TABLET BY MOUTH  DAILY WITH SUPPER, Disp: 90 tablet, Rfl: 3   LABS    PULMONARY No results for input(s): PHART, PCO2ART, PO2ART, HCO3, TCO2, O2SAT in the last 168 hours.  Invalid input(s): PCO2, PO2  CBC Recent Labs  Lab 07/08/20 1037  HGB 10.7*  HCT 36.3  WBC 9.3  PLT 233    COAGULATION No results for input(s): INR in the last 168 hours.  CARDIAC  No results for input(s): TROPONINI in the last 168 hours. No results for input(s): PROBNP in the last 168 hours.   CHEMISTRY Recent Labs  Lab 07/08/20 1037  NA 138  K 3.3*  CL 95*  CO2 29  GLUCOSE 142*  BUN 32*  CREATININE 1.13*  CALCIUM 9.2   Estimated Creatinine Clearance: 54.6 mL/min (A) (by C-G formula based on SCr of 1.13 mg/dL (H)).   LIVER No results for input(s): AST, ALT, ALKPHOS, BILITOT, PROT, ALBUMIN,  INR in the last 168 hours.   INFECTIOUS No results for input(s): LATICACIDVEN, PROCALCITON in the last 168 hours.   ENDOCRINE CBG (last 3)  No results for input(s): GLUCAP in the last 72 hours.       IMAGING x48h  - image(s) personally visualized  -   highlighted in bold No results found.

## 2020-07-08 NOTE — Patient Instructions (Addendum)
Dyspnea on exertion Wheeze History anemia  -Multiple reasons why somebody could be short of breath.  This includes physical deconditioning, stiff heart muscle, asthma, pulmonary issues such as pulmonary fibrosis and even anemia  Plan Do full PFT Do HRCT supine and prone Do CBC with diff and bmet Do blood IgE  Please indicate that you want the test all done before July 14, 2020 due to insurance issues presents with her husband.   History of snoring  -You might have underlying sleep apnea  Plan -Do overnight pulse oximetry on room air -Based on other results we might refer you to the sleep doctor at some point   Flu vaccine need  -Flu shot today and then 1 or 2 weeks later get the third booster Covid vaccine  Follow-up -Return in the next few to several weeks but after completing of the above test.  Okay to see nurse practitioner.

## 2020-07-08 NOTE — Progress Notes (Signed)
OV 07/08/2020  Subjective:  Patient ID: Kristi Lee, female , DOB: Jul 11, 1956 , age 64 y.o. , MRN: [id number] , ADDRESS: [place] [id number] PCP Mar Daring, PA-C Patient Care Team: [name] as PCP - General (Family Medicine) Kate Sable, MD as PCP - Cardiology (Cardiology)  This Provider for this visit: Treatment Team:  Attending Provider: Brand Males, MD    07/08/2020 -   Chief Complaint  Patient presents with  . pulmonary consult    per Fenton Malling, PA--c/o sob with exertion x2y     HPI Kristi Lee 64 y.o. - She reports insidious onset of shortness of breath the last 2 years.  She does not think it is progressive for the husband who is here with her thinks it is progressive.  She tells me that she can climb 1 flight of stairs but gets very tired and short of breath when she reaches 1 flight of stairs but she cannot do 2 flights of stairs.  She is able to do household work but she also gets short of breath with that.  Today when she parked the car and she came to the hospital after she had to the hospital she needed a wheelchair to come to the office because of shortness of breath.  Occasionally this wheezing with is no cough.  There is no orthopnea proximal nocturnal dyspnea.  There is no hemoptysis.  In the summer 2020 when she was diagnosed with bilateral DVT.  She is on Eliquis for that.  She had a CT scan of the chest at that time some pleural effusions are noted.  She had echocardiogram with ejection fraction systolic and diastolic were normal.  I reviewed the report.  I personally visualized the images.  Lab review also shows she has some anemia last checked in summer 2021.  Today when we walked her she got quite short of breath and stop but she did not desaturate at all.  She is up-to-date with her vaccines but is in need of a flu shot and also Covid booster.  The husband reports  history of snoring at night but they are not sure about apneic spells or excessive daytime somnolence   Simple office walk 185 feet x  3 laps goal with forehead probe 07/08/2020   O2 used ra  Number laps completed 2 of 3 due to dyspnea stopped  Comments about pace slow  Resting Pulse Ox/HR 100% and 82/min  Final Pulse Ox/HR 99% and 110/min  Desaturated </= 88% no  Desaturated <= 3% points no  Got Tachycardic >/= 90/min yes  Symptoms at end of test Dyspnea and tired  Miscellaneous comments Stopped prematures   Results for ARIYON, GERSTENBERGER (MRN [id number]) as of 07/08/2020 09:34  Ref. Range 01/19/2020 06:00 01/24/2020 20:23  Hemoglobin Latest Ref Range: 12.0 - 15.0 g/dL 9.5 (L) 10.4 (L)   IMPRESSION: 1. No evidence of a pulmonary embolism. 2. Trace pleural effusions with interstitial thickening consistent with mild pulmonary edema. 3. No evidence pneumonia.   Electronically Signed   By: Lajean Manes M.D.   On: 01/19/2020 09:30   [place] June 2021  IMPRESSION: Study is positive for a small, nonocclusive DVT within the right popliteal vein.   Electronically Signed   By: Constance Holster M.D.   On: 01/24/2020 23:05   ECHO July 2021  IMPRESSIONS    1. Left ventricular ejection fraction, by estimation, is 55  to 60%. The  left ventricle has normal function. The left ventricle has no regional  wall motion abnormalities. There is mild left ventricular hypertrophy.  Left ventricular diastolic parameters  were normal.  2. Right ventricular systolic function is normal. The right ventricular  size is normal. There is normal pulmonary artery systolic pressure.  3. The mitral valve is degenerative. Mild mitral valve regurgitation. No  evidence of mitral stenosis.  4. The aortic valve is tricuspid. Aortic valve regurgitation is not  visualized. No aortic stenosis is present.  5. The inferior vena cava is normal in size with greater than 50%  respiratory  variability, suggesting right atrial pressure of 3 mmHg.   ROS - per HPI     has a past medical history of Arthritis, Depression, DVT (deep venous thrombosis) ([place]) (01/25/2020), GERD (gastroesophageal reflux disease), Hypertension, PONV (postoperative nausea and vomiting), and Scoliosis.   reports that she has never smoked. She has never used smokeless tobacco.  Past Surgical History:  Procedure Laterality Date  . ABDOMINAL HYSTERECTOMY    . BACK SURGERY  10/2000  . BREAST EXCISIONAL BIOPSY Right 1990's   NEG  . COLONOSCOPY WITH PROPOFOL N/A 03/23/2020   Procedure: COLONOSCOPY WITH PROPOFOL;  Surgeon: Lucilla Lame, MD;  Location: [facility];  Service: Endoscopy;  Laterality: N/A;  priority 4  . FEMUR FRACTURE SURGERY Right 07/15/2009   Pertrochanteric femur fracture.  [name] HIP FRACTURE SURGERY Right   . PERIPHERAL VASCULAR THROMBECTOMY Left 11/11/2019   Procedure: PERIPHERAL VASCULAR THROMBECTOMY;  Surgeon: Algernon Huxley, MD;  Location: [facility];  Service: Cardiovascular;  Laterality: Left;  . POLYPECTOMY  03/23/2020   Procedure: POLYPECTOMY;  Surgeon: Lucilla Lame, MD;  Location: [facility];  Service: Endoscopy;;  . REVISION TOTAL HIP ARTHROPLASTY Left     No Known Allergies  Immunization History  Administered Date(s) Administered  . Influenza Split 05/30/2013  . Moderna SARS-COVID-2 Vaccination 10/16/2019, 11/13/2019  . Tdap 08/01/2012    Family History  Problem Relation Age of Onset  . Osteoarthritis Mother   . Lung cancer Father   . Diabetes Brother   . Breast cancer Neg Hx      Current Outpatient Medications:  .  atenolol (TENORMIN) 25 MG tablet, Take 1 tablet (25 mg total) by mouth daily., Disp: 90 tablet, Rfl: 3 .  betamethasone dipropionate 0.05 % cream, Apply 1 application topically as needed. , Disp: , Rfl:  .  Biotin 1 MG CAPS, , Disp: , Rfl:  .  carbamazepine (TEGRETOL XR) 100 MG 12 hr tablet, , Disp: , Rfl:  .  fexofenadine  (ALLEGRA) 180 MG tablet, Take 180 mg by mouth daily. , Disp: , Rfl:  .  fluticasone (FLONASE) 50 MCG/ACT nasal spray, Place 2 sprays into both nostrils daily., Disp: 16 g, Rfl: 6 .  furosemide (LASIX) 40 MG tablet, Take 1 tablet (40 mg total) by mouth daily., Disp: 90 tablet, Rfl: 3 .  gabapentin (NEURONTIN) 400 MG capsule, Take 400 mg by mouth 3 (three) times daily., Disp: , Rfl:  .  hydrOXYzine (ATARAX/VISTARIL) 10 MG tablet, Take 1 tablet (10 mg total) by mouth 3 (three) times daily as needed., Disp: 30 tablet, Rfl: 0 .  metoprolol tartrate (LOPRESSOR) 100 MG tablet, Take 1 tablet (100 mg) by mouth 2 hours prior to your Cardiac CT scan, Disp: 1 tablet, Rfl: 0 .  omeprazole (PRILOSEC) 20 MG capsule, TAKE 1 CAPSULE BY MOUTH  DAILY, Disp: 90 capsule, Rfl: 3 .  phentermine  30 MG capsule, TAKE 1 CAPSULE BY MOUTH IN  THE MORNING, Disp: 90 capsule, Rfl: 0 .  potassium chloride SA (KLOR-CON) 20 MEQ tablet, Take 1 tablet (20 mEq total) by mouth daily., Disp: 90 tablet, Rfl: 1 .  tolterodine (DETROL) 2 MG tablet, TAKE 1 TABLET BY MOUTH  TWICE DAILY, Disp: 180 tablet, Rfl: 3 .  traZODone (DESYREL) 100 MG tablet, TAKE 1 TABLET BY MOUTH AT  BEDTIME, Disp: 90 tablet, Rfl: 1 .  triamterene-hydrochlorothiazide (MAXZIDE-25) 37.5-25 MG tablet, TAKE 1 TABLET BY MOUTH  DAILY, Disp: 90 tablet, Rfl: 1 .  venlafaxine XR (EFFEXOR-XR) 75 MG 24 hr capsule, Take 1 capsule (75 mg total) by mouth daily., Disp: 90 capsule, Rfl: 3 .  XARELTO 20 MG TABS tablet, TAKE 1 TABLET BY MOUTH  DAILY WITH SUPPER, Disp: 90 tablet, Rfl: 3 .  fluticasone furoate-vilanterol (BREO ELLIPTA) 100-25 MCG/INH AEPB, Inhale 1 puff into the lungs daily. (Patient not taking: Reported on 07/08/2020), Disp: 60 each, Rfl: 5      Objective:   Vitals:   07/08/20 0919  BP: 140/84  Pulse: 85  Temp: 97.8 F (36.6 C)  TempSrc: Temporal  SpO2: 100%  Weight: 188 lb (85.3 kg)  Height: 5\' 6"  (1.676 m)    Estimated body mass index is 30.34 kg/m as  calculated from the following:   Height as of this encounter: 5\' 6"  (1.676 m).   Weight as of this encounter: 188 lb (85.3 kg).  @WEIGHTCHANGE @  [employer]   07/08/20 0919  Weight: 188 lb (85.3 kg)     Physical Exam  General Appearance:    Alert, cooperative, no distress, appears stated age - overwigh ,  Deconditioned looking - yes , OBESE  -yes Sitting on Wheelchair -  yes  Head:    Normocephalic, without obvious abnormality, atraumatic  Eyes:    PERRL, conjunctiva/corneas clear,  Ears:    Normal TM's and external ear canals, both ears  Nose:   Nares normal, septum midline, mucosa normal, no drainage    or sinus tenderness. OXYGEN ON  - no . Patient is @ no   Throat:   Lips, mucosa, and tongue normal; teeth and gums normal. Cyanosis on lips - no  Neck:   Supple, symmetrical, trachea midline, no adenopathy;    thyroid:  no enlargement/tenderness/nodules; no carotid   bruit or JVD  Back:     Symmetric, no curvature, ROM normal, no CVA tenderness  Lungs:     Distress - no , Wheeze no, Barrell Chest - no, Purse lip breathing - no, Crackles - no   Chest Wall:    No tenderness or deformity.    Heart:    Regular rate and rhythm, S1 and S2 normal, no rub   or gallop, Murmur - no  Breast Exam:    NOT DONE  Abdomen:     Soft, non-tender, bowel sounds active all four quadrants,    no masses, no organomegaly. Visceral obesity - yes  Genitalia:   NOT DONE  Rectal:   NOT DONE  Extremities:   Extremities - normal, Has Cane - no, Clubbing - no, Edema - no  Pulses:   2+ and symmetric all extremities  Skin:   Stigmata of Connective Tissue Disease - no  Lymph nodes:   Cervical, supraclavicular, and axillary nodes normal  Psychiatric:  Neurologic:   Pleasant - yes, Anxious - no, Flat affect - no  CAm-ICU - neg, Alert and Oriented x 3 - yes, Moves  all 4s - yes, Speech - normal, Cognition - intact         Assessment:       ICD-10-CM   1. Dyspnea on exertion  R06.00   2. Wheeze  R06.2    3. History of snoring  Z87.898   4. Flu vaccine need  Z23   5. Shortness of breath  R06.02        Plan:     Patient Instructions  Dyspnea on exertion Wheeze History anemia  -Multiple reasons why somebody could be short of breath.  This includes physical deconditioning, stiff heart muscle, asthma, pulmonary issues such as pulmonary fibrosis and even anemia  Plan Do full PFT Do HRCT supine and prone Do CBC with diff and bmet Do blood IgE  Please indicate that you want the test all done before July 14, 2020 due to insurance issues presents with her husband.   History of snoring  -You might have underlying sleep apnea  Plan -Do overnight pulse oximetry on room air -Based on other results we might refer you to the sleep doctor at some point   Flu vaccine need  -Flu shot today and then 1 or 2 weeks later get the third booster Covid vaccine  Follow-up -Return in the next few to several weeks but after completing of the above test.  Okay to see nurse practitioner.     SIGNATURE    Dr. Brand Males, M.D., F.C.C.P,  Pulmonary and Critical Care Medicine Staff Physician, [place] Director - Interstitial Lung Disease  Program  Pulmonary [place] [facility] [place], [place], [address]  Pager: [phone], If no answer or between  15:00h - 7:00h: call [phone] Telephone: [phone]  9:53 AM 07/08/2020

## 2020-07-08 NOTE — Telephone Encounter (Signed)
Patient is aware of results and voiced her understanding.   Drug to drug with K-dur and maxzide. Dr. Chase Caller has been made aware of this. MR recommends K-DUR 10mg  daily for 7 days.  Rx has [facility] sent to [facility].  Nothing further needed.

## 2020-07-10 LAB — IGE: IgE (Immunoglobulin E), Serum: 30 IU/mL (ref 6–495)

## 2020-07-17 ENCOUNTER — Institutional Professional Consult (permissible substitution): Payer: Managed Care, Other (non HMO) | Admitting: Internal Medicine

## 2020-07-20 ENCOUNTER — Encounter: Payer: Self-pay | Admitting: Ophthalmology

## 2020-07-20 ENCOUNTER — Ambulatory Visit
Admission: RE | Admit: 2020-07-20 | Discharge: 2020-07-20 | Disposition: A | Discharge status: Home / Self Care | Payer: Medicare Other | Source: Ambulatory Visit | Attending: Internal Medicine | Admitting: Internal Medicine

## 2020-07-20 ENCOUNTER — Other Ambulatory Visit: Payer: Self-pay

## 2020-07-20 DIAGNOSIS — R0602 Shortness of breath: Secondary | ICD-10-CM | POA: Diagnosis present

## 2020-07-21 ENCOUNTER — Other Ambulatory Visit
Admission: RE | Admit: 2020-07-21 | Discharge: 2020-07-21 | Disposition: A | Discharge status: Home / Self Care | Payer: Medicare Other | Source: Ambulatory Visit | Attending: Ophthalmology | Admitting: Ophthalmology

## 2020-07-21 ENCOUNTER — Telehealth: Payer: Self-pay | Admitting: Physician Assistant

## 2020-07-21 ENCOUNTER — Telehealth: Payer: Self-pay | Admitting: Internal Medicine

## 2020-07-21 DIAGNOSIS — Z01812 Encounter for preprocedural laboratory examination: Secondary | ICD-10-CM | POA: Insufficient documentation

## 2020-07-21 DIAGNOSIS — Z20822 Contact with and (suspected) exposure to covid-19: Secondary | ICD-10-CM | POA: Insufficient documentation

## 2020-07-21 LAB — SARS CORONAVIRUS 2 (TAT 6-24 HRS): SARS Coronavirus 2: NEGATIVE

## 2020-07-21 NOTE — Telephone Encounter (Signed)
We ordered an ONO for this patient on 11/24 and she has not yet been contacted to schedule. PCCs please advise, thanks!

## 2020-07-21 NOTE — Discharge Instructions (Signed)
INSTRUCTIONS FOLLOWING OCULOPLASTIC SURGERY AMY M. FOWLER, MD  AFTER YOUR EYE SURGERY, THER ARE MANY THINGS WHICH YOU, THE PATIENT, CAN DO TO ASSURE THE BEST POSSIBLE RESULT FROM YOUR OPERATION.  THIS SHEET SHOULD BE REFERRED TO WHENEVER QUESTIONS ARISE.  IF THERE ARE ANY QUESTIONS NOT ANSWERED HERE, DO NOT HESITATE TO CALL OUR OFFICE AT [phone] OR [phone].  THERE IS ALWAYS SOMEONE AVAILABLE TO CALL IF QUESTIONS OR PROBLEMS ARISE.  VISION: Your vision may be blurred and out of focus after surgery until you are able to stop using your ointment, swelling resolves and your eye(s) heal. This may take 1 to 2 weeks at the least.  If your vision becomes gradually more dim or dark, this is not normal and you need to call our office immediately.  EYE CARE: For the first 48 hours after surgery, use ice packs frequently - "20 minutes on, 20 minutes off" - to help reduce swelling and bruising.  Small bags of frozen peas or corn make good ice packs along with cloths soaked in ice water.  If you are wearing a patch or other type of dressing following surgery, keep this on for the amount of time specified by your doctor.  For the first week following surgery, you will need to treat your stitches with great care.  It is OK to shower, but take care to not allow soapy water to run into your eye(s) to help reduce chances of infection.  You may gently clean the eyelashes and around the eye(s) with cotton balls and sterile water, BUT DO NOT RUB THE STITCHES VIGOROUSLY.  Keeping your stitches moist with ointment will help promote healing with minimal scar formation.  ACTIVITY: When you leave the surgery center, you should go home, rest and be inactive.  The eye(s) may feel scratchy and keeping the eyes closed will allow for faster healing.  The first week following surgery, avoid straining (anything making the face turn red) or lifting over 20 pounds.  Additionally, avoid bending which causes your head to go below  your waist.  Using your eyes will NOT harm them, so feel free to read, watch television, use the computer, etc as desired.  Driving depends on each individual, so check with your doctor if you have questions about driving. Do not wear contact lenses for about 2 weeks.  Do not wear eye makeup for 2 weeks.  Avoid swimming, hot tubs, gardening, and dusting for 1 to 2 weeks to reduce the risk of an infection.  MEDICATIONS:  You will be given a prescription for an ointment to use 4 times a day on your stitches.  You can use the ointment in your eyes if they feel scratchy or irritated.  If you eyelid(s) don't close completely when you sleep, put some ointment in your eyes before bedtime.  EMERGENCY: If you experience SEVERE EYE PAIN OR HEADACHE UNRELIEVED BY TYLENOL OR TRAMADOL, NAUSEA OR VOMITING, WORSENING REDNESS, OR WORSENING VISION (ESPECIALLY VISION THAT WAS INITIALLY BETTER) CALL [phone] OR [phone] DURING BUSINESS HOURS OR AFTER HOURS.  General Anesthesia, Adult, Care After This sheet gives you information about how to care for yourself after your procedure. Your health care provider may also give you more specific instructions. If you have problems or questions, contact your health care provider. What can I expect after the procedure? After the procedure, the following side effects are common:  Pain or discomfort at the IV site.  Nausea.  Vomiting.  Sore throat.  Trouble concentrating.    Feeling cold or chills.  Weak or tired.  Sleepiness and fatigue.  Soreness and body aches. These side effects can affect parts of the body that were not involved in surgery. Follow these instructions at home:  For at least 24 hours after the procedure:  Have a responsible adult stay with you. It is important to have someone help care for you until you are awake and alert.  Rest as needed.  Do not: ? Participate in activities in which you could fall or become injured. ? Drive. ? Use  heavy machinery. ? Drink alcohol. ? Take sleeping pills or medicines that cause drowsiness. ? Make important decisions or sign legal documents. ? Take care of children on your own. Eating and drinking  Follow any instructions from your health care provider about eating or drinking restrictions.  When you feel hungry, start by eating small amounts of foods that are soft and easy to digest (bland), such as toast. Gradually return to your regular diet.  Drink enough fluid to keep your urine pale yellow.  If you vomit, rehydrate by drinking water, juice, or clear broth. General instructions  If you have sleep apnea, surgery and certain medicines can increase your risk for breathing problems. Follow instructions from your health care provider about wearing your sleep device: ? Anytime you are sleeping, including during daytime naps. ? While taking prescription pain medicines, sleeping medicines, or medicines that make you drowsy.  Return to your normal activities as told by your health care provider. Ask your health care provider what activities are safe for you.  Take over-the-counter and prescription medicines only as told by your health care provider.  If you smoke, do not smoke without supervision.  Keep all follow-up visits as told by your health care provider. This is important. Contact a health care provider if:  You have nausea or vomiting that does not get better with medicine.  You cannot eat or drink without vomiting.  You have pain that does not get better with medicine.  You are unable to pass urine.  You develop a skin rash.  You have a fever.  You have redness around your IV site that gets worse. Get help right away if:  You have difficulty breathing.  You have chest pain.  You have blood in your urine or stool, or you vomit blood. Summary  After the procedure, it is common to have a sore throat or nausea. It is also common to feel tired.  Have a  responsible adult stay with you for the first 24 hours after general anesthesia. It is important to have someone help care for you until you are awake and alert.  When you feel hungry, start by eating small amounts of foods that are soft and easy to digest (bland), such as toast. Gradually return to your regular diet.  Drink enough fluid to keep your urine pale yellow.  Return to your normal activities as told by your health care provider. Ask your health care provider what activities are safe for you. This information is not intended to replace advice given to you by your health care provider. Make sure you discuss any questions you have with your health care provider. Document Revised: 08/04/2017 Document Reviewed: 03/17/2017 Elsevier Patient Education  2020 Elsevier Inc.  

## 2020-07-22 ENCOUNTER — Other Ambulatory Visit
Admission: RE | Admit: 2020-07-22 | Discharge: 2020-07-22 | Disposition: A | Discharge status: Home / Self Care | Payer: Medicare Other | Source: Ambulatory Visit | Attending: Internal Medicine | Admitting: Internal Medicine

## 2020-07-22 ENCOUNTER — Other Ambulatory Visit: Payer: Self-pay | Admitting: Internal Medicine

## 2020-07-22 DIAGNOSIS — R0602 Shortness of breath: Secondary | ICD-10-CM | POA: Insufficient documentation

## 2020-07-22 DIAGNOSIS — G4734 Idiopathic sleep related nonobstructive alveolar hypoventilation: Secondary | ICD-10-CM

## 2020-07-22 LAB — SEDIMENTATION RATE: Sed Rate: 44 mm/hr — ABNORMAL HIGH (ref 0–30)

## 2020-07-22 LAB — CK TOTAL AND CKMB (NOT AT ARMC)
CK, MB: 6.6 ng/mL — ABNORMAL HIGH (ref 0.5–5.0)
Relative Index: 4.5 — ABNORMAL HIGH (ref 0.0–2.5)
Total CK: 147 U/L (ref 38–234)

## 2020-07-22 NOTE — Telephone Encounter (Signed)
[name] from [facility] called me back and stated that [employer] had left messages for the patient on 11/29 and 12/3. They finally got to speak with the patient on 07/21/20 and Kristi Lee stated she was not aware of the ONO being ordered.  She also didn't want to drive to [name] to pick up the ONO machine. [name] didn't understand them sending it to the [name] location and will get the ONO setup her in [name]

## 2020-07-22 NOTE — Progress Notes (Signed)
Kristi Lee, No ILD but some nodularity in bohth UL . This is on CT chest  Plan  - Serum: ESR, ACE, ANA, DS-DNA, RF, anti-CCP,  ANCA screen, MPO, PR-3, Total CK,  Aldolase,  scl-70, ssA, ssB,  & Hypersensitivity Pneumonitis Panel   - Quant Gold blood test   - ensure followup with an aPP or myself  - looks like she needs ONO ordered too  Thanks  MR  CT Chest High Resolution  Result Date: 07/21/2020 CLINICAL DATA:  Shortness of breath EXAM: CT CHEST WITHOUT CONTRAST TECHNIQUE: Multidetector CT imaging of the chest was performed following the standard protocol without intravenous contrast. High resolution imaging of the lungs, as well as inspiratory and expiratory imaging, was performed. COMPARISON:  01/19/2020 FINDINGS: Cardiovascular: No significant vascular findings. Normal heart size. No pericardial effusion. Mediastinum/Nodes: No enlarged mediastinal, hilar, or axillary lymph nodes. Small hiatal hernia. Thyroid gland, trachea, and esophagus demonstrate no significant findings. Lungs/Pleura: Fine centrilobular nodularity throughout the lungs, most concentrated in the lung apices. Dependent scarring predominantly of the left lung base. Mild lobular air trapping on expiratory phase imaging. No pleural effusion or pneumothorax. Upper Abdomen: No acute abnormality.  Gallstones. Musculoskeletal: No chest wall mass or suspicious bone lesions identified. Extensive, partially imaged posterior thoracolumbar rod fusion with dextroscoliosis of the thoracic spine. IMPRESSION: 1. No evidence of fibrotic interstitial lung disease. There is dependent scarring, predominantly of the left lung base, most consistent with sequelae of prior infection or aspiration. 2. Fine centrilobular nodularity throughout the lungs, most concentrated in the lung apices. This is nonspecific and infectious or inflammatory, most commonly seen in smoking-related respiratory bronchiolitis. Hypersensitivity pneumonitis could have this  appearance if clinically suspected. 3. Mild lobular air trapping on expiratory phase imaging, suggestive of small airways disease. 4. Small hiatal hernia. 5. Cholelithiasis. Electronically Signed   By: Eddie Candle M.D.   On: 07/21/2020 10:49

## 2020-07-22 NOTE — Telephone Encounter (Signed)
LVM for Kristi Lee to check on this can call me back

## 2020-07-23 ENCOUNTER — Ambulatory Visit: Payer: Medicare Other | Admitting: Anesthesiology

## 2020-07-23 ENCOUNTER — Encounter: Payer: Self-pay | Admitting: Ophthalmology

## 2020-07-23 ENCOUNTER — Ambulatory Visit
Admission: RE | Admit: 2020-07-23 | Discharge: 2020-07-23 | Disposition: A | Discharge status: Home / Self Care | Payer: Medicare Other | Attending: Ophthalmology | Admitting: Ophthalmology

## 2020-07-23 ENCOUNTER — Encounter
Admission: RE | Disposition: A | Discharge status: Home / Self Care | Payer: Self-pay | Source: Home / Self Care | Attending: Ophthalmology

## 2020-07-23 ENCOUNTER — Other Ambulatory Visit: Payer: Self-pay

## 2020-07-23 DIAGNOSIS — H02833 Dermatochalasis of right eye, unspecified eyelid: Secondary | ICD-10-CM | POA: Insufficient documentation

## 2020-07-23 DIAGNOSIS — H02403 Unspecified ptosis of bilateral eyelids: Secondary | ICD-10-CM | POA: Diagnosis present

## 2020-07-23 DIAGNOSIS — Z96643 Presence of artificial hip joint, bilateral: Secondary | ICD-10-CM | POA: Insufficient documentation

## 2020-07-23 DIAGNOSIS — Z79899 Other long term (current) drug therapy: Secondary | ICD-10-CM | POA: Diagnosis not present

## 2020-07-23 DIAGNOSIS — Z7901 Long term (current) use of anticoagulants: Secondary | ICD-10-CM | POA: Diagnosis not present

## 2020-07-23 DIAGNOSIS — Z86718 Personal history of other venous thrombosis and embolism: Secondary | ICD-10-CM | POA: Insufficient documentation

## 2020-07-23 HISTORY — PX: BROW LIFT: SHX178

## 2020-07-23 LAB — PAN-ANCA
ANCA Proteinase 3: <3.5 U/mL (ref 0.0–3.5)
Atypical P-ANCA titer: <1:20 {titer}
C-ANCA: <1:20 {titer}
Myeloperoxidase Abs: <9 U/mL (ref 0.0–9.0)
P-ANCA: <1:20 {titer}

## 2020-07-23 LAB — RHEUMATOID FACTOR: Rheumatoid fact SerPl-aCnc: <10 IU/mL (ref <14.0)

## 2020-07-23 LAB — ANA W/REFLEX: Anti Nuclear Antibody (ANA): NEGATIVE

## 2020-07-23 LAB — MISC LABCORP TEST (SEND OUT)
Labcorp test code: 163840
Labcorp test code: 163857

## 2020-07-23 LAB — ANTI-DNA ANTIBODY, DOUBLE-STRANDED: ds DNA Ab: <1 IU/mL (ref 0–9)

## 2020-07-23 LAB — ANGIOTENSIN CONVERTING ENZYME: Angiotensin-Converting Enzyme: 40 U/L (ref 14–82)

## 2020-07-23 LAB — ALDOLASE: Aldolase: 7.3 U/L (ref 3.3–10.3)

## 2020-07-23 LAB — ANTI-SCLERODERMA ANTIBODY: Scleroderma (Scl-70) (ENA) Antibody, IgG: <0.2 AI (ref 0.0–0.9)

## 2020-07-23 LAB — POTASSIUM: Potassium: 3.5 mmol/L (ref 3.5–5.1)

## 2020-07-23 SURGERY — BLEPHAROPLASTY
Anesthesia: Monitor Anesthesia Care | Laterality: Bilateral

## 2020-07-23 MED ORDER — LACTATED RINGERS IV SOLN: INTRAVENOUS | Status: DC

## 2020-07-23 MED ORDER — PROPOFOL 500 MG/50ML IV EMUL
INTRAVENOUS | Status: DC | PRN
  Administered 2020-07-23: 25 ug/kg/min via INTRAVENOUS

## 2020-07-23 MED ORDER — NEOMYCIN-POLYMYXIN-DEXAMETH 0.1 % OP OINT: TOPICAL_OINTMENT | OPHTHALMIC | 0 refills | Status: DC

## 2020-07-23 MED ORDER — LIDOCAINE-EPINEPHRINE 2 %-1:100000 IJ SOLN
INTRAMUSCULAR | Status: DC | PRN
  Administered 2020-07-23: 5 mL via OPHTHALMIC

## 2020-07-23 MED ORDER — MIDAZOLAM HCL 2 MG/2ML IJ SOLN
INTRAMUSCULAR | Status: DC | PRN
  Administered 2020-07-23: 1 mg via INTRAVENOUS

## 2020-07-23 MED ORDER — LIDOCAINE HCL (CARDIAC) PF 100 MG/5ML IV SOSY
PREFILLED_SYRINGE | INTRAVENOUS | Status: DC | PRN
  Administered 2020-07-23: 50 mg via INTRAVENOUS

## 2020-07-23 MED ORDER — ALFENTANIL 500 MCG/ML IJ INJ
INJECTION | INTRAVENOUS | Status: DC | PRN
  Administered 2020-07-23: 200 ug via INTRAVENOUS
  Administered 2020-07-23: 500 ug via INTRAVENOUS

## 2020-07-23 MED ORDER — ERYTHROMYCIN 5 MG/GM OP OINT
TOPICAL_OINTMENT | OPHTHALMIC | Status: DC | PRN
  Administered 2020-07-23: 1 via OPHTHALMIC

## 2020-07-23 MED ORDER — TRAMADOL HCL 50 MG PO TABS
ORAL_TABLET | ORAL | 0 refills | Status: DC
Start: 2020-07-23 — End: 2020-08-25

## 2020-07-23 MED ORDER — TETRACAINE HCL 0.5 % OP SOLN
OPHTHALMIC | Status: DC | PRN
  Administered 2020-07-23: 2 [drp] via OPHTHALMIC

## 2020-07-23 SURGICAL SUPPLY — 36 items
APPLICATOR COTTON TIP WD 3 STR (MISCELLANEOUS) ×2 IMPLANT
BLADE SURG 15 STRL LF DISP TIS (BLADE) ×1 IMPLANT
BLADE SURG 15 STRL SS (BLADE) ×2
CORD BIP STRL DISP 12FT (MISCELLANEOUS) ×2 IMPLANT
DRAPE HEAD BAR (DRAPES) ×2 IMPLANT
GAUZE SPONGE 4X4 12PLY STRL (GAUZE/BANDAGES/DRESSINGS) ×2 IMPLANT
GLOVE SURG LX 7.0 MICRO (GLOVE) ×2
GLOVE SURG LX STRL 7.0 MICRO (GLOVE) ×2 IMPLANT
GOWN STRL REUS W/ TWL LRG LVL3 (GOWN DISPOSABLE) ×1 IMPLANT
GOWN STRL REUS W/TWL LRG LVL3 (GOWN DISPOSABLE) ×2
MARKER SKIN XFINE TIP W/RULER (MISCELLANEOUS) ×2 IMPLANT
NEEDLE FILTER BLUNT 18X 1/2SAF (NEEDLE) ×1
NEEDLE FILTER BLUNT 18X1 1/2 (NEEDLE) ×1 IMPLANT
NEEDLE HYPO 30X.5 LL (NEEDLE) ×4 IMPLANT
PACK ENT CUSTOM (PACKS) ×2 IMPLANT
SOL PREP PVP 2OZ (MISCELLANEOUS) ×2
SOLUTION PREP PVP 2OZ (MISCELLANEOUS) ×1 IMPLANT
SPONGE GAUZE 2X2 8PLY STRL LF (GAUZE/BANDAGES/DRESSINGS) ×20 IMPLANT
SUT CHROMIC 4-0 (SUTURE)
SUT CHROMIC 4-0 M2 12X2 ARM (SUTURE)
SUT CHROMIC 5 0 P 3 (SUTURE) IMPLANT
SUT ETHILON 4 0 CL P 3 (SUTURE) IMPLANT
SUT GUT PLAIN 6-0 1X18 ABS (SUTURE) ×2 IMPLANT
SUT MERSILENE 4-0 S-2 (SUTURE) IMPLANT
SUT PROLENE 5 0 P 3 (SUTURE) ×4 IMPLANT
SUT PROLENE 6 0 P 1 18 (SUTURE) IMPLANT
SUT SILK 4 0 G 3 (SUTURE) IMPLANT
SUT VIC AB 5-0 P-3 18X BRD (SUTURE) IMPLANT
SUT VIC AB 5-0 P3 18 (SUTURE)
SUT VICRYL 6-0  S14 CTD (SUTURE)
SUT VICRYL 6-0 S14 CTD (SUTURE) IMPLANT
SUT VICRYL 7 0 TG140 8 (SUTURE) IMPLANT
SUTURE CHRMC 4-0 M2 12X2 ARM (SUTURE) IMPLANT
SYR 10ML LL (SYRINGE) ×2 IMPLANT
SYR 3ML LL SCALE MARK (SYRINGE) ×2 IMPLANT
WATER STERILE IRR 250ML POUR (IV SOLUTION) ×2 IMPLANT

## 2020-07-23 NOTE — Anesthesia Procedure Notes (Signed)
Performed by: Billee Balcerzak, CRNA Pre-anesthesia Checklist: Patient identified, Emergency Drugs available, Suction available, Timeout performed and Patient being monitored Patient Re-evaluated:Patient Re-evaluated prior to induction Oxygen Delivery Method: Nasal cannula Placement Confirmation: positive ETCO2       

## 2020-07-23 NOTE — Op Note (Signed)
Preoperative Diagnosis:  1. Visually significant blepharoptosis bilateral  Upper Eyelid(s) 2. Visually significant dermatochalasis bilateral  Upper Eyelid(s)  Postoperative Diagnosis:  Same.  Procedure(s) Performed:   1. Blepharoptosis repair with levator aponeurosis advancement bilateral  Upper Eyelid(s) 2. Upper eyelid blepharoplasty with excess skin excision  bilateral  Upper Eyelid(s)  Surgeon: Philis Pique. Vickki Muff, M.D.  Assistants: none  Anesthesia: MAC  Specimens: None.  Estimated Blood Loss: Minimal.  Complications: None.  Operative Findings: None Dictated  Procedure:   Allergies were reviewed and the patient Patient has no known allergies..   After the risks, benefits, complications and alternatives were discussed with the patient, appropriate informed consent was obtained.  While seated in an upright position and looking in primary gaze, the mid pupillary line was marked on the upper eyelid margins bilaterally. The patient was then brought to the operating suite and reclined supine.  Timeout was conducted and the patient was sedated.  Local anesthetic consisting of a 50-50 mixture of 2% lidocaine with epinephrine and 0.75% bupivacaine with added Hylenex was injected subcutaneously to both  upper eyelid(s). After adequate local was instilled, the patient was prepped and draped in the usual sterile fashion for eyelid surgery.   Attention was turned to the upper eyelids. A 47m upper eyelid crease incision line was marked with calipers on both  upper eyelid(s).  A pinch test was used to estimate the amount of excess skin to remove and this was marked in standard blepharoplasty style fashion. Attention was turned to the  right  upper eyelid. A #15 blade was used to open the premarked incision line. A Skin only flap was excised and hemostasis was obtained with bipolar cautery.   Westcott scissors were then used to transect through orbicularis down to the tarsal plate. Epitarsus was  dissected to create a smooth surface to suture to. Dissection was then carried superiorly in the plane between orbicularis and orbital septum. Once the preaponeurotic fat pocket was identified, the orbital septum was opened. This revealed the levator and its aponeurosis.    Attention was then turned to the opposite eyelid where the same procedure was performed in the same manner. Hemostasis was obtained with bipolar cautery throughout.   3 interrupted 6-0 Prolene sutures were then passed partial thickness through the tarsal plates of both  upper eyelid(s). These sutures were placed in line with the mid pupillary, medial limbal, and lateral limbal lines. The sutures were fixed to the levator aponeurosis and adjusted until a nice lid height and contour were achieved. Once nice symmetry was achieved, the skin incisions were closed with a running 6-0 plain gut suture. The patient tolerated the procedure well.  Erythromycin ophthalmic ophthalmic ointment was applied to the incision site(s) followed by ice packs. The patient was taken to the recovery area where she recovered without difficulty.  Post-Op Plan/Instructions:   The patient was instructed to use ice packs frequently for the next 48 hours. She was instructed to use Maxitrol ophthalmic ointment on her incisions 4 times a day for the next 12 to 14 days. Shewas given a prescription for tramadol (or similar) for pain control should Tylenol not be effective. [facility] [place] or sooner as needed for problems.  Janene Yousuf M. FVickki Muff M.D. Ophthalmology

## 2020-07-23 NOTE — Anesthesia Postprocedure Evaluation (Signed)
Anesthesia Post Note  Patient: Kristi Lee  Procedure(s) Performed: BLEPHAROPLASTY UPPER EYELID; W/EXCESS SKIN BLEPHAROPTOSIS REPAIR; RESECT EX BILATERAL (Bilateral )     Patient location during evaluation: PACU Anesthesia Type: MAC Level of consciousness: awake and alert and oriented Pain management: satisfactory to patient Vital Signs Assessment: post-procedure vital signs reviewed and stable Respiratory status: spontaneous breathing, nonlabored ventilation and respiratory function stable Cardiovascular status: blood pressure returned to baseline and stable Postop Assessment: Adequate PO intake and No signs of nausea or vomiting Anesthetic complications: no   No complications documented.  Raliegh Ip

## 2020-07-23 NOTE — Interval H&P Note (Signed)
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  The various methods of treatment have been discussed with the patient and family. After consideration of risks, benefits and other options for treatment, the patient has consented to  Procedure(s): BLEPHAROPLASTY UPPER EYELID; W/EXCESS SKIN BLEPHAROPTOSIS REPAIR; RESECT EX BILATERAL (Bilateral) as a surgical intervention.  The patient's history has been reviewed, patient examined, no change in status, stable for surgery.  I have reviewed the patient's chart and labs.  Questions were answered to the patient's satisfaction.     Vickki Muff, Ricca Melgarejo M

## 2020-07-23 NOTE — Transfer of Care (Signed)
Immediate Anesthesia Transfer of Care Note  Patient: Kristi Lee  Procedure(s) Performed: BLEPHAROPLASTY UPPER EYELID; W/EXCESS SKIN BLEPHAROPTOSIS REPAIR; RESECT EX BILATERAL (Bilateral )  Patient Location: PACU  Anesthesia Type: MAC  Level of Consciousness: awake, alert  and patient cooperative  Airway and Oxygen Therapy: Patient Spontanous Breathing and Patient connected to supplemental oxygen  Post-op Assessment: Post-op Vital signs reviewed, Patient's Cardiovascular Status Stable, Respiratory Function Stable, Patent Airway and No signs of Nausea or vomiting  Post-op Vital Signs: Reviewed and stable  Complications: No complications documented.

## 2020-07-23 NOTE — Anesthesia Preprocedure Evaluation (Signed)
Anesthesia Evaluation  Patient identified by MRN, date of birth, ID band Patient awake    Reviewed: Allergy & Precautions, H&P , NPO status , Patient's Chart, lab work & pertinent test results  Airway Mallampati: II  TM Distance: >3 FB Neck ROM: full    Dental no notable dental hx.    Pulmonary shortness of breath and with exertion,  Currently being worked up for OSA, and new pulmonary findings on CT.   Pulmonary exam normal breath sounds clear to auscultation       Cardiovascular hypertension, Normal cardiovascular exam Rhythm:regular Rate:Normal     Neuro/Psych PSYCHIATRIC DISORDERS    GI/Hepatic GERD  ,  Endo/Other  Morbid obesity  Renal/GU      Musculoskeletal   Abdominal   Peds  Hematology   Anesthesia Other Findings   Reproductive/Obstetrics                             Anesthesia Physical Anesthesia Plan  ASA: III  Anesthesia Plan: MAC   Post-op Pain Management:    Induction:   PONV Risk Score and Plan: 2 and Treatment may vary due to age or medical condition, TIVA and Midazolam  Airway Management Planned:   Additional Equipment:   Intra-op Plan:   Post-operative Plan:   Informed Consent: I have reviewed the patients History and Physical, chart, labs and discussed the procedure including the risks, benefits and alternatives for the proposed anesthesia with the patient or authorized representative who has indicated his/her understanding and acceptance.     Dental Advisory Given  Plan Discussed with: CRNA  Anesthesia Plan Comments:         Anesthesia Quick Evaluation

## 2020-07-23 NOTE — H&P (Signed)
  See the history and physical completed at [facility] on 07/16/2020 and scanned into the chart.

## 2020-07-24 ENCOUNTER — Encounter: Payer: Self-pay | Admitting: Ophthalmology

## 2020-07-24 LAB — QUANTIFERON-TB GOLD PLUS (RQFGPL)
QuantiFERON Mitogen Value: 0.89 IU/mL
QuantiFERON Nil Value: 0 IU/mL
QuantiFERON TB1 Ag Value: 0 IU/mL
QuantiFERON TB2 Ag Value: 0 IU/mL

## 2020-07-24 LAB — QUANTIFERON-TB GOLD PLUS: QuantiFERON-TB Gold Plus: NEGATIVE

## 2020-07-24 LAB — CYCLIC CITRUL PEPTIDE ANTIBODY, IGG/IGA: CCP Antibodies IgG/IgA: 5 units (ref 0–19)

## 2020-07-27 LAB — HYPERSENSITIVITY PNEUMONITIS
A. Pullulans Abs: NEGATIVE
A.Fumigatus #1 Abs: NEGATIVE
Micropolyspora faeni, IgG: NEGATIVE
Pigeon Serum Abs: NEGATIVE
Thermoact. Saccharii: NEGATIVE
Thermoactinomyces vulgaris, IgG: NEGATIVE

## 2020-07-28 ENCOUNTER — Telehealth: Payer: Self-pay | Admitting: Pulmonary Disease

## 2020-07-28 NOTE — Telephone Encounter (Signed)
Gave pt info for covid test.  Pt stated nothing further needed at this time.

## 2020-07-28 NOTE — Telephone Encounter (Signed)
Lm to relay date/time of covid test prior to PFT  07/31/2020 [facility]

## 2020-07-31 ENCOUNTER — Other Ambulatory Visit
Admission: RE | Admit: 2020-07-31 | Discharge: 2020-07-31 | Disposition: A | Discharge status: Home / Self Care | Payer: Medicare Other | Source: Ambulatory Visit | Attending: Internal Medicine | Admitting: Internal Medicine

## 2020-07-31 ENCOUNTER — Other Ambulatory Visit: Payer: Self-pay

## 2020-07-31 DIAGNOSIS — Z20822 Contact with and (suspected) exposure to covid-19: Secondary | ICD-10-CM | POA: Insufficient documentation

## 2020-07-31 DIAGNOSIS — Z01812 Encounter for preprocedural laboratory examination: Secondary | ICD-10-CM | POA: Insufficient documentation

## 2020-07-31 LAB — SARS CORONAVIRUS 2 (TAT 6-24 HRS): SARS Coronavirus 2: NEGATIVE

## 2020-08-02 ENCOUNTER — Other Ambulatory Visit: Payer: Self-pay | Admitting: Physician Assistant

## 2020-08-02 DIAGNOSIS — F4323 Adjustment disorder with mixed anxiety and depressed mood: Secondary | ICD-10-CM

## 2020-08-02 DIAGNOSIS — I10 Essential (primary) hypertension: Secondary | ICD-10-CM

## 2020-08-03 ENCOUNTER — Ambulatory Visit: Payer: Medicare Other | Attending: Internal Medicine

## 2020-08-03 ENCOUNTER — Other Ambulatory Visit: Payer: Self-pay

## 2020-08-03 DIAGNOSIS — R0602 Shortness of breath: Secondary | ICD-10-CM | POA: Diagnosis present

## 2020-08-03 MED ORDER — ALBUTEROL SULFATE (2.5 MG/3ML) 0.083% IN NEBU
2.5000 mg | INHALATION_SOLUTION | Freq: Once | RESPIRATORY_TRACT | Status: AC
  Administered 2020-08-03: 2.5 mg via RESPIRATORY_TRACT
  Filled 2020-08-03: qty 3

## 2020-08-10 ENCOUNTER — Other Ambulatory Visit: Payer: Managed Care, Other (non HMO)

## 2020-08-10 ENCOUNTER — Ambulatory Visit: Payer: Managed Care, Other (non HMO) | Admitting: Oncology

## 2020-08-17 ENCOUNTER — Telehealth: Payer: Self-pay

## 2020-08-17 NOTE — Telephone Encounter (Signed)
Copied from CRM [id number]. Topic: General - Inquiry >> Aug 17, 2020 10:08 AM Daphine Deutscher D wrote: Reason for CRM: Pt called asking if Antony Contras could print off all of her current  Prescriptions and let her come by the office and pick them up.  She has a new insurance and will need to find another pharmacy  CB#  [phone]

## 2020-08-19 ENCOUNTER — Encounter: Payer: Self-pay | Admitting: Primary Care

## 2020-08-19 ENCOUNTER — Other Ambulatory Visit: Payer: Self-pay

## 2020-08-19 ENCOUNTER — Telehealth: Payer: Self-pay | Admitting: Primary Care

## 2020-08-19 ENCOUNTER — Ambulatory Visit (INDEPENDENT_AMBULATORY_CARE_PROVIDER_SITE_OTHER): Payer: Medicare Other | Admitting: Primary Care

## 2020-08-19 DIAGNOSIS — R0602 Shortness of breath: Secondary | ICD-10-CM | POA: Insufficient documentation

## 2020-08-19 DIAGNOSIS — R0600 Dyspnea, unspecified: Secondary | ICD-10-CM

## 2020-08-19 DIAGNOSIS — R0609 Other forms of dyspnea: Secondary | ICD-10-CM

## 2020-08-19 LAB — PULMONARY FUNCTION TEST ARMC ONLY
DL/VA % pred: 85 %
DL/VA: 3.52 ml/min/mmHg/L
DLCO unc % pred: 58 %
DLCO unc: 12.37 ml/min/mmHg
FEF 25-75 Post: 1.03 L/sec
FEF 25-75 Pre: 0.96 L/sec
FEF2575-%Change-Post: 6 %
FEF2575-%Pred-Post: 45 %
FEF2575-%Pred-Pre: 42 %
FEV1-%Change-Post: -1 %
FEV1-%Pred-Post: 51 %
FEV1-%Pred-Pre: 52 %
FEV1-Post: 1.34 L
FEV1-Pre: 1.36 L
FEV1FVC-%Change-Post: 10 %
FEV1FVC-%Pred-Pre: 96 %
FEV6-%Change-Post: -10 %
FEV6-%Pred-Post: 50 %
FEV6-%Pred-Pre: 56 %
FEV6-Post: 1.64 L
FEV6-Pre: 1.84 L
FEV6FVC-%Pred-Post: 104 %
FEV6FVC-%Pred-Pre: 104 %
FVC-%Change-Post: -10 %
FVC-%Pred-Post: 48 %
FVC-%Pred-Pre: 53 %
FVC-Post: 1.64 L
FVC-Pre: 1.84 L
Post FEV1/FVC ratio: 82 %
Post FEV6/FVC ratio: 100 %
Pre FEV1/FVC ratio: 74 %
Pre FEV6/FVC Ratio: 100 %
RV % pred: 65 %
RV: 1.44 L
TLC % pred: 69 %
TLC: 3.71 L

## 2020-08-19 MED ORDER — BUDESONIDE-FORMOTEROL FUMARATE 80-4.5 MCG/ACT IN AERO: 2.0000 | INHALATION_SPRAY | Freq: Two times a day (BID) | RESPIRATORY_TRACT | 2 refills | Status: DC

## 2020-08-19 NOTE — Assessment & Plan Note (Addendum)
HRCT showed no evidence of ILD, there was some nodularity in upper lobes. ANA, rheumatoid factor, ANCA and hypersensitivity pneumonitis panel were negative. Her PFTs showed moderate restriction with borderline reversibility and moderate diffusion defect. We have sent in prescription to trial low dose ICS/LABA as her spirometry and symptoms appear somewhat consistent with asthma. She still needs ONO, if abnormal I will get HST. FU in 3 months with Dr. Marchelle Gearing.

## 2020-08-19 NOTE — Telephone Encounter (Signed)
I saw patient to review testing you ordered for dyspnea. HRCT showed no evidence of ILD, there was some nodularity in upper lobes. You ordered serology that mostly came back normal except sed rate was 44 and CK-MB was 6.6. Her PFTs showed moderate restriction with borderline reversibility and moderate diffusion defect. I sent in prescription for symb 80 to trial. She still needs ONO, if abnormal I will get HST.   Any further recommendations or concerns?

## 2020-08-19 NOTE — Progress Notes (Signed)
@Patient  ID: , female    DOB: [birth year], 65 y.o.   MRN: [id number]  Chief Complaint  Patient presents with  . Follow-up    Review CT and PFT--c/o sob with exertion.     Referring provider: [id number]*  HPI: 65 year old female, never smoked.  Past medical history significant for hypertension, DVT, GERD, scoliosis, lipidemia, insomnia, abnormal LFTs. Patient of Dr. 76, seen for initial consult for dyspnea on 07/08/2020.  Previous [facility] pulmonary encounter: 07/08/2020-Dr. 07/10/2020, consult Kristi Lee 65 y.o. - She reports insidious onset of shortness of breath the last 2 years.  She does not think it is progressive for the husband who is here with her thinks it is progressive.  She tells me that she can climb 1 flight of stairs but gets very tired and short of breath when she reaches 1 flight of stairs but she cannot do 2 flights of stairs.  She is able to do household work but she also gets short of breath with that.  Today when she parked the car and she came to the hospital after she had to the hospital she needed a wheelchair to come to the office because of shortness of breath.  Occasionally this wheezing with is no cough.  There is no orthopnea proximal nocturnal dyspnea.  There is no hemoptysis.  In the summer 2020 when she was diagnosed with bilateral DVT.  She is on Eliquis for that.  She had a CT scan of the chest at that time some pleural effusions are noted.  She had echocardiogram with ejection fraction systolic and diastolic were normal.  I reviewed the report.  I personally visualized the images.  Lab review also shows she has some anemia last checked in summer 2021.  Today when we walked her she got quite short of breath and stop but she did not desaturate at all.  She is up-to-date with her vaccines but is in need of a flu shot and also Covid booster.  The husband reports history of snoring at night but they are not sure about apneic  spells or excessive daytime somnolence   Simple office walk 185 feet x  3 laps goal with forehead probe 07/08/2020   O2 used ra  Number laps completed 2 of 3 due to dyspnea stopped  Comments about pace slow  Resting Pulse Ox/HR 100% and 82/min  Final Pulse Ox/HR 99% and 110/min  Desaturated </= 88% no  Desaturated <= 3% points no  Got Tachycardic >/= 90/min yes  Symptoms at end of test Dyspnea and tired  Miscellaneous comments Stopped prematures   Results for RIELYN, Lee (MRN [place] Pop) as of 07/08/2020 09:34  Ref. Range 01/19/2020 06:00 01/24/2020 20:23  Hemoglobin Latest Ref Range: 12.0 - 15.0 g/dL 9.5 (L) 03/25/2020 (L)   IMPRESSION: 1. No evidence of a pulmonary embolism. 2. Trace pleural effusions with interstitial thickening consistent with mild pulmonary edema. 3. No evidence pneumonia.   08/19/2020-interim history Patient presents today for 4 to 6-week follow-up.  During consult with Dr. 10/17/2020 for dyspnea symptoms patient was ordered for HRCT, labs and overnight oximetry test.    She reports getting out of breath when walking long distances. This has been going on for several years. She exercises daily. States that she does not get out of breath when walking in place. She occasionally has noticed wheezing. She has never smoked before. She tried 2 week sample Breo but did not notice a difference in  her breathing. Her husband has told her that she snore. No witnessed sleep apnea. She has not had overnight oximetry testing yet. High resolution CT imaging showed no evidence of ILD but some nodularity in both upper lungs.  She was ordered for additional serology lab testing.  ANA, rheumatoid factor, ANCA and hypersensitivity pneumonitis panel were negative. CK-MB and sed rate were elevated.  Pulmonary function testing showed moderately severe restrictive lung disease without sniffing and bronchodilator response. Moderate diffusion defect.   No Known  Allergies  Immunization History  Administered Date(s) Administered  . Fluad Quad(high Dose 65+) 07/08/2020  . Influenza Split 05/30/2013  . Moderna Sars-Covid-2 Vaccination 10/16/2019, 11/13/2019  . Tdap 08/01/2012    Past Medical History:  Diagnosis Date  . Arthritis    back  . Depression   . DVT (deep venous thrombosis) ([place]) 01/25/2020  . GERD (gastroesophageal reflux disease)   . Hypertension   . PONV (postoperative nausea and vomiting)   . Scoliosis    reports she has rods in her back    Tobacco History: Social History   Tobacco Use  Smoking Status Never Smoker  Smokeless Tobacco Never Used   Counseling given: Not Answered   Outpatient Medications Prior to Visit  Medication Sig Dispense Refill  . atenolol (TENORMIN) 25 MG tablet TAKE 1 TABLET BY MOUTH  DAILY 90 tablet 0  . betamethasone dipropionate 0.05 % cream Apply 1 application topically as needed.     . carbamazepine (TEGRETOL XR) 100 MG 12 hr tablet     . furosemide (LASIX) 40 MG tablet Take 1 tablet (40 mg total) by mouth daily. 90 tablet 3  . gabapentin (NEURONTIN) 400 MG capsule Take 400 mg by mouth 3 (three) times daily.    [name] neomycin-polymyxin-dexameth (MAXITROL) 0.1 % OINT Use a small amount on sutures 4 times a day for 2 weeks then change to twice a day aquaphor 3.5 g 0  . omeprazole (PRILOSEC) 20 MG capsule TAKE 1 CAPSULE BY MOUTH  DAILY 90 capsule 3  . traMADol (ULTRAM) 50 MG tablet Take 1 every 4-6 hours as needed for pain not controlled by Tylenol 6 tablet 0  . traZODone (DESYREL) 100 MG tablet TAKE 1 TABLET BY MOUTH AT  BEDTIME 90 tablet 1  . triamterene-hydrochlorothiazide (MAXZIDE-25) 37.5-25 MG tablet TAKE 1 TABLET BY MOUTH  DAILY 90 tablet 1  . venlafaxine XR (EFFEXOR-XR) 75 MG 24 hr capsule TAKE 1 CAPSULE BY MOUTH  DAILY 90 capsule 0  . XARELTO 20 MG TABS tablet TAKE 1 TABLET BY MOUTH  DAILY WITH SUPPER 90 tablet 3  . fexofenadine (ALLEGRA) 180 MG tablet Take 180 mg by mouth daily.  (Patient  not taking: No sig reported)    . fluticasone (FLONASE) 50 MCG/ACT nasal spray Place 2 sprays into both nostrils daily. (Patient not taking: No sig reported) 16 g 6  . hydrOXYzine (ATARAX/VISTARIL) 10 MG tablet Take 1 tablet (10 mg total) by mouth 3 (three) times daily as needed. (Patient not taking: No sig reported) 30 tablet 0  . metoprolol tartrate (LOPRESSOR) 100 MG tablet Take 1 tablet (100 mg) by mouth 2 hours prior to your Cardiac CT scan (Patient not taking: No sig reported) 1 tablet 0  . phentermine 30 MG capsule TAKE 1 CAPSULE BY MOUTH IN  THE MORNING (Patient not taking: No sig reported) 90 capsule 0  . potassium chloride (KLOR-CON) 10 MEQ tablet Take 1 tablet (10 mEq total) by mouth daily. (Patient not taking: No sig reported)  7 tablet 0  . potassium chloride SA (KLOR-CON) 20 MEQ tablet Take 1 tablet (20 mEq total) by mouth daily. (Patient not taking: No sig reported) 90 tablet 1  . tolterodine (DETROL) 2 MG tablet TAKE 1 TABLET BY MOUTH  TWICE DAILY (Patient not taking: Reported on 08/19/2020) 180 tablet 3  . fluticasone furoate-vilanterol (BREO ELLIPTA) 100-25 MCG/INH AEPB Inhale 1 puff into the lungs daily. (Patient not taking: No sig reported) 60 each 5   No facility-administered medications prior to visit.    Review of Systems  Review of Systems  Constitutional: Negative.   Respiratory: Positive for shortness of breath and wheezing.     Physical Exam  BP 110/74 (BP Location: Left Arm, Cuff Size: Normal)   Pulse 100   Temp (!) 96.6 F (35.9 C) (Temporal)   Ht 5\' 6"  (1.676 m)   Wt 189 lb 3.2 oz (85.8 kg)   SpO2 96%   BMI 30.54 kg/m  Physical Exam Constitutional:      Appearance: Normal appearance.  HENT:     Mouth/Throat:     Mouth: Mucous membranes are moist.     Pharynx: Oropharynx is clear.  Cardiovascular:     Rate and Rhythm: Normal rate and regular rhythm.  Pulmonary:     Comments: CTA Neurological:     General: No focal deficit present.     Mental  Status: She is alert and oriented to person, place, and time. Mental status is at baseline.  Psychiatric:        Mood and Affect: Mood normal.        Behavior: Behavior normal.        Thought Content: Thought content normal.        Judgment: Judgment normal.      Lab Results:  CBC    Component Value Date/Time   WBC 9.3 07/08/2020 1037   RBC 4.74 07/08/2020 1037   HGB 10.7 (L) 07/08/2020 1037   HGB 12.7 04/26/2019 0910   HCT 36.3 07/08/2020 1037   HCT 38.3 04/26/2019 0910   PLT 233 07/08/2020 1037   PLT 216 04/26/2019 0910   MCV 76.6 (L) 07/08/2020 1037   MCV 84 04/26/2019 0910   MCV 84 10/25/2011 1308   MCH 22.6 (L) 07/08/2020 1037   MCHC 29.5 (L) 07/08/2020 1037   RDW 18.5 (H) 07/08/2020 1037   RDW 13.1 04/26/2019 0910   RDW 15.2 (H) 10/25/2011 1308   LYMPHSABS 1.2 07/08/2020 1037   LYMPHSABS 2.0 04/26/2019 0910   MONOABS 1.0 07/08/2020 1037   EOSABS 0.2 07/08/2020 1037   EOSABS 0.2 04/26/2019 0910   BASOSABS 0.1 07/08/2020 1037   BASOSABS 0.1 04/26/2019 0910    BMET    Component Value Date/Time   NA 138 07/08/2020 1037   NA 142 02/03/2020 0932   NA 142 11/09/2011 0423   K 3.5 07/23/2020 0910   K 3.5 11/09/2011 0423   CL 95 (L) 07/08/2020 1037   CL 106 11/09/2011 0423   CO2 29 07/08/2020 1037   CO2 28 11/09/2011 0423   GLUCOSE 142 (H) 07/08/2020 1037   GLUCOSE 96 11/09/2011 0423   BUN 32 (H) 07/08/2020 1037   BUN 22 02/03/2020 0932   BUN 8 11/09/2011 0423   CREATININE 1.13 (H) 07/08/2020 1037   CREATININE 0.68 11/09/2011 0423   CALCIUM 9.2 07/08/2020 1037   CALCIUM 7.8 (L) 11/09/2011 0423   GFRNONAA 54 (L) 07/08/2020 1037   GFRNONAA >60 11/09/2011 0423   GFRAA 74 02/03/2020  0932   GFRAA >60 11/09/2011 0423    BNP    Component Value Date/Time   BNP 183.4 (H) 01/19/2020 0823    ProBNP No results found for: PROBNP  Imaging: CT Chest High Resolution  Result Date: 07/21/2020 CLINICAL DATA:  Shortness of breath EXAM: CT CHEST WITHOUT CONTRAST  TECHNIQUE: Multidetector CT imaging of the chest was performed following the standard protocol without intravenous contrast. High resolution imaging of the lungs, as well as inspiratory and expiratory imaging, was performed. COMPARISON:  01/19/2020 FINDINGS: Cardiovascular: No significant vascular findings. Normal heart size. No pericardial effusion. Mediastinum/Nodes: No enlarged mediastinal, hilar, or axillary lymph nodes. Small hiatal hernia. Thyroid gland, trachea, and esophagus demonstrate no significant findings. Lungs/Pleura: Fine centrilobular nodularity throughout the lungs, most concentrated in the lung apices. Dependent scarring predominantly of the left lung base. Mild lobular air trapping on expiratory phase imaging. No pleural effusion or pneumothorax. Upper Abdomen: No acute abnormality.  Gallstones. Musculoskeletal: No chest wall mass or suspicious bone lesions identified. Extensive, partially imaged posterior thoracolumbar rod fusion with dextroscoliosis of the thoracic spine. IMPRESSION: 1. No evidence of fibrotic interstitial lung disease. There is dependent scarring, predominantly of the left lung base, most consistent with sequelae of prior infection or aspiration. 2. Fine centrilobular nodularity throughout the lungs, most concentrated in the lung apices. This is nonspecific and infectious or inflammatory, most commonly seen in smoking-related respiratory bronchiolitis. Hypersensitivity pneumonitis could have this appearance if clinically suspected. 3. Mild lobular air trapping on expiratory phase imaging, suggestive of small airways disease. 4. Small hiatal hernia. 5. Cholelithiasis. Electronically Signed   By: Eddie Candle M.D.   On: 07/21/2020 10:49     Assessment & Plan:   Dyspnea on exertion HRCT showed no evidence of ILD, there was some nodularity in upper lobes. ANA, rheumatoid factor, ANCA and hypersensitivity pneumonitis panel were negative. Her PFTs showed moderate restriction  with borderline reversibility and moderate diffusion defect. We have sent in prescription to trial low dose ICS/LABA as her spirometry and symptoms appear somewhat consistent with asthma. She still needs ONO, if abnormal I will get HST. FU in 3 months with Dr. Chase Caller.    Martyn Ehrich, NP 08/19/2020

## 2020-08-19 NOTE — Telephone Encounter (Signed)
Pt is going to check and see what pharmacy she can use with her new insurance.  She will give [place] a call back with the information.   Thanks,   -Vernona Rieger

## 2020-08-19 NOTE — Patient Instructions (Signed)
Pulmonary function testing showed moderate restriction in your breathing which we can see with asthma or being overweight.   You imaging showed some air trapping indicative of small airway disease that we can also see with asthma. It did not show evidence of pulmomary fibrosis, there were small nodules upper lungs that are likely inflammatory. Your serology lab testing was reassuring for this.   Recommend trying a difference inhaler in place of Breo 100 called Symbicort. Printed off prescription, if not covered by insurance please let us know   Follow-up: 2-3 month with MR in [place]

## 2020-08-20 ENCOUNTER — Telehealth: Payer: Self-pay | Admitting: Primary Care

## 2020-08-20 NOTE — Telephone Encounter (Signed)
Called and spoke with pt and she stated that the symbicort was $140 when she went to pick this up.  BW told her to let her know if this was too expensive.  BW please advise. Thanks

## 2020-08-21 ENCOUNTER — Ambulatory Visit: Payer: Self-pay | Admitting: Oncology

## 2020-08-21 ENCOUNTER — Other Ambulatory Visit: Payer: Medicare Other

## 2020-08-21 ENCOUNTER — Telehealth: Payer: Self-pay | Admitting: Primary Care

## 2020-08-21 NOTE — Telephone Encounter (Signed)
Patient states that Symbicort is covered by insurance.   Bath, please advise if okay to prescribe? Thank you

## 2020-08-21 NOTE — Telephone Encounter (Signed)
I agree with plan. Let us see how symbicort works. Could consider a feno at followup  Appreciate your help/support  Will close encounter  Reply not needed

## 2020-08-21 NOTE — Telephone Encounter (Signed)
I would recommend she call her insurance and ask what ICS/LABA is covered on her plan (examples are wixella, advair, dulera)

## 2020-08-21 NOTE — Telephone Encounter (Signed)
Per research, it appears that Kristi Lee originally prescribed symbicort 80. Advised patient to contact pharmacy to obtain cost of medication.  Patient will call back if medicaiton is not affordable.  Nothing further needed at this time.

## 2020-08-21 NOTE — Telephone Encounter (Signed)
Called and spoke with Patient. Beth,NP's recommendations given. Patient stated understanding and will call back with preferred inhalers, after she speaks to her insurance company.

## 2020-08-25 ENCOUNTER — Inpatient Hospital Stay (HOSPITAL_BASED_OUTPATIENT_CLINIC_OR_DEPARTMENT_OTHER): Payer: Medicare Other | Admitting: Oncology

## 2020-08-25 ENCOUNTER — Inpatient Hospital Stay: Payer: Medicare Other | Attending: Oncology

## 2020-08-25 ENCOUNTER — Encounter: Payer: Self-pay | Admitting: Oncology

## 2020-08-25 VITALS — BP 132/77 | HR 87 | Temp 98.8°F | Resp 16 | Wt 188.0 lb

## 2020-08-25 DIAGNOSIS — D6851 Activated protein C resistance: Secondary | ICD-10-CM | POA: Insufficient documentation

## 2020-08-25 DIAGNOSIS — Z86718 Personal history of other venous thrombosis and embolism: Secondary | ICD-10-CM

## 2020-08-25 DIAGNOSIS — I824Y2 Acute embolism and thrombosis of unspecified deep veins of left proximal lower extremity: Secondary | ICD-10-CM | POA: Insufficient documentation

## 2020-08-25 DIAGNOSIS — Z7901 Long term (current) use of anticoagulants: Secondary | ICD-10-CM | POA: Insufficient documentation

## 2020-08-25 LAB — COMPREHENSIVE METABOLIC PANEL
ALT: 26 U/L (ref 0–44)
AST: 30 U/L (ref 15–41)
Albumin: 4 g/dL (ref 3.5–5.0)
Alkaline Phosphatase: 144 U/L — ABNORMAL HIGH (ref 38–126)
Anion gap: 11 (ref 5–15)
BUN: 23 mg/dL (ref 8–23)
CO2: 27 mmol/L (ref 22–32)
Calcium: 9.1 mg/dL (ref 8.9–10.3)
Chloride: 99 mmol/L (ref 98–111)
Creatinine, Ser: 1.13 mg/dL — ABNORMAL HIGH (ref 0.44–1.00)
GFR, Estimated: 54 mL/min — ABNORMAL LOW (ref >60)
Glucose, Bld: 112 mg/dL — ABNORMAL HIGH (ref 70–99)
Potassium: 4.1 mmol/L (ref 3.5–5.1)
Sodium: 137 mmol/L (ref 135–145)
Total Bilirubin: 0.4 mg/dL (ref 0.3–1.2)
Total Protein: 7.1 g/dL (ref 6.5–8.1)

## 2020-08-25 LAB — CBC WITH DIFFERENTIAL/PLATELET
Abs Immature Granulocytes: 0.1 10*3/uL — ABNORMAL HIGH (ref 0.00–0.07)
Basophils Absolute: 0.1 10*3/uL (ref 0.0–0.1)
Basophils Relative: 1 %
Eosinophils Absolute: 0.2 10*3/uL (ref 0.0–0.5)
Eosinophils Relative: 2 %
HCT: 36.1 % (ref 36.0–46.0)
Hemoglobin: 10.9 g/dL — ABNORMAL LOW (ref 12.0–15.0)
Immature Granulocytes: 1 %
Lymphocytes Relative: 14 %
Lymphs Abs: 1.1 10*3/uL (ref 0.7–4.0)
MCH: 22.9 pg — ABNORMAL LOW (ref 26.0–34.0)
MCHC: 30.2 g/dL (ref 30.0–36.0)
MCV: 76 fL — ABNORMAL LOW (ref 80.0–100.0)
Monocytes Absolute: 1.1 10*3/uL — ABNORMAL HIGH (ref 0.1–1.0)
Monocytes Relative: 14 %
Neutro Abs: 5.4 10*3/uL (ref 1.7–7.7)
Neutrophils Relative %: 68 %
Platelets: 227 10*3/uL (ref 150–400)
RBC: 4.75 MIL/uL (ref 3.87–5.11)
RDW: 19 % — ABNORMAL HIGH (ref 11.5–15.5)
WBC: 8 10*3/uL (ref 4.0–10.5)
nRBC: 0 % (ref 0.0–0.2)

## 2020-08-25 LAB — ANTITHROMBIN III: AntiThromb III Func: 113 % (ref 75–120)

## 2020-08-25 LAB — D-DIMER, QUANTITATIVE: D-Dimer, Quant: 0.56 ug/mL-FEU — ABNORMAL HIGH (ref 0.00–0.50)

## 2020-08-25 NOTE — Progress Notes (Signed)
Patient wants to know about what to do about Kristi Lee for the future and wants to discuss test results

## 2020-08-26 ENCOUNTER — Telehealth: Payer: Self-pay | Admitting: *Deleted

## 2020-08-26 LAB — BETA-2-GLYCOPROTEIN I ABS, IGG/M/A
Beta-2 Glyco I IgG: <9 GPI IgG units (ref 0–20)
Beta-2-Glycoprotein I IgA: <9 GPI IgA units (ref 0–25)
Beta-2-Glycoprotein I IgM: <9 GPI IgM units (ref 0–32)

## 2020-08-26 NOTE — Telephone Encounter (Signed)
-----   Message from Sindy Guadeloupe, MD sent at 08/26/2020  9:04 AM EST ----- Her d dimer is 560. I would recommend continuing xarelto and stop tegretol after discussing with physician who started it for her

## 2020-08-26 NOTE — Telephone Encounter (Signed)
Called pt and let her know that the D dimer was 560 and based on that she will need to continue xarelto but Dr. [name] wants you to discuss the tegretol with your doctor who put you on the tegretol about stopping.Kristi Lee

## 2020-08-26 NOTE — Progress Notes (Signed)
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 After about 10 days of receiving Covid vaccine patient noted to have increasing left lower extremity pain and swelling. Ultrasound of the left lower extremity showed extensive left lower extremity DVT involving left common femoral as well as popliteal and calf veins. No preceding history of trauma or surgery. Patient works from home but is fairly active. She is a lifelong non-smoker. Nohistory ofpregnancy losses. Reports that she may have had a blood clot in her leg back in her 20s but does not remember taking any blood thinners for the same. Also does not remember if she was on birth control at that time.Family history significant for DVT in her mother. Patient is currently on Xarelto and tolerating it well. She still has some ongoing left lower extremity pain but reports that it is slowly improving.Her appetite and weight have remained stable. Denies any other new aches and pains anywhere. She is up-to-date with her colonoscopy and mammograms.  Results of hypercoagulable work-up did not show any evidence of prothrombin gene mutation or evidence of antiphospholipid antibody syndrome. 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  Prothrombin gene mutation was negative.  Protein S panel,  Antithrombin III levels normal.   Interval history- Patient reports chronic fatigue.  She has shortness of breath on exertion.  She ambulates with a cane.  She has not had any falls or bleeding.  ECOG PS- 1 Pain scale- 0   Review of systems- Review of Systems  Constitutional: Positive for malaise/fatigue. Negative for chills, fever and weight loss.  HENT: Negative for congestion, ear discharge and nosebleeds.   Eyes: Negative for blurred vision.  Respiratory: Positive for shortness of breath. Negative for cough, hemoptysis, sputum production and wheezing.   Cardiovascular: Negative for chest pain, palpitations, orthopnea and claudication.  Gastrointestinal: Negative for abdominal pain, blood in stool, constipation, diarrhea, heartburn, melena, nausea and vomiting.  Genitourinary: Negative for dysuria, flank pain, frequency, hematuria and urgency.  Musculoskeletal: Negative for back pain, joint pain and myalgias.  Skin: Negative for rash.  Neurological: Negative for dizziness, tingling, focal weakness, seizures, weakness and headaches.  Endo/Heme/Allergies: Does not bruise/bleed easily.  Psychiatric/Behavioral: Negative for depression and suicidal ideas. The patient does not have insomnia.       No Known Allergies   Past Medical History:  Diagnosis Date  . Arthritis    back  . Depression   . DVT (deep venous thrombosis) ([place]) 01/25/2020  . GERD (gastroesophageal reflux disease)   . Hypertension   . PONV (postoperative nausea and vomiting)   . Scoliosis    reports she has rods in her back     Past Surgical History:  Procedure Laterality Date  . ABDOMINAL HYSTERECTOMY    . BACK SURGERY  10/2000  . BREAST EXCISIONAL BIOPSY Right 1990's   NEG  . BROW LIFT Bilateral 07/23/2020   Procedure: BLEPHAROPLASTY UPPER EYELID; W/EXCESS SKIN BLEPHAROPTOSIS REPAIR; RESECT  EX BILATERAL;  Surgeon: Karle Starch, MD;  Location: [facility];  Service: Ophthalmology;  Laterality:  Bilateral;  . COLONOSCOPY WITH PROPOFOL N/A 03/23/2020   Procedure: COLONOSCOPY WITH PROPOFOL;  Surgeon: Lucilla Lame, MD;  Location: [facility];  Service: Endoscopy;  Laterality: N/A;  priority 4  . FEMUR FRACTURE SURGERY Right 07/15/2009   Pertrochanteric femur fracture.  [name] HIP FRACTURE SURGERY Right   . PERIPHERAL VASCULAR THROMBECTOMY Left 11/11/2019   Procedure: PERIPHERAL VASCULAR THROMBECTOMY;  Surgeon: Algernon Huxley, MD;  Location: [facility];  Service: Cardiovascular;  Laterality: Left;  . POLYPECTOMY  03/23/2020   Procedure: POLYPECTOMY;  Surgeon: Lucilla Lame, MD;  Location: [facility];  Service: Endoscopy;;  . REVISION TOTAL HIP ARTHROPLASTY Left     Social History   Socioeconomic History  . Marital status: Married    Spouse name: Not on file  . Number of children: 1  . Years of education: HS  . Highest education level: Not on file  Occupational History  . Occupation: Industrial/product designer: [employer]  Tobacco Use  . Smoking status: Never Smoker  . Smokeless tobacco: Never Used  Vaping Use  . Vaping Use: Never used  Substance and Sexual Activity  . Alcohol use: No  . Drug use: No  . Sexual activity: Not on file  Other Topics Concern  . Not on file  Social History Narrative   Lives at home in private residence with husband   Social Determinants of Health   Financial Resource Strain: Not on file  Food Insecurity: Not on file  Transportation Needs: Not on file  Physical Activity: Not on file  Stress: Not on file  Social Connections: Not on file  Intimate Partner Violence: Not on file    Family History  Problem Relation Age of Onset  . Osteoarthritis Mother   . Lung cancer Father   . Diabetes Brother   . Breast cancer Neg Hx      Current Outpatient Medications:  .  atenolol (TENORMIN) 25 MG tablet, TAKE 1 TABLET BY MOUTH  DAILY, Disp: 90 tablet, Rfl: 0 .  betamethasone dipropionate 0.05 % cream, Apply 1 application  topically as needed. , Disp: , Rfl:  .  carbamazepine (TEGRETOL XR) 100 MG 12 hr tablet, Take 200 mg by mouth 2 (two) times daily., Disp: , Rfl:  .  furosemide (LASIX) 40 MG tablet, Take 1 tablet (40 mg total) by mouth daily., Disp: 90 tablet, Rfl: 3 .  gabapentin (NEURONTIN) 400 MG capsule, Take 400 mg by mouth 3 (three) times daily., Disp: , Rfl:  .  omeprazole (PRILOSEC) 20 MG capsule, TAKE 1 CAPSULE BY MOUTH  DAILY, Disp: 90 capsule, Rfl: 3 .  potassium chloride SA (KLOR-CON) 20 MEQ tablet, Take 1 tablet (20 mEq total) by mouth daily., Disp: 90 tablet, Rfl: 1 .  tolterodine (DETROL) 2 MG tablet, TAKE 1 TABLET BY MOUTH  TWICE DAILY, Disp: 180 tablet, Rfl: 3 .  traZODone (DESYREL) 100 MG tablet, TAKE 1 TABLET BY MOUTH AT  BEDTIME, Disp: 90 tablet, Rfl: 1 .  triamterene-hydrochlorothiazide (MAXZIDE-25) 37.5-25 MG tablet, TAKE 1 TABLET BY MOUTH  DAILY, Disp: 90 tablet, Rfl: 1 .  venlafaxine XR (EFFEXOR-XR) 75 MG 24 hr capsule, TAKE 1 CAPSULE BY MOUTH  DAILY, Disp: 90 capsule, Rfl: 0 .  XARELTO 20 MG TABS tablet, TAKE 1 TABLET BY MOUTH  DAILY WITH SUPPER, Disp: 90 tablet, Rfl: 3  Physical exam:  Vitals:  08/25/20 1334  BP: 132/77  Pulse: 87  Resp: 16  Temp: 98.8 F (37.1 C)  TempSrc: Oral  Weight: 188 lb (85.3 kg)   Physical Exam Constitutional:      Comments: Ambulates with a cane.  Eyes:     Extraocular Movements: EOM normal.     Pupils: Pupils are equal, round, and reactive to light.  Cardiovascular:     Rate and Rhythm: Normal rate and regular rhythm.     Heart sounds: Normal heart sounds.  Pulmonary:     Effort: Pulmonary effort is normal.     Breath sounds: Normal breath sounds.  Abdominal:     General: Bowel sounds are normal.     Palpations: Abdomen is soft.  Musculoskeletal:     Comments: Trace bilateral lower extremity edema  Skin:    General: Skin is warm and dry.  Neurological:     Mental Status: She is alert and oriented to person, place, and time.      CMP  Latest Ref Rng & Units 08/25/2020  Glucose 70 - 99 mg/dL 112(H)  BUN 8 - 23 mg/dL 23  Creatinine 0.44 - 1.00 mg/dL 1.13(H)  Sodium 135 - 145 mmol/L 137  Potassium 3.5 - 5.1 mmol/L 4.1  Chloride 98 - 111 mmol/L 99  CO2 22 - 32 mmol/L 27  Calcium 8.9 - 10.3 mg/dL 9.1  Total Protein 6.5 - 8.1 g/dL 7.1  Total Bilirubin 0.3 - 1.2 mg/dL 0.4  Alkaline Phos 38 - 126 U/L 144(H)  AST 15 - 41 U/L 30  ALT 0 - 44 U/L 26   CBC Latest Ref Rng & Units 08/25/2020  WBC 4.0 - 10.5 K/uL 8.0  Hemoglobin 12.0 - 15.0 g/dL 10.9(L)  Hematocrit 36.0 - 46.0 % 36.1  Platelets 150 - 400 K/uL 227    No images are attached to the encounter.  Pulmonary Function Test [facility] Only  Result Date: 08/19/2020 Spirometry Data Is Acceptable and Reproducible Moderate RESTRICTIVE LUNG  Disease without  Significant Broncho-Dilator Response Consider outpatient Pulmonary Consultation if needed Clinical Correlation Advised     Assessment and plan- Patient is a 65 y.o. female with unprovoked left lower extremity DVT here for routine follow-up  Beta-2 glycoprotein was not done in the past and we have repeated that today.  Patient had an unprovoked extensive left lower extremity DVT.  She has been on anticoagulation for about 6 months now.  We have checked D-dimer levels today.  Based on her HERDOO2 scoring: She gets one-point for age greater than or equal to 65 years and possibly 1.4 her BMI which is just at the cutoff of 30.  Even if the BMI is not considered as a risk factor, if her D-dimer comes back is greater than 250; she would get at least 2 risk points which would put her at a high risk of recurrent DVT/PE in the future and I would favor continuing anticoagulation indefinitely as long as there is no bleeding risk.  If D-dimer comes back as less than 250 stopping anticoagulation could be considered.  After the patient left the clinic her D-dimer did come back at 560 and I do favor continuing Xarelto at this time.  However I  noticed that patient is on carbamazepine for her neuropathic pain and I have asked her to get in touch with her provider who prescribed the medicine to see if she could be switched to an alternative medicine or stop carbamazepine altogether.  Carbamazepine has drug interaction with Xarelto  and can reduce concentrations of Xarelto and as such the combination should be avoided.  Other oral anticoagulations also have similar interactions with carbamazepine.  Patient verbalized understanding and will speak to her provider accordingly I will see her back in 6 months time with a CBC with differential   Visit Diagnosis 1. DVT, lower extremity, proximal, acute, left ([place])      Dr. Randa Evens, MD, MPH [facility] [id number] 08/26/2020 11:47 AM

## 2020-08-27 ENCOUNTER — Other Ambulatory Visit: Payer: Self-pay | Admitting: *Deleted

## 2020-08-27 ENCOUNTER — Telehealth: Payer: Self-pay | Admitting: *Deleted

## 2020-08-27 LAB — PROTEIN S PANEL
Protein S Activity: 89 % (ref 63–140)
Protein S Ag, Free: 135 % (ref 61–136)
Protein S Ag, Total: 124 % (ref 60–150)

## 2020-08-27 MED ORDER — RIVAROXABAN 20 MG PO TABS: 20.0000 mg | ORAL_TABLET | Freq: Every day | ORAL | 0 refills | Status: DC

## 2020-08-27 NOTE — Telephone Encounter (Signed)
Called pt back and she did get my message about staying on xarelto and talkin gto MD that prescribes tegretol to possibly take her off that. She has a paper and will bring it tomorrow and md to sign to ger cheaper price for xarelto

## 2020-08-27 NOTE — Telephone Encounter (Signed)
Plan is to continue xarelto not stop it. Stop tegretol of possible after speaking to her physician who prescribed it. Not sure what is the issue with the cost?

## 2020-08-27 NOTE — Telephone Encounter (Signed)
Patient called reporting that she was told to stop her Xarelto, but that she has a paper where she can get it cheaper and wants to discuss this Please return her call [phone]

## 2020-08-28 LAB — PROTEIN C, TOTAL: Protein C, Total: 128 % (ref 60–150)

## 2020-09-15 ENCOUNTER — Telehealth: Payer: Self-pay

## 2020-09-15 NOTE — Telephone Encounter (Signed)
Patient called in and asked if Dr. [name] would switch her from xarelto to pradaxa, due to the cost of [place]. Per Dr.Rao she would prefer her to go on eliquis, if that is not cost effective then she can go on 150 mg bid pradaxa. called pt back and let her know, pt states she will find out the cost of eliquis and call back later this week to  Follow up. SJC

## 2020-10-27 ENCOUNTER — Telehealth: Payer: Self-pay | Admitting: Physician Assistant

## 2020-10-27 DIAGNOSIS — R350 Frequency of micturition: Secondary | ICD-10-CM

## 2020-10-27 DIAGNOSIS — I824Y2 Acute embolism and thrombosis of unspecified deep veins of left proximal lower extremity: Secondary | ICD-10-CM

## 2020-10-27 DIAGNOSIS — K219 Gastro-esophageal reflux disease without esophagitis: Secondary | ICD-10-CM

## 2020-10-27 DIAGNOSIS — I10 Essential (primary) hypertension: Secondary | ICD-10-CM

## 2020-10-27 DIAGNOSIS — E876 Hypokalemia: Secondary | ICD-10-CM

## 2020-10-27 DIAGNOSIS — F4323 Adjustment disorder with mixed anxiety and depressed mood: Secondary | ICD-10-CM

## 2020-10-27 DIAGNOSIS — F5101 Primary insomnia: Secondary | ICD-10-CM

## 2020-10-27 MED ORDER — POTASSIUM CHLORIDE CRYS ER 20 MEQ PO TBCR
20.0000 meq | EXTENDED_RELEASE_TABLET | Freq: Every day | ORAL | 1 refills | Status: DC
Start: 2020-10-27 — End: 2021-01-18

## 2020-10-27 MED ORDER — RIVAROXABAN 20 MG PO TABS: 20.0000 mg | ORAL_TABLET | Freq: Every day | ORAL | 1 refills | Status: DC

## 2020-10-27 MED ORDER — VENLAFAXINE HCL ER 75 MG PO CP24: 75.0000 mg | ORAL_CAPSULE | Freq: Every day | ORAL | 1 refills | Status: DC

## 2020-10-27 MED ORDER — FUROSEMIDE 40 MG PO TABS: 40.0000 mg | ORAL_TABLET | Freq: Every day | ORAL | 1 refills | Status: DC

## 2020-10-27 MED ORDER — ATENOLOL 25 MG PO TABS: 25.0000 mg | ORAL_TABLET | Freq: Every day | ORAL | 1 refills | Status: DC

## 2020-10-27 MED ORDER — TRAZODONE HCL 100 MG PO TABS: 100.0000 mg | ORAL_TABLET | Freq: Every day | ORAL | 1 refills | Status: DC

## 2020-10-27 MED ORDER — TRIAMTERENE-HCTZ 37.5-25 MG PO TABS: 1.0000 | ORAL_TABLET | Freq: Every day | ORAL | 1 refills | Status: DC

## 2020-10-27 MED ORDER — TOLTERODINE TARTRATE 2 MG PO TABS: 2.0000 mg | ORAL_TABLET | Freq: Two times a day (BID) | ORAL | 1 refills | Status: DC

## 2020-10-27 MED ORDER — OMEPRAZOLE 20 MG PO CPDR: 20.0000 mg | DELAYED_RELEASE_CAPSULE | Freq: Every day | ORAL | 1 refills | Status: DC

## 2020-10-27 NOTE — Telephone Encounter (Signed)
Patient called to ask if her medications could be sent to [facility], which has a pharmacy within their facility.  She wanted to know if this is possible for all her medications.  Please call patient to discuss further.  CB# [phone]

## 2020-10-27 NOTE — Telephone Encounter (Signed)
Please review.  Does she have to be established with [facility]?  Thanks,   -Mickel Baas

## 2020-10-27 NOTE — Telephone Encounter (Signed)
I am not sure. I can send meds and they will let her know if they can fill

## 2021-01-18 ENCOUNTER — Other Ambulatory Visit: Payer: Self-pay

## 2021-01-18 ENCOUNTER — Encounter: Payer: Self-pay | Admitting: Emergency Medicine

## 2021-01-18 ENCOUNTER — Ambulatory Visit
Admission: EM | Admit: 2021-01-18 | Discharge: 2021-01-18 | Disposition: A | Discharge status: Home / Self Care | Payer: Medicare Other | Attending: Physician Assistant | Admitting: Physician Assistant

## 2021-01-18 DIAGNOSIS — Z20822 Contact with and (suspected) exposure to covid-19: Secondary | ICD-10-CM | POA: Insufficient documentation

## 2021-01-18 DIAGNOSIS — J069 Acute upper respiratory infection, unspecified: Secondary | ICD-10-CM | POA: Diagnosis not present

## 2021-01-18 DIAGNOSIS — J040 Acute laryngitis: Secondary | ICD-10-CM | POA: Insufficient documentation

## 2021-01-18 MED ORDER — BENZONATATE 200 MG PO CAPS: 200.0000 mg | ORAL_CAPSULE | Freq: Two times a day (BID) | ORAL | 0 refills | Status: DC | PRN

## 2021-01-18 NOTE — Discharge Instructions (Addendum)
Do not use tap water, only boiled water that has been cooled down.  Do it twice a day  for 5-7 days, but avoid bed time.  Get Airborn and take as directed to help you boost your immunte system to help you heal  Floanse over the counter 2 sprays each nostril daily can help with post nasal drainge.   Stay quarantined until your Covid test is back.

## 2021-01-18 NOTE — ED Provider Notes (Signed)
[facility]    CSN: [id number] Arrival date & time: 01/18/21  1214      History   Chief Complaint Chief Complaint  Patient presents with  . Cough  . Nasal Congestion    HPI Kristi Lee is a 65 y.o. female who presents with onset of cough and nose congestion x 4 days. Denies fever. Appetite is normal .Has been a little fatigued.     Past Medical History:  Diagnosis Date  . Arthritis    back  . Depression   . DVT (deep venous thrombosis) ([place]) 01/25/2020  . GERD (gastroesophageal reflux disease)   . Hypertension   . PONV (postoperative nausea and vomiting)   . Scoliosis    reports she has rods in her back    Patient Active Problem List   Diagnosis Date Noted  . Dyspnea on exertion 08/19/2020  . Special screening for malignant neoplasms, colon   . Polyp of transverse colon   . DVT (deep venous thrombosis) ([place]) 11/08/2019  . Burning sensation 10/17/2019  . Itchy scalp 10/17/2019  . Occipital neuralgia of right side 10/17/2019  . Tingling 10/17/2019  . Hyperlipidemia, mixed 10/10/2016  . Stable angina pectoris ([place]) 10/10/2016  . Adaptation reaction 01/27/2015  . Back pain, chronic 01/27/2015  . CN (constipation) 01/27/2015  . Abnormal LFTs 01/27/2015  . Benign essential HTN 01/27/2015  . Gastro-esophageal reflux disease without esophagitis 01/27/2015  . Personal history of arthritis 01/27/2015  . Insomnia 01/27/2015  . Scoliosis 01/27/2015  . FOM (frequency of micturition) 01/27/2015    Past Surgical History:  Procedure Laterality Date  . ABDOMINAL HYSTERECTOMY    . BACK SURGERY  10/2000  . BREAST EXCISIONAL BIOPSY Right 1990's   NEG  . BROW LIFT Bilateral 07/23/2020   Procedure: BLEPHAROPLASTY UPPER EYELID; W/EXCESS SKIN BLEPHAROPTOSIS REPAIR; RESECT EX BILATERAL;  Surgeon: Karle Starch, MD;  Location: [place];  Service: Ophthalmology;  Laterality: Bilateral;  . COLONOSCOPY WITH PROPOFOL N/A 03/23/2020   Procedure:  COLONOSCOPY WITH PROPOFOL;  Surgeon: Lucilla Lame, MD;  Location: [place];  Service: Endoscopy;  Laterality: N/A;  priority 4  . FEMUR FRACTURE SURGERY Right 07/15/2009   Pertrochanteric femur fracture.  [name] HIP FRACTURE SURGERY Right   . PERIPHERAL VASCULAR THROMBECTOMY Left 11/11/2019   Procedure: PERIPHERAL VASCULAR THROMBECTOMY;  Surgeon: Algernon Huxley, MD;  Location: [place] [facility];  Service: Cardiovascular;  Laterality: Left;  . POLYPECTOMY  03/23/2020   Procedure: POLYPECTOMY;  Surgeon: Lucilla Lame, MD;  Location: [place];  Service: Endoscopy;;  . REVISION TOTAL HIP ARTHROPLASTY Left     OB History    Gravida  1   Para  1   Term      Preterm      AB      Living        SAB      IAB      Ectopic      Multiple      Live Births               Home Medications    Prior to Admission medications   Medication Sig Start Date End Date Taking? Authorizing Provider  atenolol (TENORMIN) 25 MG tablet Take 1 tablet (25 mg total) by mouth daily. 10/27/20  Yes Mar Daring, PA-C  benzonatate (TESSALON) 200 MG capsule Take 1 capsule (200 mg total) by mouth 2 (two) times daily as needed for cough. 01/18/21  Yes Rodriguez-Southworth, Sunday Spillers,  PA-C  betamethasone dipropionate 0.05 % cream Apply 1 application topically as needed.    Yes [provider]  furosemide (LASIX) 40 MG tablet Take 1 tablet (40 mg total) by mouth daily. 10/27/20  Yes Mar Daring, PA-C  gabapentin (NEURONTIN) 400 MG capsule Take 400 mg by mouth 3 (three) times daily. 12/31/19  Yes [provider]  omeprazole (PRILOSEC) 20 MG capsule Take 1 capsule (20 mg total) by mouth daily. 10/27/20  Yes Mar Daring, PA-C  rivaroxaban (XARELTO) 20 MG TABS tablet Take 1 tablet (20 mg total) by mouth daily with supper. 10/27/20  Yes Mar Daring, PA-C  tolterodine (DETROL) 2 MG tablet Take 1 tablet (2 mg total) by mouth 2 (two) times daily. 10/27/20  Yes  Mar Daring, PA-C  traZODone (DESYREL) 100 MG tablet Take 1 tablet (100 mg total) by mouth at bedtime. 10/27/20  Yes Mar Daring, PA-C  triamterene-hydrochlorothiazide (MAXZIDE-25) 37.5-25 MG tablet Take 1 tablet by mouth daily. 10/27/20  Yes Mar Daring, PA-C  venlafaxine XR (EFFEXOR-XR) 75 MG 24 hr capsule Take 1 capsule (75 mg total) by mouth daily. 10/27/20  Yes Fenton Malling M, PA-C  potassium chloride SA (KLOR-CON) 20 MEQ tablet Take 1 tablet (20 mEq total) by mouth daily. 10/27/20 01/18/21 Yes Mar Daring, PA-C  carbamazepine (TEGRETOL XR) 100 MG 12 hr tablet Take 200 mg by mouth 2 (two) times daily. 11/28/19 01/18/21  [provider]    Family History Family History  Problem Relation Age of Onset  . Osteoarthritis Mother   . Lung cancer Father   . Diabetes Brother   . Breast cancer Neg Hx     Social History Social History   Tobacco Use  . Smoking status: Never Smoker  . Smokeless tobacco: Never Used  Vaping Use  . Vaping Use: Never used  Substance Use Topics  . Alcohol use: No  . Drug use: No     Allergies   Patient has no known allergies.   Review of Systems Review of Systems  Constitutional: Positive for fatigue. Negative for activity change, appetite change, chills, diaphoresis and fever.  HENT: Positive for congestion, postnasal drip, rhinorrhea and voice change. Negative for ear discharge, ear pain and sore throat.   Eyes: Negative for discharge.  Respiratory: Positive for cough. Negative for chest tightness.   Musculoskeletal: Negative for myalgias.  Skin: Negative for rash.  Neurological: Negative for headaches.     Physical Exam Triage Vital Signs ED Triage Vitals [01/18/21 1304]  Enc Vitals Group     BP 131/75     Pulse Rate 79     Resp 18     Temp 98.2 F (36.8 C)     Temp Source Oral     SpO2 100 %     Weight 188 lb 0.8 oz (85.3 kg)     Height 5\' 6"  (1.676 m)     Head Circumference      Peak Flow       Pain Score 0     Pain Loc      Pain Edu?      Excl. in [place]?    No data found.  Updated Vital Signs BP 131/75 (BP Location: Right Arm)   Pulse 79   Temp 98.2 F (36.8 C) (Oral)   Resp 18   Ht 5\' 6"  (1.676 m)   Wt 188 lb 0.8 oz (85.3 kg)   SpO2 100%   BMI 30.35 kg/m  Visual Acuity Right Eye Distance:   Left Eye Distance:   Bilateral Distance:    Right Eye Near:   Left Eye Near:    Bilateral Near:     Physical Exam Physical Exam Vitals signs and nursing note reviewed.  Constitutional:      General: She is not in acute distress.    Appearance: Normal appearance. She is not ill-appearing, toxic-appearing or diaphoretic.  HENT: Voice is a little horsed    Head: Normocephalic.     Right Ear: Tympanic membrane, ear canal and external ear normal.     Left Ear: Tympanic membrane, ear canal and external ear normal.     Nose: Nose normal.     Mouth/Throat:     Mouth: Mucous membranes are moist.  Eyes:     General: No scleral icterus.       Right eye: No discharge.        Left eye: No discharge.     Conjunctiva/sclera: Conjunctivae normal.  Neck:     Musculoskeletal: Neck supple. No neck rigidity.  Cardiovascular:     Rate and Rhythm: Normal rate and regular rhythm.     Heart sounds: No murmur.  Pulmonary:     Effort: Pulmonary effort is normal.     Breath sounds: Normal breath sounds.  .  Musculoskeletal: Normal range of motion.  Lymphadenopathy:     Cervical: No cervical adenopathy.  Skin:    General: Skin is warm and dry.     Coloration: Skin is not jaundiced.     Findings: No rash.  Neurological:     Mental Status: She is alert and oriented to person, place, and time.     Gait: Gait normal.  Psychiatric:        Mood and Affect: Mood normal.        Behavior: Behavior normal.        Thought Content: Thought content normal.        Judgment: Judgment normal.     UC Treatments / Results  Labs (all labs ordered are listed, but only abnormal results are  displayed) Labs Reviewed  SARS CORONAVIRUS 2 (TAT 6-24 HRS)    EKG   Radiology No results found.  Procedures Procedures (including critical care time)  Medications Ordered in UC Medications - No data to display  Initial Impression / Assessment and Plan / UC Course  I have reviewed the triage vital signs and the nursing notes. Covid test is pending. Has URI. I placed her on Tessalon perless as noted. See instructions.   Final Clinical Impressions(s) / UC Diagnoses   Final diagnoses:  Laryngitis  Upper respiratory tract infection, unspecified type     Discharge Instructions     Do not use tap water, only boiled water that has been cooled down.  Do it twice a day  for 5-7 days, but avoid bed time.  Get Airborn and take as directed to help you boost your immunte system to help you heal  Floanse over the counter 2 sprays each nostril daily can help with post nasal drainge.   Stay quarantined until your Covid test is back.       ED Prescriptions    Medication Sig Dispense Auth. Provider   benzonatate (TESSALON) 200 MG capsule Take 1 capsule (200 mg total) by mouth 2 (two) times daily as needed for cough. 30 capsule Rodriguez-Southworth, Sunday Spillers, PA-C     PDMP not reviewed this encounter.   Shelby Mattocks, PA-C 01/18/21 1327

## 2021-01-18 NOTE — ED Triage Notes (Signed)
Patient c/o cough, nasal congestion that started on Thursday. Denies fever.

## 2021-01-19 LAB — SARS CORONAVIRUS 2 (TAT 6-24 HRS): SARS Coronavirus 2: NEGATIVE

## 2021-02-05 ENCOUNTER — Ambulatory Visit
Admission: RE | Admit: 2021-02-05 | Discharge: 2021-02-05 | Disposition: A | Discharge status: Home / Self Care | Payer: Medicare Other | Source: Ambulatory Visit | Attending: Sports Medicine | Admitting: Sports Medicine

## 2021-02-05 ENCOUNTER — Other Ambulatory Visit: Payer: Self-pay

## 2021-02-05 VITALS — BP 138/72 | HR 84 | Temp 98.7°F | Resp 18 | Ht 66.0 in | Wt 188.1 lb

## 2021-02-05 DIAGNOSIS — R0981 Nasal congestion: Secondary | ICD-10-CM

## 2021-02-05 DIAGNOSIS — R059 Cough, unspecified: Secondary | ICD-10-CM | POA: Diagnosis not present

## 2021-02-05 DIAGNOSIS — J0100 Acute maxillary sinusitis, unspecified: Secondary | ICD-10-CM | POA: Diagnosis not present

## 2021-02-05 DIAGNOSIS — J069 Acute upper respiratory infection, unspecified: Secondary | ICD-10-CM | POA: Diagnosis not present

## 2021-02-05 DIAGNOSIS — J3489 Other specified disorders of nose and nasal sinuses: Secondary | ICD-10-CM

## 2021-02-05 MED ORDER — FLUTICASONE PROPIONATE 50 MCG/ACT NA SUSP: 2.0000 | Freq: Every day | NASAL | 0 refills | Status: DC

## 2021-02-05 MED ORDER — AMOXICILLIN-POT CLAVULANATE 875-125 MG PO TABS: 1.0000 | ORAL_TABLET | Freq: Two times a day (BID) | ORAL | 0 refills | Status: DC

## 2021-02-05 MED ORDER — PROMETHAZINE-DM 6.25-15 MG/5ML PO SYRP: 5.0000 mL | ORAL_SOLUTION | Freq: Four times a day (QID) | ORAL | 0 refills | Status: DC | PRN

## 2021-02-05 NOTE — ED Triage Notes (Signed)
Pt c/o cough, nasal congestion. Started about 3 weeks ago. Denies fever. Pt was seen on June 6th and told to come back if she was not feeling better.

## 2021-02-05 NOTE — Discharge Instructions (Signed)
As we discussed, you have had your symptoms for 3 weeks.  Going to go ahead and treat you for presumed sinusitis.  Sent in an antibiotic.  Also sent in something for your cough.  Also renewed your Flonase for your nasal congestion. Please see educational handouts. Please make a follow-up appoint with your primary care provider when you leave here today and make sure that your situation has fully resolved. Tylenol or Motrin for any fever or discomfort. If symptoms worsen in any way please seek out care in the emergency room.

## 2021-02-05 NOTE — ED Provider Notes (Signed)
[facility]    CSN: [id number] Arrival date & time: 02/05/21  1051      History   Chief Complaint Chief Complaint  Patient presents with   Appointment   Nasal Congestion   Cough    HPI JAMONI BROADFOOT is a 65 y.o. female.   Pleasant 65 year old female who presents for evaluation of the above issues.  Gets her primary care needs met at the [facility] clinic.  She is retired.  She has had URI symptoms now for about 3 weeks.  This includes cough and nasal congestion.  No fever shakes chills.  She was seen on January 18, 2021 and given [employer]  No fever shakes chills.  She also has Flonase but she is out of that.  She is not sure the Tessalon Perles have helped much.  She is now developed a little bit of sinus pressure.  She has some postnasal drip.  She does not have a history of seasonal allergies.  She denies chest pain or shortness of breath.  No other issues or problems are offered.  No red flag signs or symptoms elicited on history.   Past Medical History:  Diagnosis Date   Arthritis    back   Depression    DVT (deep venous thrombosis) ([facility]) 01/25/2020   GERD (gastroesophageal reflux disease)    Hypertension    PONV (postoperative nausea and vomiting)    Scoliosis    reports she has rods in her back    Patient Active Problem List   Diagnosis Date Noted   Dyspnea on exertion 08/19/2020   Special screening for malignant neoplasms, colon    Polyp of transverse colon    DVT (deep venous thrombosis) ([place]) 11/08/2019   Burning sensation 10/17/2019   Itchy scalp 10/17/2019   Occipital neuralgia of right side 10/17/2019   Tingling 10/17/2019   Hyperlipidemia, mixed 10/10/2016   Stable angina pectoris ([place]) 10/10/2016   Adaptation reaction 01/27/2015   Back pain, chronic 01/27/2015   CN (constipation) 01/27/2015   Abnormal LFTs 01/27/2015   Benign essential HTN 01/27/2015   Gastro-esophageal reflux disease without esophagitis 01/27/2015   Personal  history of arthritis 01/27/2015   Insomnia 01/27/2015   Scoliosis 01/27/2015   FOM (frequency of micturition) 01/27/2015    Past Surgical History:  Procedure Laterality Date   ABDOMINAL HYSTERECTOMY     BACK SURGERY  10/2000   BREAST EXCISIONAL BIOPSY Right 1990's   NEG   BROW LIFT Bilateral 07/23/2020   Procedure: BLEPHAROPLASTY UPPER EYELID; W/EXCESS SKIN BLEPHAROPTOSIS REPAIR; RESECT EX BILATERAL;  Surgeon: Karle Starch, MD;  Location: [place];  Service: Ophthalmology;  Laterality: Bilateral;   COLONOSCOPY WITH PROPOFOL N/A 03/23/2020   Procedure: COLONOSCOPY WITH PROPOFOL;  Surgeon: Lucilla Lame, MD;  Location: [place];  Service: Endoscopy;  Laterality: N/A;  priority 4   FEMUR FRACTURE SURGERY Right 07/15/2009   Pertrochanteric femur fracture.   HIP FRACTURE SURGERY Right    PERIPHERAL VASCULAR THROMBECTOMY Left 11/11/2019   Procedure: PERIPHERAL VASCULAR THROMBECTOMY;  Surgeon: Algernon Huxley, MD;  Location: [place] [facility];  Service: Cardiovascular;  Laterality: Left;   POLYPECTOMY  03/23/2020   Procedure: POLYPECTOMY;  Surgeon: Lucilla Lame, MD;  Location: [facility];  Service: Endoscopy;;   REVISION TOTAL HIP ARTHROPLASTY Left     OB History     Gravida  1   Para  1   Term      Preterm  AB      Living         SAB      IAB      Ectopic      Multiple      Live Births               Home Medications    Prior to Admission medications   Medication Sig Start Date End Date Taking? Authorizing Provider  amoxicillin-clavulanate (AUGMENTIN) 875-125 MG tablet Take 1 tablet by mouth every 12 (twelve) hours. 02/05/21  Yes Verda Cumins, MD  atenolol (TENORMIN) 25 MG tablet Take 1 tablet (25 mg total) by mouth daily. 10/27/20  Yes Fenton Malling M, PA-C  betamethasone dipropionate 0.05 % cream Apply 1 application topically as needed.    Yes [provider]  fluticasone (FLONASE) 50 MCG/ACT nasal spray Place 2  sprays into both nostrils daily. 02/05/21  Yes Verda Cumins, MD  furosemide (LASIX) 40 MG tablet Take 1 tablet (40 mg total) by mouth daily. 10/27/20  Yes Mar Daring, PA-C  gabapentin (NEURONTIN) 400 MG capsule Take 400 mg by mouth 3 (three) times daily. 12/31/19  Yes [provider]  omeprazole (PRILOSEC) 20 MG capsule Take 1 capsule (20 mg total) by mouth daily. 10/27/20  Yes Mar Daring, PA-C  promethazine-dextromethorphan (PROMETHAZINE-DM) 6.25-15 MG/5ML syrup Take 5 mLs by mouth 4 (four) times daily as needed for cough. 02/05/21  Yes Verda Cumins, MD  rivaroxaban (XARELTO) 20 MG TABS tablet Take 1 tablet (20 mg total) by mouth daily with supper. 10/27/20  Yes Mar Daring, PA-C  tolterodine (DETROL) 2 MG tablet Take 1 tablet (2 mg total) by mouth 2 (two) times daily. 10/27/20  Yes Mar Daring, PA-C  traZODone (DESYREL) 100 MG tablet Take 1 tablet (100 mg total) by mouth at bedtime. 10/27/20  Yes Mar Daring, PA-C  triamterene-hydrochlorothiazide (MAXZIDE-25) 37.5-25 MG tablet Take 1 tablet by mouth daily. 10/27/20  Yes Mar Daring, PA-C  venlafaxine XR (EFFEXOR-XR) 75 MG 24 hr capsule Take 1 capsule (75 mg total) by mouth daily. 10/27/20  Yes Mar Daring, PA-C  benzonatate (TESSALON) 200 MG capsule Take 1 capsule (200 mg total) by mouth 2 (two) times daily as needed for cough. 01/18/21   Rodriguez-Southworth, Sunday Spillers, PA-C  carbamazepine (TEGRETOL XR) 100 MG 12 hr tablet Take 200 mg by mouth 2 (two) times daily. 11/28/19 01/18/21  [provider]  potassium chloride SA (KLOR-CON) 20 MEQ tablet Take 1 tablet (20 mEq total) by mouth daily. 10/27/20 01/18/21  Mar Daring, PA-C    Family History Family History  Problem Relation Age of Onset   Osteoarthritis Mother    Lung cancer Father    Diabetes Brother    Breast cancer Neg Hx     Social History Social History   Tobacco Use   Smoking status: Never   Smokeless  tobacco: Never  Vaping Use   Vaping Use: Never used  Substance Use Topics   Alcohol use: No   Drug use: No     Allergies   Patient has no known allergies.   Review of Systems Review of Systems  Constitutional:  Negative for activity change, appetite change, chills, diaphoresis, fatigue and fever.  HENT:  Positive for congestion, postnasal drip and sinus pressure. Negative for ear pain, rhinorrhea, sneezing and sore throat.   Eyes:  Negative for pain.  Respiratory:  Positive for cough. Negative for chest tightness and shortness of breath.   Cardiovascular:  Negative for chest pain and palpitations.  Gastrointestinal:  Negative for abdominal pain, diarrhea, nausea and vomiting.  Genitourinary:  Negative for dysuria.  Musculoskeletal:  Negative for back pain, myalgias and neck pain.  Skin:  Negative for color change, pallor, rash and wound.  Neurological:  Negative for dizziness, light-headedness, numbness and headaches.  All other systems reviewed and are negative.   Physical Exam Triage Vital Signs ED Triage Vitals  Enc Vitals Group     BP 02/05/21 1109 138/72     Pulse Rate 02/05/21 1109 84     Resp 02/05/21 1109 18     Temp 02/05/21 1109 98.7 F (37.1 C)     Temp Source 02/05/21 1109 Oral     SpO2 02/05/21 1109 99 %     Weight 02/05/21 1110 188 lb 0.8 oz (85.3 kg)     Height 02/05/21 1110 _0  (1.676 m)     Head Circumference --      Peak Flow --      Pain Score 02/05/21 1109 0     Pain Loc --      Pain Edu? --      Excl. in [place]? --    No data found.  Updated Vital Signs BP 138/72 (BP Location: Right Arm)   Pulse 84   Temp 98.7 F (37.1 C) (Oral)   Resp 18   Ht _1  (1.676 m)   Wt 85.3 kg   SpO2 99%   BMI 30.35 kg/m   Visual Acuity Right Eye Distance:   Left Eye Distance:   Bilateral Distance:    Right Eye Near:   Left Eye Near:    Bilateral Near:     Physical Exam Vitals and nursing note reviewed.  Constitutional:      General: She is not  in acute distress.    Appearance: Normal appearance. She is not ill-appearing, toxic-appearing or diaphoretic.  HENT:     Head: Normocephalic and atraumatic.     Right Ear: Tympanic membrane normal.     Left Ear: Tympanic membrane normal.     Nose: Congestion present. No rhinorrhea.     Right Turbinates: Enlarged and swollen.     Left Turbinates: Enlarged and swollen.     Right Sinus: Maxillary sinus tenderness present.     Left Sinus: Maxillary sinus tenderness present.     Mouth/Throat:     Mouth: Mucous membranes are moist.     Pharynx: No oropharyngeal exudate or posterior oropharyngeal erythema.  Eyes:     General: No scleral icterus.       Right eye: No discharge.        Left eye: No discharge.     Extraocular Movements: Extraocular movements intact.     Conjunctiva/sclera: Conjunctivae normal.     Pupils: Pupils are equal, round, and reactive to light.  Cardiovascular:     Rate and Rhythm: Normal rate and regular rhythm.     Pulses: Normal pulses.     Heart sounds: Normal heart sounds. No murmur heard.   No friction rub. No gallop.  Pulmonary:     Effort: Pulmonary effort is normal.     Breath sounds: Normal breath sounds. No stridor. No wheezing, rhonchi or rales.  Musculoskeletal:     Cervical back: Normal range of motion and neck supple. No rigidity or tenderness.  Lymphadenopathy:     Cervical: No cervical adenopathy.  Skin:    General: Skin is warm and dry.  Capillary Refill: Capillary refill takes less than 2 seconds.  Neurological:     General: No focal deficit present.     Mental Status: She is alert and oriented to person, place, and time.     UC Treatments / Results  Labs (all labs ordered are listed, but only abnormal results are displayed) Labs Reviewed - No data to display  EKG   Radiology No results found.  Procedures Procedures (including critical care time)  Medications Ordered in UC Medications - No data to display  Initial  Impression / Assessment and Plan / UC Course  I have reviewed the triage vital signs and the nursing notes.  Pertinent labs & imaging results that were available during my care of the patient were reviewed by me and considered in my medical decision making (see chart for details).  Clinical impression: 1.  3 weeks of upper respiratory infection symptoms including cough and nasal congestion. 2.  Subacute maxillary sinusitis with sinus pressure.  Treatment plan: 1.  The findings and treatment plan were discussed in detail with the patient.  Patient was in agreement. 2.  Given has been going on now for 3 weeks I will go ahead and give her a prescription for sinusitis.  Sent in Augmentin to the pharmacy. 3.  Sent in a prescription for cough medicine. 4.  Sent in a prescription for Flonase to help her with her nasal congestion and postnasal drip. 5.  Over-the-counter meds such as Tylenol or Motrin for any fever or discomfort. 6.  Educational handouts provided. 7.  If symptoms persist I want her to see her primary care provider make sure the situation is fully resolved. 8.  If she cannot get into see her PCP and her symptoms worsen she should go to the ER. 9.  She was stable on discharge and she will follow-up here as needed.    Final Clinical Impressions(s) / UC Diagnoses   Final diagnoses:  Subacute maxillary sinusitis  Cough  Upper respiratory tract infection, unspecified type  Sinus pressure  Nasal congestion     Discharge Instructions      As we discussed, you have had your symptoms for 3 weeks.  Going to go ahead and treat you for presumed sinusitis.  Sent in an antibiotic.  Also sent in something for your cough.  Also renewed your Flonase for your nasal congestion. Please see educational handouts. Please make a follow-up appoint with your primary care provider when you leave here today and make sure that your situation has fully resolved. Tylenol or Motrin for any fever or  discomfort. If symptoms worsen in any way please seek out care in the emergency room.     ED Prescriptions     Medication Sig Dispense Auth. Provider   amoxicillin-clavulanate (AUGMENTIN) 875-125 MG tablet Take 1 tablet by mouth every 12 (twelve) hours. 14 tablet Verda Cumins, MD   promethazine-dextromethorphan (PROMETHAZINE-DM) 6.25-15 MG/5ML syrup Take 5 mLs by mouth 4 (four) times daily as needed for cough. 180 mL Verda Cumins, MD   fluticasone [facility]) 50 MCG/ACT nasal spray Place 2 sprays into both nostrils daily. 15.8 mL Verda Cumins, MD      PDMP not reviewed this encounter.   Verda Cumins, MD 02/06/21 [id number]

## 2021-02-22 ENCOUNTER — Other Ambulatory Visit: Payer: Self-pay

## 2021-02-22 ENCOUNTER — Inpatient Hospital Stay: Payer: Medicare Other

## 2021-02-22 ENCOUNTER — Telehealth: Payer: Self-pay | Admitting: Oncology

## 2021-02-22 ENCOUNTER — Inpatient Hospital Stay: Payer: Medicare Other | Attending: Oncology | Admitting: Hospice and Palliative Medicine

## 2021-02-22 VITALS — BP 147/82 | HR 64 | Temp 98.4°F | Resp 16 | Wt 174.0 lb

## 2021-02-22 DIAGNOSIS — Z86718 Personal history of other venous thrombosis and embolism: Secondary | ICD-10-CM | POA: Diagnosis not present

## 2021-02-22 DIAGNOSIS — I824Y2 Acute embolism and thrombosis of unspecified deep veins of left proximal lower extremity: Secondary | ICD-10-CM

## 2021-02-22 DIAGNOSIS — Z7901 Long term (current) use of anticoagulants: Secondary | ICD-10-CM | POA: Insufficient documentation

## 2021-02-22 LAB — CBC WITH DIFFERENTIAL/PLATELET
Abs Immature Granulocytes: 0.22 10*3/uL — ABNORMAL HIGH (ref 0.00–0.07)
Basophils Absolute: 0.1 10*3/uL (ref 0.0–0.1)
Basophils Relative: 1 %
Eosinophils Absolute: 0.3 10*3/uL (ref 0.0–0.5)
Eosinophils Relative: 3 %
HCT: 37.9 % (ref 36.0–46.0)
Hemoglobin: 11.2 g/dL — ABNORMAL LOW (ref 12.0–15.0)
Immature Granulocytes: 2 %
Lymphocytes Relative: 20 %
Lymphs Abs: 2.3 10*3/uL (ref 0.7–4.0)
MCH: 22.6 pg — ABNORMAL LOW (ref 26.0–34.0)
MCHC: 29.6 g/dL — ABNORMAL LOW (ref 30.0–36.0)
MCV: 76.4 fL — ABNORMAL LOW (ref 80.0–100.0)
Monocytes Absolute: 1.3 10*3/uL — ABNORMAL HIGH (ref 0.1–1.0)
Monocytes Relative: 12 %
Neutro Abs: 6.9 10*3/uL (ref 1.7–7.7)
Neutrophils Relative %: 62 %
Platelets: 311 10*3/uL (ref 150–400)
RBC: 4.96 MIL/uL (ref 3.87–5.11)
RDW: 18.6 % — ABNORMAL HIGH (ref 11.5–15.5)
WBC: 11.1 10*3/uL — ABNORMAL HIGH (ref 4.0–10.5)
nRBC: 0 % (ref 0.0–0.2)

## 2021-02-22 NOTE — Telephone Encounter (Signed)
Called patient as the appointments that she left with today needed to be changed (updated LOS to RTC in 6 months). Patient agreeable. Sending updated AVS in mail.

## 2021-02-22 NOTE — Progress Notes (Signed)
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 Patient has received 1 dose of Covid vaccine.  After about 10 days of receiving Covid vaccine patient noted to have increasing left lower extremity pain and swelling.  Ultrasound of the left lower extremity showed extensive left lower extremity DVT involving left common femoral as well as popliteal and calf veins.  No preceding history of trauma or surgery.  Patient works from home but is fairly active.  She is a lifelong non-smoker.  No history of pregnancy losses.  Reports that she may have had a blood clot in her leg back in her 20s but does not remember taking any blood thinners for the same.  Also does not remember if she was on birth control at that time. Family history significant for DVT in her mother.    Results of hypercoagulable work-up did not show any evidence of prothrombin gene mutation or evidence of antiphospholipid antibody syndrome.  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  Prothrombin gene mutation was negative.  Protein S panel, Antithrombin III levels normal.   Interval history-patient reports doing well.  She denies any significant changes or concerns since last seen.  She did have recent URI but says that symptoms have resolved.  She denies any bleeding, dark or tarry stools, or falls.  She reports taking Xarelto as  directed without any reported adverse effects.  She has stopped the carbamazepine.  Patient is currently on Xarelto and tolerating it well.  Her appetite and weight have remained stable.  Denies any other new aches and pains anywhere.  No lower extremity swelling. She is up-to-date with her colonoscopy and mammograms.  ECOG PS- 1 Pain scale- 0   Review of systems- Review of Systems  Constitutional:  Negative for chills, fever, malaise/fatigue and weight loss.  HENT:  Negative for congestion, ear discharge and nosebleeds.   Eyes:  Negative for blurred vision.  Respiratory:  Negative for cough, hemoptysis, sputum production, shortness of breath and wheezing.   Cardiovascular:  Negative for chest pain, palpitations, orthopnea and claudication.  Gastrointestinal:  Negative for abdominal pain, blood in stool, constipation, diarrhea, heartburn, melena, nausea and vomiting.  Genitourinary:  Negative for dysuria, flank pain, frequency, hematuria and urgency.  Musculoskeletal:  Negative for back pain, joint pain and myalgias.  Skin:  Negative for rash.  Neurological:  Negative for dizziness, tingling, focal weakness, seizures, weakness and headaches.  Endo/Heme/Allergies:  Does not bruise/bleed easily.  Psychiatric/Behavioral:  Negative for depression and suicidal ideas. The patient does not have insomnia.      No Known Allergies   Past Medical History:  Diagnosis Date   Arthritis    back   Depression    DVT (deep venous thrombosis) (HCC) 01/25/2020   GERD (gastroesophageal reflux disease)    Hypertension    PONV (postoperative nausea and vomiting)    Scoliosis    reports she has rods in her back     Past Surgical History:  Procedure Laterality Date   ABDOMINAL HYSTERECTOMY     BACK SURGERY  10/2000   BREAST EXCISIONAL BIOPSY Right 1990's   NEG   BROW LIFT Bilateral 07/23/2020   Procedure: BLEPHAROPLASTY UPPER EYELID; W/EXCESS SKIN BLEPHAROPTOSIS REPAIR; RESECT EX BILATERAL;   Surgeon: Karle Starch, MD;  Location: [place];  Service: Ophthalmology;  Laterality: Bilateral;   COLONOSCOPY WITH PROPOFOL N/A 03/23/2020   Procedure: COLONOSCOPY WITH PROPOFOL;  Surgeon: Lucilla Lame, MD;  Location: [place];  Service: Endoscopy;  Laterality: N/A;  priority 4   FEMUR FRACTURE SURGERY Right 07/15/2009   Pertrochanteric femur fracture.   HIP FRACTURE SURGERY Right    PERIPHERAL VASCULAR THROMBECTOMY Left 11/11/2019   Procedure: PERIPHERAL VASCULAR THROMBECTOMY;  Surgeon: Algernon Huxley, MD;  Location: [place] [facility];  Service: Cardiovascular;  Laterality: Left;   POLYPECTOMY  03/23/2020   Procedure: POLYPECTOMY;  Surgeon: Lucilla Lame, MD;  Location: [facility];  Service: Endoscopy;;   REVISION TOTAL HIP ARTHROPLASTY Left     Social History   Socioeconomic History   Marital status: Married    Spouse name: Not on file   Number of children: 1   Years of education: HS   Highest education level: Not on file  Occupational History   Occupation: Industrial/product designer: [employer]  Tobacco Use   Smoking status: Never   Smokeless tobacco: Never  Vaping Use   Vaping Use: Never used  Substance and Sexual Activity   Alcohol use: No   Drug use: No   Sexual activity: Not on file  Other Topics Concern   Not on file  Social History Narrative   Lives at home in private residence with husband   Social Determinants of Health   Financial Resource Strain: Not on file  Food Insecurity: Not on file  Transportation Needs: Not on file  Physical Activity: Not on file  Stress: Not on file  Social Connections: Not on file  Intimate Partner Violence: Not on file    Family History  Problem Relation Age of Onset   Osteoarthritis Mother    Lung cancer Father    Diabetes Brother    Breast cancer Neg Hx      Current Outpatient Medications:    atenolol (TENORMIN) 25 MG tablet, Take 1 tablet (25 mg total) by mouth daily., Disp: 90 tablet,  Rfl: 1   betamethasone dipropionate 0.05 % cream, Apply 1 application topically as needed. , Disp: , Rfl:    furosemide (LASIX) 40 MG tablet, Take 1 tablet (40 mg total) by mouth daily., Disp: 90 tablet, Rfl: 1   omeprazole (PRILOSEC) 20 MG capsule, Take 1 capsule (20 mg total) by mouth daily., Disp: 90 capsule, Rfl: 1   rivaroxaban (XARELTO) 20 MG TABS tablet, Take 1 tablet (20 mg total) by mouth daily with supper., Disp: 90 tablet, Rfl: 1   tolterodine (DETROL) 2 MG tablet, Take 1 tablet (2 mg total) by mouth 2 (two) times daily., Disp: 180 tablet, Rfl: 1   traZODone (DESYREL) 100 MG tablet, Take 1 tablet (100 mg total) by mouth at bedtime., Disp: 90 tablet, Rfl: 1   triamterene-hydrochlorothiazide (MAXZIDE-25) 37.5-25 MG tablet, Take 1 tablet by mouth daily., Disp: 90 tablet, Rfl: 1   venlafaxine XR (EFFEXOR-XR) 75 MG 24 hr capsule, Take 1 capsule (75 mg total) by mouth daily., Disp: 90 capsule, Rfl: 1   amoxicillin-clavulanate (AUGMENTIN) 875-125 MG tablet, Take 1 tablet by mouth every 12 (twelve) hours. (Patient not  taking: Reported on 02/22/2021), Disp: 14 tablet, Rfl: 0   benzonatate (TESSALON) 200 MG capsule, Take 1 capsule (200 mg total) by mouth 2 (two) times daily as needed for cough. (Patient not taking: Reported on 02/22/2021), Disp: 30 capsule, Rfl: 0   fluticasone (FLONASE) 50 MCG/ACT nasal spray, Place 2 sprays into both nostrils daily. (Patient not taking: Reported on 02/22/2021), Disp: 15.8 mL, Rfl: 0   gabapentin (NEURONTIN) 400 MG capsule, Take 400 mg by mouth 3 (three) times daily. (Patient not taking: Reported on 02/22/2021), Disp: , Rfl:    promethazine-dextromethorphan (PROMETHAZINE-DM) 6.25-15 MG/5ML syrup, Take 5 mLs by mouth 4 (four) times daily as needed for cough. (Patient not taking: Reported on 02/22/2021), Disp: 180 mL, Rfl: 0  Physical exam:  Vitals:   02/22/21 0916  BP: (!) 147/82  Pulse: 64  Resp: 16  Temp: 98.4 F (36.9 C)  TempSrc: Tympanic  SpO2: 99%  Weight:  174 lb (78.9 kg)   Physical Exam Constitutional:      Comments: Ambulates with a cane.  Eyes:     Pupils: Pupils are equal, round, and reactive to light.  Cardiovascular:     Rate and Rhythm: Normal rate and regular rhythm.     Heart sounds: Normal heart sounds.  Pulmonary:     Effort: Pulmonary effort is normal.     Breath sounds: Normal breath sounds.  Abdominal:     General: Bowel sounds are normal.     Palpations: Abdomen is soft.  Musculoskeletal:     Comments: Trace bilateral lower extremity edema  Skin:    General: Skin is warm and dry.  Neurological:     Mental Status: She is alert and oriented to person, place, and time.     CMP Latest Ref Rng & Units 08/25/2020  Glucose 70 - 99 mg/dL 112(H)  BUN 8 - 23 mg/dL 23  Creatinine 0.44 - 1.00 mg/dL 1.13(H)  Sodium 135 - 145 mmol/L 137  Potassium 3.5 - 5.1 mmol/L 4.1  Chloride 98 - 111 mmol/L 99  CO2 22 - 32 mmol/L 27  Calcium 8.9 - 10.3 mg/dL 9.1  Total Protein 6.5 - 8.1 g/dL 7.1  Total Bilirubin 0.3 - 1.2 mg/dL 0.4  Alkaline Phos 38 - 126 U/L 144(H)  AST 15 - 41 U/L 30  ALT 0 - 44 U/L 26   CBC Latest Ref Rng & Units 02/22/2021  WBC 4.0 - 10.5 K/uL 11.1(H)  Hemoglobin 12.0 - 15.0 g/dL 11.2(L)  Hematocrit 36.0 - 46.0 % 37.9  Platelets 150 - 400 K/uL 311    No images are attached to the encounter.  No results found.    Assessment and plan- Patient is a 65 y.o. female with unprovoked left lower extremity DVT here for routine follow-up  Patient is doing well clinically.  CBC stable.  Patient did have borderline leukocytosis but likely secondary to recent URI.  Based on her HERDOO2 scoring: she is at a high risk of recurrent DVT/PE in the future and plan is to continue anticoagulation indefinitely as long as there is no bleeding risk. Patient has stopped the carbamazepine.  Case and plan discussed with Dr. [name] and patient to follow-up in 6 months for repeat CBC and to see MD.  Patient will call and to be seen sooner  with any changes or concerns.  Patient verbalized understanding and comfort with this plan.   Visit Diagnosis 1. History of recurrent deep vein thrombosis (DVT)      Altha Harm, NP-C  [facility] [phone] 02/22/2021 9:51 AM

## 2021-04-05 ENCOUNTER — Other Ambulatory Visit: Payer: Self-pay | Admitting: Family Medicine

## 2021-04-05 DIAGNOSIS — Z13820 Encounter for screening for osteoporosis: Secondary | ICD-10-CM

## 2021-05-06 ENCOUNTER — Ambulatory Visit: Payer: Medicare Other | Admitting: Cardiology

## 2021-05-17 ENCOUNTER — Other Ambulatory Visit: Payer: Self-pay | Admitting: Physician Assistant

## 2021-05-17 DIAGNOSIS — R350 Frequency of micturition: Secondary | ICD-10-CM

## 2021-05-23 ENCOUNTER — Encounter: Payer: Self-pay | Admitting: Emergency Medicine

## 2021-05-23 ENCOUNTER — Emergency Department
Admission: EM | Admit: 2021-05-23 | Discharge: 2021-05-23 | Disposition: A | Discharge status: Home / Self Care | Payer: Medicare Other | Attending: Emergency Medicine | Admitting: Emergency Medicine

## 2021-05-23 ENCOUNTER — Other Ambulatory Visit: Payer: Self-pay

## 2021-05-23 ENCOUNTER — Emergency Department: Payer: Medicare Other

## 2021-05-23 DIAGNOSIS — Z20822 Contact with and (suspected) exposure to covid-19: Secondary | ICD-10-CM | POA: Diagnosis not present

## 2021-05-23 DIAGNOSIS — D649 Anemia, unspecified: Secondary | ICD-10-CM | POA: Insufficient documentation

## 2021-05-23 DIAGNOSIS — I4510 Unspecified right bundle-branch block: Secondary | ICD-10-CM | POA: Diagnosis not present

## 2021-05-23 DIAGNOSIS — Z7901 Long term (current) use of anticoagulants: Secondary | ICD-10-CM | POA: Insufficient documentation

## 2021-05-23 DIAGNOSIS — I10 Essential (primary) hypertension: Secondary | ICD-10-CM | POA: Insufficient documentation

## 2021-05-23 DIAGNOSIS — R112 Nausea with vomiting, unspecified: Secondary | ICD-10-CM | POA: Diagnosis present

## 2021-05-23 LAB — COMPREHENSIVE METABOLIC PANEL
ALT: 26 U/L (ref 0–44)
AST: 32 U/L (ref 15–41)
Albumin: 4.5 g/dL (ref 3.5–5.0)
Alkaline Phosphatase: 171 U/L — ABNORMAL HIGH (ref 38–126)
Anion gap: 12 (ref 5–15)
BUN: 20 mg/dL (ref 8–23)
CO2: 29 mmol/L (ref 22–32)
Calcium: 9.5 mg/dL (ref 8.9–10.3)
Chloride: 99 mmol/L (ref 98–111)
Creatinine, Ser: 1.21 mg/dL — ABNORMAL HIGH (ref 0.44–1.00)
GFR, Estimated: 49 mL/min — ABNORMAL LOW (ref >60)
Glucose, Bld: 172 mg/dL — ABNORMAL HIGH (ref 70–99)
Potassium: 3.9 mmol/L (ref 3.5–5.1)
Sodium: 140 mmol/L (ref 135–145)
Total Bilirubin: 0.7 mg/dL (ref 0.3–1.2)
Total Protein: 7.7 g/dL (ref 6.5–8.1)

## 2021-05-23 LAB — URINALYSIS, COMPLETE (UACMP) WITH MICROSCOPIC
Bacteria, UA: NONE SEEN
Bilirubin Urine: NEGATIVE
Glucose, UA: NEGATIVE mg/dL
Hgb urine dipstick: NEGATIVE
Ketones, ur: NEGATIVE mg/dL
Leukocytes,Ua: NEGATIVE
Nitrite: NEGATIVE
Protein, ur: NEGATIVE mg/dL
Specific Gravity, Urine: 1.014 (ref 1.005–1.030)
pH: 6 (ref 5.0–8.0)

## 2021-05-23 LAB — CBC
HCT: 35.7 % — ABNORMAL LOW (ref 36.0–46.0)
Hemoglobin: 10.9 g/dL — ABNORMAL LOW (ref 12.0–15.0)
MCH: 22.5 pg — ABNORMAL LOW (ref 26.0–34.0)
MCHC: 30.5 g/dL (ref 30.0–36.0)
MCV: 73.8 fL — ABNORMAL LOW (ref 80.0–100.0)
Platelets: 278 10*3/uL (ref 150–400)
RBC: 4.84 MIL/uL (ref 3.87–5.11)
RDW: 17.4 % — ABNORMAL HIGH (ref 11.5–15.5)
WBC: 11.9 10*3/uL — ABNORMAL HIGH (ref 4.0–10.5)
nRBC: 0 % (ref 0.0–0.2)

## 2021-05-23 LAB — RESP PANEL BY RT-PCR (FLU A&B, COVID) ARPGX2
Influenza A by PCR: NEGATIVE
Influenza B by PCR: NEGATIVE
SARS Coronavirus 2 by RT PCR: NEGATIVE

## 2021-05-23 LAB — LIPASE, BLOOD: Lipase: 37 U/L (ref 11–51)

## 2021-05-23 MED ORDER — ONDANSETRON HCL 4 MG PO TABS: 4.0000 mg | ORAL_TABLET | Freq: Three times a day (TID) | ORAL | 0 refills | Status: DC | PRN

## 2021-05-23 MED ORDER — ONDANSETRON HCL 4 MG/2ML IJ SOLN
4.0000 mg | Freq: Once | INTRAMUSCULAR | Status: AC | PRN
  Administered 2021-05-23: 4 mg via INTRAVENOUS
  Filled 2021-05-23: qty 2

## 2021-05-23 MED ORDER — LACTATED RINGERS IV BOLUS
1000.0000 mL | Freq: Once | INTRAVENOUS | Status: AC
  Administered 2021-05-23: 1000 mL via INTRAVENOUS

## 2021-05-23 NOTE — ED Provider Notes (Signed)
[facility] Emergency Department Provider Note  ____________________________________________   Event Date/Time   First MD Initiated Contact with Patient 05/23/21 1630     (approximate)  I have reviewed the triage vital signs and the nursing notes.   HISTORY  Chief Complaint Emesis   HPI Kristi Lee is a 65 y.o. 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  Patient states she has a little bit of shortness of breath with exertion at baseline and is not sure if it is all worse or not today.  She denies any headache, earache, sore throat, cough, fevers, chest pain, abdominal pain, back pain, diarrhea, constipation, burning with urination, rash or extremity pain.  No focal weakness numbness or tingling.  No recent syncopal episodes or loss of consciousness.  No other recent sick symptoms.         Past Medical History:  Diagnosis Date   Arthritis    back   Depression    DVT (deep venous thrombosis) (HCC) 01/25/2020   GERD (gastroesophageal reflux disease)    Hypertension    PONV (postoperative nausea and vomiting)    Scoliosis    reports she has rods in her back    Patient Active Problem List   Diagnosis Date Noted   Dyspnea on exertion 08/19/2020   Special screening for malignant neoplasms, colon    Polyp of transverse colon    DVT (deep venous thrombosis) ([place]) 11/08/2019   Burning sensation 10/17/2019   Itchy scalp 10/17/2019   Occipital neuralgia of right side 10/17/2019   Tingling 10/17/2019   Hyperlipidemia, mixed 10/10/2016   Stable angina pectoris ([place]) 10/10/2016   Adaptation reaction 01/27/2015   Back pain, chronic 01/27/2015   CN (constipation) 01/27/2015   Abnormal LFTs 01/27/2015   Benign essential HTN 01/27/2015   Gastro-esophageal reflux disease  without esophagitis 01/27/2015   Personal history of arthritis 01/27/2015   Insomnia 01/27/2015   Scoliosis 01/27/2015   FOM (frequency of micturition) 01/27/2015    Past Surgical History:  Procedure Laterality Date   ABDOMINAL HYSTERECTOMY     BACK SURGERY  10/2000   BREAST EXCISIONAL BIOPSY Right 1990's   NEG   BROW LIFT Bilateral 07/23/2020   Procedure: BLEPHAROPLASTY UPPER EYELID; W/EXCESS SKIN BLEPHAROPTOSIS REPAIR; RESECT EX BILATERAL;  Surgeon: Karle Starch, MD;  Location: [place];  Service: Ophthalmology;  Laterality: Bilateral;   COLONOSCOPY WITH PROPOFOL N/A 03/23/2020   Procedure: COLONOSCOPY WITH PROPOFOL;  Surgeon: Lucilla Lame, MD;  Location: [place];  Service: Endoscopy;  Laterality: N/A;  priority 4   FEMUR FRACTURE SURGERY Right 07/15/2009   Pertrochanteric femur fracture.   HIP FRACTURE SURGERY Right    PERIPHERAL VASCULAR THROMBECTOMY Left 11/11/2019   Procedure: PERIPHERAL VASCULAR THROMBECTOMY;  Surgeon: Algernon Huxley, MD;  Location: [place] [facility];  Service: Cardiovascular;  Laterality: Left;   POLYPECTOMY  03/23/2020   Procedure: POLYPECTOMY;  Surgeon: Lucilla Lame, MD;  Location: [facility];  Service: Endoscopy;;   REVISION TOTAL HIP ARTHROPLASTY Left     Prior to Admission medications   Medication Sig Start Date End Date Taking? Authorizing Provider  ondansetron (ZOFRAN) 4 MG tablet Take 1 tablet (4 mg total) by mouth every 8 (eight) hours as needed for up to 10 doses for nausea or vomiting. 05/23/21  Yes Hulan Saas  P, MD  atenolol (TENORMIN) 25 MG tablet Take 1 tablet (25 mg total) by mouth daily. 10/27/20   Mar Daring, PA-C  benzonatate (TESSALON) 200 MG capsule Take 1 capsule (200 mg total) by mouth 2 (two) times daily as needed for cough. Patient not taking: Reported on 02/22/2021 01/18/21   Rodriguez-Southworth, Sunday Spillers, PA-C  betamethasone dipropionate 0.05 % cream Apply 1 application topically as needed.      [provider]  DETROL 2 MG tablet Take 1 tablet (2 mg total) by mouth 2 (two) times daily. 05/18/21   Gwyneth Sprout, FNP  fluticasone (FLONASE) 50 MCG/ACT nasal spray Place 2 sprays into both nostrils daily. Patient not taking: Reported on 02/22/2021 02/05/21   Verda Cumins, MD  furosemide (LASIX) 40 MG tablet Take 1 tablet (40 mg total) by mouth daily. 10/27/20   Mar Daring, PA-C  gabapentin (NEURONTIN) 400 MG capsule Take 400 mg by mouth 3 (three) times daily. Patient not taking: Reported on 02/22/2021 12/31/19   [provider]  omeprazole (PRILOSEC) 20 MG capsule Take 1 capsule (20 mg total) by mouth daily. 10/27/20   Mar Daring, PA-C  promethazine-dextromethorphan (PROMETHAZINE-DM) 6.25-15 MG/5ML syrup Take 5 mLs by mouth 4 (four) times daily as needed for cough. Patient not taking: Reported on 02/22/2021 02/05/21   Verda Cumins, MD  rivaroxaban (XARELTO) 20 MG TABS tablet Take 1 tablet (20 mg total) by mouth daily with supper. 10/27/20   Mar Daring, PA-C  traZODone (DESYREL) 100 MG tablet Take 1 tablet (100 mg total) by mouth at bedtime. 10/27/20   Mar Daring, PA-C  triamterene-hydrochlorothiazide (MAXZIDE-25) 37.5-25 MG tablet Take 1 tablet by mouth daily. 10/27/20   Mar Daring, PA-C  venlafaxine XR (EFFEXOR-XR) 75 MG 24 hr capsule Take 1 capsule (75 mg total) by mouth daily. 10/27/20   Mar Daring, PA-C  carbamazepine (TEGRETOL XR) 100 MG 12 hr tablet Take 200 mg by mouth 2 (two) times daily. 11/28/19 01/18/21  [provider]  potassium chloride SA (KLOR-CON) 20 MEQ tablet Take 1 tablet (20 mEq total) by mouth daily. 10/27/20 01/18/21  Mar Daring, PA-C    Allergies Patient has no known allergies.  Family History  Problem Relation Age of Onset   Osteoarthritis Mother    Lung cancer Father    Diabetes Brother    Breast cancer Neg Hx     Social History Social History   Tobacco Use   Smoking  status: Never   Smokeless tobacco: Never  Vaping Use   Vaping Use: Never used  Substance Use Topics   Alcohol use: No   Drug use: No    Review of Systems  Review of Systems  Constitutional:  Positive for malaise/fatigue. Negative for chills and fever.  HENT:  Negative for sore throat.   Eyes:  Negative for pain.  Respiratory:  Positive for shortness of breath. Negative for cough and stridor.   Cardiovascular:  Negative for chest pain.  Gastrointestinal:  Positive for nausea and vomiting.  Skin:  Negative for rash.  Neurological:  Positive for weakness. Negative for seizures, loss of consciousness and headaches.  Psychiatric/Behavioral:  Negative for suicidal ideas.   All other systems reviewed and are negative.    ____________________________________________   PHYSICAL EXAM:  VITAL SIGNS: ED Triage Vitals [05/23/21 1613]  Enc Vitals Group     BP 137/81     Pulse Rate 76     Resp 18     Temp  97.7 F (36.5 C)     Temp Source Oral     SpO2 98 %     Weight 180 lb (81.6 kg)     Height 5\' 6"  (1.676 m)     Head Circumference      Peak Flow      Pain Score 0     Pain Loc      Pain Edu?      Excl. in [place]?    Vitals:   05/23/21 1630 05/23/21 1730  BP: (!) 140/93 136/79  Pulse: 84 86  Resp: 13 15  Temp:    SpO2: 99% 98%   Physical Exam Vitals and nursing note reviewed.  Constitutional:      General: She is not in acute distress.    Appearance: She is well-developed.  HENT:     Head: Normocephalic and atraumatic.     Right Ear: External ear normal.     Left Ear: External ear normal.     Nose: Nose normal.     Mouth/Throat:     Mouth: Mucous membranes are dry.  Eyes:     Conjunctiva/sclera: Conjunctivae normal.  Cardiovascular:     Rate and Rhythm: Normal rate and regular rhythm.     Heart sounds: No murmur heard. Pulmonary:     Effort: Pulmonary effort is normal. No respiratory distress.     Breath sounds: Normal breath sounds.  Abdominal:      Palpations: Abdomen is soft.     Tenderness: There is no abdominal tenderness.  Musculoskeletal:     Cervical back: Neck supple.  Skin:    General: Skin is warm and dry.     Capillary Refill: Capillary refill takes 2 to 3 seconds.  Neurological:     Mental Status: She is alert and oriented to person, place, and time.  Psychiatric:        Mood and Affect: Mood normal.    Cranial nerves II through XII are grossly intact.  No pronator drift.  No finger dysmetria.  There is little weakness in the left shoulder which patient is states is chronic and baseline.  She otherwise has full strength in her bilateral upper and lower extremities.  Sensation is intact light touch lower extremities.  2+ radial pulse. ____________________________________________   LABS (all labs ordered are listed, but only abnormal results are displayed)  Labs Reviewed  COMPREHENSIVE METABOLIC PANEL - Abnormal; Notable for the following components:      Result Value   Glucose, Bld 172 (*)    Creatinine, Ser 1.21 (*)    Alkaline Phosphatase 171 (*)    GFR, Estimated 49 (*)    All other components within normal limits  CBC - Abnormal; Notable for the following components:   WBC 11.9 (*)    Hemoglobin 10.9 (*)    HCT 35.7 (*)    MCV 73.8 (*)    MCH 22.5 (*)    RDW 17.4 (*)    All other components within normal limits  URINALYSIS, COMPLETE (UACMP) WITH MICROSCOPIC - Abnormal; Notable for the following components:   Color, Urine YELLOW (*)    APPearance HAZY (*)    All other components within normal limits  RESP PANEL BY RT-PCR (FLU A&B, COVID) ARPGX2  LIPASE, BLOOD   ____________________________________________  EKG  Sinus rhythm with a ventricular rate of 85, incomplete right bundle branch block, left anterior fascicle block without evidence of acute ischemia or significant arrhythmia. ____________________________________________  RADIOLOGY  ED MD interpretation: Chest  x-ray has no evidence of focal  consolidation, effusion, edema, pneumothorax or any other clear acute thoracic process.  Official radiology report(s): DG Chest 2 View  Result Date: 05/23/2021 CLINICAL DATA:  Shortness of breath and weakness. EXAM: CHEST - 2 VIEW COMPARISON:  January 19, 2020 FINDINGS: Cardiomediastinal silhouette is normal. Mediastinal contours appear intact. Tortuosity of the aorta. There is no evidence of focal airspace consolidation, pleural effusion or pneumothorax. Osseous structures are without acute abnormality. Soft tissues are grossly normal. IMPRESSION: No active cardiopulmonary disease. Electronically Signed   By: Fidela Salisbury M.D.   On: 05/23/2021 17:07    ____________________________________________   PROCEDURES  Procedure(s) performed (including Critical Care):  .1-3 Lead EKG Interpretation Performed by: Lucrezia Starch, MD Authorized by: Lucrezia Starch, MD     Interpretation: normal     ECG rate assessment: normal     Rhythm: sinus rhythm     Ectopy: none     Conduction: normal     ____________________________________________   INITIAL IMPRESSION / ASSESSMENT AND PLAN / ED COURSE      Patient presents with above-stated history exam for assessment of some nonbloody nonbilious nausea and vomiting as well as possibly very slight acute on chronic component of shortness of breath started earlier today.  On arrival she is afebrile and hemodynamically stable.  She is awake and alert and her oropharynx is dry but otherwise unremarkable.  Lungs are clear bilaterally and the abdomen is soft nontender throughout.  Differential includes possibly acute infectious gastritis, acute infectious process i.e. COVID or influenza, cystitis, atypical presentation for ACS, metabolic derangements with a lower suspicion for diverticulitis appendicitis or cholecystitis given absence of any reported abdominal pain or tenderness of the abdomen on exam.  No history of recent trauma.  Low suspicion for  toxic ingestion.  No acute focal deficits to suggest CVA.  EKG shows sinus rhythm without evidence of ischemia to suggest atypical presentation for ACS.  Chest x-ray has no evidence of focal consolidation, effusion, edema, pneumothorax or any other clear acute thoracic process.  CBC with WBC count 11.9 without evidence of significant acute anemia.  Platelets are normal.  CMP shows no significant electrolyte or metabolic derangements.  Glucose is slightly elevated 172.  No evidence of hepatitis or cholestasis.  COVID influenza PCR is negative.  Lipase not consistent with pancreatitis.  UA without evidence of infection.  Overall unclear etiology of patient's symptoms but certainly possible she has infectious gastritis.  Given she is feeling much better after some Zofran and fluids and is tolerating p.o. with otherwise reassuring exam work-up I think she is stable for discharge with outpatient follow-up.  Rx written for Zofran.  Discharged in stable condition.       ____________________________________________   FINAL CLINICAL IMPRESSION(S) / ED DIAGNOSES  Final diagnoses:  Nausea and vomiting, unspecified vomiting type    Medications  ondansetron (ZOFRAN) injection 4 mg (4 mg Intravenous Given 05/23/21 1659)  lactated ringers bolus 1,000 mL (1,000 mLs Intravenous New Bag/Given 05/23/21 1700)     ED Discharge Orders          Ordered    ondansetron (ZOFRAN) 4 MG tablet  Every 8 hours PRN        05/23/21 1830             Note:  This document was prepared using Dragon voice recognition software and may include unintentional dictation errors.    Lucrezia Starch, MD 05/23/21 1900

## 2021-05-23 NOTE — ED Triage Notes (Signed)
Pt via POV from home. Pt c/o weakness and emesis since this morning. Denies pain. Pt had 2 episodes of vomiting. Pt is A&Ox4 and NAD.   Denies fever. Denies diarrhea.

## 2021-05-23 NOTE — ED Notes (Signed)
Patient back from  X-ray 

## 2021-05-24 ENCOUNTER — Other Ambulatory Visit: Payer: Self-pay

## 2021-05-24 DIAGNOSIS — Z79899 Other long term (current) drug therapy: Secondary | ICD-10-CM | POA: Diagnosis not present

## 2021-05-24 DIAGNOSIS — R55 Syncope and collapse: Secondary | ICD-10-CM | POA: Insufficient documentation

## 2021-05-24 DIAGNOSIS — R110 Nausea: Secondary | ICD-10-CM | POA: Insufficient documentation

## 2021-05-24 DIAGNOSIS — Z20822 Contact with and (suspected) exposure to covid-19: Secondary | ICD-10-CM | POA: Diagnosis not present

## 2021-05-24 DIAGNOSIS — Z96642 Presence of left artificial hip joint: Secondary | ICD-10-CM | POA: Insufficient documentation

## 2021-05-24 DIAGNOSIS — R42 Dizziness and giddiness: Secondary | ICD-10-CM | POA: Diagnosis not present

## 2021-05-24 DIAGNOSIS — I10 Essential (primary) hypertension: Secondary | ICD-10-CM | POA: Insufficient documentation

## 2021-05-24 LAB — CBC
HCT: 35.2 % — ABNORMAL LOW (ref 36.0–46.0)
Hemoglobin: 10.9 g/dL — ABNORMAL LOW (ref 12.0–15.0)
MCH: 22.7 pg — ABNORMAL LOW (ref 26.0–34.0)
MCHC: 31 g/dL (ref 30.0–36.0)
MCV: 73.2 fL — ABNORMAL LOW (ref 80.0–100.0)
Platelets: 265 10*3/uL (ref 150–400)
RBC: 4.81 MIL/uL (ref 3.87–5.11)
RDW: 17.5 % — ABNORMAL HIGH (ref 11.5–15.5)
WBC: 9.9 10*3/uL (ref 4.0–10.5)
nRBC: 0 % (ref 0.0–0.2)

## 2021-05-24 LAB — COMPREHENSIVE METABOLIC PANEL
ALT: 27 U/L (ref 0–44)
AST: 37 U/L (ref 15–41)
Albumin: 4.1 g/dL (ref 3.5–5.0)
Alkaline Phosphatase: 152 U/L — ABNORMAL HIGH (ref 38–126)
Anion gap: 12 (ref 5–15)
BUN: 21 mg/dL (ref 8–23)
CO2: 28 mmol/L (ref 22–32)
Calcium: 9.1 mg/dL (ref 8.9–10.3)
Chloride: 100 mmol/L (ref 98–111)
Creatinine, Ser: 1.04 mg/dL — ABNORMAL HIGH (ref 0.44–1.00)
GFR, Estimated: 59 mL/min — ABNORMAL LOW (ref >60)
Glucose, Bld: 162 mg/dL — ABNORMAL HIGH (ref 70–99)
Potassium: 3.5 mmol/L (ref 3.5–5.1)
Sodium: 140 mmol/L (ref 135–145)
Total Bilirubin: 0.3 mg/dL (ref 0.3–1.2)
Total Protein: 7.5 g/dL (ref 6.5–8.1)

## 2021-05-24 NOTE — ED Triage Notes (Signed)
Pt brought in by [facility] for co nausea that started yesterday. Pt seen here yesterday for the same and was discharged home with prn nausea meds. Here for persistent nausea, no vomiting. Also co generalized weakness.

## 2021-05-25 ENCOUNTER — Emergency Department: Payer: Medicare Other

## 2021-05-25 ENCOUNTER — Observation Stay: Payer: Medicare Other

## 2021-05-25 ENCOUNTER — Observation Stay
Admission: EM | Admit: 2021-05-25 | Discharge: 2021-05-26 | Disposition: A | Discharge status: Home / Self Care | Payer: Medicare Other | Attending: Internal Medicine | Admitting: Internal Medicine

## 2021-05-25 DIAGNOSIS — R110 Nausea: Secondary | ICD-10-CM

## 2021-05-25 DIAGNOSIS — R42 Dizziness and giddiness: Secondary | ICD-10-CM | POA: Diagnosis not present

## 2021-05-25 DIAGNOSIS — N179 Acute kidney failure, unspecified: Secondary | ICD-10-CM

## 2021-05-25 DIAGNOSIS — E876 Hypokalemia: Secondary | ICD-10-CM

## 2021-05-25 DIAGNOSIS — D500 Iron deficiency anemia secondary to blood loss (chronic): Secondary | ICD-10-CM

## 2021-05-25 DIAGNOSIS — R112 Nausea with vomiting, unspecified: Secondary | ICD-10-CM

## 2021-05-25 DIAGNOSIS — H8110 Benign paroxysmal vertigo, unspecified ear: Secondary | ICD-10-CM | POA: Diagnosis not present

## 2021-05-25 DIAGNOSIS — R55 Syncope and collapse: Secondary | ICD-10-CM | POA: Diagnosis present

## 2021-05-25 DIAGNOSIS — I10 Essential (primary) hypertension: Secondary | ICD-10-CM

## 2021-05-25 LAB — LIPASE, BLOOD: Lipase: 30 U/L (ref 11–51)

## 2021-05-25 LAB — RESP PANEL BY RT-PCR (FLU A&B, COVID) ARPGX2
Influenza A by PCR: NEGATIVE
Influenza B by PCR: NEGATIVE
SARS Coronavirus 2 by RT PCR: NEGATIVE

## 2021-05-25 LAB — HIV ANTIBODY (ROUTINE TESTING W REFLEX): HIV Screen 4th Generation wRfx: NONREACTIVE

## 2021-05-25 LAB — TROPONIN I (HIGH SENSITIVITY): Troponin I (High Sensitivity): 7 ng/L (ref <18)

## 2021-05-25 MED ORDER — ONDANSETRON HCL 4 MG/2ML IJ SOLN
4.0000 mg | Freq: Once | INTRAMUSCULAR | Status: AC
  Administered 2021-05-25: 4 mg via INTRAVENOUS
  Filled 2021-05-25: qty 2

## 2021-05-25 MED ORDER — PANTOPRAZOLE SODIUM 40 MG PO TBEC
40.0000 mg | DELAYED_RELEASE_TABLET | Freq: Every day | ORAL | Status: DC
  Administered 2021-05-25 – 2021-05-26 (×2): 40 mg via ORAL
  Filled 2021-05-25 (×2): qty 1

## 2021-05-25 MED ORDER — TRIAMCINOLONE ACETONIDE 0.5 % EX CREA
TOPICAL_CREAM | Freq: Two times a day (BID) | CUTANEOUS | Status: DC
  Filled 2021-05-25: qty 15

## 2021-05-25 MED ORDER — ACETAMINOPHEN 650 MG RE SUPP
650.0000 mg | Freq: Four times a day (QID) | RECTAL | Status: DC | PRN
  Filled 2021-05-25: qty 1

## 2021-05-25 MED ORDER — FUROSEMIDE 40 MG PO TABS
40.0000 mg | ORAL_TABLET | Freq: Every day | ORAL | Status: DC
  Administered 2021-05-25: 40 mg via ORAL
  Filled 2021-05-25: qty 1

## 2021-05-25 MED ORDER — MECLIZINE HCL 25 MG PO TABS
25.0000 mg | ORAL_TABLET | Freq: Three times a day (TID) | ORAL | Status: DC
  Filled 2021-05-25: qty 1

## 2021-05-25 MED ORDER — DIAZEPAM 5 MG/ML IJ SOLN
2.0000 mg | Freq: Once | INTRAMUSCULAR | Status: AC
  Administered 2021-05-25: 2 mg via INTRAVENOUS
  Filled 2021-05-25: qty 2

## 2021-05-25 MED ORDER — OXYBUTYNIN CHLORIDE ER 5 MG PO TB24
5.0000 mg | ORAL_TABLET | Freq: Every day | ORAL | Status: DC
  Administered 2021-05-25: 5 mg via ORAL
  Filled 2021-05-25 (×2): qty 1

## 2021-05-25 MED ORDER — VENLAFAXINE HCL ER 75 MG PO CP24
75.0000 mg | ORAL_CAPSULE | Freq: Every day | ORAL | Status: DC
  Administered 2021-05-25 – 2021-05-26 (×2): 75 mg via ORAL
  Filled 2021-05-25 (×2): qty 1

## 2021-05-25 MED ORDER — CARBAMAZEPINE ER 200 MG PO TB12
200.0000 mg | ORAL_TABLET | Freq: Two times a day (BID) | ORAL | Status: DC
  Administered 2021-05-25 – 2021-05-26 (×3): 200 mg via ORAL
  Filled 2021-05-25 (×4): qty 1

## 2021-05-25 MED ORDER — ACETAMINOPHEN 325 MG PO TABS: 650.0000 mg | ORAL_TABLET | Freq: Four times a day (QID) | ORAL | Status: DC | PRN

## 2021-05-25 MED ORDER — PREGABALIN 25 MG PO CAPS
25.0000 mg | ORAL_CAPSULE | Freq: Every day | ORAL | Status: DC
  Administered 2021-05-25 – 2021-05-26 (×2): 25 mg via ORAL
  Filled 2021-05-25 (×2): qty 1

## 2021-05-25 MED ORDER — ATENOLOL 25 MG PO TABS
25.0000 mg | ORAL_TABLET | Freq: Every day | ORAL | Status: DC
  Administered 2021-05-25 – 2021-05-26 (×2): 25 mg via ORAL
  Filled 2021-05-25 (×2): qty 1

## 2021-05-25 MED ORDER — SODIUM CHLORIDE 0.9 % IV SOLN: INTRAVENOUS | Status: DC

## 2021-05-25 MED ORDER — TRAZODONE HCL 100 MG PO TABS
100.0000 mg | ORAL_TABLET | Freq: Every day | ORAL | Status: DC
  Administered 2021-05-25: 100 mg via ORAL
  Filled 2021-05-25: qty 1

## 2021-05-25 MED ORDER — MAGNESIUM HYDROXIDE 400 MG/5ML PO SUSP: 30.0000 mL | Freq: Every day | ORAL | Status: DC | PRN

## 2021-05-25 MED ORDER — HYDROXYZINE HCL 10 MG PO TABS
10.0000 mg | ORAL_TABLET | Freq: Three times a day (TID) | ORAL | Status: DC
  Administered 2021-05-25 – 2021-05-26 (×4): 10 mg via ORAL
  Filled 2021-05-25 (×6): qty 1

## 2021-05-25 MED ORDER — MECLIZINE HCL 25 MG PO TABS
12.5000 mg | ORAL_TABLET | Freq: Three times a day (TID) | ORAL | Status: DC | PRN
  Administered 2021-05-25: 12.5 mg via ORAL
  Filled 2021-05-25 (×2): qty 0.5

## 2021-05-25 MED ORDER — TRIAMTERENE-HCTZ 37.5-25 MG PO TABS
1.0000 | ORAL_TABLET | Freq: Every day | ORAL | Status: DC
  Administered 2021-05-25 – 2021-05-26 (×2): 1 via ORAL
  Filled 2021-05-25 (×2): qty 1

## 2021-05-25 MED ORDER — INFLUENZA VAC A&B SA ADJ QUAD 0.5 ML IM PRSY: 0.5000 mL | PREFILLED_SYRINGE | INTRAMUSCULAR | Status: DC

## 2021-05-25 MED ORDER — MECLIZINE HCL 25 MG PO TABS: 12.5000 mg | ORAL_TABLET | Freq: Three times a day (TID) | ORAL | Status: DC

## 2021-05-25 MED ORDER — ONDANSETRON HCL 4 MG PO TABS
4.0000 mg | ORAL_TABLET | Freq: Three times a day (TID) | ORAL | Status: DC | PRN
  Administered 2021-05-25: 4 mg via ORAL
  Filled 2021-05-25: qty 1

## 2021-05-25 MED ORDER — TRAZODONE HCL 50 MG PO TABS: 25.0000 mg | ORAL_TABLET | Freq: Every evening | ORAL | Status: DC | PRN

## 2021-05-25 MED ORDER — IOHEXOL 350 MG/ML SOLN
75.0000 mL | Freq: Once | INTRAVENOUS | Status: AC | PRN
  Administered 2021-05-25: 75 mL via INTRAVENOUS

## 2021-05-25 MED ORDER — SODIUM CHLORIDE 0.9 % IV BOLUS
1000.0000 mL | Freq: Once | INTRAVENOUS | Status: AC
  Administered 2021-05-25: 1000 mL via INTRAVENOUS

## 2021-05-25 MED ORDER — RIVAROXABAN 20 MG PO TABS
20.0000 mg | ORAL_TABLET | Freq: Every day | ORAL | Status: DC
  Administered 2021-05-25: 20 mg via ORAL
  Filled 2021-05-25 (×2): qty 1

## 2021-05-25 NOTE — ED Notes (Signed)
Assisted patient to restroom; ambulated with walker; urine specimen collected/ labeled; sent to lab

## 2021-05-25 NOTE — ED Notes (Signed)
Pt noted slumped backwards in chair in lobby, unable to sit upright; family reports was attempting to reposition pt but she "collapsed"; pt c/o dizziness, nausea, weakness; st was seen Sunday for N/V, exam WNL and received IVFs and meds with some relief; became worse throughout the day; family st that was having periods of confusion as well;  at this time pt is alert & oriented to self and time but believes she is at the hospital in [place]; denies any recent illness, denies any pain; vss; pt assisted back into w/c and taken to room 11; assisted into hosp gown and into bed on card monitor; Dr Beather Arbour notified of pt's c/o

## 2021-05-25 NOTE — ED Provider Notes (Signed)
[facility] Emergency Department Provider Note   ____________________________________________   Event Date/Time   First MD Initiated Contact with Patient 05/25/21 0251     (approximate)  I have reviewed the triage vital signs and the nursing notes.   HISTORY  Chief Complaint Nausea    HPI Kristi Lee is a 65 y.o. female brought to the ED via EMS from home with a chief complaint of nausea and dizziness.  Patient reports symptoms started yesterday.  Patient was seen in the ED yesterday for same with negative work-up.  Patient endorses dizziness is worse this morning.  Describes both sensation of motion as well as lightheaded feeling with nausea exacerbated by turning her head from side to side.  Denies recent fever, cough, chest pain, shortness of breath, abdominal pain, vomiting, diarrhea.  Denies recent sinus issues.  Denies associated headache, slurred speech, altered mentation, extremity weakness/numbness/tingling.  On Xarelto for DVT.     Past Medical History:  Diagnosis Date   Arthritis    back   Depression    DVT (deep venous thrombosis) (HCC) 01/25/2020   GERD (gastroesophageal reflux disease)    Hypertension    PONV (postoperative nausea and vomiting)    Scoliosis    reports she has rods in her back    Patient Active Problem List   Diagnosis Date Noted   Near syncope 05/25/2021   Dyspnea on exertion 08/19/2020   Special screening for malignant neoplasms, colon    Polyp of transverse colon    DVT (deep venous thrombosis) ([place]) 11/08/2019   Burning sensation 10/17/2019   Itchy scalp 10/17/2019   Occipital neuralgia of right side 10/17/2019   Tingling 10/17/2019   Hyperlipidemia, mixed 10/10/2016   Stable angina pectoris ([place]) 10/10/2016   Adaptation reaction 01/27/2015   Back pain, chronic 01/27/2015   CN (constipation) 01/27/2015   Abnormal LFTs 01/27/2015   Benign essential HTN 01/27/2015   Gastro-esophageal reflux disease  without esophagitis 01/27/2015   Personal history of arthritis 01/27/2015   Insomnia 01/27/2015   Scoliosis 01/27/2015   FOM (frequency of micturition) 01/27/2015    Past Surgical History:  Procedure Laterality Date   ABDOMINAL HYSTERECTOMY     BACK SURGERY  10/2000   BREAST EXCISIONAL BIOPSY Right 1990's   NEG   BROW LIFT Bilateral 07/23/2020   Procedure: BLEPHAROPLASTY UPPER EYELID; W/EXCESS SKIN BLEPHAROPTOSIS REPAIR; RESECT EX BILATERAL;  Surgeon: Karle Starch, MD;  Location: [place];  Service: Ophthalmology;  Laterality: Bilateral;   COLONOSCOPY WITH PROPOFOL N/A 03/23/2020   Procedure: COLONOSCOPY WITH PROPOFOL;  Surgeon: Lucilla Lame, MD;  Location: [place];  Service: Endoscopy;  Laterality: N/A;  priority 4   FEMUR FRACTURE SURGERY Right 07/15/2009   Pertrochanteric femur fracture.   HIP FRACTURE SURGERY Right    PERIPHERAL VASCULAR THROMBECTOMY Left 11/11/2019   Procedure: PERIPHERAL VASCULAR THROMBECTOMY;  Surgeon: Algernon Huxley, MD;  Location: [place] [facility];  Service: Cardiovascular;  Laterality: Left;   POLYPECTOMY  03/23/2020   Procedure: POLYPECTOMY;  Surgeon: Lucilla Lame, MD;  Location: [facility];  Service: Endoscopy;;   REVISION TOTAL HIP ARTHROPLASTY Left     Prior to Admission medications   Medication Sig Start Date End Date Taking? Authorizing Provider  atenolol (TENORMIN) 25 MG tablet Take 1 tablet (25 mg total) by mouth daily. 10/27/20  Yes Fenton Malling M, PA-C  betamethasone dipropionate 0.05 % cream Apply 1 application topically as needed.    Yes [provider]  carbamazepine (TEGRETOL XR) 200 MG 12 hr tablet Take 200 mg by mouth 2 (two) times daily. 03/05/21  Yes [provider]  DETROL 2 MG tablet Take 1 tablet (2 mg total) by mouth 2 (two) times daily. 05/18/21  Yes Gwyneth Sprout, FNP  furosemide (LASIX) 40 MG tablet Take 1 tablet (40 mg total) by mouth daily. 10/27/20  Yes Mar Daring, PA-C   hydrOXYzine (ATARAX/VISTARIL) 10 MG tablet Take 10 mg by mouth 3 (three) times daily. 05/10/21  Yes [provider]  omeprazole (PRILOSEC) 20 MG capsule Take 1 capsule (20 mg total) by mouth daily. 10/27/20  Yes Mar Daring, PA-C  ondansetron (ZOFRAN) 4 MG tablet Take 1 tablet (4 mg total) by mouth every 8 (eight) hours as needed for up to 10 doses for nausea or vomiting. 05/23/21  Yes Lucrezia Starch, MD  pregabalin (LYRICA) 25 MG capsule Take 25 mg by mouth daily. 03/23/21  Yes [provider]  rivaroxaban (XARELTO) 20 MG TABS tablet Take 1 tablet (20 mg total) by mouth daily with supper. 10/27/20  Yes Mar Daring, PA-C  traZODone (DESYREL) 100 MG tablet Take 1 tablet (100 mg total) by mouth at bedtime. 10/27/20  Yes Mar Daring, PA-C  triamterene-hydrochlorothiazide (MAXZIDE-25) 37.5-25 MG tablet Take 1 tablet by mouth daily. 10/27/20  Yes Mar Daring, PA-C  venlafaxine XR (EFFEXOR-XR) 75 MG 24 hr capsule Take 1 capsule (75 mg total) by mouth daily. 10/27/20  Yes Mar Daring, PA-C  benzonatate (TESSALON) 200 MG capsule Take 1 capsule (200 mg total) by mouth 2 (two) times daily as needed for cough. Patient not taking: No sig reported 01/18/21   Rodriguez-Southworth, Sunday Spillers, PA-C  fluticasone (FLONASE) 50 MCG/ACT nasal spray Place 2 sprays into both nostrils daily. Patient not taking: No sig reported 02/05/21   Verda Cumins, MD  gabapentin (NEURONTIN) 400 MG capsule Take 400 mg by mouth 3 (three) times daily. Patient not taking: No sig reported 12/31/19   [provider]  promethazine-dextromethorphan (PROMETHAZINE-DM) 6.25-15 MG/5ML syrup Take 5 mLs by mouth 4 (four) times daily as needed for cough. Patient not taking: No sig reported 02/05/21   Verda Cumins, MD  potassium chloride SA (KLOR-CON) 20 MEQ tablet Take 1 tablet (20 mEq total) by mouth daily. 10/27/20 01/18/21  Mar Daring, PA-C    Allergies Patient has no known  allergies.  Family History  Problem Relation Age of Onset   Osteoarthritis Mother    Lung cancer Father    Diabetes Brother    Breast cancer Neg Hx     Social History Social History   Tobacco Use   Smoking status: Never   Smokeless tobacco: Never  Vaping Use   Vaping Use: Never used  Substance Use Topics   Alcohol use: No   Drug use: No    Review of Systems  Constitutional: No fever/chills Eyes: No visual changes. ENT: No sore throat. Cardiovascular: Denies chest pain. Respiratory: Denies shortness of breath. Gastrointestinal: No abdominal pain.  No nausea, no vomiting.  No diarrhea.  No constipation. Genitourinary: Negative for dysuria. Musculoskeletal: Negative for back pain. Skin: Negative for rash. Neurological: Positive for dizziness.  Negative for headaches, focal weakness or numbness.   ____________________________________________   PHYSICAL EXAM:  VITAL SIGNS: ED Triage Vitals [05/24/21 2256]  Enc Vitals Group     BP (!) 156/91     Pulse Rate (!) 111     Resp 20     Temp 98  F (36.7 C)     Temp Source Oral     SpO2 98 %     Weight 180 lb (81.6 kg)     Height 5\' 6"  (1.676 m)     Head Circumference      Peak Flow      Pain Score 0     Pain Loc      Pain Edu?      Excl. in [place]?     Constitutional: Alert and oriented.  Uncomfortable appearing and in mild acute distress. Eyes: Conjunctivae are normal. PERRL. EOMI. Head: Atraumatic. Nose: No congestion/rhinnorhea. Mouth/Throat: Mucous membranes are moist.   Neck: No stridor.  Supple neck without meningismus.  No carotid bruits. Cardiovascular: Normal rate, regular rhythm. Grossly normal heart sounds.  Good peripheral circulation. Respiratory: Normal respiratory effort.  No retractions. Lungs CTAB. Gastrointestinal: Soft and nontender to light or deep palpation. No distention. No abdominal bruits. No CVA tenderness. Musculoskeletal: No lower extremity tenderness nor edema.  No joint  effusions. Neurologic: Alert and oriented x3.  CN II to XII grossly intact.  Normal speech and language. No gross focal neurologic deficits are appreciated. MAEx4. Skin:  Skin is warm, dry and intact. No rash noted.  No petechiae. Psychiatric: Mood and affect are normal. Speech and behavior are normal.  ____________________________________________   LABS (all labs ordered are listed, but only abnormal results are displayed)  Labs Reviewed  CBC - Abnormal; Notable for the following components:      Result Value   Hemoglobin 10.9 (*)    HCT 35.2 (*)    MCV 73.2 (*)    MCH 22.7 (*)    RDW 17.5 (*)    All other components within normal limits  COMPREHENSIVE METABOLIC PANEL - Abnormal; Notable for the following components:   Glucose, Bld 162 (*)    Creatinine, Ser 1.04 (*)    Alkaline Phosphatase 152 (*)    GFR, Estimated 59 (*)    All other components within normal limits  RESP PANEL BY RT-PCR (FLU A&B, COVID) ARPGX2  LIPASE, BLOOD  TROPONIN I (HIGH SENSITIVITY)   ____________________________________________  EKG  ED ECG REPORT I, Donisha Hoch J, the attending physician, personally viewed and interpreted this ECG.   Date: 05/25/2021  EKG Time: 0240  Rate: 77  Rhythm: normal sinus rhythm  Axis: Normal  Intervals:none  ST&T Change: Nonspecific  ____________________________________________  RADIOLOGY I, Dywane Peruski J, personally viewed and evaluated these images (plain radiographs) as part of my medical decision making, as well as reviewing the written report by the radiologist.  ED MD interpretation: Unremarkable CTA head/neck  Official radiology report(s): CT ANGIO HEAD NECK W WO CM  Result Date: 05/25/2021 CLINICAL DATA:  Persistent nausea.  Dizziness EXAM: CT ANGIOGRAPHY HEAD AND NECK TECHNIQUE: Multidetector CT imaging of the head and neck was performed using the standard protocol during bolus administration of intravenous contrast. Multiplanar CT image reconstructions  and MIPs were obtained to evaluate the vascular anatomy. Carotid stenosis measurements (when applicable) are obtained utilizing NASCET criteria, using the distal internal carotid diameter as the denominator. CONTRAST:  79mL OMNIPAQUE IOHEXOL 350 MG/ML SOLN COMPARISON:  None. FINDINGS: CT HEAD FINDINGS Brain: No evidence of acute cortical infarction, hemorrhage, hydrocephalus, extra-axial collection or mass lesion/mass effect. Patchy low-density in the cerebral white matter and deep gray nuclei attributed to chronic small vessel disease Vascular: See below Skull: Normal. Negative for fracture or focal lesion. 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 Right carotid system: Vessels are smooth and widely patent with no noted atheromatous changes. Left carotid system: Vessels are smooth and widely patent with no noted atheromatous changes. Vertebral arteries: The left vertebral artery arises from the arch. No proximal subclavian stenosis. Diminutive vertebral arteries in the setting of fetal type PCA circulation. Segment of nonenhancing left mid vertebral artery but patent by the dura. Skeleton: Marked cervical spine degeneration with multilevel foraminal impingement and spinal stenosis. Partially covered thoracic scoliosis surgery. Other neck: Unremarkable Upper chest: Unremarkable Review of the MIP images confirms the above findings CTA HEAD FINDINGS Anterior circulation: No significant stenosis, proximal occlusion, aneurysm, or vascular malformation. Posterior circulation: Very diminutive vertebral arteries in the setting of fetal type bilateral PCA flow. No branch occlusion, beading, or aneurysm. Venous sinuses: Diffusely patent Anatomic variants: As above Review of the MIP images confirms the above findings IMPRESSION: 1. No definite cause for symptoms. 2. 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 3. No noted atheromatous disease. 4. Chronic small vessel ischemia seen in the hemispheric white matter. Electronically Signed   By: Jorje Guild M.D.   On: 05/25/2021 04:48    ____________________________________________   PROCEDURES  Procedure(s) performed (including Critical Care):  .1-3 Lead EKG Interpretation Performed by: Paulette Blanch, MD Authorized by: Paulette Blanch, MD     Interpretation: normal     ECG rate:  77   ECG rate assessment: normal     Rhythm: sinus rhythm     Ectopy: none     Conduction: normal   Comments:     Patient placed on cardiac monitor to evaluate for arrhythmias   ____________________________________________   INITIAL IMPRESSION / ASSESSMENT AND PLAN / ED COURSE  As part of my medical decision making, I reviewed the following data within the electronic MEDICAL RECORD NUMBER History obtained from family, Nursing notes reviewed and incorporated, Labs reviewed, EKG interpreted, Old chart reviewed, Radiograph reviewed, and Notes from prior ED visits     65 year old female who returns to the ED for nausea associated with dizziness.  Differential diagnosis includes but is not limited to TIA, ACS, infectious, metabolic etiologies, etc.  Patient reports history of back surgery and was told she cannot have MRIs.  I have looked in her chart but cannot find confirmation of this.  To be on the safe side, will obtain CTA head/neck.  Administer IV fluids, Valium for dizziness, Zofran for nausea.  Will reassess.  Clinical Course as of 05/25/21 1062  Tue May 25, 2021  0506 Updated patient and spouse on CT imaging results.  Room air saturations currently 85%, placed on 2 L nasal cannula oxygen with improvement to 95%.  Initial improvement with IV Valium but dizziness recurred when patient was moved for CT scan and she said she almost passed out.  Given this is  patient's second ED visit in 24 hours, will discuss with hospital services for admission. [JS]    Clinical Course User Index [JS] Paulette Blanch, MD     ____________________________________________   FINAL CLINICAL IMPRESSION(S) / ED DIAGNOSES  Final diagnoses:  Nausea  Dizziness  Near syncope     ED Discharge Orders     None        Note:  This document was prepared using Dragon voice recognition software and may include unintentional dictation errors.    Lurline Hare  J, MD 05/25/21 [phone]

## 2021-05-25 NOTE — Progress Notes (Addendum)
PROGRESS NOTE   HPI was taken from Dr. Sidney Ace: Kristi Lee is a 65 y.o. Caucasian female with medical history significant for essential hypertension, DVT, GERD, depression and scoliosis, who presented to the ER with acute onset of nausea and associated dizziness with presyncope.  She was seen in ER yesterday for similar symptoms and work-up was negative.  Her dizziness has been worse  this morning and she admitted to lightheadedness and vertigo.  No chest pain or palpitations.  She vomited twice yesterday but denied abdominal pain or diarrhea.  No cough or wheezing or dyspnea or fevers or chills.  No headache or diplopia or paresthesias or focal muscle weakness.  She denies any recent URIs.  ED Course: When she came to the ER blood pressure was 156/91 with a heart rate of 111 with otherwise normal vital signs.  Labs revealed borderline potassium of 3.5 and her alk phos was 152 with blood glucose of 162.  CBC showed anemia close to previous levels.  Follows antigens and COVID-19 PCR came back negative. EKG as reviewed by me : EKG showed normal sinus rhythm with a rate of 77 with incomplete right bundle branch block and left anterior fascicular block and right ventricular hypertrophy Imaging: CT angiogram of the neck and head with and without contrast showed chronic small vessel ischemic changes in the hemispheric white matter with no acute abnormality and short segment Enhancement of the very hypoplastic left vertebral artery with normalized flow by the dura with no noted atheromatous disease.  The patient was given 2 mg of IV Valium, 4 mg IV Zofran and 1 L bolus of IV normal saline.  She will be admitted to observation medical monitored bed for further evaluation and management.   LAWAN NANEZ  CHE:[id number] DOB: 1955/12/12 DOA: 05/25/2021 PCP: [facility] [place] [facility] [place]:   Active Problems:   Near syncope   Dizziness: w/ nausea & vomiting. Etiology  unclear, possibly BPPV. Meclizine prn. XR abd shows no evidence for bowel obstruction   Microcytic anemia: will check iron panel   Hx of DVT: continue on home dose of xarelto   HTN: continue on home dose of mexzide, atenolol   Depression: severity unknown. Continue on home dose of venlafaxine  Hx of occipital neuralgia: continue on home dose of pregabalin, carbamazepine   GERD: continue on PPI    DVT prophylaxis:  xarelto  Code Status: full  Family Communication: dicussed pt's care w/ pt's family at bedside and answered their questions  Disposition Plan: likely d/c back home   Level of care: Med-Surg  Status is: Observation  The patient remains OBS appropriate and will d/c before 2 midnights.  Dispo: The patient is from: Home              Anticipated d/c is to: Home              Patient currently is not medically stable to d/c.   Difficult to place patient : unclear    Consultants:    Procedures:   Antimicrobials:   Subjective: Pt c/o dry heaves & nausea   Objective: Vitals:   05/25/21 0330 05/25/21 0445 05/25/21 0500 05/25/21 0530  BP: (!) 143/70 (!) 169/99 131/85 (!) 149/84  Pulse: 77 91 89 85  Resp: _0 Temp:      TempSrc:      SpO2: 98% 94% 95% 94%  Weight:      Height:  No intake or output data in the 24 hours ending 05/25/21 0821 Filed Weights   05/24/21 2256  Weight: 81.6 kg    Examination:  General exam: Appears calm but  uncomfortable  Respiratory system: Clear to auscultation. Respiratory effort normal. Cardiovascular system: S1 & S2 +. No rubs, gallops or clicks.  Gastrointestinal system: Abdomen is nondistended, soft and nontender. hypoactive bowel sounds heard. Central nervous system: Alert and oriented. Moves all extremities  Psychiatry: Judgement and insight appear normal. Mood & affect appropriate.     Data Reviewed: I have personally reviewed following labs and imaging studies  CBC: Recent Labs  Lab 05/23/21 1626  05/24/21 2258  WBC 11.9* 9.9  HGB 10.9* 10.9*  HCT 35.7* 35.2*  MCV 73.8* 73.2*  PLT 278 650   Basic Metabolic Panel: Recent Labs  Lab 05/23/21 1626 05/24/21 2258  NA 140 140  K 3.9 3.5  CL 99 100  CO2 29 28  GLUCOSE 172* 162*  BUN 20 21  CREATININE 1.21* 1.04*  CALCIUM 9.5 9.1   GFR: Estimated Creatinine Clearance: 57.3 mL/min (A) (by C-G formula based on SCr of 1.04 mg/dL (H)). Liver Function Tests: Recent Labs  Lab 05/23/21 1626 05/24/21 2258  AST 32 37  ALT 26 27  ALKPHOS 171* 152*  BILITOT 0.7 0.3  PROT 7.7 7.5  ALBUMIN 4.5 4.1   Recent Labs  Lab 05/23/21 1753 05/24/21 2258  LIPASE 37 30   No results for input(s): AMMONIA in the last 168 hours. Coagulation Profile: No results for input(s): INR, PROTIME in the last 168 hours. Cardiac Enzymes: No results for input(s): CKTOTAL, CKMB, CKMBINDEX, TROPONINI in the last 168 hours. BNP (last 3 results) No results for input(s): PROBNP in the last 8760 hours. HbA1C: No results for input(s): HGBA1C in the last 72 hours. CBG: No results for input(s): GLUCAP in the last 168 hours. Lipid Profile: No results for input(s): CHOL, HDL, LDLCALC, TRIG, CHOLHDL, LDLDIRECT in the last 72 hours. Thyroid Function Tests: No results for input(s): TSH, T4TOTAL, FREET4, T3FREE, THYROIDAB in the last 72 hours. Anemia Panel: No results for input(s): VITAMINB12, FOLATE, FERRITIN, TIBC, IRON, RETICCTPCT in the last 72 hours. Sepsis Labs: No results for input(s): PROCALCITON, LATICACIDVEN in the last 168 hours.  Recent Results (from the past 240 hour(s))  Resp Panel by RT-PCR (Flu A&B, Covid) Nasopharyngeal Swab     Status: None   Collection Time: 05/23/21  4:44 PM   Specimen: Nasopharyngeal Swab; Nasopharyngeal(NP) swabs in vial transport medium  Result Value Ref Range Status   SARS Coronavirus 2 by RT PCR NEGATIVE NEGATIVE Final    Comment: (NOTE) SARS-CoV-2 target nucleic acids are NOT DETECTED.  The SARS-CoV-2 RNA is  generally detectable in upper respiratory specimens during the acute phase of infection. The lowest concentration of SARS-CoV-2 viral copies this assay can detect is 138 copies/mL. A negative result does not preclude SARS-Cov-2 infection and should not be used as the sole basis for treatment or other patient management decisions. A negative result may occur with  improper specimen collection/handling, submission of specimen other than nasopharyngeal swab, presence of viral mutation(s) within the areas targeted by this assay, and inadequate number of viral copies(<138 copies/mL). A negative result must be combined with clinical observations, patient history, and epidemiological information. The expected result is Negative.  Fact Sheet for Patients:  EntrepreneurPulse.com.au  Fact Sheet for Healthcare Providers:  IncredibleEmployment.be  This test is no t yet approved or cleared by the [place] and  has been authorized for detection and/or diagnosis of SARS-CoV-2 by FDA under an Emergency Use Authorization (EUA). This EUA will remain  in effect (meaning this test can be used) for the duration of the COVID-19 declaration under Section 564(b)(1) of the Act, 21 U.S.C.section 360bbb-3(b)(1), unless the authorization is terminated  or revoked sooner.       Influenza A by PCR NEGATIVE NEGATIVE Final   Influenza B by PCR NEGATIVE NEGATIVE Final    Comment: (NOTE) The Xpert Xpress SARS-CoV-2/FLU/RSV plus assay is intended as an aid in the diagnosis of influenza from Nasopharyngeal swab specimens and should not be used as a sole basis for treatment. Nasal washings and aspirates are unacceptable for Xpert Xpress SARS-CoV-2/FLU/RSV testing.  Fact Sheet for Patients: EntrepreneurPulse.com.au  Fact Sheet for Healthcare Providers: IncredibleEmployment.be  This test is not yet approved or cleared by the [place] FDA and has been authorized for detection and/or diagnosis of SARS-CoV-2 by FDA under an Emergency Use Authorization (EUA). This EUA will remain in effect (meaning this test can be used) for the duration of the COVID-19 declaration under Section 564(b)(1) of the Act, 21 U.S.C. section 360bbb-3(b)(1), unless the authorization is terminated or revoked.  Performed at [facility], [address] [place], [place] [address]   Resp Panel by RT-PCR (Flu A&B, Covid) Nasopharyngeal Swab     Status: None   Collection Time: 05/25/21  5:20 AM   Specimen: Nasopharyngeal Swab; Nasopharyngeal(NP) swabs in vial transport medium  Result Value Ref Range Status   SARS Coronavirus 2 by RT PCR NEGATIVE NEGATIVE Final    Comment: (NOTE) SARS-CoV-2 target nucleic acids are NOT DETECTED.  The SARS-CoV-2 RNA is generally detectable in upper respiratory specimens during the acute phase of infection. The lowest concentration of SARS-CoV-2 viral copies this assay can detect is 138 copies/mL. A negative result does not preclude SARS-Cov-2 infection and should not be used as the sole basis for treatment or other patient management decisions. A negative result may occur with  improper specimen collection/handling, submission of specimen other than nasopharyngeal swab, presence of viral mutation(s) within the areas targeted by this assay, and inadequate number of viral copies(<138 copies/mL). A negative result must be combined with clinical observations, patient history, and epidemiological information. The expected result is Negative.  Fact Sheet for Patients:  EntrepreneurPulse.com.au  Fact Sheet for Healthcare Providers:  IncredibleEmployment.be  This test is no t yet approved or cleared by the [place] FDA and  has been authorized for detection and/or diagnosis of SARS-CoV-2 by FDA under an Emergency Use Authorization (EUA). This EUA will remain   in effect (meaning this test can be used) for the duration of the COVID-19 declaration under Section 564(b)(1) of the Act, 21 U.S.C.section 360bbb-3(b)(1), unless the authorization is terminated  or revoked sooner.       Influenza A by PCR NEGATIVE NEGATIVE Final   Influenza B by PCR NEGATIVE NEGATIVE Final    Comment: (NOTE) The Xpert Xpress SARS-CoV-2/FLU/RSV plus assay is intended as an aid in the diagnosis of influenza from Nasopharyngeal swab specimens and should not be used as a sole basis for treatment. Nasal washings and aspirates are unacceptable for Xpert Xpress SARS-CoV-2/FLU/RSV testing.  Fact Sheet for Patients: EntrepreneurPulse.com.au  Fact Sheet for Healthcare Providers: IncredibleEmployment.be  This test is not yet approved or cleared by the [place] FDA and has been authorized for detection and/or diagnosis of SARS-CoV-2 by FDA under an Emergency Use Authorization (EUA). This EUA will remain in  effect (meaning this test can be used) for the duration of the COVID-19 declaration under Section 564(b)(1) of the Act, 21 U.S.C. section 360bbb-3(b)(1), unless the authorization is terminated or revoked.  Performed at [facility], [address] [place], [place] [address]          Radiology Studies: CT ANGIO HEAD NECK W WO CM  Result Date: 05/25/2021 CLINICAL DATA:  Persistent nausea.  Dizziness EXAM: CT ANGIOGRAPHY HEAD AND NECK TECHNIQUE: Multidetector CT imaging of the head and neck was performed using the standard protocol during bolus administration of intravenous contrast. Multiplanar CT image reconstructions and MIPs were obtained to evaluate the vascular anatomy. Carotid stenosis measurements (when applicable) are obtained utilizing NASCET criteria, using the distal internal carotid diameter as the denominator. CONTRAST:  41m OMNIPAQUE IOHEXOL 350 MG/ML SOLN COMPARISON:  None. FINDINGS: CT HEAD FINDINGS  Brain: No evidence of acute cortical infarction, hemorrhage, hydrocephalus, extra-axial collection or mass lesion/mass effect. Patchy low-density in the cerebral white matter and deep gray nuclei attributed to chronic small vessel disease Vascular: See below Skull: Normal. Negative for fracture or focal lesion. 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 Right carotid system: Vessels are smooth and widely patent with no noted atheromatous changes. Left carotid system: Vessels are smooth and widely patent with no noted atheromatous changes. Vertebral arteries: The left vertebral artery arises from the arch. No proximal subclavian stenosis. Diminutive vertebral arteries in the setting of fetal type PCA circulation. Segment of nonenhancing left mid vertebral artery but patent by the dura. Skeleton: Marked cervical spine degeneration with multilevel foraminal impingement and spinal stenosis. Partially covered thoracic scoliosis surgery. Other neck: Unremarkable Upper chest: Unremarkable Review of the MIP images confirms the above findings CTA HEAD FINDINGS Anterior circulation: No significant stenosis, proximal occlusion, aneurysm, or vascular malformation. Posterior circulation: Very diminutive vertebral arteries in the setting of fetal type bilateral PCA flow. No branch occlusion, beading, or aneurysm. Venous sinuses: Diffusely patent Anatomic variants: As above Review of the MIP images confirms the above findings IMPRESSION: 1. No definite cause for symptoms. 2. 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 3. No noted atheromatous disease. 4. Chronic small vessel ischemia seen in the hemispheric white matter. Electronically Signed   By: JJorje GuildM.D.   On: 05/25/2021 04:48   DG Chest 2  View  Result Date: 05/23/2021 CLINICAL DATA:  Shortness of breath and weakness. EXAM: CHEST - 2 VIEW COMPARISON:  January 19, 2020 FINDINGS: Cardiomediastinal silhouette is normal. Mediastinal contours appear intact. Tortuosity of the aorta. There is no evidence of focal airspace consolidation, pleural effusion or pneumothorax. Osseous structures are without acute abnormality. Soft tissues are grossly normal. IMPRESSION: No active cardiopulmonary disease. Electronically Signed   By: DFidela SalisburyM.D.   On: 05/23/2021 17:07        Scheduled Meds:  atenolol  25 mg Oral Daily   carbamazepine  200 mg Oral BID   furosemide  40 mg Oral Daily   hydrOXYzine  10 mg Oral TID   oxybutynin  5 mg Oral QHS   pantoprazole  40 mg Oral Daily   pregabalin  25 mg Oral Daily   rivaroxaban  20 mg Oral Q supper   traZODone  100 mg Oral QHS   triamcinolone cream   Topical BID   triamterene-hydrochlorothiazide  1  tablet Oral Daily   venlafaxine XR  75 mg Oral Daily   Continuous Infusions:  sodium chloride       LOS: 0 days    Time spent: 35 mins     Wyvonnia Dusky, MD [facility] Pager [phone]  If 7PM-7AM, please contact night-coverage 05/25/2021, 8:21 AM

## 2021-05-25 NOTE — H&P (Signed)
[place]   PATIENT NAME: Kristi Lee    MR#:  [id number]  DATE OF BIRTH:  07/04/1956  DATE OF ADMISSION:  05/25/2021  PRIMARY CARE PHYSICIAN: [facility] [place]   Patient is coming from: Home  REQUESTING/REFERRING PHYSICIAN: Lurline Hare, MD  CHIEF COMPLAINT:   Chief Complaint  Patient presents with   Nausea    HISTORY OF PRESENT ILLNESS:  Kristi Lee is a 65 y.o. Caucasian female with medical history significant for essential hypertension, DVT, GERD, depression and scoliosis, who presented to the ER with acute onset of nausea and associated dizziness with presyncope.  She was seen in ER yesterday for similar symptoms and work-up was negative.  Her dizziness has been worse  this morning and she admitted to lightheadedness and vertigo.  No chest pain or palpitations.  She vomited twice yesterday but denied abdominal pain or diarrhea.  No cough or wheezing or dyspnea or fevers or chills.  No headache or diplopia or paresthesias or focal muscle weakness.  She denies any recent URIs.  ED Course: When she came to the ER blood pressure was 156/91 with a heart rate of 111 with otherwise normal vital signs.  Labs revealed borderline potassium of 3.5 and her alk phos was 152 with blood glucose of 162.  CBC showed anemia close to previous levels.  Follows antigens and COVID-19 PCR came back negative. EKG as reviewed by me : EKG showed normal sinus rhythm with a rate of 77 with incomplete right bundle branch block and left anterior fascicular block and right ventricular hypertrophy Imaging: CT angiogram of the neck and head with and without contrast showed chronic small vessel ischemic changes in the hemispheric white matter with no acute abnormality and short segment Enhancement of the very hypoplastic left vertebral artery with normalized flow by the dura with no noted atheromatous disease.  The patient was given 2 mg of IV Valium, 4 mg IV Zofran and 1 L bolus  of IV normal saline.  She will be admitted to observation medical monitored bed for further evaluation and management. PAST MEDICAL HISTORY:   Past Medical History:  Diagnosis Date   Arthritis    back   Depression    DVT (deep venous thrombosis) ([place]) 01/25/2020   GERD (gastroesophageal reflux disease)    Hypertension    PONV (postoperative nausea and vomiting)    Scoliosis    reports she has rods in her back    PAST SURGICAL HISTORY:   Past Surgical History:  Procedure Laterality Date   ABDOMINAL HYSTERECTOMY     BACK SURGERY  10/2000   BREAST EXCISIONAL BIOPSY Right 1990's   NEG   BROW LIFT Bilateral 07/23/2020   Procedure: BLEPHAROPLASTY UPPER EYELID; W/EXCESS SKIN BLEPHAROPTOSIS REPAIR; RESECT EX BILATERAL;  Surgeon: Karle Starch, MD;  Location: [facility];  Service: Ophthalmology;  Laterality: Bilateral;   COLONOSCOPY WITH PROPOFOL N/A 03/23/2020   Procedure: COLONOSCOPY WITH PROPOFOL;  Surgeon: Lucilla Lame, MD;  Location: [facility];  Service: Endoscopy;  Laterality: N/A;  priority 4   FEMUR FRACTURE SURGERY Right 07/15/2009   Pertrochanteric femur fracture.   HIP FRACTURE SURGERY Right    PERIPHERAL VASCULAR THROMBECTOMY Left 11/11/2019   Procedure: PERIPHERAL VASCULAR THROMBECTOMY;  Surgeon: Algernon Huxley, MD;  Location: [facility];  Service: Cardiovascular;  Laterality: Left;   POLYPECTOMY  03/23/2020   Procedure: POLYPECTOMY;  Surgeon: Lucilla Lame, MD;  Location: [facility];  Service: Endoscopy;;  REVISION TOTAL HIP ARTHROPLASTY Left     SOCIAL HISTORY:   Social History   Tobacco Use   Smoking status: Never   Smokeless tobacco: Never  Substance Use Topics   Alcohol use: No    FAMILY HISTORY:   Family History  Problem Relation Age of Onset   Osteoarthritis Mother    Lung cancer Father    Diabetes Brother    Breast cancer Neg Hx     DRUG ALLERGIES:  No Known Allergies  REVIEW OF SYSTEMS:   ROS As per history of  present illness. All pertinent systems were reviewed above. Constitutional, HEENT, cardiovascular, respiratory, GI, GU, musculoskeletal, neuro, psychiatric, endocrine, integumentary and hematologic systems were reviewed and are otherwise negative/unremarkable except for positive findings mentioned above in the HPI.   MEDICATIONS AT HOME:   Prior to Admission medications   Medication Sig Start Date End Date Taking? Authorizing Provider  atenolol (TENORMIN) 25 MG tablet Take 1 tablet (25 mg total) by mouth daily. 10/27/20  Yes Fenton Malling M, PA-C  betamethasone dipropionate 0.05 % cream Apply 1 application topically as needed.    Yes [provider]  carbamazepine (TEGRETOL XR) 200 MG 12 hr tablet Take 200 mg by mouth 2 (two) times daily. 03/05/21  Yes [provider]  DETROL 2 MG tablet Take 1 tablet (2 mg total) by mouth 2 (two) times daily. 05/18/21  Yes Gwyneth Sprout, FNP  furosemide (LASIX) 40 MG tablet Take 1 tablet (40 mg total) by mouth daily. 10/27/20  Yes Mar Daring, PA-C  hydrOXYzine (ATARAX/VISTARIL) 10 MG tablet Take 10 mg by mouth 3 (three) times daily. 05/10/21  Yes [provider]  omeprazole (PRILOSEC) 20 MG capsule Take 1 capsule (20 mg total) by mouth daily. 10/27/20  Yes Mar Daring, PA-C  ondansetron (ZOFRAN) 4 MG tablet Take 1 tablet (4 mg total) by mouth every 8 (eight) hours as needed for up to 10 doses for nausea or vomiting. 05/23/21  Yes Lucrezia Starch, MD  pregabalin (LYRICA) 25 MG capsule Take 25 mg by mouth daily. 03/23/21  Yes [provider]  rivaroxaban (XARELTO) 20 MG TABS tablet Take 1 tablet (20 mg total) by mouth daily with supper. 10/27/20  Yes Mar Daring, PA-C  traZODone (DESYREL) 100 MG tablet Take 1 tablet (100 mg total) by mouth at bedtime. 10/27/20  Yes Mar Daring, PA-C  triamterene-hydrochlorothiazide (MAXZIDE-25) 37.5-25 MG tablet Take 1 tablet by mouth daily. 10/27/20  Yes Mar Daring, PA-C  venlafaxine XR (EFFEXOR-XR) 75 MG 24 hr capsule Take 1 capsule (75 mg total) by mouth daily. 10/27/20  Yes Mar Daring, PA-C  benzonatate (TESSALON) 200 MG capsule Take 1 capsule (200 mg total) by mouth 2 (two) times daily as needed for cough. Patient not taking: No sig reported 01/18/21   Rodriguez-Southworth, Sunday Spillers, PA-C  fluticasone (FLONASE) 50 MCG/ACT nasal spray Place 2 sprays into both nostrils daily. Patient not taking: No sig reported 02/05/21   Verda Cumins, MD  gabapentin (NEURONTIN) 400 MG capsule Take 400 mg by mouth 3 (three) times daily. Patient not taking: No sig reported 12/31/19   [provider]  promethazine-dextromethorphan (PROMETHAZINE-DM) 6.25-15 MG/5ML syrup Take 5 mLs by mouth 4 (four) times daily as needed for cough. Patient not taking: No sig reported 02/05/21   Verda Cumins, MD  potassium chloride SA (KLOR-CON) 20 MEQ tablet Take 1 tablet (20 mEq total) by mouth daily. 10/27/20 01/18/21  Mar Daring, PA-C  VITAL SIGNS:  Blood pressure (!) 149/84, pulse 85, temperature 98 F (36.7 C), temperature source Oral, resp. rate 17, height '5\' 6"'  (1.676 m), weight 81.6 kg, SpO2 94 %.  PHYSICAL EXAMINATION:  Physical Exam  GENERAL:  65 y.o.-year-old Caucasian female patient lying in the bed with no acute distress.  EYES: Pupils equal, round, reactive to light and accommodation. No scleral icterus. Extraocular muscles intact.  HEENT: Head atraumatic, normocephalic. Oropharynx and nasopharynx clear.  NECK:  Supple, no jugular venous distention. No thyroid enlargement, no tenderness.  LUNGS: Normal breath sounds bilaterally, no wheezing, rales,rhonchi or crepitation. No use of accessory muscles of respiration.  CARDIOVASCULAR: Regular rate and rhythm, S1, S2 normal. No murmurs, rubs, or gallops.  ABDOMEN: Soft, nondistended, nontender. Bowel sounds present. No organomegaly or mass.  EXTREMITIES: No pedal edema, cyanosis, or  clubbing.  NEUROLOGIC: Cranial nerves II through XII are intact. Muscle strength 5/5 in all extremities. Sensation intact. Gait not checked.  PSYCHIATRIC: The patient is alert and oriented x 3.  Normal affect and good eye contact. SKIN: No obvious rash, lesion, or ulcer.   LABORATORY PANEL:   CBC Recent Labs  Lab 05/24/21 2258  WBC 9.9  HGB 10.9*  HCT 35.2*  PLT 265   ------------------------------------------------------------------------------------------------------------------  Chemistries  Recent Labs  Lab 05/24/21 2258  NA 140  K 3.5  CL 100  CO2 28  GLUCOSE 162*  BUN 21  CREATININE 1.04*  CALCIUM 9.1  AST 37  ALT 27  ALKPHOS 152*  BILITOT 0.3   ------------------------------------------------------------------------------------------------------------------  Cardiac Enzymes No results for input(s): TROPONINI in the last 168 hours. ------------------------------------------------------------------------------------------------------------------  RADIOLOGY:  CT ANGIO HEAD NECK W WO CM  Result Date: 05/25/2021 CLINICAL DATA:  Persistent nausea.  Dizziness EXAM: CT ANGIOGRAPHY HEAD AND NECK TECHNIQUE: Multidetector CT imaging of the head and neck was performed using the standard protocol during bolus administration of intravenous contrast. Multiplanar CT image reconstructions and MIPs were obtained to evaluate the vascular anatomy. Carotid stenosis measurements (when applicable) are obtained utilizing NASCET criteria, using the distal internal carotid diameter as the denominator. CONTRAST:  42m OMNIPAQUE IOHEXOL 350 MG/ML SOLN COMPARISON:  None. FINDINGS: CT HEAD FINDINGS Brain: No evidence of acute cortical infarction, hemorrhage, hydrocephalus, extra-axial collection or mass lesion/mass effect. Patchy low-density in the cerebral white matter and deep gray nuclei attributed to chronic small vessel disease Vascular: See below Skull: Normal. Negative for fracture or  focal lesion. 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 Right carotid system: Vessels are smooth and widely patent with no noted atheromatous changes. Left carotid system: Vessels are smooth and widely patent with no noted atheromatous changes. Vertebral arteries: The left vertebral artery arises from the arch. No proximal subclavian stenosis. Diminutive vertebral arteries in the setting of fetal type PCA circulation. Segment of nonenhancing left mid vertebral artery but patent by the dura. Skeleton: Marked cervical spine degeneration with multilevel foraminal impingement and spinal stenosis. Partially covered thoracic scoliosis surgery. Other neck: Unremarkable Upper chest: Unremarkable Review of the MIP images confirms the above findings CTA HEAD FINDINGS Anterior circulation: No significant stenosis, proximal occlusion, aneurysm, or vascular malformation. Posterior circulation: Very diminutive vertebral arteries in the setting of fetal type bilateral PCA flow. No branch occlusion, beading, or aneurysm. Venous sinuses: Diffusely patent Anatomic variants: As above Review of the MIP images confirms the above findings IMPRESSION: 1. No definite cause for symptoms.  2. 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 3. No noted atheromatous disease. 4. Chronic small vessel ischemia seen in the hemispheric white matter. Electronically Signed   By: Jorje Guild M.D.   On: 05/25/2021 04:48   DG Chest 2 View  Result Date: 05/23/2021 CLINICAL DATA:  Shortness of breath and weakness. EXAM: CHEST - 2 VIEW COMPARISON:  January 19, 2020 FINDINGS: Cardiomediastinal silhouette is normal. Mediastinal contours appear intact. Tortuosity of the aorta. There is no evidence of focal airspace consolidation, pleural effusion  or pneumothorax. Osseous structures are without acute abnormality. Soft tissues are grossly normal. IMPRESSION: No active cardiopulmonary disease. Electronically Signed   By: Fidela Salisbury M.D.   On: 05/23/2021 17:07      IMPRESSION AND PLAN:  Active Problems:   Near syncope  1.  Dizziness and nausea with vertigo and presyncope. - The patient will be admitted to an observation medical monitored bed. - We will check orthostatics. - We will monitor her for arrhythmias. - She will be placed on as needed Antivert as she may be having benign positional vertigo. - As needed antiemetics will be provided. - The patient already had a head and neck CTA as mentioned above.  2.  Essential hypertension. - We will continue her antihypertensives.  3.  History of occipital neuralgia. - We will continue Tegretol XR, Lyrica and Neurontin.  4.  Overactive bladder. - We will continue Detrol.  5.  GERD. - We will continue PPI therapy.  6.  Depression. - We will continue Effexor XR  DVT prophylaxis: Lovenox. Code Status: full code. Family Communication:  The plan of care was discussed in details with the patient (and family). I answered all questions. The patient agreed to proceed with the above mentioned plan. Further management will depend upon hospital course. Disposition Plan: Back to previous home environment Consults called: none. All the records are reviewed and case discussed with ED provider.  Status is: Observation  The patient remains OBS appropriate and will d/c before 2 midnights.  Dispo: The patient is from: Home              Anticipated d/c is to: Home              Patient currently is not medically stable to d/c.   Difficult to place patient No   TOTAL TIME TAKING CARE OF THIS PATIENT: 55 minutes.    Christel Mormon M.D on 05/25/2021 at 7:32 AM  [facility]   From 7 PM-7 AM, contact night-coverage [email or web address]  CC: Primary care physician; [facility]

## 2021-05-25 NOTE — ED Notes (Signed)
Patient transported to CT 

## 2021-05-25 NOTE — Evaluation (Signed)
Physical Therapy Evaluation Patient Details Name: Kristi Lee MRN: [id number] DOB: [birth year] Today's Date: 05/25/2021  History of Present Illness  Kristi Lee is a 65 y.o. Caucasian female with medical history significant for essential hypertension, DVT, GERD, depression and scoliosis, who presented to the ER with acute onset of nausea and associated dizziness with presyncope.  She was seen in ER yesterday for similar symptoms and work-up was negative.   Clinical Impression  Pt admitted with above diagnosis. Pt received supine in bed agreeable to PT services. Able to report PLOF, home lay out, DME, without difficulty. Mod-I for bed mobility to t/f to sitting EOB denying dizziness with positional changes. HR at 89 BPM and SPO2 AT 98% on RA. VC's required for hand placement when standing to RW, but otherwise supervision to stand. Pt ambulated 120' with RW initially with step to pattern but progress to step through pattern as ambulation progressed. Pt able to quickly stop and navigate crowded hallways and obstacles without LOB with supervision. Pt reports ambulating at her base line function but is being cautious due to her previous dizziness episodes. Pt returned to room sitting at EoB with HR up to 98 BPM and SPO2 at 98% with minor SOB from ambulation noted. PT educated pt on use of RW temporarily for additional support due to recent dizziness spells when returning to home environment. Pt verbalizing understanding. Otherwise pt has no acute PT needs and orders to be signed off due to pt being at baseline function. Recommend supervision at home with OOB mobility to reduce risk of falls. Please re-consult if change in medical status warrants PT reassessment.      Recommendations for follow up therapy are one component of a multi-disciplinary discharge planning process, led by the attending physician.  Recommendations may be updated based on patient status, additional functional criteria and  insurance authorization.  Follow Up Recommendations No PT follow up;Supervision for mobility/OOB    Equipment Recommendations       Recommendations for Other Services       Precautions / Restrictions Precautions Precautions: Fall Restrictions Weight Bearing Restrictions: No      Mobility  Bed Mobility Overal bed mobility: Modified Independent             General bed mobility comments: Increased time to perform but able to transfer supine to sit with bed flat and no use of bed rail    Transfers Overall transfer level: Needs assistance Equipment used: Rolling walker (2 wheeled) Transfers: Sit to/from Stand Sit to Stand: Supervision            Ambulation/Gait Ambulation/Gait assistance: Supervision Gait Distance (Feet): 120 Feet Assistive device: Rolling walker (2 wheeled) Gait Pattern/deviations: Step-to pattern;Step-through pattern;Decreased step length - right;Decreased step length - left     General Gait Details: Initially step to pattern but able to progress to step through as further ambulation with RW took place.  Stairs            Wheelchair Mobility    Modified Rankin (Stroke Patients Only)       Balance Overall balance assessment: Needs assistance Sitting-balance support: No upper extremity supported;Feet supported Sitting balance-Leahy Scale: Normal       Standing balance-Leahy Scale: Fair Standing balance comment: able to stand at EOB with no AD without LOB               High Level Balance Comments: Able to navigate obstacles and crowded hallway during ambulation  Pertinent Vitals/Pain Pain Assessment: No/denies pain    Home Living Family/patient expects to be discharged to:: Private residence Living Arrangements: Spouse/significant other Available Help at Discharge: Family Type of Home: House Home Access: Stairs to enter Entrance Stairs-Rails: Can reach both Entrance Stairs-Number of Steps: 3 Home  Layout: One level Home Equipment: Shower seat - built in;Walker - 2 wheels;Cane - single point;Toilet riser;Grab bars - tub/shower      Prior Function Level of Independence: Independent               Hand Dominance        Extremity/Trunk Assessment        Lower Extremity Assessment Lower Extremity Assessment: Generalized weakness;Overall [facility] for tasks assessed    Cervical / Trunk Assessment Cervical / Trunk Assessment: Normal  Communication   Communication: No difficulties  Cognition Arousal/Alertness: Awake/alert Behavior During Therapy: WFL for tasks assessed/performed Overall Cognitive Status: Within Functional Limits for tasks assessed                                        General Comments      Exercises Other Exercises Other Exercises: Role of PT in acute setting, d/c recs, DME needs at this point in time.   Assessment/Plan    PT Assessment Patent does not need any further PT services  PT Problem List         PT Treatment Interventions      PT Goals (Current goals can be found in the Care Plan section)  Acute Rehab PT Goals Patient Stated Goal: to go home PT Goal Formulation: With patient Time For Goal Achievement: 06/08/21 Potential to Achieve Goals: Good    Frequency     Barriers to discharge        Co-evaluation               AM-PAC PT "6 Clicks" Mobility  Outcome Measure Help needed turning from your back to your side while in a flat bed without using bedrails?: None Help needed moving from lying on your back to sitting on the side of a flat bed without using bedrails?: None Help needed moving to and from a bed to a chair (including a wheelchair)?: A Little Help needed standing up from a chair using your arms (e.g., wheelchair or bedside chair)?: A Little Help needed to walk in hospital room?: A Little Help needed climbing 3-5 steps with a railing? : A Little 6 Click Score: 20    End of Session Equipment  Utilized During Treatment: Gait belt Activity Tolerance: Patient tolerated treatment well Patient left: in bed;with call bell/phone within reach;with family/visitor present Nurse Communication: Mobility status PT Visit Diagnosis: Dizziness and giddiness (R42)    Time: 1012-1029 PT Time Calculation (min) (ACUTE ONLY): 17 min   Charges:   PT Evaluation $PT Eval Low Complexity: 1 Low PT Treatments $Gait Training: 8-22 mins       Salem Caster. Fairly IV, PT, DPT Physical Therapist- [facility]  05/25/2021, 10:43 AM

## 2021-05-26 DIAGNOSIS — R110 Nausea: Secondary | ICD-10-CM | POA: Diagnosis not present

## 2021-05-26 DIAGNOSIS — D500 Iron deficiency anemia secondary to blood loss (chronic): Secondary | ICD-10-CM

## 2021-05-26 DIAGNOSIS — N179 Acute kidney failure, unspecified: Secondary | ICD-10-CM

## 2021-05-26 DIAGNOSIS — I10 Essential (primary) hypertension: Secondary | ICD-10-CM | POA: Diagnosis not present

## 2021-05-26 DIAGNOSIS — R42 Dizziness and giddiness: Secondary | ICD-10-CM | POA: Diagnosis not present

## 2021-05-26 DIAGNOSIS — E876 Hypokalemia: Secondary | ICD-10-CM

## 2021-05-26 LAB — BASIC METABOLIC PANEL
Anion gap: 7 (ref 5–15)
BUN: 20 mg/dL (ref 8–23)
CO2: 25 mmol/L (ref 22–32)
Calcium: 7.7 mg/dL — ABNORMAL LOW (ref 8.9–10.3)
Chloride: 106 mmol/L (ref 98–111)
Creatinine, Ser: 0.88 mg/dL (ref 0.44–1.00)
GFR, Estimated: >60 mL/min (ref >60)
Glucose, Bld: 140 mg/dL — ABNORMAL HIGH (ref 70–99)
Potassium: 3.5 mmol/L (ref 3.5–5.1)
Sodium: 138 mmol/L (ref 135–145)

## 2021-05-26 LAB — CBC
HCT: 33.1 % — ABNORMAL LOW (ref 36.0–46.0)
Hemoglobin: 10.5 g/dL — ABNORMAL LOW (ref 12.0–15.0)
MCH: 23.3 pg — ABNORMAL LOW (ref 26.0–34.0)
MCHC: 31.7 g/dL (ref 30.0–36.0)
MCV: 73.4 fL — ABNORMAL LOW (ref 80.0–100.0)
Platelets: 249 10*3/uL (ref 150–400)
RBC: 4.51 MIL/uL (ref 3.87–5.11)
RDW: 17.5 % — ABNORMAL HIGH (ref 11.5–15.5)
WBC: 11.6 10*3/uL — ABNORMAL HIGH (ref 4.0–10.5)
nRBC: 0 % (ref 0.0–0.2)

## 2021-05-26 LAB — IRON AND TIBC
Iron: 58 ug/dL (ref 28–170)
Saturation Ratios: 15 % (ref 10.4–31.8)
TIBC: 379 ug/dL (ref 250–450)
UIBC: 321 ug/dL

## 2021-05-26 LAB — URINE CULTURE

## 2021-05-26 LAB — MAGNESIUM: Magnesium: 1.7 mg/dL (ref 1.7–2.4)

## 2021-05-26 LAB — FERRITIN: Ferritin: 11 ng/mL (ref 11–307)

## 2021-05-26 MED ORDER — MAGNESIUM SULFATE 2 GM/50ML IV SOLN
2.0000 g | Freq: Once | INTRAVENOUS | Status: AC
  Administered 2021-05-26: 2 g via INTRAVENOUS
  Filled 2021-05-26: qty 50

## 2021-05-26 MED ORDER — POTASSIUM CHLORIDE CRYS ER 20 MEQ PO TBCR: 20.0000 meq | EXTENDED_RELEASE_TABLET | Freq: Every day | ORAL | 0 refills | Status: DC

## 2021-05-26 MED ORDER — MECLIZINE HCL 12.5 MG PO TABS: 12.5000 mg | ORAL_TABLET | Freq: Three times a day (TID) | ORAL | 0 refills | Status: DC | PRN

## 2021-05-26 MED ORDER — MAGNESIUM OXIDE 400 MG PO CAPS: 400.0000 mg | ORAL_CAPSULE | Freq: Every day | ORAL | 0 refills | Status: DC

## 2021-05-26 NOTE — Care Management Obs Status (Signed)
[place] NOTIFICATION   Patient Details  Name: Kristi Lee MRN: [id number] Date of Birth: 03/07/1956   Medicare Observation Status Notification Given:  Yes    Candie Chroman, LCSW 05/26/2021, 9:24 AM

## 2021-05-26 NOTE — Discharge Summary (Signed)
[facility] NAME: Kristi Lee    MR#:  [id number]  DATE OF BIRTH:  December 11, 1955  DATE OF ADMISSION:  05/25/2021 ADMITTING PHYSICIAN: Christel Mormon, MD  DATE OF DISCHARGE: 05/26/2021 11:41 AM  PRIMARY CARE PHYSICIAN: [facility] [place]    ADMISSION DIAGNOSIS:  Dizziness [R42] Nausea [R11.0] Near syncope [R55] Postural dizziness with presyncope [R42, R55]  DISCHARGE DIAGNOSIS:  Active Problems:   Near syncope   SECONDARY DIAGNOSIS:   Past Medical History:  Diagnosis Date   Arthritis    back   Depression    DVT (deep venous thrombosis) (HCC) 01/25/2020   GERD (gastroesophageal reflux disease)    Hypertension    PONV (postoperative nausea and vomiting)    Scoliosis    reports she has rods in her back    HOSPITAL COURSE:   1.  Vertigo dizziness and nausea.  Patient feeling better after IV fluid hydration.  CT angio of the head and neck were negative for acute stroke.  As needed meclizine given.  Dizziness has resolved. 2.  Acute kidney injury.  Creatinine improved from 1.21 down to 0.88 upon disposition. 3.  Essential hypertension.  Patient on Maxide and atenolol.  Discontinue Lasix. 4.  Hypomagnesemia and hypokalemia.  Replace magnesium IV and give oral magnesium.  Potassium supplementation for 3 more days at home.  If electrolytes continue to be an issue as outpatient can consider discontinuing hydrochlorothiazide. 5.  History of DVT on Xarelto 6.  Depression on venlafaxine 7.  Occipital neuralgia on pregabalin and carbamazepine 8.  GERD on PPI 9.  Iron deficiency anemia with a ferritin of 11.  Hemoglobin 10.5.  Further work-up can be done as outpatient.  Looking back at old records she did have a colonoscopy on 03/23/2020 that did show polyps.  DISCHARGE CONDITIONS:   Satisfactory  CONSULTS OBTAINED:    None  DRUG ALLERGIES:  No Known Allergies  DISCHARGE MEDICATIONS:   Allergies as of  05/26/2021   No Known Allergies      Medication List     STOP taking these medications    benzonatate 200 MG capsule Commonly known as: TESSALON   fluticasone 50 MCG/ACT nasal spray Commonly known as: FLONASE   furosemide 40 MG tablet Commonly known as: Lasix   gabapentin 400 MG capsule Commonly known as: NEURONTIN   promethazine-dextromethorphan 6.25-15 MG/5ML syrup Commonly known as: PROMETHAZINE-DM       TAKE these medications    atenolol 25 MG tablet Commonly known as: TENORMIN Take 1 tablet (25 mg total) by mouth daily.   betamethasone dipropionate 0.05 % cream Apply 1 application topically as needed.   carbamazepine 200 MG 12 hr tablet Commonly known as: TEGRETOL XR Take 200 mg by mouth 2 (two) times daily.   Detrol 2 MG tablet Generic drug: tolterodine Take 1 tablet (2 mg total) by mouth 2 (two) times daily.   hydrOXYzine 10 MG tablet Commonly known as: ATARAX/VISTARIL Take 10 mg by mouth 3 (three) times daily.   Magnesium Oxide 400 MG Caps Take 1 capsule (400 mg total) by mouth daily.   meclizine 12.5 MG tablet Commonly known as: ANTIVERT Take 1 tablet (12.5 mg total) by mouth 3 (three) times daily as needed for dizziness.   omeprazole 20 MG capsule Commonly known as: PRILOSEC Take 1 capsule (20 mg total) by mouth daily.   ondansetron 4 MG tablet Commonly known as: Zofran Take 1 tablet (4 mg total) by mouth  every 8 (eight) hours as needed for up to 10 doses for nausea or vomiting.   potassium chloride SA 20 MEQ tablet Commonly known as: KLOR-CON Take 1 tablet (20 mEq total) by mouth daily for 3 days.   pregabalin 25 MG capsule Commonly known as: LYRICA Take 25 mg by mouth daily.   rivaroxaban 20 MG Tabs tablet Commonly known as: XARELTO Take 1 tablet (20 mg total) by mouth daily with supper.   traZODone 100 MG tablet Commonly known as: DESYREL Take 1 tablet (100 mg total) by mouth at bedtime.   triamterene-hydrochlorothiazide  37.5-25 MG tablet Commonly known as: MAXZIDE-25 Take 1 tablet by mouth daily.   venlafaxine XR 75 MG 24 hr capsule Commonly known as: EFFEXOR-XR Take 1 capsule (75 mg total) by mouth daily.         DISCHARGE INSTRUCTIONS:   Follow-up PMD within 5 days Follow-up Dr. Melrose Nakayama neurology  If you experience worsening of your admission symptoms, develop shortness of breath, life threatening emergency, suicidal or homicidal thoughts you must seek medical attention immediately by calling 911 or calling your MD immediately  if symptoms less severe.  You Must read complete instructions/literature along with all the possible adverse reactions/side effects for all the Medicines you take and that have been prescribed to you. Take any new Medicines after you have completely understood and accept all the possible adverse reactions/side effects.   Please note  You were cared for by a hospitalist during your hospital stay. If you have any questions about your discharge medications or the care you received while you were in the hospital after you are discharged, you can call the unit and asked to speak with the hospitalist on call if the hospitalist that took care of you is not available. Once you are discharged, your primary care physician will handle any further medical issues. Please note that NO REFILLS for any discharge medications will be authorized once you are discharged, as it is imperative that you return to your primary care physician (or establish a relationship with a primary care physician if you do not have one) for your aftercare needs so that they can reassess your need for medications and monitor your lab values.    Today   CHIEF COMPLAINT:   Chief Complaint  Patient presents with   Nausea    HISTORY OF PRESENT ILLNESS:  Kristi Lee  is a 65 y.o. female came in with dizziness and headache   VITAL SIGNS:  Blood pressure 134/78, pulse 88, temperature 98.5 F (36.9 C),  temperature source Oral, resp. rate 19, height 5\' 6"  (1.676 m), weight 81.6 kg, SpO2 94 %.  I/O:   Intake/Output Summary (Last 24 hours) at 05/26/2021 1640 Last data filed at 05/26/2021 1018 Gross per 24 hour  Intake 2091.73 ml  Output --  Net 2091.73 ml    PHYSICAL EXAMINATION:  GENERAL:  65 y.o.-year-old patient lying in the bed with no acute distress.  EYES: Pupils equal, round, reactive to light and accommodation. No scleral icterus. Extraocular muscles intact.  HEENT: Head atraumatic, normocephalic. Oropharynx and nasopharynx clear.  Tympanic membrane bilaterally normal-appearing  LUNGS: Normal breath sounds bilaterally, no wheezing, rales,rhonchi or crepitation. No use of accessory muscles of respiration.  CARDIOVASCULAR: S1, S2 normal. No murmurs, rubs, or gallops.  ABDOMEN: Soft, non-tender, non-distended. Bowel sounds present. No organomegaly or mass.  EXTREMITIES: No pedal edema, cyanosis, or clubbing.  NEUROLOGIC: Cranial nerves II through XII are intact. Muscle strength 5/5 in all extremities. Sensation  intact. Gait not checked.  PSYCHIATRIC: The patient is alert and oriented x 3.  SKIN: No obvious rash, lesion, or ulcer.   DATA REVIEW:   CBC Recent Labs  Lab 05/26/21 0442  WBC 11.6*  HGB 10.5*  HCT 33.1*  PLT 249    Chemistries  Recent Labs  Lab 05/24/21 2258 05/26/21 0442  NA 140 138  K 3.5 3.5  CL 100 106  CO2 28 25  GLUCOSE 162* 140*  BUN 21 20  CREATININE 1.04* 0.88  CALCIUM 9.1 7.7*  MG  --  1.7  AST 37  --   ALT 27  --   ALKPHOS 152*  --   BILITOT 0.3  --     Microbiology Results  Results for orders placed or performed during the hospital encounter of 05/25/21  Resp Panel by RT-PCR (Flu A&B, Covid) Nasopharyngeal Swab     Status: None   Collection Time: 05/25/21  5:20 AM   Specimen: Nasopharyngeal Swab; Nasopharyngeal(NP) swabs in vial transport medium  Result Value Ref Range Status   SARS Coronavirus 2 by RT PCR NEGATIVE NEGATIVE Final     Comment: (NOTE) SARS-CoV-2 target nucleic acids are NOT DETECTED.  The SARS-CoV-2 RNA is generally detectable in upper respiratory specimens during the acute phase of infection. The lowest concentration of SARS-CoV-2 viral copies this assay can detect is 138 copies/mL. A negative result does not preclude SARS-Cov-2 infection and should not be used as the sole basis for treatment or other patient management decisions. A negative result may occur with  improper specimen collection/handling, submission of specimen other than nasopharyngeal swab, presence of viral mutation(s) within the areas targeted by this assay, and inadequate number of viral copies(<138 copies/mL). A negative result must be combined with clinical observations, patient history, and epidemiological information. The expected result is Negative.  Fact Sheet for Patients:  EntrepreneurPulse.com.au  Fact Sheet for Healthcare Providers:  IncredibleEmployment.be  This test is no t yet approved or cleared by the [place] FDA and  has been authorized for detection and/or diagnosis of SARS-CoV-2 by FDA under an Emergency Use Authorization (EUA). This EUA will remain  in effect (meaning this test can be used) for the duration of the COVID-19 declaration under Section 564(b)(1) of the Act, 21 U.S.C.section 360bbb-3(b)(1), unless the authorization is terminated  or revoked sooner.       Influenza A by PCR NEGATIVE NEGATIVE Final   Influenza B by PCR NEGATIVE NEGATIVE Final    Comment: (NOTE) The Xpert Xpress SARS-CoV-2/FLU/RSV plus assay is intended as an aid in the diagnosis of influenza from Nasopharyngeal swab specimens and should not be used as a sole basis for treatment. Nasal washings and aspirates are unacceptable for Xpert Xpress SARS-CoV-2/FLU/RSV testing.  Fact Sheet for Patients: EntrepreneurPulse.com.au  Fact Sheet for Healthcare  Providers: IncredibleEmployment.be  This test is not yet approved or cleared by the [place] FDA and has been authorized for detection and/or diagnosis of SARS-CoV-2 by FDA under an Emergency Use Authorization (EUA). This EUA will remain in effect (meaning this test can be used) for the duration of the COVID-19 declaration under Section 564(b)(1) of the Act, 21 U.S.C. section 360bbb-3(b)(1), unless the authorization is terminated or revoked.  Performed at [facility], [address] [place], [place] [address]   Urine Culture     Status: Abnormal   Collection Time: 05/25/21 10:45 AM   Specimen: Urine, Clean Catch  Result Value Ref Range Status   Specimen Description  Final    URINE, CLEAN CATCH Performed at [facility], [address] [place], [place] [address]    Special Requests   Final    NONE Performed at [facility], [address] [place], [place] [address]    Culture MULTIPLE SPECIES PRESENT, SUGGEST RECOLLECTION (A)  Final   Report Status 05/26/2021 FINAL  Final    RADIOLOGY:  CT ANGIO HEAD NECK W WO CM  Result Date: 05/25/2021 CLINICAL DATA:  Persistent nausea.  Dizziness EXAM: CT ANGIOGRAPHY HEAD AND NECK TECHNIQUE: Multidetector CT imaging of the head and neck was performed using the standard protocol during bolus administration of intravenous contrast. Multiplanar CT image reconstructions and MIPs were obtained to evaluate the vascular anatomy. Carotid stenosis measurements (when applicable) are obtained utilizing NASCET criteria, using the distal internal carotid diameter as the denominator. CONTRAST:  67mL OMNIPAQUE IOHEXOL 350 MG/ML SOLN COMPARISON:  None. FINDINGS: CT HEAD FINDINGS Brain: No evidence of acute cortical infarction, hemorrhage, hydrocephalus, extra-axial collection or mass lesion/mass effect. Patchy low-density in the cerebral white matter and deep gray nuclei attributed to chronic small vessel  disease Vascular: See below Skull: Normal. Negative for fracture or focal lesion. 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 Right carotid system: Vessels are smooth and widely patent with no noted atheromatous changes. Left carotid system: Vessels are smooth and widely patent with no noted atheromatous changes. Vertebral arteries: The left vertebral artery arises from the arch. No proximal subclavian stenosis. Diminutive vertebral arteries in the setting of fetal type PCA circulation. Segment of nonenhancing left mid vertebral artery but patent by the dura. Skeleton: Marked cervical spine degeneration with multilevel foraminal impingement and spinal stenosis. Partially covered thoracic scoliosis surgery. Other neck: Unremarkable Upper chest: Unremarkable Review of the MIP images confirms the above findings CTA HEAD FINDINGS Anterior circulation: No significant stenosis, proximal occlusion, aneurysm, or vascular malformation. Posterior circulation: Very diminutive vertebral arteries in the setting of fetal type bilateral PCA flow. No branch occlusion, beading, or aneurysm. Venous sinuses: Diffusely patent Anatomic variants: As above Review of the MIP images confirms the above findings IMPRESSION: 1. No definite cause for symptoms. 2. 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 3. No noted atheromatous disease. 4. Chronic small vessel ischemia seen in the hemispheric white matter. Electronically Signed   By: Jorje Guild M.D.   On: 05/25/2021 04:48   DG Abd Portable 1V  Result Date: 05/25/2021 CLINICAL DATA:  Nausea and vomiting. EXAM: PORTABLE ABDOMEN - 1 VIEW COMPARISON:  None. FINDINGS: The bowel gas pattern is normal. No radio-opaque calculi or other significant radiographic abnormality are  seen. No gaseous small bowel dilatation to suggest obstruction. Air in stool are seen scattered along the length of the nondilated colon. Extensive thoracolumbar fusion hardware evident. Patient is status post bilateral hip replacement. IMPRESSION: No evidence for bowel obstruction. Electronically Signed   By: Misty Stanley M.D.   On: 05/25/2021 11:37       Management plans discussed with the patient, and she is in agreement.  CODE STATUS:     Code Status Orders  (From admission, onward)           Start     Ordered   05/25/21 0729  Full code  Continuous        05/25/21 0731  Code Status History     This patient has a current code status but no historical code status.      Advance Directive Documentation    [facility] Most Recent Value  Type of Advance Directive Living will  Pre-existing out of facility DNR order (yellow form or pink MOST form) --  "MOST" Form in Place? --       TOTAL TIME TAKING CARE OF THIS PATIENT: 32 minutes.    Loletha Grayer M.D on 05/26/2021 at 4:40 PM   [facility]  CC: Primary care physician; [facility]

## 2021-05-26 NOTE — TOC CM/SW Note (Signed)
Patient has orders to discharge home today. Chart reviewed. PCP is [facility]. On room air. No wounds. No TOC needs identified. CSW signing off.  [place]ton Scrape, [place]

## 2021-06-08 ENCOUNTER — Encounter (INDEPENDENT_AMBULATORY_CARE_PROVIDER_SITE_OTHER): Payer: Self-pay | Admitting: Vascular Surgery

## 2021-06-08 ENCOUNTER — Ambulatory Visit (INDEPENDENT_AMBULATORY_CARE_PROVIDER_SITE_OTHER): Payer: Medicare Other | Admitting: Vascular Surgery

## 2021-06-08 ENCOUNTER — Other Ambulatory Visit: Payer: Self-pay

## 2021-06-08 VITALS — BP 120/72 | HR 93 | Ht 66.0 in | Wt 180.0 lb

## 2021-06-08 DIAGNOSIS — I10 Essential (primary) hypertension: Secondary | ICD-10-CM | POA: Diagnosis not present

## 2021-06-08 DIAGNOSIS — E782 Mixed hyperlipidemia: Secondary | ICD-10-CM

## 2021-06-08 DIAGNOSIS — I825Y2 Chronic embolism and thrombosis of unspecified deep veins of left proximal lower extremity: Secondary | ICD-10-CM

## 2021-06-08 NOTE — Assessment & Plan Note (Signed)
blood pressure control important in reducing the progression of atherosclerotic disease. On appropriate oral medications.  

## 2021-06-08 NOTE — Assessment & Plan Note (Signed)
lipid control important in reducing the progression of atherosclerotic disease. Continue statin therapy  

## 2021-06-08 NOTE — Assessment & Plan Note (Signed)
The patient had thrombectomy for severe DVT last year.  She has minimal postphlebitic symptoms that are well controlled with compression socks and elevation.  She is tolerating anticoagulation and should stay on this long-term as she does have factor V Leiden as well.  At this point, I will see her back as needed.

## 2021-06-08 NOTE — Progress Notes (Signed)
MRN : [id number]  Kristi Lee is a 65 y.o. ([birth year]) female who presents with chief complaint of  Chief Complaint  Patient presents with   Follow-up    1 yr no studies  .  History of Present Illness: Patient returns today in follow up of her DVT.  She is about a year and a half status post thrombectomy of the left lower extremity for extensive DVT.  She is doing well.  She is tolerating anticoagulation well.  She has factor V Leiden and will remain on anticoagulation.  She does wear compression socks and her swelling is pretty mild.  Current Outpatient Medications  Medication Sig Dispense Refill   atenolol (TENORMIN) 25 MG tablet Take 1 tablet (25 mg total) by mouth daily. 90 tablet 1   betamethasone dipropionate 0.05 % cream Apply 1 application topically as needed.      DETROL 2 MG tablet Take 1 tablet (2 mg total) by mouth 2 (two) times daily. 60 tablet 0   hydrOXYzine (ATARAX/VISTARIL) 10 MG tablet Take 10 mg by mouth 3 (three) times daily.     omeprazole (PRILOSEC) 20 MG capsule Take 1 capsule (20 mg total) by mouth daily. 90 capsule 1   pregabalin (LYRICA) 25 MG capsule Take 25 mg by mouth daily.     rivaroxaban (XARELTO) 20 MG TABS tablet Take 1 tablet (20 mg total) by mouth daily with supper. 90 tablet 1   traZODone (DESYREL) 100 MG tablet Take 1 tablet (100 mg total) by mouth at bedtime. 90 tablet 1   triamterene-hydrochlorothiazide (MAXZIDE-25) 37.5-25 MG tablet Take 1 tablet by mouth daily. 90 tablet 1   venlafaxine XR (EFFEXOR-XR) 75 MG 24 hr capsule Take 1 capsule (75 mg total) by mouth daily. 90 capsule 1   carbamazepine (TEGRETOL XR) 200 MG 12 hr tablet Take 200 mg by mouth 2 (two) times daily. (Patient not taking: Reported on 06/08/2021)     Magnesium Oxide 400 MG CAPS Take 1 capsule (400 mg total) by mouth daily. (Patient not taking: Reported on 06/08/2021) 30 capsule 0   meclizine (ANTIVERT) 12.5 MG tablet Take 1 tablet (12.5 mg total) by mouth 3 (three) times  daily as needed for dizziness. (Patient not taking: Reported on 06/08/2021) 20 tablet 0   ondansetron (ZOFRAN) 4 MG tablet Take 1 tablet (4 mg total) by mouth every 8 (eight) hours as needed for up to 10 doses for nausea or vomiting. (Patient not taking: Reported on 06/08/2021) 10 tablet 0   potassium chloride SA (KLOR-CON) 20 MEQ tablet Take 1 tablet (20 mEq total) by mouth daily for 3 days. 3 tablet 0   No current facility-administered medications for this visit.    Past Medical History:  Diagnosis Date   Arthritis    back   Depression    DVT (deep venous thrombosis) ([place]) 01/25/2020   GERD (gastroesophageal reflux disease)    Hypertension    PONV (postoperative nausea and vomiting)    Scoliosis    reports she has rods in her back    Past Surgical History:  Procedure Laterality Date   ABDOMINAL HYSTERECTOMY     BACK SURGERY  10/2000   BREAST EXCISIONAL BIOPSY Right 1990's   NEG   BROW LIFT Bilateral 07/23/2020   Procedure: BLEPHAROPLASTY UPPER EYELID; W/EXCESS SKIN BLEPHAROPTOSIS REPAIR; RESECT EX BILATERAL;  Surgeon: Karle Starch, MD;  Location: [facility];  Service: Ophthalmology;  Laterality: Bilateral;   COLONOSCOPY WITH PROPOFOL N/A 03/23/2020   Procedure:  COLONOSCOPY WITH PROPOFOL;  Surgeon: Lucilla Lame, MD;  Location: [facility];  Service: Endoscopy;  Laterality: N/A;  priority 4   FEMUR FRACTURE SURGERY Right 07/15/2009   Pertrochanteric femur fracture.   HIP FRACTURE SURGERY Right    PERIPHERAL VASCULAR THROMBECTOMY Left 11/11/2019   Procedure: PERIPHERAL VASCULAR THROMBECTOMY;  Surgeon: Algernon Huxley, MD;  Location: [facility];  Service: Cardiovascular;  Laterality: Left;   POLYPECTOMY  03/23/2020   Procedure: POLYPECTOMY;  Surgeon: Lucilla Lame, MD;  Location: [facility];  Service: Endoscopy;;   REVISION TOTAL HIP ARTHROPLASTY Left      Social History   Tobacco Use   Smoking status: Never   Smokeless tobacco: Never  Vaping Use    Vaping Use: Never used  Substance Use Topics   Alcohol use: No   Drug use: No      Family History  Problem Relation Age of Onset   Osteoarthritis Mother    Lung cancer Father    Diabetes Brother    Breast cancer Neg Hx      No Known Allergies   REVIEW OF SYSTEMS (Negative unless checked)  Constitutional: [] Weight loss  [] Fever  [] Chills Cardiac: [] Chest pain   [] Chest pressure   [] Palpitations   [] Shortness of breath when laying flat   [] Shortness of breath at rest   [] Shortness of breath with exertion. Vascular:  [] Pain in legs with walking   [] Pain in legs at rest   [] Pain in legs when laying flat   [] Claudication   [] Pain in feet when walking  [] Pain in feet at rest  [] Pain in feet when laying flat   [x] History of DVT   [] Phlebitis   [x] Swelling in legs   [] Varicose veins   [] Non-healing ulcers Pulmonary:   [] Uses home oxygen   [] Productive cough   [] Hemoptysis   [] Wheeze  [] COPD   [] Asthma Neurologic:  [] Dizziness  [] Blackouts   [] Seizures   [] History of stroke   [] History of TIA  [] Aphasia   [] Temporary blindness   [] Dysphagia   [] Weakness or numbness in arms   [] Weakness or numbness in legs Musculoskeletal:  [x] Arthritis   [] Joint swelling   [] Joint pain   [x] Low back pain Hematologic:  [] Easy bruising  [] Easy bleeding   [] Hypercoagulable state   [] Anemic   Gastrointestinal:  [] Blood in stool   [] Vomiting blood  [x] Gastroesophageal reflux/heartburn   [] Abdominal pain Genitourinary:  [] Chronic kidney disease   [] Difficult urination  [] Frequent urination  [] Burning with urination   [] Hematuria Skin:  [] Rashes   [] Ulcers   [] Wounds Psychological:  [] History of anxiety   []  History of major depression.  Physical Examination  BP 120/72   Pulse 93   Ht 5\' 6"  (1.676 m)   Wt 180 lb (81.6 kg)   BMI 29.05 kg/m  Gen:  WD/WN, NAD Head: [place]/AT, No temporalis wasting. Ear/Nose/Throat: Hearing grossly intact, nares w/o erythema or drainage Eyes: Conjunctiva clear. Sclera  non-icteric Neck: Supple.  Trachea midline Pulmonary:  Good air movement, no use of accessory muscles.  Cardiac: RRR, no JVD Vascular:  Vessel Right Left  Radial Palpable Palpable           Musculoskeletal: M/S 5/5 throughout.  No deformity or atrophy. Trace LLE edema. Neurologic: Sensation grossly intact in extremities.  Symmetrical.  Speech is fluent.  Psychiatric: Judgment intact, Mood & affect appropriate for pt's clinical situation. Dermatologic: No rashes or ulcers noted.  No cellulitis or open wounds.      Labs  Recent Results (from the past 2160 hour(s))  Comprehensive metabolic panel     Status: Abnormal   Collection Time: 05/23/21  4:26 PM  Result Value Ref Range   Sodium 140 135 - 145 mmol/L   Potassium 3.9 3.5 - 5.1 mmol/L   Chloride 99 98 - 111 mmol/L   CO2 29 22 - 32 mmol/L   Glucose, Bld 172 (H) 70 - 99 mg/dL    Comment: Glucose reference range applies only to samples taken after fasting for at least 8 hours.   BUN 20 8 - 23 mg/dL   Creatinine, Ser 1.21 (H) 0.44 - 1.00 mg/dL   Calcium 9.5 8.9 - 10.3 mg/dL   Total Protein 7.7 6.5 - 8.1 g/dL   Albumin 4.5 3.5 - 5.0 g/dL   AST 32 15 - 41 U/L   ALT 26 0 - 44 U/L   Alkaline Phosphatase 171 (H) 38 - 126 U/L   Total Bilirubin 0.7 0.3 - 1.2 mg/dL   GFR, Estimated 49 (L) >60 mL/min    Comment: (NOTE) Calculated using the CKD-EPI Creatinine Equation (2021)    Anion gap 12 5 - 15    Comment: Performed at [facility], [address] [place], [place] [address]  CBC     Status: Abnormal   Collection Time: 05/23/21  4:26 PM  Result Value Ref Range   WBC 11.9 (H) 4.0 - 10.5 K/uL   RBC 4.84 3.87 - 5.11 MIL/uL   Hemoglobin 10.9 (L) 12.0 - 15.0 g/dL   HCT 35.7 (L) 36.0 - 46.0 %   MCV 73.8 (L) 80.0 - 100.0 fL   MCH 22.5 (L) 26.0 - 34.0 pg   MCHC 30.5 30.0 - 36.0 g/dL   RDW 17.4 (H) 11.5 - 15.5 %   Platelets 278 150 - 400 K/uL   nRBC 0.0 0.0 - 0.2 %    Comment: Performed at [facility], [address], [place], [place] [address]  Resp Panel by RT-PCR (Flu A&B, Covid) Nasopharyngeal Swab     Status: None   Collection Time: 05/23/21  4:44 PM   Specimen: Nasopharyngeal Swab; Nasopharyngeal(NP) swabs in vial transport medium  Result Value Ref Range   SARS Coronavirus 2 by RT PCR NEGATIVE NEGATIVE    Comment: (NOTE) SARS-CoV-2 target nucleic acids are NOT DETECTED.  The SARS-CoV-2 RNA is generally detectable in upper respiratory specimens during the acute phase of infection. The lowest concentration of SARS-CoV-2 viral copies this assay can detect is 138 copies/mL. A negative result does not preclude SARS-Cov-2 infection and should not be used as the sole basis for treatment or other patient management decisions. A negative result may occur with  improper specimen collection/handling, submission of specimen other than nasopharyngeal swab, presence of viral mutation(s) within the areas targeted by this assay, and inadequate number of viral copies(<138 copies/mL). A negative result must be combined with clinical observations, patient history, and epidemiological information. The expected result is Negative.  Fact Sheet for Patients:  EntrepreneurPulse.com.au  Fact Sheet for Healthcare Providers:  IncredibleEmployment.be  This test is no t yet approved or cleared by the [place] FDA and  has been authorized for detection and/or diagnosis of SARS-CoV-2 by FDA under an Emergency Use Authorization (EUA). This EUA will remain  in effect (meaning this test can be used) for the duration of the COVID-19 declaration under Section 564(b)(1) of the Act, 21 U.S.C.section 360bbb-3(b)(1), unless the authorization is terminated  or revoked sooner.  Influenza A by PCR NEGATIVE NEGATIVE   Influenza B by PCR NEGATIVE NEGATIVE    Comment: (NOTE) The Xpert Xpress SARS-CoV-2/FLU/RSV plus assay is intended as an aid in the diagnosis of influenza  from Nasopharyngeal swab specimens and should not be used as a sole basis for treatment. Nasal washings and aspirates are unacceptable for Xpert Xpress SARS-CoV-2/FLU/RSV testing.  Fact Sheet for Patients: EntrepreneurPulse.com.au  Fact Sheet for Healthcare Providers: IncredibleEmployment.be  This test is not yet approved or cleared by the [place] FDA and has been authorized for detection and/or diagnosis of SARS-CoV-2 by FDA under an Emergency Use Authorization (EUA). This EUA will remain in effect (meaning this test can be used) for the duration of the COVID-19 declaration under Section 564(b)(1) of the Act, 21 U.S.C. section 360bbb-3(b)(1), unless the authorization is terminated or revoked.  Performed at [facility], [address] [place], [place] [address]   Urinalysis, Complete w Microscopic     Status: Abnormal   Collection Time: 05/23/21  5:00 PM  Result Value Ref Range   Color, Urine YELLOW (A) YELLOW   APPearance HAZY (A) CLEAR   Specific Gravity, Urine 1.014 1.005 - 1.030   pH 6.0 5.0 - 8.0   Glucose, UA NEGATIVE NEGATIVE mg/dL   Hgb urine dipstick NEGATIVE NEGATIVE   Bilirubin Urine NEGATIVE NEGATIVE   Ketones, ur NEGATIVE NEGATIVE mg/dL   Protein, ur NEGATIVE NEGATIVE mg/dL   Nitrite NEGATIVE NEGATIVE   Leukocytes,Ua NEGATIVE NEGATIVE   RBC / HPF 0-5 0 - 5 RBC/hpf   WBC, UA 0-5 0 - 5 WBC/hpf   Bacteria, UA NONE SEEN NONE SEEN   Squamous Epithelial / LPF 6-10 0 - 5   Hyaline Casts, UA PRESENT     Comment: Performed at [facility], [address] [place], [place] [address]  Lipase, blood     Status: None   Collection Time: 05/23/21  5:53 PM  Result Value Ref Range   Lipase 37 11 - 51 U/L    Comment: Performed at [facility], [address] [place], [place] [address]  CBC     Status: Abnormal   Collection Time: 05/24/21 10:58 PM  Result Value Ref Range   WBC 9.9 4.0 - 10.5 K/uL    RBC 4.81 3.87 - 5.11 MIL/uL   Hemoglobin 10.9 (L) 12.0 - 15.0 g/dL   HCT 35.2 (L) 36.0 - 46.0 %   MCV 73.2 (L) 80.0 - 100.0 fL   MCH 22.7 (L) 26.0 - 34.0 pg   MCHC 31.0 30.0 - 36.0 g/dL   RDW 17.5 (H) 11.5 - 15.5 %   Platelets 265 150 - 400 K/uL   nRBC 0.0 0.0 - 0.2 %    Comment: Performed at [facility], [address] [place], [place] [address]  Comprehensive metabolic panel     Status: Abnormal   Collection Time: 05/24/21 10:58 PM  Result Value Ref Range   Sodium 140 135 - 145 mmol/L   Potassium 3.5 3.5 - 5.1 mmol/L   Chloride 100 98 - 111 mmol/L   CO2 28 22 - 32 mmol/L   Glucose, Bld 162 (H) 70 - 99 mg/dL    Comment: Glucose reference range applies only to samples taken after fasting for at least 8 hours.   BUN 21 8 - 23 mg/dL   Creatinine, Ser 1.04 (H) 0.44 - 1.00 mg/dL   Calcium 9.1 8.9 - 10.3 mg/dL   Total Protein 7.5 6.5 - 8.1 g/dL  Albumin 4.1 3.5 - 5.0 g/dL   AST 37 15 - 41 U/L   ALT 27 0 - 44 U/L   Alkaline Phosphatase 152 (H) 38 - 126 U/L   Total Bilirubin 0.3 0.3 - 1.2 mg/dL   GFR, Estimated 59 (L) >60 mL/min    Comment: (NOTE) Calculated using the CKD-EPI Creatinine Equation (2021)    Anion gap 12 5 - 15    Comment: Performed at [facility], [address] [place], [place] [address]  Lipase, blood     Status: None   Collection Time: 05/24/21 10:58 PM  Result Value Ref Range   Lipase 30 11 - 51 U/L    Comment: Performed at [facility], [place], [place] [address]  Troponin I (High Sensitivity)     Status: None   Collection Time: 05/24/21 10:58 PM  Result Value Ref Range   Troponin I (High Sensitivity) 7 <18 ng/L    Comment: (NOTE) Elevated high sensitivity troponin I (hsTnI) values and significant  changes across serial measurements may suggest ACS but many other  chronic and acute conditions are known to elevate hsTnI results.  Refer to the "Links" section for chest pain algorithms and additional   guidance. Performed at [facility], [address] [place], [place] [address]   Resp Panel by RT-PCR (Flu A&B, Covid) Nasopharyngeal Swab     Status: None   Collection Time: 05/25/21  5:20 AM   Specimen: Nasopharyngeal Swab; Nasopharyngeal(NP) swabs in vial transport medium  Result Value Ref Range   SARS Coronavirus 2 by RT PCR NEGATIVE NEGATIVE    Comment: (NOTE) SARS-CoV-2 target nucleic acids are NOT DETECTED.  The SARS-CoV-2 RNA is generally detectable in upper respiratory specimens during the acute phase of infection. The lowest concentration of SARS-CoV-2 viral copies this assay can detect is 138 copies/mL. A negative result does not preclude SARS-Cov-2 infection and should not be used as the sole basis for treatment or other patient management decisions. A negative result may occur with  improper specimen collection/handling, submission of specimen other than nasopharyngeal swab, presence of viral mutation(s) within the areas targeted by this assay, and inadequate number of viral copies(<138 copies/mL). A negative result must be combined with clinical observations, patient history, and epidemiological information. The expected result is Negative.  Fact Sheet for Patients:  EntrepreneurPulse.com.au  Fact Sheet for Healthcare Providers:  IncredibleEmployment.be  This test is no t yet approved or cleared by the [place] FDA and  has been authorized for detection and/or diagnosis of SARS-CoV-2 by FDA under an Emergency Use Authorization (EUA). This EUA will remain  in effect (meaning this test can be used) for the duration of the COVID-19 declaration under Section 564(b)(1) of the Act, 21 U.S.C.section 360bbb-3(b)(1), unless the authorization is terminated  or revoked sooner.       Influenza A by PCR NEGATIVE NEGATIVE   Influenza B by PCR NEGATIVE NEGATIVE    Comment: (NOTE) The Xpert Xpress SARS-CoV-2/FLU/RSV plus  assay is intended as an aid in the diagnosis of influenza from Nasopharyngeal swab specimens and should not be used as a sole basis for treatment. Nasal washings and aspirates are unacceptable for Xpert Xpress SARS-CoV-2/FLU/RSV testing.  Fact Sheet for Patients: EntrepreneurPulse.com.au  Fact Sheet for Healthcare Providers: IncredibleEmployment.be  This test is not yet approved or cleared by the [place] FDA and has been authorized for detection and/or diagnosis of SARS-CoV-2 by FDA under an Emergency Use Authorization (EUA). This EUA  will remain in effect (meaning this test can be used) for the duration of the COVID-19 declaration under Section 564(b)(1) of the Act, 21 U.S.C. section 360bbb-3(b)(1), unless the authorization is terminated or revoked.  Performed at [facility], [place], [place] [address]   HIV Antibody (routine testing w rflx)     Status: None   Collection Time: 05/25/21  8:31 AM  Result Value Ref Range   HIV Screen 4th Generation wRfx Non Reactive Non Reactive    Comment: Performed at [facility], [place] [address] [place], [place] [address]  Urine Culture     Status: Abnormal   Collection Time: 05/25/21 10:45 AM   Specimen: Urine, Clean Catch  Result Value Ref Range   Specimen Description      URINE, CLEAN CATCH Performed at [facility], [address] [place], [place] [address]    Special Requests      NONE Performed at [facility], [address] [place], [place] [address]    Culture MULTIPLE SPECIES PRESENT, SUGGEST RECOLLECTION (A)    Report Status 05/26/2021 FINAL   Basic metabolic panel     Status: Abnormal   Collection Time: 05/26/21  4:42 AM  Result Value Ref Range   Sodium 138 135 - 145 mmol/L   Potassium 3.5 3.5 - 5.1 mmol/L   Chloride 106 98 - 111 mmol/L   CO2 25 22 - 32 mmol/L   Glucose, Bld 140 (H) 70 - 99 mg/dL    Comment: Glucose reference  range applies only to samples taken after fasting for at least 8 hours.   BUN 20 8 - 23 mg/dL   Creatinine, Ser 0.88 0.44 - 1.00 mg/dL   Calcium 7.7 (L) 8.9 - 10.3 mg/dL   GFR, Estimated >60 >60 mL/min    Comment: (NOTE) Calculated using the CKD-EPI Creatinine Equation (2021)    Anion gap 7 5 - 15    Comment: Performed at [facility], [address] [place], [place] [address]  CBC     Status: Abnormal   Collection Time: 05/26/21  4:42 AM  Result Value Ref Range   WBC 11.6 (H) 4.0 - 10.5 K/uL   RBC 4.51 3.87 - 5.11 MIL/uL   Hemoglobin 10.5 (L) 12.0 - 15.0 g/dL   HCT 33.1 (L) 36.0 - 46.0 %   MCV 73.4 (L) 80.0 - 100.0 fL   MCH 23.3 (L) 26.0 - 34.0 pg   MCHC 31.7 30.0 - 36.0 g/dL   RDW 17.5 (H) 11.5 - 15.5 %   Platelets 249 150 - 400 K/uL   nRBC 0.0 0.0 - 0.2 %    Comment: Performed at [facility], [address], [place], [place] [address]  Magnesium     Status: None   Collection Time: 05/26/21  4:42 AM  Result Value Ref Range   Magnesium 1.7 1.7 - 2.4 mg/dL    Comment: Performed at [facility], [address] [place], [place] [address]  Iron and TIBC     Status: None   Collection Time: 05/26/21  4:42 AM  Result Value Ref Range   Iron 58 28 - 170 ug/dL   TIBC 379 250 - 450 ug/dL   Saturation Ratios 15 10.4 - 31.8 %   UIBC 321 ug/dL    Comment: Performed at [facility], [address] [place], [place] [address]  Ferritin     Status: None   Collection Time: 05/26/21  4:42 AM  Result Value Ref Range   Ferritin 11 11 - 307 ng/mL    Comment: Performed at [facility], [address] [place], [place] [address]    Radiology CT [facility] HEAD NECK W WO CM  Result Date: 05/25/2021 CLINICAL DATA:  Persistent nausea.  Dizziness EXAM: CT ANGIOGRAPHY HEAD AND NECK TECHNIQUE: Multidetector CT imaging of the head and neck was performed using the standard protocol during bolus administration of intravenous contrast. Multiplanar CT  image reconstructions and MIPs were obtained to evaluate the vascular anatomy. Carotid stenosis measurements (when applicable) are obtained utilizing NASCET criteria, using the distal internal carotid diameter as the denominator. CONTRAST:  58mL OMNIPAQUE IOHEXOL 350 MG/ML SOLN COMPARISON:  None. FINDINGS: CT HEAD FINDINGS Brain: No evidence of acute cortical infarction, hemorrhage, hydrocephalus, extra-axial collection or mass lesion/mass effect. Patchy low-density in the cerebral white matter and deep gray nuclei attributed to chronic small vessel disease Vascular: See below Skull: Normal. Negative for fracture or focal lesion. 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 Right carotid system: Vessels are smooth and widely patent with no noted atheromatous changes. Left carotid system: Vessels are smooth and widely patent with no noted atheromatous changes. Vertebral arteries: The left vertebral artery arises from the arch. No proximal subclavian stenosis. Diminutive vertebral arteries in the setting of fetal type PCA circulation. Segment of nonenhancing left mid vertebral artery but patent by the dura. Skeleton: Marked cervical spine degeneration with multilevel foraminal impingement and spinal stenosis. Partially covered thoracic scoliosis surgery. Other neck: Unremarkable Upper chest: Unremarkable Review of the MIP images confirms the above findings CTA HEAD FINDINGS Anterior circulation: No significant stenosis, proximal occlusion, aneurysm, or vascular malformation. Posterior circulation: Very diminutive vertebral arteries in the setting of fetal type bilateral PCA flow. No branch occlusion, beading, or aneurysm. Venous sinuses: Diffusely patent Anatomic variants: As above Review of the MIP images confirms the above findings IMPRESSION: 1. No definite cause for  symptoms. 2. 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 3. No noted atheromatous disease. 4. Chronic small vessel ischemia seen in the hemispheric white matter. Electronically Signed   By: Jorje Guild M.D.   On: 05/25/2021 04:48   DG Chest 2 View  Result Date: 05/23/2021 CLINICAL DATA:  Shortness of breath and weakness. EXAM: CHEST - 2 VIEW COMPARISON:  January 19, 2020 FINDINGS: Cardiomediastinal silhouette is normal. Mediastinal contours appear intact. Tortuosity of the aorta. There is no evidence of focal airspace consolidation, pleural effusion or pneumothorax. Osseous structures are without acute abnormality. Soft tissues are grossly normal. IMPRESSION: No active cardiopulmonary disease. Electronically Signed   By: Fidela Salisbury M.D.   On: 05/23/2021 17:07   DG Abd Portable 1V  Result Date: 05/25/2021 CLINICAL DATA:  Nausea and vomiting. EXAM: PORTABLE ABDOMEN - 1 VIEW COMPARISON:  None. FINDINGS: The bowel gas pattern is normal. No radio-opaque calculi or other significant radiographic abnormality are seen. No gaseous small bowel dilatation to suggest obstruction. Air in stool are seen scattered along the length of the nondilated colon. Extensive thoracolumbar fusion hardware evident. Patient is status post bilateral hip replacement. IMPRESSION: No evidence for bowel obstruction. Electronically Signed   By: Misty Stanley M.D.   On: 05/25/2021 11:37    Assessment/Plan  DVT (deep venous thrombosis) ([place]) The patient had thrombectomy for severe DVT last year.  She has minimal postphlebitic symptoms that are well controlled with compression socks and elevation.  She is tolerating anticoagulation and should stay on this long-term as she does have factor V Leiden as well.  At this point, I will see her back as needed.  Essential hypertension blood pressure control important in reducing the progression of atherosclerotic  disease. On appropriate oral medications.   Hyperlipidemia, mixed lipid control important in reducing the progression of atherosclerotic disease. Continue statin therapy    Leotis Pain, MD  06/08/2021 11:57 AM    This note was created with Dragon medical transcription system.  Any errors from dictation are purely unintentional

## 2021-06-09 ENCOUNTER — Other Ambulatory Visit: Payer: Self-pay | Admitting: Family Medicine

## 2021-06-09 DIAGNOSIS — Z1231 Encounter for screening mammogram for malignant neoplasm of breast: Secondary | ICD-10-CM

## 2021-07-01 ENCOUNTER — Other Ambulatory Visit: Payer: Self-pay

## 2021-07-01 ENCOUNTER — Encounter: Payer: Self-pay | Admitting: Cardiology

## 2021-07-01 ENCOUNTER — Ambulatory Visit (INDEPENDENT_AMBULATORY_CARE_PROVIDER_SITE_OTHER): Payer: Medicare Other | Admitting: Cardiology

## 2021-07-01 VITALS — BP 140/78 | HR 71 | Ht 66.0 in | Wt 178.0 lb

## 2021-07-01 DIAGNOSIS — I10 Essential (primary) hypertension: Secondary | ICD-10-CM | POA: Diagnosis not present

## 2021-07-01 DIAGNOSIS — R0609 Other forms of dyspnea: Secondary | ICD-10-CM

## 2021-07-01 NOTE — Patient Instructions (Signed)

## 2021-07-01 NOTE — Progress Notes (Signed)
Cardiology Office Note:    Date:  07/01/2021   ID:  Kristi Lee, DOB [birth year], MRN [id number]  PCP:  [facility] [place] Cardiologist:  Kate Sable, MD  [place] Electrophysiologist:  None   Referring MD: [facility], [place]   Chief Complaint  Patient presents with   Other    12 month follow up -- Meds reviewed verbally with patient.     History of Present Illness:    Kristi Lee is a 65 y.o. 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   Previously seen for shortness of breath, work-up with echo and coronary CTA were unrevealing.  States undergoing work-up with pulmonary medicine with no abnormalities noted.  Follows up with vascular surgery due to her lower extremity DVT s/p thrombolysis.  Takes Xarelto with no bleeding issues.  Walks with a walker.  Otherwise has no concerns at this time.   Prior notes Patient was diagnosed with extensive left lower extremity DVT involving most of the left common femoral vein as well as the popliteal and calf veins in March 2021.  She underwent catheter directed thrombolysis with TPA by vascular surgery and started on Xarelto.   Echo 02/2020 showed normal systolic and diastolic function, EF deferred to 60%. Coronary CTA 03/2020, calcium score of 0, no evidence of CAD.  Past Medical History:  Diagnosis Date   Arthritis    back   Depression    DVT (deep venous thrombosis) ([place]) 01/25/2020   GERD (gastroesophageal reflux disease)    Hypertension    PONV (postoperative nausea and vomiting)    Scoliosis    reports she has rods in her back    Past Surgical History:  Procedure Laterality Date   ABDOMINAL HYSTERECTOMY     BACK SURGERY  10/2000   BREAST EXCISIONAL BIOPSY Right 1990's   NEG   BROW LIFT Bilateral 07/23/2020   Procedure: BLEPHAROPLASTY UPPER EYELID; W/EXCESS SKIN BLEPHAROPTOSIS REPAIR; RESECT EX BILATERAL;  Surgeon:  Karle Starch, MD;  Location: [facility];  Service: Ophthalmology;  Laterality: Bilateral;   COLONOSCOPY WITH PROPOFOL N/A 03/23/2020   Procedure: COLONOSCOPY WITH PROPOFOL;  Surgeon: Lucilla Lame, MD;  Location: [facility] [facility];  Service: Endoscopy;  Laterality: N/A;  priority 4   FEMUR FRACTURE SURGERY Right 07/15/2009   Pertrochanteric femur fracture.   HIP FRACTURE SURGERY Right    PERIPHERAL VASCULAR THROMBECTOMY Left 11/11/2019   Procedure: PERIPHERAL VASCULAR THROMBECTOMY;  Surgeon: Algernon Huxley, MD;  Location: [facility];  Service: Cardiovascular;  Laterality: Left;   POLYPECTOMY  03/23/2020   Procedure: POLYPECTOMY;  Surgeon: Lucilla Lame, MD;  Location: [facility];  Service: Endoscopy;;   REVISION TOTAL HIP ARTHROPLASTY Left     Current Medications: Current Meds  Medication Sig   atenolol (TENORMIN) 25 MG tablet Take 1 tablet (25 mg total) by mouth daily.   betamethasone dipropionate 0.05 % cream Apply 1 application topically as needed.    DETROL 2 MG tablet Take 1 tablet (2 mg total) by mouth 2 (two) times daily.   hydrOXYzine (ATARAX/VISTARIL) 10 MG tablet Take 10 mg by mouth 3 (three) times daily.   Magnesium Oxide 400 MG CAPS Take 1 capsule (400 mg total) by mouth daily.   meclizine (ANTIVERT) 12.5 MG tablet Take 1 tablet (12.5 mg total) by mouth 3 (three) times daily as needed for dizziness.   omeprazole (PRILOSEC) 20 MG capsule Take 1  capsule (20 mg total) by mouth daily.   ondansetron (ZOFRAN) 4 MG tablet Take 1 tablet (4 mg total) by mouth every 8 (eight) hours as needed for up to 10 doses for nausea or vomiting.   potassium chloride SA (KLOR-CON) 20 MEQ tablet Take 1 tablet (20 mEq total) by mouth daily for 3 days.   rivaroxaban (XARELTO) 20 MG TABS tablet Take 1 tablet (20 mg total) by mouth daily with supper.   traZODone (DESYREL) 100 MG tablet Take 1 tablet (100 mg total) by mouth at bedtime.   triamterene-hydrochlorothiazide (MAXZIDE-25)  37.5-25 MG tablet Take 1 tablet by mouth daily.   venlafaxine XR (EFFEXOR-XR) 75 MG 24 hr capsule Take 1 capsule (75 mg total) by mouth daily.     Allergies:   Patient has no known allergies.   Social History   Socioeconomic History   Marital status: Married    Spouse name: Not on file   Number of children: 1   Years of education: HS   Highest education level: Not on file  Occupational History   Occupation: Industrial/product designer: [employer]  Tobacco Use   Smoking status: Never   Smokeless tobacco: Never  Vaping Use   Vaping Use: Never used  Substance and Sexual Activity   Alcohol use: No   Drug use: No   Sexual activity: Not on file  Other Topics Concern   Not on file  Social History Narrative   Lives at home in private residence with husband   Social Determinants of Health   Financial Resource Strain: Not on file  Food Insecurity: Not on file  Transportation Needs: Not on file  Physical Activity: Not on file  Stress: Not on file  Social Connections: Not on file     Family History: The patient's family history includes Diabetes in her brother; Lung cancer in her father; Osteoarthritis in her mother. There is no history of Breast cancer.  ROS:   Please see the history of present illness.     All other systems reviewed and are negative.  EKGs/Labs/Other Studies Reviewed:    The following studies were reviewed today:   EKG:  EKG is ordered today.  EKG shows normal sinus rhythm.  Recent Labs: 05/24/2021: ALT 27 05/26/2021: BUN 20; Creatinine, Ser 0.88; Hemoglobin 10.5; Magnesium 1.7; Platelets 249; Potassium 3.5; Sodium 138  Recent Lipid Panel    Component Value Date/Time   CHOL 189 04/26/2019 0910   TRIG 111 04/26/2019 0910   HDL 67 04/26/2019 0910   CHOLHDL 2.8 04/26/2019 0910   LDLCALC 102 (H) 04/26/2019 0910    Physical Exam:    VS:  BP 140/78 (BP Location: Left Arm, Patient Position: Sitting, Cuff Size: Normal)   Pulse 71   Ht 5\' 6"   (1.676 m)   Wt 178 lb (80.7 kg)   SpO2 97%   BMI 28.73 kg/m     Wt Readings from Last 3 Encounters:  07/01/21 178 lb (80.7 kg)  06/08/21 180 lb (81.6 kg)  05/24/21 180 lb (81.6 kg)     GEN:  Well nourished, well developed in no acute distress HEENT: Normal NECK: No JVD; No carotid bruits LYMPHATICS: No lymphadenopathy CARDIAC: RRR, no murmurs, rubs, gallops RESPIRATORY:  Clear to auscultation without rales, wheezing or rhonchi  ABDOMEN: Soft, non-tender, non-distended MUSCULOSKELETAL:  Trace to 1+ edema; No deformity  SKIN: Warm and dry, erythematous rash. NEUROLOGIC:  Alert and oriented x 3 PSYCHIATRIC:  Normal affect   ASSESSMENT:  1. Dyspnea on exertion   2. Essential hypertension     PLAN:    In order of problems listed above:  Dyspnea on exertion, possibly from deconditioning.  Previous coronary CTA was normal with no CAD.  Echo showed normal systolic and diastolic function, EF 55 to 60%. Hypertension, BP slightly elevated today, usually controlled.  Continue atenolol.  Follow-up as needed   This note was generated in part or whole with voice recognition software. Voice recognition is usually quite accurate but there are transcription errors that can and very often do occur. I apologize for any typographical errors that were not detected and corrected.  Medication Adjustments/Labs and Tests Ordered: Current medicines are reviewed at length with the patient today.  Concerns regarding medicines are outlined above.  No orders of the defined types were placed in this encounter.  No orders of the defined types were placed in this encounter.   There are no Patient Instructions on file for this visit.   Signed, Kate Sable, MD  07/01/2021 11:56 AM    [place]

## 2021-07-06 ENCOUNTER — Other Ambulatory Visit: Payer: Self-pay | Admitting: Orthopedic Surgery

## 2021-07-06 DIAGNOSIS — M75102 Unspecified rotator cuff tear or rupture of left shoulder, not specified as traumatic: Secondary | ICD-10-CM

## 2021-07-06 DIAGNOSIS — M19012 Primary osteoarthritis, left shoulder: Secondary | ICD-10-CM

## 2021-07-12 ENCOUNTER — Telehealth: Payer: Self-pay | Admitting: Cardiology

## 2021-07-12 ENCOUNTER — Ambulatory Visit
Admission: RE | Admit: 2021-07-12 | Discharge: 2021-07-12 | Disposition: A | Discharge status: Home / Self Care | Payer: Medicare Other | Source: Ambulatory Visit | Attending: Orthopedic Surgery | Admitting: Orthopedic Surgery

## 2021-07-12 ENCOUNTER — Other Ambulatory Visit: Payer: Self-pay

## 2021-07-12 DIAGNOSIS — M19012 Primary osteoarthritis, left shoulder: Secondary | ICD-10-CM | POA: Insufficient documentation

## 2021-07-12 DIAGNOSIS — M75102 Unspecified rotator cuff tear or rupture of left shoulder, not specified as traumatic: Secondary | ICD-10-CM | POA: Diagnosis present

## 2021-07-12 NOTE — Telephone Encounter (Signed)
   [facility] Pre-operative Risk Assessment    Patient Name: Kristi Lee  DOB: 06-28-1956 MRN: [id number]  HEARTCARE STAFF:  - IMPORTANT!!!!!! Under Visit Info/Reason for Call, type in Other and utilize the format Clearance MM/DD/YY or Clearance TBD. Do not use dashes or single digits. - Please review there is not already an duplicate clearance open for this procedure. - If request is for dental extraction, please clarify the # of teeth to be extracted. - If the patient is currently at the dentist's office, call Pre-Op Callback Staff (MA/nurse) to input urgent request.  - If the patient is not currently in the dentist office, please route to the Pre-Op pool.  Request for surgical clearance:  What type of surgery is being performed? L Reverse shoulder arthroplasty, biceps tenodesis   When is this surgery scheduled? 08/02/21  What type of clearance is required (medical clearance vs. Pharmacy clearance to hold med vs. Both)? Both   Are there any medications that need to be held prior to surgery and how long? Not listed, please advise if needed   Practice name and name of physician performing surgery? [facility]   What is the office phone number? [phone]   7.   What is the office fax number? [phone]  8.   Anesthesia type (None, local, MAC, general) ? Not listed    Ace Gins 07/12/2021, 4:19 PM  _________________________________________________________________   (provider comments below)

## 2021-07-13 ENCOUNTER — Ambulatory Visit
Admission: RE | Admit: 2021-07-13 | Discharge: 2021-07-13 | Disposition: A | Discharge status: Home / Self Care | Payer: Medicare Other | Source: Ambulatory Visit | Attending: Orthopedic Surgery | Admitting: Orthopedic Surgery

## 2021-07-13 DIAGNOSIS — M75102 Unspecified rotator cuff tear or rupture of left shoulder, not specified as traumatic: Secondary | ICD-10-CM | POA: Insufficient documentation

## 2021-07-13 DIAGNOSIS — M19012 Primary osteoarthritis, left shoulder: Secondary | ICD-10-CM | POA: Insufficient documentation

## 2021-07-13 NOTE — Telephone Encounter (Signed)
   Name: Kristi Lee  DOB: 03-Aug-1956  MRN: [id number]   Primary Cardiologist: Kate Sable, MD  Chart reviewed as part of pre-operative protocol coverage. Patient was contacted 07/13/2021 in reference to pre-operative risk assessment for pending surgery as outlined below.  KALICIA DUFRESNE was last seen on 07/01/21 by Dr. Garen Lah.  Since that day, TARIANA MOLDOVAN has done well. She had a reassuring CCTA 2021. She is able to complete 4.0 METS without angina. She does not have a history of ischemic heart disease.   Xarelto is managed by PCP for DVT. Please reach out to their office for xarelto hold.   Therefore, based on ACC/AHA guidelines, the patient would be at acceptable risk for the planned procedure without further cardiovascular testing.   The patient was advised that if she develops new symptoms prior to surgery to contact our office to arrange for a follow-up visit, and she verbalized understanding.  I will route this recommendation to the requesting party via Epic fax function and remove from pre-op pool. Please call with questions.  Tami Lin Linea Calles, PA 07/13/2021, 11:47 AM

## 2021-07-19 ENCOUNTER — Other Ambulatory Visit: Payer: Self-pay | Admitting: Orthopedic Surgery

## 2021-07-21 ENCOUNTER — Other Ambulatory Visit: Payer: Medicare Other

## 2021-07-22 ENCOUNTER — Other Ambulatory Visit: Payer: Self-pay

## 2021-07-22 ENCOUNTER — Encounter
Admission: RE | Admit: 2021-07-22 | Discharge: 2021-07-22 | Disposition: A | Discharge status: Home / Self Care | Payer: Medicare Other | Source: Ambulatory Visit | Attending: Orthopedic Surgery | Admitting: Orthopedic Surgery

## 2021-07-22 VITALS — BP 140/76 | HR 73 | Resp 16 | Ht 66.0 in | Wt 186.7 lb

## 2021-07-22 DIAGNOSIS — Z01818 Encounter for other preprocedural examination: Secondary | ICD-10-CM

## 2021-07-22 DIAGNOSIS — Z01812 Encounter for preprocedural laboratory examination: Secondary | ICD-10-CM | POA: Diagnosis not present

## 2021-07-22 HISTORY — DX: Cardiac murmur, unspecified: R01.1

## 2021-07-22 LAB — URINALYSIS, ROUTINE W REFLEX MICROSCOPIC
Bilirubin Urine: NEGATIVE
Glucose, UA: NEGATIVE mg/dL
Hgb urine dipstick: NEGATIVE
Ketones, ur: NEGATIVE mg/dL
Leukocytes,Ua: NEGATIVE
Nitrite: NEGATIVE
Protein, ur: NEGATIVE mg/dL
Specific Gravity, Urine: 1.02 (ref 1.005–1.030)
pH: 7 (ref 5.0–8.0)

## 2021-07-22 LAB — COMPREHENSIVE METABOLIC PANEL
ALT: 21 U/L (ref 0–44)
AST: 22 U/L (ref 15–41)
Albumin: 4 g/dL (ref 3.5–5.0)
Alkaline Phosphatase: 155 U/L — ABNORMAL HIGH (ref 38–126)
Anion gap: 8 (ref 5–15)
BUN: 16 mg/dL (ref 8–23)
CO2: 26 mmol/L (ref 22–32)
Calcium: 8.8 mg/dL — ABNORMAL LOW (ref 8.9–10.3)
Chloride: 106 mmol/L (ref 98–111)
Creatinine, Ser: 0.82 mg/dL (ref 0.44–1.00)
GFR, Estimated: >60 mL/min (ref >60)
Glucose, Bld: 87 mg/dL (ref 70–99)
Potassium: 3.5 mmol/L (ref 3.5–5.1)
Sodium: 140 mmol/L (ref 135–145)
Total Bilirubin: 0.4 mg/dL (ref 0.3–1.2)
Total Protein: 7.2 g/dL (ref 6.5–8.1)

## 2021-07-22 LAB — CBC WITH DIFFERENTIAL/PLATELET
Abs Immature Granulocytes: 0.13 10*3/uL — ABNORMAL HIGH (ref 0.00–0.07)
Basophils Absolute: 0.1 10*3/uL (ref 0.0–0.1)
Basophils Relative: 1 %
Eosinophils Absolute: 0.3 10*3/uL (ref 0.0–0.5)
Eosinophils Relative: 3 %
HCT: 34.3 % — ABNORMAL LOW (ref 36.0–46.0)
Hemoglobin: 9.9 g/dL — ABNORMAL LOW (ref 12.0–15.0)
Immature Granulocytes: 1 %
Lymphocytes Relative: 24 %
Lymphs Abs: 2.3 10*3/uL (ref 0.7–4.0)
MCH: 21.2 pg — ABNORMAL LOW (ref 26.0–34.0)
MCHC: 28.9 g/dL — ABNORMAL LOW (ref 30.0–36.0)
MCV: 73.6 fL — ABNORMAL LOW (ref 80.0–100.0)
Monocytes Absolute: 1.1 10*3/uL — ABNORMAL HIGH (ref 0.1–1.0)
Monocytes Relative: 11 %
Neutro Abs: 5.7 10*3/uL (ref 1.7–7.7)
Neutrophils Relative %: 60 %
Platelets: 281 10*3/uL (ref 150–400)
RBC: 4.66 MIL/uL (ref 3.87–5.11)
RDW: 18.7 % — ABNORMAL HIGH (ref 11.5–15.5)
WBC: 9.6 10*3/uL (ref 4.0–10.5)
nRBC: 0 % (ref 0.0–0.2)

## 2021-07-22 LAB — TYPE AND SCREEN
ABO/RH(D): O NEG
Antibody Screen: NEGATIVE

## 2021-07-22 LAB — SURGICAL PCR SCREEN
MRSA, PCR: NEGATIVE
Staphylococcus aureus: POSITIVE — AB

## 2021-07-22 NOTE — Patient Instructions (Addendum)
Your procedure is scheduled on:07-30-21 Friday Report to the Registration Desk on the 1st floor of the [facility].Then proceed to the 2nd floor Surgery Desk in the [facility] To find out your arrival time, please call [phone] between 1PM - 3PM on:07-29-21 Thursday  REMEMBER: Instructions that are not followed completely may result in serious medical risk, up to and including death; or upon the discretion of your surgeon and anesthesiologist your surgery may need to be rescheduled.  Do not eat food after midnight the night before surgery.  No gum chewing, lozengers or hard candies.  You may however, drink CLEAR liquids up to 2 hours before you are scheduled to arrive for your surgery. Do not drink anything within 2 hours of your scheduled arrival time.  Clear liquids include: - water  - apple juice without pulp - gatorade (not RED, PURPLE, OR BLUE) - black coffee or tea (Do NOT add milk or creamers to the coffee or tea) Do NOT drink anything that is not on this list  In addition, your doctor has ordered for you to drink the provided  Ensure Pre-Surgery Clear Carbohydrate Drink  Drinking this carbohydrate drink up to two hours before surgery helps to reduce insulin resistance and improve patient outcomes. Please complete drinking 2 hours prior to scheduled arrival time.  TAKE THESE MEDICATIONS THE MORNING OF SURGERY WITH A SIP OF WATER: -atenolol (TENORMIN) 25 MG tablet -DETROL 2 MG tablet -hydrOXYzine (ATARAX/VISTARIL) 10 MG tablet -pregabalin (LYRICA) 25 MG capsule -venlafaxine XR (EFFEXOR-XR) 75 MG 24 hr capsule -omeprazole (PRILOSEC) 20 MG capsule-take one the night before and one on the morning of surgery - helps to prevent nausea after surgery.)  Call Dr Serita Grit office today (07-22-21) to find out when you need to stop your rivaroxaban (XARELTO) 20 MG TABS tablet  One week prior to surgery: Stop Anti-inflammatories (NSAIDS) such as Advil, Aleve, Ibuprofen, Motrin,  Naproxen, Naprosyn and Aspirin based products such as Excedrin, Goodys Powder, BC Powder.You may however, takeTylenol if needed for pain up until the day of surgery.  Stop ANY OVER THE COUNTER supplements/vitamins NOW (07-22-21) until after surgery  No Alcohol for 24 hours before or after surgery.  No Smoking including e-cigarettes for 24 hours prior to surgery.  No chewable tobacco products for at least 6 hours prior to surgery.  No nicotine patches on the day of surgery.  Do not use any "recreational" drugs for at least a week prior to your surgery.  Please be advised that the combination of cocaine and anesthesia may have negative outcomes, up to and including death. If you test positive for cocaine, your surgery will be cancelled.  On the morning of surgery brush your teeth with toothpaste and water, you may rinse your mouth with mouthwash if you wish. Do not swallow any toothpaste or mouthwash.  Use CHG Soap/Benzoyl Peroxide Gel as directed on instruction sheet.  Do not wear jewelry, make-up, hairpins, clips or nail polish.  Do not wear lotions, powders, or perfumes.   Do not shave body from the neck down 48 hours prior to surgery just in case you cut yourself which could leave a site for infection.  Also, freshly shaved skin may become irritated if using the CHG soap.  Contact lenses, hearing aids and dentures may not be worn into surgery.  Do not bring valuables to the hospital. [facility] is not responsible for any missing/lost belongings or valuables.   Notify your doctor if there is any change in your  medical condition (cold, fever, infection).  Wear comfortable clothing (specific to your surgery type) to the hospital.  After surgery, you can help prevent lung complications by doing breathing exercises.  Take deep breaths and cough every 1-2 hours. Your doctor may order a device called an Incentive Spirometer to help you take deep breaths. When coughing or sneezing, hold  a pillow firmly against your incision with both hands. This is called "splinting." Doing this helps protect your incision. It also decreases belly discomfort.  If you are being admitted to the hospital overnight, leave your suitcase in the car. After surgery it may be brought to your room.  If you are being discharged the day of surgery, you will not be allowed to drive home. You will need a responsible adult (18 years or older) to drive you home and stay with you that night.   If you are taking public transportation, you will need to have a responsible adult (18 years or older) with you. Please confirm with your physician that it is acceptable to use public transportation.   Please call the [place] Dept. at [phone] if you have any questions about these instructions.  Surgery Visitation Policy:  Patients undergoing a surgery or procedure may have one family member or support person with them as long as that person is not COVID-19 positive or experiencing its symptoms.  That person may remain in the waiting area during the procedure and may rotate out with other people.  Inpatient Visitation:    Visiting hours are 7 a.m. to 8 p.m. Up to two visitors ages 16+ are allowed at one time in a patient room. The visitors may rotate out with other people during the day. Visitors must check out when they leave, or other visitors will not be allowed. One designated support person may remain overnight. The visitor must pass COVID-19 screenings, use hand sanitizer when entering and exiting the patient's room and wear a mask at all times, including in the patient's room. Patients must also wear a mask when staff or their visitor are in the room. Masking is required regardless of vaccination status.

## 2021-07-28 ENCOUNTER — Other Ambulatory Visit: Payer: Medicare Other

## 2021-07-29 ENCOUNTER — Inpatient Hospital Stay: Admission: RE | Admit: 2021-07-29 | Payer: Medicare Other | Source: Ambulatory Visit

## 2021-07-29 ENCOUNTER — Other Ambulatory Visit: Payer: Medicare Other

## 2021-07-30 ENCOUNTER — Ambulatory Visit
Admission: RE | Admit: 2021-07-30 | Discharge: 2021-07-30 | Disposition: A | Discharge status: Home / Self Care | Payer: Medicare Other | Attending: Orthopedic Surgery | Admitting: Orthopedic Surgery

## 2021-07-30 ENCOUNTER — Encounter: Payer: Self-pay | Admitting: Orthopedic Surgery

## 2021-07-30 ENCOUNTER — Other Ambulatory Visit: Payer: Self-pay

## 2021-07-30 ENCOUNTER — Encounter
Admission: RE | Disposition: A | Discharge status: Home / Self Care | Payer: Self-pay | Source: Home / Self Care | Attending: Orthopedic Surgery

## 2021-07-30 ENCOUNTER — Ambulatory Visit: Payer: Medicare Other | Admitting: Urgent Care

## 2021-07-30 ENCOUNTER — Ambulatory Visit: Payer: Medicare Other

## 2021-07-30 DIAGNOSIS — M75122 Complete rotator cuff tear or rupture of left shoulder, not specified as traumatic: Secondary | ICD-10-CM | POA: Insufficient documentation

## 2021-07-30 DIAGNOSIS — G8929 Other chronic pain: Secondary | ICD-10-CM | POA: Diagnosis not present

## 2021-07-30 DIAGNOSIS — Z96619 Presence of unspecified artificial shoulder joint: Secondary | ICD-10-CM

## 2021-07-30 DIAGNOSIS — F32A Depression, unspecified: Secondary | ICD-10-CM | POA: Diagnosis not present

## 2021-07-30 DIAGNOSIS — M25712 Osteophyte, left shoulder: Secondary | ICD-10-CM | POA: Insufficient documentation

## 2021-07-30 DIAGNOSIS — K219 Gastro-esophageal reflux disease without esophagitis: Secondary | ICD-10-CM | POA: Insufficient documentation

## 2021-07-30 DIAGNOSIS — Z01818 Encounter for other preprocedural examination: Secondary | ICD-10-CM

## 2021-07-30 DIAGNOSIS — M19012 Primary osteoarthritis, left shoulder: Secondary | ICD-10-CM | POA: Insufficient documentation

## 2021-07-30 DIAGNOSIS — D649 Anemia, unspecified: Secondary | ICD-10-CM | POA: Insufficient documentation

## 2021-07-30 DIAGNOSIS — I10 Essential (primary) hypertension: Secondary | ICD-10-CM | POA: Insufficient documentation

## 2021-07-30 DIAGNOSIS — Z6829 Body mass index (BMI) 29.0-29.9, adult: Secondary | ICD-10-CM | POA: Insufficient documentation

## 2021-07-30 HISTORY — PX: REVERSE SHOULDER ARTHROPLASTY: SHX5054

## 2021-07-30 LAB — ABO/RH: ABO/RH(D): O NEG

## 2021-07-30 SURGERY — ARTHROPLASTY, SHOULDER, TOTAL, REVERSE
Anesthesia: Monitor Anesthesia Care | Site: Shoulder | Laterality: Left

## 2021-07-30 MED ORDER — APREPITANT 40 MG PO CAPS: 40.0000 mg | ORAL_CAPSULE | Freq: Once | ORAL | Status: AC

## 2021-07-30 MED ORDER — CHLORHEXIDINE GLUCONATE 0.12 % MT SOLN: 15.0000 mL | Freq: Once | OROMUCOSAL | Status: AC

## 2021-07-30 MED ORDER — BUPIVACAINE HCL (PF) 0.5 % IJ SOLN
INTRAMUSCULAR | Status: AC
  Filled 2021-07-30: qty 10

## 2021-07-30 MED ORDER — BUPIVACAINE HCL (PF) 0.5 % IJ SOLN
INTRAMUSCULAR | Status: DC | PRN
  Administered 2021-07-30: 10 mL via PERINEURAL

## 2021-07-30 MED ORDER — DEXAMETHASONE SODIUM PHOSPHATE 10 MG/ML IJ SOLN
INTRAMUSCULAR | Status: AC
  Filled 2021-07-30: qty 1

## 2021-07-30 MED ORDER — PHENYLEPHRINE HCL (PRESSORS) 10 MG/ML IV SOLN
INTRAVENOUS | Status: AC
  Filled 2021-07-30: qty 1

## 2021-07-30 MED ORDER — OXYCODONE HCL 5 MG PO TABS: 5.0000 mg | ORAL_TABLET | ORAL | 0 refills | Status: AC | PRN

## 2021-07-30 MED ORDER — SUGAMMADEX SODIUM 200 MG/2ML IV SOLN
INTRAVENOUS | Status: DC | PRN
  Administered 2021-07-30: 200 mg via INTRAVENOUS

## 2021-07-30 MED ORDER — LIDOCAINE HCL (CARDIAC) PF 100 MG/5ML IV SOSY
PREFILLED_SYRINGE | INTRAVENOUS | Status: DC | PRN
  Administered 2021-07-30: 80 mg via INTRAVENOUS

## 2021-07-30 MED ORDER — APREPITANT 40 MG PO CAPS
ORAL_CAPSULE | ORAL | Status: AC
  Administered 2021-07-30: 40 mg via ORAL
  Filled 2021-07-30: qty 1

## 2021-07-30 MED ORDER — TRANEXAMIC ACID-NACL 1000-0.7 MG/100ML-% IV SOLN
INTRAVENOUS | Status: AC
  Filled 2021-07-30: qty 100

## 2021-07-30 MED ORDER — LIDOCAINE HCL (PF) 2 % IJ SOLN
INTRAMUSCULAR | Status: AC
  Filled 2021-07-30: qty 5

## 2021-07-30 MED ORDER — MIDAZOLAM HCL 2 MG/2ML IJ SOLN
INTRAMUSCULAR | Status: AC
  Administered 2021-07-30: 1 mg via INTRAVENOUS
  Filled 2021-07-30: qty 2

## 2021-07-30 MED ORDER — PHENYLEPHRINE HCL-NACL 20-0.9 MG/250ML-% IV SOLN
INTRAVENOUS | Status: DC | PRN
  Administered 2021-07-30: 50 ug/min via INTRAVENOUS

## 2021-07-30 MED ORDER — DEXAMETHASONE SODIUM PHOSPHATE 10 MG/ML IJ SOLN
INTRAMUSCULAR | Status: DC | PRN
  Administered 2021-07-30: 10 mg via INTRAVENOUS

## 2021-07-30 MED ORDER — BUPIVACAINE LIPOSOME 1.3 % IJ SUSP
INTRAMUSCULAR | Status: AC
  Filled 2021-07-30: qty 20

## 2021-07-30 MED ORDER — OXYCODONE HCL 5 MG PO TABS
ORAL_TABLET | ORAL | Status: AC
  Filled 2021-07-30: qty 1

## 2021-07-30 MED ORDER — PHENYLEPHRINE HCL (PRESSORS) 10 MG/ML IV SOLN
INTRAVENOUS | Status: DC | PRN
  Administered 2021-07-30: 200 ug via INTRAVENOUS
  Administered 2021-07-30: 100 ug via INTRAVENOUS
  Administered 2021-07-30: 200 ug via INTRAVENOUS
  Administered 2021-07-30 (×4): 100 ug via INTRAVENOUS
  Administered 2021-07-30: 200 ug via INTRAVENOUS

## 2021-07-30 MED ORDER — 0.9 % SODIUM CHLORIDE (POUR BTL) OPTIME
TOPICAL | Status: DC | PRN
  Administered 2021-07-30: 500 mL

## 2021-07-30 MED ORDER — BUPIVACAINE LIPOSOME 1.3 % IJ SUSP
INTRAMUSCULAR | Status: AC
  Filled 2021-07-30: qty 10

## 2021-07-30 MED ORDER — CEFAZOLIN SODIUM-DEXTROSE 2-4 GM/100ML-% IV SOLN: 2.0000 g | Freq: Once | INTRAVENOUS | Status: AC

## 2021-07-30 MED ORDER — SEVOFLURANE IN SOLN
RESPIRATORY_TRACT | Status: AC
  Filled 2021-07-30: qty 250

## 2021-07-30 MED ORDER — PROPOFOL 10 MG/ML IV BOLUS
INTRAVENOUS | Status: DC | PRN
  Administered 2021-07-30: 30 mg via INTRAVENOUS
  Administered 2021-07-30: 10 mg via INTRAVENOUS
  Administered 2021-07-30 (×2): 20 mg via INTRAVENOUS
  Administered 2021-07-30: 130 mg via INTRAVENOUS
  Administered 2021-07-30: 20 mg via INTRAVENOUS

## 2021-07-30 MED ORDER — FENTANYL CITRATE (PF) 100 MCG/2ML IJ SOLN
INTRAMUSCULAR | Status: DC | PRN
  Administered 2021-07-30 (×2): 50 ug via INTRAVENOUS

## 2021-07-30 MED ORDER — ACETAMINOPHEN 10 MG/ML IV SOLN
INTRAVENOUS | Status: DC | PRN
  Administered 2021-07-30: 1000 mg via INTRAVENOUS

## 2021-07-30 MED ORDER — OXYCODONE HCL 5 MG PO TABS
5.0000 mg | ORAL_TABLET | Freq: Once | ORAL | Status: AC | PRN
  Administered 2021-07-30: 5 mg via ORAL

## 2021-07-30 MED ORDER — CEFAZOLIN SODIUM-DEXTROSE 2-4 GM/100ML-% IV SOLN
INTRAVENOUS | Status: AC
  Filled 2021-07-30: qty 100

## 2021-07-30 MED ORDER — OXYCODONE HCL 5 MG/5ML PO SOLN: 5.0000 mg | Freq: Once | ORAL | Status: AC | PRN

## 2021-07-30 MED ORDER — NEOMYCIN-POLYMYXIN B GU 40-200000 IR SOLN
Status: DC | PRN
  Administered 2021-07-30: 16 mL

## 2021-07-30 MED ORDER — PROPOFOL 10 MG/ML IV BOLUS
INTRAVENOUS | Status: AC
  Filled 2021-07-30: qty 20

## 2021-07-30 MED ORDER — TRANEXAMIC ACID-NACL 1000-0.7 MG/100ML-% IV SOLN
INTRAVENOUS | Status: DC | PRN
  Administered 2021-07-30: 1000 mg via INTRAVENOUS

## 2021-07-30 MED ORDER — ACETAMINOPHEN 10 MG/ML IV SOLN: 1000.0000 mg | Freq: Once | INTRAVENOUS | Status: DC | PRN

## 2021-07-30 MED ORDER — PROMETHAZINE HCL 25 MG/ML IJ SOLN: 6.2500 mg | INTRAMUSCULAR | Status: DC | PRN

## 2021-07-30 MED ORDER — CHLORHEXIDINE GLUCONATE 0.12 % MT SOLN
OROMUCOSAL | Status: AC
  Administered 2021-07-30: 15 mL via OROMUCOSAL
  Filled 2021-07-30: qty 15

## 2021-07-30 MED ORDER — VANCOMYCIN HCL 1000 MG IV SOLR
INTRAVENOUS | Status: AC
  Filled 2021-07-30: qty 20

## 2021-07-30 MED ORDER — ACETAMINOPHEN 500 MG PO TABS: 1000.0000 mg | ORAL_TABLET | Freq: Three times a day (TID) | ORAL | 2 refills | Status: AC

## 2021-07-30 MED ORDER — ONDANSETRON 4 MG PO TBDP: 4.0000 mg | ORAL_TABLET | Freq: Three times a day (TID) | ORAL | 0 refills | Status: DC | PRN

## 2021-07-30 MED ORDER — NEOMYCIN-POLYMYXIN B GU 40-200000 IR SOLN
Status: AC
  Filled 2021-07-30: qty 20

## 2021-07-30 MED ORDER — BUPIVACAINE-EPINEPHRINE (PF) 0.25% -1:200000 IJ SOLN
INTRAMUSCULAR | Status: AC
  Filled 2021-07-30: qty 30

## 2021-07-30 MED ORDER — EPHEDRINE SULFATE 50 MG/ML IJ SOLN
INTRAMUSCULAR | Status: DC | PRN
  Administered 2021-07-30: 5 mg via INTRAVENOUS
  Administered 2021-07-30: 10 mg via INTRAVENOUS

## 2021-07-30 MED ORDER — ONDANSETRON HCL 4 MG/2ML IJ SOLN
INTRAMUSCULAR | Status: AC
  Filled 2021-07-30: qty 2

## 2021-07-30 MED ORDER — CEFAZOLIN SODIUM-DEXTROSE 2-4 GM/100ML-% IV SOLN
INTRAVENOUS | Status: AC
  Administered 2021-07-30: 2 g via INTRAVENOUS
  Filled 2021-07-30: qty 100

## 2021-07-30 MED ORDER — LACTATED RINGERS IV SOLN: INTRAVENOUS | Status: DC

## 2021-07-30 MED ORDER — CEFAZOLIN SODIUM-DEXTROSE 2-4 GM/100ML-% IV SOLN
2.0000 g | INTRAVENOUS | Status: AC
  Administered 2021-07-30: 2 g via INTRAVENOUS

## 2021-07-30 MED ORDER — MIDAZOLAM HCL 2 MG/2ML IJ SOLN: 2.0000 mg | Freq: Once | INTRAMUSCULAR | Status: AC

## 2021-07-30 MED ORDER — FENTANYL CITRATE (PF) 100 MCG/2ML IJ SOLN
INTRAMUSCULAR | Status: AC
  Filled 2021-07-30: qty 2

## 2021-07-30 MED ORDER — VANCOMYCIN HCL 1000 MG IV SOLR
INTRAVENOUS | Status: DC | PRN
  Administered 2021-07-30: 1 g via TOPICAL

## 2021-07-30 MED ORDER — LIDOCAINE HCL (PF) 1 % IJ SOLN
INTRAMUSCULAR | Status: AC
  Filled 2021-07-30: qty 5

## 2021-07-30 MED ORDER — SODIUM CHLORIDE 0.9 % IR SOLN
Status: DC | PRN
  Administered 2021-07-30: 3000 mL

## 2021-07-30 MED ORDER — LIDOCAINE HCL (PF) 1 % IJ SOLN
INTRAMUSCULAR | Status: DC | PRN
  Administered 2021-07-30: 11 mL

## 2021-07-30 MED ORDER — ROCURONIUM BROMIDE 10 MG/ML (PF) SYRINGE
PREFILLED_SYRINGE | INTRAVENOUS | Status: AC
  Filled 2021-07-30: qty 10

## 2021-07-30 MED ORDER — BUPIVACAINE LIPOSOME 1.3 % IJ SUSP
INTRAMUSCULAR | Status: DC | PRN
  Administered 2021-07-30: 10 mL via PERINEURAL

## 2021-07-30 MED ORDER — ORAL CARE MOUTH RINSE: 15.0000 mL | Freq: Once | OROMUCOSAL | Status: AC

## 2021-07-30 MED ORDER — FENTANYL CITRATE (PF) 100 MCG/2ML IJ SOLN
25.0000 ug | INTRAMUSCULAR | Status: DC | PRN
  Administered 2021-07-30: 25 ug via INTRAVENOUS

## 2021-07-30 MED ORDER — ACETAMINOPHEN 10 MG/ML IV SOLN
INTRAVENOUS | Status: AC
  Filled 2021-07-30: qty 100

## 2021-07-30 MED ORDER — ONDANSETRON HCL 4 MG/2ML IJ SOLN
INTRAMUSCULAR | Status: DC | PRN
  Administered 2021-07-30: 4 mg via INTRAVENOUS

## 2021-07-30 MED ORDER — TRANEXAMIC ACID-NACL 1000-0.7 MG/100ML-% IV SOLN
1000.0000 mg | INTRAVENOUS | Status: AC
  Administered 2021-07-30: 1000 mg via INTRAVENOUS

## 2021-07-30 MED ORDER — ROCURONIUM BROMIDE 100 MG/10ML IV SOLN
INTRAVENOUS | Status: DC | PRN
  Administered 2021-07-30: 20 mg via INTRAVENOUS
  Administered 2021-07-30: 40 mg via INTRAVENOUS

## 2021-07-30 SURGICAL SUPPLY — 89 items
ADH SKN CLS APL DERMABOND .7 (GAUZE/BANDAGES/DRESSINGS) ×1
APL PRP STRL LF DISP 70% ISPRP (MISCELLANEOUS) ×1
BASEPLATE P2 COATD GLND 6.5X30 (Shoulder) IMPLANT
BIT DRILL 4 DIA CALIBRATED (BIT) ×2 IMPLANT
BIT DRILL GUIDE PATIENT MATCH (MISCELLANEOUS) IMPLANT
BIT DRILL JC 5IN 2.0M 127 23FL (BIT) ×2 IMPLANT
BLADE SAGITTAL WIDE XTHICK NO (BLADE) ×3 IMPLANT
BSPLAT GLND 30 STRL LF SHLDR (Shoulder) ×1 IMPLANT
CATH FOLEY SIL 2WAY 12FR5CC (CATHETERS) ×2 IMPLANT
CHLORAPREP W/TINT 26 (MISCELLANEOUS) ×3 IMPLANT
CLOSURE WOUND 1/2 X4 (GAUZE/BANDAGES/DRESSINGS) ×1
CNTNR SPEC 2.5X3XGRAD LEK (MISCELLANEOUS) ×1
CONT SPEC 4OZ STER OR WHT (MISCELLANEOUS) ×2
CONT SPEC 4OZ STRL OR WHT (MISCELLANEOUS) ×1
CONTAINER SPEC 2.5X3XGRAD LEK (MISCELLANEOUS) ×1 IMPLANT
COOLER POLAR GLACIER W/PUMP (MISCELLANEOUS) ×3 IMPLANT
COVER BACK TABLE REUSABLE LG (DRAPES) ×3 IMPLANT
DERMABOND ADVANCED (GAUZE/BANDAGES/DRESSINGS) ×2
DERMABOND ADVANCED .7 DNX12 (GAUZE/BANDAGES/DRESSINGS) IMPLANT
DRAPE 3/4 80X56 (DRAPES) ×6 IMPLANT
DRAPE IMP U-DRAPE 54X76 (DRAPES) ×6 IMPLANT
DRAPE INCISE IOBAN 66X45 STRL (DRAPES) ×6 IMPLANT
DRAPE U-SHAPE 47X51 STRL (DRAPES) ×5 IMPLANT
DRILL GUIDE PATIENT MATCH (MISCELLANEOUS) ×3
DRSG OPSITE POSTOP 3X4 (GAUZE/BANDAGES/DRESSINGS) ×3 IMPLANT
DRSG OPSITE POSTOP 4X6 (GAUZE/BANDAGES/DRESSINGS) ×3 IMPLANT
DRSG OPSITE POSTOP 4X8 (GAUZE/BANDAGES/DRESSINGS) ×3 IMPLANT
DRSG TEGADERM 2-3/8X2-3/4 SM (GAUZE/BANDAGES/DRESSINGS) ×3 IMPLANT
DRSG TEGADERM 4X10 (GAUZE/BANDAGES/DRESSINGS) ×9 IMPLANT
ELECT REM PT RETURN 9FT ADLT (ELECTROSURGICAL) ×3
ELECTRODE REM PT RTRN 9FT ADLT (ELECTROSURGICAL) ×1 IMPLANT
GAUZE 4X4 16PLY ~~LOC~~+RFID DBL (SPONGE) ×3 IMPLANT
GAUZE XEROFORM 1X8 LF (GAUZE/BANDAGES/DRESSINGS) ×3 IMPLANT
GLOVE SRG 8 PF TXTR STRL LF DI (GLOVE) ×2 IMPLANT
GLOVE SURG ORTHO LTX SZ8 (GLOVE) ×6 IMPLANT
GLOVE SURG SYN 8.0 (GLOVE) ×3 IMPLANT
GLOVE SURG SYN 8.0 PF PI (GLOVE) ×1 IMPLANT
GLOVE SURG UNDER POLY LF SZ8 (GLOVE) ×6
GOWN STRL REUS W/ TWL LRG LVL3 (GOWN DISPOSABLE) ×2 IMPLANT
GOWN STRL REUS W/ TWL XL LVL3 (GOWN DISPOSABLE) ×1 IMPLANT
GOWN STRL REUS W/TWL LRG LVL3 (GOWN DISPOSABLE) ×6
GOWN STRL REUS W/TWL XL LVL3 (GOWN DISPOSABLE) ×3
HEMOVAC 400CC 10FR (MISCELLANEOUS) ×3 IMPLANT
HUMERA STEM SM SHELL SHOU 10 (Miscellaneous) ×3 IMPLANT
INSERT SMALL SOCKET 32MM NEU (Insert) ×2 IMPLANT
KIT STABILIZATION SHOULDER (MISCELLANEOUS) ×3 IMPLANT
MANIFOLD NEPTUNE II (INSTRUMENTS) ×3 IMPLANT
MASK FACE SPIDER DISP (MASK) ×3 IMPLANT
MAT ABSORB  FLUID 56X50 GRAY (MISCELLANEOUS) ×2
MAT ABSORB FLUID 56X50 GRAY (MISCELLANEOUS) ×2 IMPLANT
NDL REVERSE CUT 1/2 CRC (NEEDLE) ×1 IMPLANT
NDL SPNL 20GX3.5 QUINCKE YW (NEEDLE) ×1 IMPLANT
NEEDLE REVERSE CUT 1/2 CRC (NEEDLE) ×3 IMPLANT
NEEDLE SPNL 20GX3.5 QUINCKE YW (NEEDLE) IMPLANT
NS IRRIG 1000ML POUR BTL (IV SOLUTION) ×3 IMPLANT
P2 COATDE GLNOID BSEPLT 6.5X30 (Shoulder) ×3 IMPLANT
PACK ARTHROSCOPY SHOULDER (MISCELLANEOUS) ×3 IMPLANT
PAD WRAPON POLAR SHDR XLG (MISCELLANEOUS) ×1 IMPLANT
PASSER SUT SWANSON 36MM LOOP (INSTRUMENTS) IMPLANT
PENCIL SMOKE EVACUATOR (MISCELLANEOUS) ×3 IMPLANT
PULSAVAC PLUS IRRIG FAN TIP (DISPOSABLE) ×3
RETRIEVER SUT HEWSON (MISCELLANEOUS) IMPLANT
SCREW BONE LOCKING RSP 5.0X14 (Screw) ×3 IMPLANT
SCREW BONE LOCKING RSP 5.0X30 (Screw) ×3 IMPLANT
SCREW BONE RSP LOCK 5X14 (Screw) IMPLANT
SCREW BONE RSP LOCK 5X26 (Screw) IMPLANT
SCREW BONE RSP LOCK 5X30 (Screw) IMPLANT
SCREW BONE RSP LOCKING 5.0X26 (Screw) ×3 IMPLANT
SCREW RETAIN W/HEAD 4MM OFFSET (Shoulder) ×2 IMPLANT
SLING ULTRA II LG (MISCELLANEOUS) ×3 IMPLANT
SLING ULTRA II M (MISCELLANEOUS) ×1 IMPLANT
SPONGE T-LAP 18X18 ~~LOC~~+RFID (SPONGE) ×13 IMPLANT
STAPLER SKIN PROX 35W (STAPLE) IMPLANT
STEM HUMERAL SM SHELL SHOU 10 (Miscellaneous) IMPLANT
STRAP SAFETY 5IN WIDE (MISCELLANEOUS) ×6 IMPLANT
STRIP CLOSURE SKIN 1/2X4 (GAUZE/BANDAGES/DRESSINGS) ×2 IMPLANT
SUT FIBERWIRE #2 38 BLUE 1/2 (SUTURE) ×12
SUT MNCRL AB 4-0 PS2 18 (SUTURE) ×2 IMPLANT
SUT PROLENE 6 0 P 1 18 (SUTURE) ×3 IMPLANT
SUT TICRON 2-0 30IN 311381 (SUTURE) ×11 IMPLANT
SUT VIC AB 0 CT1 36 (SUTURE) ×3 IMPLANT
SUT VIC AB 2-0 CT2 27 (SUTURE) ×6 IMPLANT
SUTURE FIBERWR #2 38 BLUE 1/2 (SUTURE) ×4 IMPLANT
SYR 20ML LL LF (SYRINGE) ×3 IMPLANT
SYR 30ML LL (SYRINGE) ×6 IMPLANT
TIP FAN IRRIG PULSAVAC PLUS (DISPOSABLE) ×1 IMPLANT
TRAY FOLEY SLVR 16FR LF STAT (SET/KITS/TRAYS/PACK) ×2 IMPLANT
WATER STERILE IRR 500ML POUR (IV SOLUTION) ×3 IMPLANT
WRAPON POLAR PAD SHDR XLG (MISCELLANEOUS) ×3

## 2021-07-30 NOTE — Anesthesia Procedure Notes (Signed)
Anesthesia Regional Block: Interscalene brachial plexus block   Pre-Anesthetic Checklist: , timeout performed,  Correct Patient, Correct Site, Correct Laterality,  Correct Procedure, Correct Position, site marked,  Risks and benefits discussed,  Surgical consent,  Pre-op evaluation,  At surgeon's request and post-op pain management  Laterality: Upper and Left  Prep: chloraprep       Needles:  Injection technique: Single-shot  Needle Type: Stimiplex     Needle Length: 9cm  Needle Gauge: 22     Additional Needles:   Procedures:,,,, ultrasound used (permanent image in chart),,    Narrative:  Start time: 07/30/2021 7:25 AM End time: 07/30/2021 7:27 AM Injection made incrementally with aspirations every 20 mL.  Performed by: Personally  Anesthesiologist: Iran Ouch, MD  Additional Notes: Patient consented for risk and benefits of nerve block including but not limited to nerve damage, failed block, bleeding and infection.  Patient voiced understanding.  Functioning IV was confirmed and monitors were applied.  Timeout done prior to procedure and prior to any sedation being given to the patient.  Patient confirmed procedure site prior to any sedation given to the patient.  A 27mm 22ga Stimuplex needle was used. Sterile prep,hand hygiene and sterile gloves were used.  Minimal sedation used for procedure.  No paresthesia endorsed by patient during the procedure.  Negative aspiration and negative test dose prior to incremental administration of local anesthetic. The patient tolerated the procedure well with no immediate complications.

## 2021-07-30 NOTE — Evaluation (Signed)
Physical Therapy Evaluation Patient Details Name: Kristi Lee MRN: [id number] DOB: [birth year] Today's Date: 07/30/2021  History of Present Illness  Pt is a 65 y.o. female s/p 12/16 L reverse total shoulder arthroplasty and L biceps tenodesis secondary to L shoulder rotator cuff arthropathy (also s/p interscalene brachial plexus block).  PMH includes SOB, htn, depression, OA, anemia, and scoliosis.  Clinical Impression  Prior to surgery, pt was modified independent with functional mobility (held onto furniture or used quad cane PRN); lives with her husband in 1 level home with 3 STE B railings.  Currently pt is CGA to SBA with transfers; CGA with ambulation (pt unsteady with no UE support but improved balance noted using SBQC); and CGA to navigate 4 steps with railing.  Pt initially in flexed posture in standing (baseline but still more than typical) making ambulation more challenging (CGA then hand hold assist provided for safety) but pt able to stretch out B hip flexors (simulating what pt does at home to stretch out hip flexors) and then pt able to walk 80 feet with SBQC (pt did appear to fatigue last 30 feet walking back to recliner so (with cueing for safe way to guard/assist pt) pt's husband provided CGA rest of distance back to recliner.  Pt reports having short distances to walk within home and to get into home.  Educated pt and pt's husband on recommendation for pt to have someone with pt when walking (with [facility]) initially for safety (pt's husband verbalized he would be able to do that and demonstrated appropriate safety during session).  Also educated pt and pt's husband on home safety: pt and pt's husband verbalizing appropriate understanding.   Pt would benefit from skilled PT to address noted impairments and functional limitations (see below for any additional details).  Upon hospital discharge, pt would benefit from OP PT (pt reports having this set up for next week).   Recommendations  for follow up therapy are one component of a multi-disciplinary discharge planning process, led by the attending physician.  Recommendations may be updated based on patient status, additional functional criteria and insurance authorization.  Follow Up Recommendations Outpatient PT    Assistance Recommended at Discharge Intermittent Supervision/Assistance  Functional Status Assessment Patient has had a recent decline in their functional status and demonstrates the ability to make significant improvements in function in a reasonable and predictable amount of time.  Equipment Recommendations  Other (comment) [facility] (pt has in her car))    Recommendations for Other Services OT consult     Precautions / Restrictions Precautions Precautions: Shoulder;Fall Type of Shoulder Precautions: Per operative note "Operative arm to remain in sling at all times except RoM exercises and hygiene. Can perform pendulums, elbow/wrist/hand RoM exercises. Passive RoM allowed to 90 FF and 30 ER.  Plan for PT starting on POD #3-4." Shoulder Interventions: Shoulder sling/immobilizer;Shoulder abduction pillow Precaution Booklet Issued: Yes (comment) Restrictions Weight Bearing Restrictions: Yes LUE Weight Bearing: Non weight bearing      Mobility  Bed Mobility             General bed mobility comments: Deferred (pt in recliner beginning/end of session--no bed in pt's room anymore)    Transfers Overall transfer level: Needs assistance Equipment used: None;Quad cane Transfers: Sit to/from Stand Sit to Stand: Min guard;Supervision           General transfer comment: CGA first trial standing (no UE support) and SBA standing 2nd trial (with [facility])    Ambulation/Gait Ambulation/Gait assistance: [employer]  guard Gait Distance (Feet):  (15 feet; 80 feet) Assistive device: None;1 person hand held assist;Quad cane   Gait velocity: decreased     General Gait Details: 1st trial ambulating pt with flexed B hips  and knees (CGA ambulating first 7-8 feet and hand hold assist to walk back to recliner)--pt's husband reports pt in more flexed position than normal; 2nd trial ambulating (after pt layed back in recliner to stretch hip flexors x5 minutes--pt reports that is what she does at home before getting up) pt CGA 80 feet (pt appearing to fatigue walking last 30 feet back to recliner so (with cueing for safe way to guard/assist pt) pt's husband provided CGA rest of distance back to recliner  Stairs Stairs: Yes Stairs assistance: Min guard Stair Management: One rail Right;Step to pattern;Forwards Number of Stairs: 4 General stair comments: steady safe stairs navigation  Wheelchair Mobility    Modified Rankin (Stroke Patients Only)       Balance Overall balance assessment: Needs assistance Sitting-balance support: No upper extremity supported;Feet supported Sitting balance-Leahy Scale: Good Sitting balance - Comments: steady sitting reaching within BOS   Standing balance support: Single extremity supported;During functional activity Standing balance-Leahy Scale: Fair Standing balance comment: pt requiring single UE support (use of SBQC) for ambulation                             Pertinent Vitals/Pain Pain Assessment: 0-10 Pain Score: 3  Pain Location: L shoulder Pain Descriptors / Indicators: Sharp Pain Intervention(s): Limited activity within patient's tolerance;Monitored during session;Premedicated before session;Repositioned;Other (comment) (polar care applied)    Home Living Family/patient expects to be discharged to:: Private residence Living Arrangements: Spouse/significant other Available Help at Discharge: Family;Available PRN/intermittently Type of Home: House Home Access: Stairs to enter Entrance Stairs-Rails: Right;Left;Can reach both Entrance Stairs-Number of Steps: 3   Home Layout: One level Home Equipment: Conservation officer, nature (2 wheels);Cane - single point;Shower  seat - built in;Grab bars - tub/shower;Toilet riser      Prior Function Prior Level of Function : Independent/Modified Independent             Mobility Comments: Typically no AD use prior to surgery but tending to hold onto furniture in home as needed and would use SBQC at times if needed.       Hand Dominance        Extremity/Trunk Assessment   Upper Extremity Assessment Upper Extremity Assessment: Defer to OT evaluation RUE Deficits / Details: per OT eval "ROM WFL, MMT grossly 4-/5, pt is R handed" LUE Deficits / Details: per OT eval "still with block not completely resolved. Able to wiggle fingers and some sensation returning distally." LUE: Unable to fully assess due to immobilization    Lower Extremity Assessment Lower Extremity Assessment:  (4+/5 B hip flexion, knee flexion/extension, and DF; B knee extension to neutral; B hips flexed in standing (pt reports baseline) causing B knee flexion in standing)    Cervical / Trunk Assessment Cervical / Trunk Assessment: Other exceptions Cervical / Trunk Exceptions: forward head/shoulders  Communication   Communication: No difficulties  Cognition Arousal/Alertness: Awake/alert Behavior During Therapy: WFL for tasks assessed/performed Overall Cognitive Status: Within Functional Limits for tasks assessed                                          General Comments  General comments (skin integrity, edema, etc.): L UE in shoulder sling and abduction pillow.  Nursing cleared pt for participation in physical therapy.  Pt agreeable to PT session.  Pt's husband present entire session.    Exercises Ambulation and stairs training   Assessment/Plan    PT Assessment Patient needs continued PT services  PT Problem List Decreased strength;Decreased range of motion;Decreased activity tolerance;Decreased balance;Decreased mobility;Decreased knowledge of precautions;Pain;Impaired sensation;Decreased skin integrity        PT Treatment Interventions DME instruction;Gait training;Stair training;Functional mobility training;Therapeutic activities;Therapeutic exercise;Balance training;Patient/family education    PT Goals (Current goals can be found in the Care Plan section)  Acute Rehab PT Goals Patient Stated Goal: to go home PT Goal Formulation: With patient/family Time For Goal Achievement: 08/13/21 Potential to Achieve Goals: Good    Frequency BID   Barriers to discharge        Co-evaluation               AM-PAC PT "6 Clicks" Mobility  Outcome Measure Help needed turning from your back to your side while in a flat bed without using bedrails?: A Little Help needed moving from lying on your back to sitting on the side of a flat bed without using bedrails?: A Little Help needed moving to and from a bed to a chair (including a wheelchair)?: A Little Help needed standing up from a chair using your arms (e.g., wheelchair or bedside chair)?: A Little Help needed to walk in hospital room?: A Little Help needed climbing 3-5 steps with a railing? : A Little 6 Click Score: 18    End of Session Equipment Utilized During Treatment: Gait belt;Other (comment) (L shoulder sling/abduction pillow) Activity Tolerance: No increased pain Patient left: in chair;with nursing/sitter in room;with family/visitor present;with call bell/phone within reach Nurse Communication: Mobility status;Precautions;Weight bearing status PT Visit Diagnosis: Unsteadiness on feet (R26.81);Other abnormalities of gait and mobility (R26.89);Muscle weakness (generalized) (M62.81);Pain Pain - Right/Left: Left Pain - part of body: Shoulder    Time: 7824-2353 PT Time Calculation (min) (ACUTE ONLY): 36 min   Charges:   PT Evaluation $PT Eval Low Complexity: 1 Low PT Treatments $Gait Training: 8-22 mins $Therapeutic Activity: 8-22 mins       Leitha Bleak, PT 07/30/21, 4:47 PM

## 2021-07-30 NOTE — Evaluation (Signed)
Occupational Therapy Evaluation Patient Details Name: Kristi Lee MRN: [id number] DOB: Apr 02, 1956 Today's Date: 07/30/2021   History of Present Illness Pt is a 65 y/o F with PMH: HTN, DVT, GERD, depression, and scoliosis who is s/p elective L reverse TSA 2/2  Left shoulder rotator cuff arthropathy on 12/16 with Dr. Posey Pronto.   Clinical Impression   Pt seen for OT evaluation this date s/p elective L reverse TSA. She presents this date with block not completely resolved, some numbness in L UE, but sensation returning and able to wiggle fingers. She reports that she lives with spouse and is INDEP/MOD INDEP at baseline (including using reacher and sock aide for LB dressing d/t h/o back surgeries). This date, pt requires:  MOD A For UB ADLs including dressing (donning button-down shirt using modifed technique) and MOD A for LB ADLS including donning elastic wasit pants and underwear. Pt requires increased time, assist for stability with standing clothing mgt over hips as well. MAX A For polar care and sling mgt. OT ed with pt and spouse re: precautions/restrictions, polar care and sling application, UB ADL modified techniques. Both parties with good understanding. OT assists pt to restroom with CGA progressing to SUPV, but only facilitates ~4-5 steps to recliner and then from recliner to commode rather than walking full distance as pt's BP noted to be slightly orthostatic (pt not symptomatic, sitting 127/69, standing 104/86). Pt left in chair dressed, with polar care and sling applied, with all needs met and in reach. Will continue to follow acutely. Recommend OPOT f/u.     Recommendations for follow up therapy are one component of a multi-disciplinary discharge planning process, led by the attending physician.  Recommendations may be updated based on patient status, additional functional criteria and insurance authorization.   Follow Up Recommendations  Outpatient OT    Assistance Recommended at  Discharge Frequent or constant Supervision/Assistance  Functional Status Assessment  Patient has had a recent decline in their functional status and demonstrates the ability to make significant improvements in function in a reasonable and predictable amount of time.  Equipment Recommendations  None recommended by OT    Recommendations for Other Services       Precautions / Restrictions Precautions Precautions: Shoulder;Fall Type of Shoulder Precautions: L shoulder dangle for ADLs only Shoulder Interventions: Shoulder sling/immobilizer;Shoulder abduction pillow Restrictions Weight Bearing Restrictions: Yes LUE Weight Bearing: Non weight bearing      Mobility Bed Mobility Overal bed mobility: Modified Independent             General bed mobility comments: MOD I to perform sup to sit on stretcher with HOB elevated, increased time.    Transfers Overall transfer level: Needs assistance Equipment used: 1 person hand held assist Transfers: Sit to/from Stand Sit to Stand: Min guard;Supervision           General transfer comment: CGA with progress to supervision.      Balance Overall balance assessment: Needs assistance Sitting-balance support: Feet supported Sitting balance-Leahy Scale: Good     Standing balance support: Single extremity supported Standing balance-Leahy Scale: Fair Standing balance comment: benefits from unilateral support on R side d/t some mild sway                           ADL either performed or assessed with clinical judgement   ADL Overall ADL's : Needs assistance/impaired  General ADL Comments: requires MOD A For UB ADLs including dressing (donning button-down shirt using modifed technique) and MOD A for LB ADLS including donning elastic wasit pants and underwear. Pt requires increased time, assist for stability with standing clothing mgt over hips as well. MAX A For polar  care and sling mgt.     Vision Patient Visual Report: No change from baseline       Perception     Praxis      Pertinent Vitals/Pain Pain Assessment: No/denies pain (still with block not completely resolved.)     Hand Dominance     Extremity/Trunk Assessment Upper Extremity Assessment Upper Extremity Assessment: RUE deficits/detail;LUE deficits/detail RUE Deficits / Details: ROM WFL, MMT grossly 4-/5, pt is R handed LUE Deficits / Details: still with block not completely resolved. Able to wiggle fingers and some sensation returning distally. LUE: Unable to fully assess due to immobilization   Lower Extremity Assessment Lower Extremity Assessment: Defer to PT evaluation (some baseline weakness and decreased tolerance for hip flexion d/t h/o back surgeries for scoliosis.)       Communication Communication Communication: No difficulties   Cognition Arousal/Alertness: Awake/alert Behavior During Therapy: WFL for tasks assessed/performed Overall Cognitive Status: Within Functional Limits for tasks assessed                                       General Comments       Exercises Other Exercises Other Exercises: OT ed with pt and spouse re: role of OT, UB dressing and bathing modified techniques, sling and polar care mgt, precautions post-op'ly.   Shoulder Instructions      Home Living Family/patient expects to be discharged to:: Private residence Living Arrangements: Spouse/significant other Available Help at Discharge: Family Type of Home: House Home Access: Stairs to enter Technical brewer of Steps: 3 Entrance Stairs-Rails: Can reach both [place]: One level     Bathroom Shower/Tub: Occupational psychologist: [place]: Conservation officer, nature (2 wheels);Cane - single point;Shower seat - built in;Grab bars - tub/shower          Prior Functioning/Environment Prior Level of Function : Independent/Modified  Independent                        OT Problem List: Decreased strength;Decreased range of motion;Decreased activity tolerance;Impaired UE functional use      OT Treatment/Interventions: Self-care/ADL training;Therapeutic exercise;Therapeutic activities;Patient/family education    OT Goals(Current goals can be found in the care plan section) Acute Rehab OT Goals Patient Stated Goal: to heal shoulder and get more INDEP OT Goal Formulation: With patient/family Time For Goal Achievement: 08/13/21 Potential to Achieve Goals: Good ADL Goals Pt Will Perform Upper Body Bathing: with modified independence (hemi technique) Pt Will Perform Upper Body Dressing: with modified independence (hemi technique) Additional ADL Goal #1: Pt's spouse will be INDEP with assisting with polar care and sling demonstrating correct application w/ no cues  OT Frequency: Min 2X/week   Barriers to D/C:            Co-evaluation              AM-PAC OT "6 Clicks" Daily Activity     Outcome Measure Help from another person eating meals?: None Help from another person taking care of personal grooming?: None Help from another person toileting, which includes  using toliet, bedpan, or urinal?: A Lot Help from another person bathing (including washing, rinsing, drying)?: A Lot Help from another person to put on and taking off regular upper body clothing?: A Lot Help from another person to put on and taking off regular lower body clothing?: A Lot 6 Click Score: 16   End of Session Nurse Communication: Mobility status;Other (comment) (BP slightly orthostatic from sit to stand (127/69 sitting and 104/86 standing))  Activity Tolerance: Patient tolerated treatment well Patient left: in chair;with call bell/phone within reach;with family/visitor present  OT Visit Diagnosis: Unsteadiness on feet (R26.81);Muscle weakness (generalized) (M62.81)                Time: 5883-2549 OT Time Calculation (min): 59  min Charges:  OT General Charges $OT Visit: 1 Visit OT Evaluation $OT Eval Moderate Complexity: 1 Mod OT Treatments $Self Care/Home Management : 23-37 mins $Therapeutic Activity: 8-22 mins  Gerrianne Scale, MS, OTR/L ascom [phone] 07/30/21, 3:47 PM

## 2021-07-30 NOTE — Discharge Instructions (Addendum)
Kristi Mulligan, MD  [facility]  Phone: [phone]  Fax: [phone]   Discharge Instructions after Reverse Shoulder Replacement    1. Activity/Sling: You are to be non-weight bearing on operative extremity. A sling/shoulder immobilizer has been provided for you. Only remove the sling to perform elbow, wrist, and hand RoM exercises and hygiene/dressing. Active reaching and lifting are not permitted. You will be given further instructions on sling use at your first physical therapy visit and postoperative visit with Dr. Posey Pronto.   2. Dressings: Dressing may be removed at 1st physical therapy visit (~3-4 days after surgery). Afterwards, you may either leave open to air (if no drainage) or cover with dry, sterile dressing. If you have steri-strips on your wound, please do not remove them. They will fall off on their own. You may shower 5 days after surgery. Please pat incision dry. Do not rub or place any shear forces across incision. If there is drainage or any opening of incision after 5 days, please notify our offices immediately.    3. Driving:  Plan on not driving for six weeks. Please note that you are advised NOT to drive while taking narcotic pain medications as you may be impaired and unsafe to drive.   4. Medications:  - You have been provided a prescription for narcotic pain medicine (usually oxycodone). After surgery, take 1-2 narcotic tablets every 4 hours if needed for severe pain. Please start this as soon as you begin to start having pain (if you received a nerve block, start taking as soon as this wears off).  - A prescription for anti-nausea medication will be provided in case the narcotic medicine causes nausea - take 1 tablet every 6 hours only if nauseated.  - Take enteric coated aspirin 325 mg once daily for 6 weeks to prevent blood clots. Do not take aspirin if you have an aspirin sensitivity/allergy or asthma or are on an anticoagulant (blood thinner) already. If so, then  your home anticoagulant can be resumed the day after surgery and managed - do not take aspirin. - Take tylenol 1000mg  (2 Extra strength or 3 regular strength tablets) every 8 hours for pain. This will reduce the amount of narcotic medication needed. May stop tylenol when you are having minimal pain. - Take a stool softener (Colace, Dulcolax or Senakot) if you are using narcotic pain medications to help with constipation that is associated with narcotic use. - DO NOT take ANY nonsteroidal anti-inflammatory pain medications: Advil, Motrin, Ibuprofen, Aleve, Naproxen, or Naprosyn.   If you are taking prescription medication for anxiety, depression, insomnia, muscle spasm, chronic pain, or for attention deficit disorder you are advised that you are at a higher risk of adverse effects with use of narcotics post-op, including narcotic addiction/dependence, depressed breathing, death. If you use non-prescribed substances: alcohol, marijuana, cocaine, heroin, methamphetamines, etc., you are at a higher risk of adverse effects with use of narcotics post-op, including narcotic addiction/dependence, depressed breathing, death. You are advised that taking > 50 morphine milligram equivalents (MME) of narcotic pain medication per day results in twice the risk of overdose or death. For your prescription provided: oxycodone 5 mg - taking more than 6 tablets per day after the first few days of surgery.   5. Physical Therapy: 1-2 times per week for ~12 weeks. Therapy typically starts on post operative Day 3 or 4. You have been provided an order for physical therapy. The therapist will provide home exercises. Please contact our offices if this appointment  has not been scheduled.    6. Work: May do light duty/desk job in approximately 2 weeks when off of narcotics, pain is well-controlled, and swelling has decreased if able to function with one arm in sling. Full work may take 6 weeks if light motions and function of both  arms is required. Lifting jobs may require 12 weeks.   7. Post-Op Appointments: Your first post-op appointment will be with Dr. Posey Pronto in approximately 2 weeks time.    If you find that they have not been scheduled please call the Orthopaedic Appointment front desk at [phone].                               Kristi Mulligan, MD [facility] Phone: [phone] Fax: [phone]   REVERSE SHOULDER ARTHROPLASTY REHAB GUIDELINES   These guidelines should be tailored to individual patients based on their rehab goals, age, precautions, quality of repair, etc.  Progression should be based on patient progress and approval by the referring physician.  PHASE 1 - Day 1 through Week 2  GENERAL GUIDELINES AND PRECAUTIONS Sling wear 24/7 except during grooming and home exercises (3 to 5 times daily) Avoid shoulder extension such that the arm is posterior the frontal plane.  When patients recline, a pillow should be placed behind the upper arm and sling should be on.  They should be advised to always be able to see the elbow Avoid combined IR/ADD/EXT, such as hand behind back to prevent dislocation Avoid combined IR and ADD such as reaching across the chest to prevent dislocation No AROM No submersion in pool/water for 4 weeks No weight bearing through operative arm (as in transfers, walker use, etc)  GOALS Maintain integrity of joint replacement; protect soft tissue healing Increase PROM for elevation to 120 and ER to 30 (will remain the goal for first 6 weeks) Optimize distal UE circulation and muscle activity (elbow, wrist and hand) Instruct in use of sling for proper fit, polar care device for ice application after HEP, signs/symptoms of infection  EXERCISES Active elbow, wrist and hand Passive forward elevation in scapular plane to 90-120 max motion; ER in scapular plane to 30 Active scapular retraction with arms resting in neutral position  CRITERIA  TO PROGRESS TO PHASE 2 Low pain (less than 3/10) with shoulder PROM Healing of incision without signs of infection Clearance by MD to advance after 2 week MD check up  PHASE 2 - 2 weeks - 6 weeks  GENERAL GUIDELINES AND PRECAUTIONS Sling may be removed while at home; worn in community without abduction pillow May use arm for light activities of daily living (such as feeding, brushing teeth, dressing) with elbow near  the side of the body  and arm in front of the body- no active lifting of the arm May submerge in water (tub, pool, [place], etc) after 4 weeks Continue to avoid WBing through the operative arm Continue to avoid combined IR/EXT/ADD (hand behind the back) and IR/ADD  (reaching across chest) for dislocation precautions  GOALS  Achieve passive elevation to 120 and ER to 30  Low (less than 3/10) to no pain  Ability to fire all heads of the deltoid  EXERCISES May discontinue grip, and active elbow and wrist exercises since using the arm in ADLs  with sling removed around the home Continue passive elevation to 120 and ER to 30, both in scapular plane with arm supported on table top Add  submaximal isometrics, pain free effort, for all functional heads of deltoid (anterior, posterior, middle)  Ensure that with posterior deltoid isometric the shoulder does not move into extension and the arm remains anterior the frontal plane At 4 weeks:  begin to place arm in balanced position of 90 deg elevation in supine; when patient able to hold this position with ease, may begin reverse pendulums clockwise and counterclockwise  CRITERIA TO PROGRESS TO PHASE 3 Passive forward elevation in scapular plane to 120; passive ER in scapular plane to 30 Ability to fire isometrically all heads of the deltoid muscle without pain Ability to place and hold the arm in balanced position (90 deg elevation in supine)  PHASE 3 - 6 weeks to 3 months  GENERAL GUIDELINES AND PRECAUTIONS Discontinue use of  sling Avoid forcing end range motion in any direction to prevent dislocation  May advance use of the arm actively in ADLs without being restricted to arm by the side of the body, however, avoid heavy lifting and sports (forever!) May initiate functional IR behind the back gently NO UPPER BODY ERGOMETER   GOALS Optimize PROM for elevation and ER in scapular plane with realistic expectation that max  mobility for elevation is usually around 145-160 passively; ER 40 to 50 passively; functional IR to L1 Recover AROM to approach as close to PROM available as possible; may expect 135-150 deg active elevation; 30 deg active ER; active functional IR to L1 Establish dynamic stability of the shoulder with deltoid and periscapular muscle gradual strengthening  EXERCISES Forward elevation in scapular plane active progression: supine to incline, to vertical; short to long lever arm Balanced position long lever arm AROM Active ER/IR with arm at side Scapular retraction with light band resistance Functional IR with hand slide up back - very gentle and gradual NO UPPER BODY ERGOMETER     CRITERIA TO PROGRESS TO PHASE 4  AROM equals/approaches PROM with good mechanics for elevation   No pain  Higher level demand on shoulder than ADL functions   PHASE 4 12 months and beyond  GENERAL GUIDELINES AND PRECAUTIONS No heavy lifting and no overhead sports No heavy pushing activity Gradually increase strength of deltoid and scapular stabilizers; also the rotator cuff if present with weights not to exceed 5 lbs NO UPPER BODY ERGOMETER   GOALS  Optimize functional use of the operative UE to meet the desired demands  Gradual increase in deltoid, scapular muscle, and rotator cuff strength  Pain free functional activities   EXERCISES Add light hand weights for deltoid up to and not to exceed 3 lbs for anterior and posterior with long arm lift against gravity; elbow bent to 90 deg for abduction in scapular  plane Theraband progression for extension to hip with scapular depression/retraction Theraband progression for serratus anterior punches in supine; avoid wall, incline or prone pressups for serratus anterior End range stretching gently without forceful overpressure in all planes (elevation in scapular plane, ER in scapular plane, functional IR) with stretching done for life as part of a daily routine NO UPPER BODY ERGOMETER     CRITERIA FOR DISCHARGE FROM SKILLED PHYSICAL THERAPY  Pain free AROM for shoulder elevation (expect around 135-150)  Functional strength for all ADLs, work tasks, and hobbies approved by Psychologist, sport and exercise  Independence with home maintenance program   NOTES: 1. With proper exercise, motion, strength, and function continue to improve even after one year. 2. The complication rate after surgery is 5 - 8%. Complications include infection, fracture, heterotopic  bone formation, nerve injury, instability, rotator cuff tear, and tuberosity nonunion. Please look for clinical signs, unusual symptoms, or lack of progress with therapy and report those to Dr. Posey Pronto. Prefer more communication than less.  3. The therapy plan above only serves as a guide. Please be aware of specific individualized patient instructions as written on the prescription or through discussions with the surgeon. 4. Please call Dr. Posey Pronto if you have any specific questions or concerns [phone]   AMBULATORY SURGERY  DISCHARGE INSTRUCTIONS   The drugs that you were given will stay in your system until tomorrow so for the next 24 hours you should not:  Drive an automobile Make any legal decisions Drink any alcoholic beverage   You may resume regular meals tomorrow.  Today it is better to start with liquids and gradually work up to solid foods.  You may eat anything you prefer, but it is better to start with liquids, then soup and crackers, and gradually work up to solid foods.   Please notify your doctor  immediately if you have any unusual bleeding, trouble breathing, redness and pain at the surgery site, drainage, fever, or pain not relieved by medication.    Additional Instructions:        Please contact your physician with any problems or Same Day Surgery at [phone], Monday through Friday 6 am to 4 pm, or [place] at [facility] number at [phone].

## 2021-07-30 NOTE — Anesthesia Preprocedure Evaluation (Addendum)
Anesthesia Evaluation  Patient identified by MRN, date of birth, ID band Patient awake    Reviewed: Allergy & Precautions, H&P , NPO status , Patient's Chart, lab work & pertinent test results  History of Anesthesia Complications (+) PONV and history of anesthetic complications  Airway Mallampati: II  TM Distance: >3 FB Neck ROM: full    Dental no notable dental hx.    Pulmonary shortness of breath and with exertion,  Currently being worked up for OSA, and new pulmonary findings on CT.   Pulmonary exam normal breath sounds clear to auscultation       Cardiovascular Exercise Tolerance: Poor hypertension, Pt. on home beta blockers (-) anginaNormal cardiovascular exam+ Valvular Problems/Murmurs MR  Rhythm:regular Rate:Normal  H/o DVT  EKG 10/11: Sinus rhythm Incomplete RBBB and LAFB Right ventricular hypertrophy  ECHO 7/21: 1. Left ventricular ejection fraction, by estimation, is 55 to 60%. The  left ventricle has normal function. The left ventricle has no regional  wall motion abnormalities. There is mild left ventricular hypertrophy.  Left ventricular diastolic parameters  were normal.  2. Right ventricular systolic function is normal. The right ventricular  size is normal. There is normal pulmonary artery systolic pressure.  3. The mitral valve is degenerative. Mild mitral valve regurgitation. No  evidence of mitral stenosis.  4. The aortic valve is tricuspid. Aortic valve regurgitation is not  visualized. No aortic stenosis is present.  5. The inferior vena cava is normal in size with greater than 50%  respiratory variability, suggesting right atrial pressure of 3 mmHg.   Neuro/Psych PSYCHIATRIC DISORDERS Depression    GI/Hepatic GERD  Medicated and Controlled,  Endo/Other  Morbid obesity  Renal/GU      Musculoskeletal  (+) Arthritis , Osteoarthritis,    Abdominal Normal abdominal exam  (+) - obese,    Peds  Hematology  (+) anemia ,   Anesthesia Other Findings   Reproductive/Obstetrics                            Anesthesia Physical  Anesthesia Plan  ASA: III  Anesthesia Plan: General   Post-op Pain Management: Regional block   Induction: Intravenous  PONV Risk Score and Plan: 2 and Treatment may vary due to age or medical condition, TIVA, Midazolam, Ondansetron, Dexamethasone and Aprepitant  Airway Management Planned: Oral ETT  Additional Equipment:   Intra-op Plan:   Post-operative Plan:   Informed Consent: I have reviewed the patients History and Physical, chart, labs and discussed the procedure including the risks, benefits and alternatives for the proposed anesthesia with the patient or authorized representative who has indicated his/her understanding and acceptance.     Dental Advisory Given  Plan Discussed with: CRNA and Anesthesiologist  Anesthesia Plan Comments:        Anesthesia Quick Evaluation

## 2021-07-30 NOTE — Anesthesia Procedure Notes (Signed)
Procedure Name: Intubation Date/Time: 07/30/2021 7:40 AM Performed by: Natasha Mead, CRNA Pre-anesthesia Checklist: Patient identified, Emergency Drugs available, Suction available and Patient being monitored Patient Re-evaluated:Patient Re-evaluated prior to induction Oxygen Delivery Method: Circle system utilized Preoxygenation: Pre-oxygenation with 100% oxygen Induction Type: IV induction Ventilation: Mask ventilation without difficulty Laryngoscope Size: Miller and 2 Grade View: Grade I Tube type: Oral Tube size: 7.0 mm Number of attempts: 1 Airway Equipment and Method: Stylet and Oral airway Placement Confirmation: ETT inserted through vocal cords under direct vision, positive ETCO2 and breath sounds checked- equal and bilateral Secured at: 21 cm Tube secured with: Tape Dental Injury: Teeth and Oropharynx as per pre-operative assessment

## 2021-07-30 NOTE — Op Note (Signed)
SURGERY DATE: 07/30/2021   PRE-OP DIAGNOSIS:  1. Left shoulder rotator cuff arthropathy   POST-OP DIAGNOSIS:  1. Left shoulder rotator cuff arthropathy   PROCEDURES:  1. Left reverse total shoulder arthroplasty 2. Left biceps tenodesis   SURGEON: Cato Mulligan, MD  ASSISTANTS: Reche Dixon, PA   ANESTHESIA: Gen + interscalene block   ESTIMATED BLOOD LOSS: 200cc   TOTAL IV FLUIDS: per anesthesia record  IMPLANTS: [employer] Surgical: RSP Glenoid Head w/Retaining screw 32-4; Monoblock Reverse Shoulder Baseplate with 0.3OZ central screw; 3 locking screws into baseplate (superior, posterior, inferior); Small Shell Short Humeral Stem 10 x 109m; Neutral Small Socket Insert;    INDICATION(S):  Kristi SCHEELERis a 65y.o. female with chronic shoulder pain with inability to lift arm overhead. Imaging consistent with massive, irreparable rotator cuff tear. Conservative measures including medications, activity modifications, and cortisone injections have not provided adequate relief. After discussion of risks, benefits, and alternatives to surgery, the patient elected to proceed with reverse shoulder arthroplasty and biceps tenodesis.   OPERATIVE FINDINGS: massive rotator cuff tear (complete supraspinatus, partial infraspinatus, partial subscapularis)   OPERATIVE REPORT:   I identified Kristi AXELRODin the pre-operative holding area. Informed consent was obtained and the surgical site was marked. I reviewed the risks and benefits of the proposed surgical intervention and the patient (and/or patient's guardian) wished to proceed. An interscalene block with Exparel was administered by the Anesthesia team. The patient was transferred to the operative suite and general anesthesia was administered. The patient was placed in the beach chair position with the head of the bed elevated approximately 45 degrees. All down side pressure points were appropriately padded. Pre-op exam under anesthesia  confirmed some stiffness and crepitus. Appropriate IV antibiotics were administered. The extremity was then prepped and draped in standard fashion. A time out was performed confirming the correct extremity, correct patient, and correct procedure.   We used the standard deltopectoral incision from the coracoid to ~12cm distal. We found the cephalic vein and took it laterally. We opened the deltopectoral interval widely and placed retractors under the CA ligament in the subacromial space and under the deltoid tendon at its insertion. We then abducted and internally rotated the arm and released the underlying bursa between these retractors, taking care not to damage the circumflex branch of the axillary nerve.   Next, we brought the arm back in adduction at slight forward flexion with external rotation. We opened the clavipectoral fascia lateral to the conjoint tendon. We gently palpated the axillary nerve and verified its position and continuity on both sides of the humerus with a Tug test. This test was repeated multiple times during the procedure for nerve localization and confirmed to be intact at the end of the case. We then cauterized the anterior humeral circumflex (Three sisters) vessels. The arm was then internally rotated, we cut the falciform ligament at approximately 1 cm of the upper portion of the pectoralis major insertion. Next we unroofed the bicipital groove. We proceeded with a soft tissue biceps tenodesis given the pathology (significant thickening and tendinopathy proximally) of the tendon.  After opening the biceps tendon sheath all the way to the supraglenoid tubercle, we performed a biceps tenodesis with two #2 TiCron sutures to the upper border of the pectoralis major. The proximal portion of the tendon was excised.   At this point, we could see that the supraspinatus was completely torn with a bald humeral head superiorly. The subscapularis also had a partial tear  of the superior  fibers. We performed a subscapularis peel using electrocautery to remove the anterior capsule and subscapularis off of the humeral head. We released the inferior capsule from the humerus all the way to the posterior band of the inferior glenohumeral ligament. When this was complete we gently dislocated the shoulder up into the wound. We removed any osteophytes and made our cut with the appropriate inclination in 30 degrees of retroversion  We then turned our attention back to the glenoid. The proximal humerus was retracted posteriorly. The anterior capsule was dissected free from the subscapularis. The anterior capsule was then excised, exposing the anterior glenoid. We then grasped the labrum and removed it circumferentially. During the glenoid exposure, the axillary nerve was protected the entire time.    A patient-specific guide was used to drill the central guidepin. A tap was placed, matching the trajectory of the guidepin. An appropriately sized reamer was used to ream the glenoid. The monoblock baseplate was inserted and excellent fixation was achieved such that the entire scapula rotated with further attempted seating of the baseplate. The 3 peripheral screws were drilled, measured, and placed. A 32-4 glenosphere was then placed and tightened.   We then turned our attention back to the humerus. We sized for a small shell prosthesis. We sequentially used larger diameter canal finders until we met significant resistance and sequential broaching was performed to this size listed above. We trialed an 0 poly. The humerus was trialed and noted to have satisfactory stability, motion, and deltoid tension. The trial implants were removed. The humeral canal was pulse lavaged. 3 drill holes were placed about the lesser tuberosity footprint and FiberWire sutures were passed through these holes for subscapularis repair. Next, the implant was placed with the appropriate retroversion. Bone graft was packed into the  humeral canal and under the shell prior to final seating of the humeral component. Stability was confirmed. We placed an 0 poly. The humerus was reduced and motion, tension, and stability were satisfactory. A Hemovac drain was placed. Subscapularis was repaired with the previously passed #2 FiberWire sutures after passing them through the subscapularis.   We again verified the tension on the axillary nerve, appropriate range of motion, stability of the implant, and security of the subscapularis repair. We closed the deltopectoral interval deep to the cephalic vein with a running, 0-Vicryl suture. The skin was closed with 2-0 Vicryl and 4-0 Monocryl. Xeroform and Honeycomb dressing was applied. A PolarCare unit and sling were placed. Patient was extubated, transferred to a stretcher bed and to the post antesthesia care unit in stable condition.   Of note, assistance from a PA was essential to performing the surgery.  PA was present for the entire surgery.  PA assisted with patient positioning, retraction, instrumentation, and wound closure. The surgery would have been more difficult and had longer operative time without PA assistance.    POSTOPERATIVE PLAN: The patient will be admitted with plan for discharge home on POD#1. Operative arm to remain in sling at all times except RoM exercises and hygiene. Can perform pendulums, elbow/wrist/hand RoM exercises. Passive RoM allowed to 90 FF and 30 ER. Resume home Xarelto on POD#1. Plan for PT starting on POD #3-4. Patient to return to clinic in ~2 weeks for post-operative appointment.

## 2021-07-30 NOTE — Transfer of Care (Signed)
Immediate Anesthesia Transfer of Care Note  Patient: Kristi Lee  Procedure(s) Performed: Left reverse shoulder arthroplasty, biceps tenodesis (Left: Shoulder)  Patient Location: PACU  Anesthesia Type:General  Level of Consciousness: awake, alert  and oriented  Airway & Oxygen Therapy: Patient Spontanous Breathing and Patient connected to nasal cannula oxygen  Post-op Assessment: Report given to RN and Post -op Vital signs reviewed and stable  Post vital signs: stable  Last Vitals:  Vitals Value Taken Time  BP 93/49 07/30/21 1048  Temp    Pulse 85 07/30/21 1052  Resp 22 07/30/21 1052  SpO2 100 % 07/30/21 1052  Vitals shown include unvalidated device data.  Last Pain:  Vitals:   07/30/21 0626  TempSrc: Tympanic  PainSc: 5          Complications: No notable events documented.

## 2021-08-02 ENCOUNTER — Encounter: Payer: Self-pay | Admitting: Orthopedic Surgery

## 2021-08-02 LAB — SURGICAL PATHOLOGY

## 2021-08-02 NOTE — H&P (Signed)
DELAYED ENTRY.   Preop H&P performed on 07/30/21 prior to surgery ~720am.   Paper H&P to be scanned into permanent record. H&P reviewed. No significant changes noted.

## 2021-08-02 NOTE — Progress Notes (Signed)
Preop labs prior to surgery.

## 2021-08-07 NOTE — Anesthesia Postprocedure Evaluation (Signed)
Anesthesia Post Note  Patient: Kristi Lee  Procedure(s) Performed: Left reverse shoulder arthroplasty, biceps tenodesis (Left: Shoulder)  Patient location during evaluation: PACU Anesthesia Type: General Level of consciousness: awake and alert Pain management: pain level controlled Vital Signs Assessment: post-procedure vital signs reviewed and stable Respiratory status: spontaneous breathing, nonlabored ventilation and respiratory function stable Cardiovascular status: blood pressure returned to baseline and stable Postop Assessment: no apparent nausea or vomiting Anesthetic complications: no   No notable events documented.   Last Vitals:  Vitals:   07/30/21 1153 07/30/21 1607  BP: 109/66 118/79  Pulse:  81  Resp: 18 16  Temp: (!) 35.5 C   SpO2: 90% 96%    Last Pain:  Vitals:   08/02/21 0837  TempSrc:   PainSc: 0-No pain                 [place] Ouch

## 2021-08-27 ENCOUNTER — Inpatient Hospital Stay: Payer: Medicare Other | Admitting: Oncology

## 2021-08-27 ENCOUNTER — Inpatient Hospital Stay: Payer: Medicare Other | Attending: Oncology

## 2021-09-05 ENCOUNTER — Other Ambulatory Visit: Payer: Self-pay

## 2021-09-05 ENCOUNTER — Ambulatory Visit (INDEPENDENT_AMBULATORY_CARE_PROVIDER_SITE_OTHER): Payer: Medicare Other

## 2021-09-05 ENCOUNTER — Encounter: Payer: Self-pay | Admitting: Emergency Medicine

## 2021-09-05 ENCOUNTER — Ambulatory Visit
Admission: EM | Admit: 2021-09-05 | Discharge: 2021-09-05 | Disposition: A | Discharge status: Home / Self Care | Payer: Medicare Other | Attending: Medical Oncology | Admitting: Medical Oncology

## 2021-09-05 DIAGNOSIS — R059 Cough, unspecified: Secondary | ICD-10-CM

## 2021-09-05 DIAGNOSIS — R052 Subacute cough: Secondary | ICD-10-CM

## 2021-09-05 DIAGNOSIS — R509 Fever, unspecified: Secondary | ICD-10-CM

## 2021-09-05 DIAGNOSIS — J0100 Acute maxillary sinusitis, unspecified: Secondary | ICD-10-CM | POA: Diagnosis not present

## 2021-09-05 MED ORDER — AMOXICILLIN-POT CLAVULANATE 875-125 MG PO TABS: 1.0000 | ORAL_TABLET | Freq: Two times a day (BID) | ORAL | 0 refills | Status: DC

## 2021-09-05 MED ORDER — BENZONATATE 100 MG PO CAPS: 100.0000 mg | ORAL_CAPSULE | Freq: Three times a day (TID) | ORAL | 0 refills | Status: DC

## 2021-09-05 MED ORDER — FLUTICASONE PROPIONATE 50 MCG/ACT NA SUSP: 2.0000 | Freq: Every day | NASAL | 0 refills | Status: DC

## 2021-09-05 NOTE — ED Triage Notes (Signed)
Patient c/o cough, chest congestion, headache, fever that started 2-3 weeks ago.

## 2021-09-05 NOTE — ED Provider Notes (Signed)
[facility]    CSN: [id number] Arrival date & time: 09/05/21  0836      History   Chief Complaint Chief Complaint  Patient presents with   Cough   Headache    HPI Kristi Lee is a 66 y.o. female.   HPI  Cold Symptoms: Patient reports that they have had symptoms of cough, chest congestion, headache, fever for the past 2-3 weeks. Last fever was Thursday. Symptoms are failing to improve. They deny SOB, chest pain,or vomiting. They have tried Nyquil/Dayquil for symptoms. No known sick contacts.    Past Medical History:  Diagnosis Date   Arthritis    back   Depression    DVT (deep venous thrombosis) (HCC) 01/25/2020   GERD (gastroesophageal reflux disease)    Heart murmur    asymptomatic   Hypertension    PONV (postoperative nausea and vomiting)    Scoliosis    reports she has rods in her back    Patient Active Problem List   Diagnosis Date Noted   Vertigo    Dizziness    Nausea    AKI (acute kidney injury) ([place])    Hypomagnesemia    Hypokalemia    Iron deficiency anemia due to chronic blood loss    Near syncope 05/25/2021   Dyspnea on exertion 08/19/2020   Special screening for malignant neoplasms, colon    Polyp of transverse colon    DVT (deep venous thrombosis) ([place]) 11/08/2019   Burning sensation 10/17/2019   Itchy scalp 10/17/2019   Occipital neuralgia of right side 10/17/2019   Tingling 10/17/2019   Hyperlipidemia, mixed 10/10/2016   Stable angina pectoris ([place]) 10/10/2016   Adaptation reaction 01/27/2015   Back pain, chronic 01/27/2015   CN (constipation) 01/27/2015   Abnormal LFTs 01/27/2015   Essential hypertension 01/27/2015   Gastro-esophageal reflux disease without esophagitis 01/27/2015   Personal history of arthritis 01/27/2015   Insomnia 01/27/2015   Scoliosis 01/27/2015   FOM (frequency of micturition) 01/27/2015    Past Surgical History:  Procedure Laterality Date   ABDOMINAL HYSTERECTOMY     BACK SURGERY   10/2000   for scoliosis   BREAST EXCISIONAL BIOPSY Right 1990's   NEG   BROW LIFT Bilateral 07/23/2020   Procedure: BLEPHAROPLASTY UPPER EYELID; W/EXCESS SKIN BLEPHAROPTOSIS REPAIR; RESECT EX BILATERAL;  Surgeon: Karle Starch, MD;  Location: [place];  Service: Ophthalmology;  Laterality: Bilateral;   COLONOSCOPY WITH PROPOFOL N/A 03/23/2020   Procedure: COLONOSCOPY WITH PROPOFOL;  Surgeon: Lucilla Lame, MD;  Location: [place];  Service: Endoscopy;  Laterality: N/A;  priority 4   FEMUR FRACTURE SURGERY Right 07/15/2009   Pertrochanteric femur fracture.   HIP FRACTURE SURGERY Right    PERIPHERAL VASCULAR THROMBECTOMY Left 11/11/2019   Procedure: PERIPHERAL VASCULAR THROMBECTOMY;  Surgeon: Algernon Huxley, MD;  Location: [facility];  Service: Cardiovascular;  Laterality: Left;   POLYPECTOMY  03/23/2020   Procedure: POLYPECTOMY;  Surgeon: Lucilla Lame, MD;  Location: [place];  Service: Endoscopy;;   REVERSE SHOULDER ARTHROPLASTY Left 07/30/2021   Procedure: Left reverse shoulder arthroplasty, biceps tenodesis;  Surgeon: Leim Fabry, MD;  Location: [facility];  Service: Orthopedics;  Laterality: Left;   REVISION TOTAL HIP ARTHROPLASTY Left     OB History     Gravida  1   Para  1   Term      Preterm      AB      Living  SAB      IAB      Ectopic      Multiple      Live Births               Home Medications    Prior to Admission medications   Medication Sig Start Date End Date Taking? Authorizing Provider  atenolol (TENORMIN) 25 MG tablet Take 1 tablet (25 mg total) by mouth daily. Patient taking differently: Take 25 mg by mouth every morning. 10/27/20  Yes Burnette, Clearnce Sorrel, PA-C  DETROL 2 MG tablet Take 1 tablet (2 mg total) by mouth 2 (two) times daily. 05/18/21  Yes Gwyneth Sprout, FNP  omeprazole (PRILOSEC) 20 MG capsule Take 1 capsule (20 mg total) by mouth daily. Patient taking differently: Take 20 mg by  mouth every morning. 10/27/20  Yes Mar Daring, PA-C  rivaroxaban (XARELTO) 20 MG TABS tablet Take 1 tablet (20 mg total) by mouth daily with supper. 10/27/20  Yes Mar Daring, PA-C  traZODone (DESYREL) 100 MG tablet Take 1 tablet (100 mg total) by mouth at bedtime. Patient taking differently: Take 200 mg by mouth at bedtime. 10/27/20  Yes Mar Daring, PA-C  triamterene-hydrochlorothiazide (MAXZIDE-25) 37.5-25 MG tablet Take 1 tablet by mouth daily. Patient taking differently: Take 1 tablet by mouth every morning. 10/27/20  Yes Mar Daring, PA-C  venlafaxine XR (EFFEXOR-XR) 75 MG 24 hr capsule Take 1 capsule (75 mg total) by mouth daily. Patient taking differently: Take 75 mg by mouth daily with breakfast. 10/27/20  Yes Burnette, Clearnce Sorrel, PA-C  acetaminophen (TYLENOL) 500 MG tablet Take 2 tablets (1,000 mg total) by mouth every 8 (eight) hours. 07/30/21 07/30/22  Leim Fabry, MD  betamethasone dipropionate 0.05 % cream Apply 1 application topically daily as needed (psoriasis).    [provider]  hydrOXYzine (ATARAX/VISTARIL) 10 MG tablet Take 10 mg by mouth 2 (two) times daily. 05/10/21   [provider]  Magnesium Oxide 400 MG CAPS Take 1 capsule (400 mg total) by mouth daily. Patient not taking: Reported on 07/20/2021 05/26/21   Loletha Grayer, MD  meclizine (ANTIVERT) 25 MG tablet Take 25 mg by mouth 3 (three) times daily as needed for dizziness.    [provider]  ondansetron (ZOFRAN) 4 MG tablet Take 1 tablet (4 mg total) by mouth every 8 (eight) hours as needed for up to 10 doses for nausea or vomiting. Patient not taking: Reported on 07/20/2021 05/23/21   Lucrezia Starch, MD  ondansetron (ZOFRAN-ODT) 4 MG disintegrating tablet Take 1 tablet (4 mg total) by mouth every 8 (eight) hours as needed for nausea or vomiting. 07/30/21   Leim Fabry, MD  oxyCODONE (ROXICODONE) 5 MG immediate release tablet Take 1-2 tablets (5-10 mg total)  by mouth every 4 (four) hours as needed (pain). 07/30/21 07/30/22  Leim Fabry, MD  potassium chloride SA (KLOR-CON) 20 MEQ tablet Take 1 tablet (20 mEq total) by mouth daily for 3 days. Patient not taking: Reported on 07/20/2021 05/26/21   Loletha Grayer, MD  pregabalin (LYRICA) 25 MG capsule Take 50 mg by mouth 2 (two) times daily. Patient not taking: Reported on 07/30/2021 03/23/21   [provider]    Family History Family History  Problem Relation Age of Onset   Osteoarthritis Mother    Lung cancer Father    Diabetes Brother    Breast cancer Neg Hx     Social History Social History   Tobacco Use  Smoking status: Never   Smokeless tobacco: Never  Vaping Use   Vaping Use: Never used  Substance Use Topics   Alcohol use: No   Drug use: No     Allergies   Patient has no known allergies.   Review of Systems Review of Systems  As stated above in HPI Physical Exam Triage Vital Signs ED Triage Vitals  Enc Vitals Group     BP 09/05/21 0849 (!) 156/95     Pulse Rate 09/05/21 0849 83     Resp 09/05/21 0849 14     Temp 09/05/21 0849 98.1 F (36.7 C)     Temp Source 09/05/21 0849 Oral     SpO2 09/05/21 0849 100 %     Weight 09/05/21 0846 180 lb (81.6 kg)     Height 09/05/21 0846 5\' 6"  (1.676 m)     Head Circumference --      Peak Flow --      Pain Score 09/05/21 0846 3     Pain Loc --      Pain Edu? --      Excl. in [place]? --    No data found.  Updated Vital Signs BP (!) 156/95 (BP Location: Left Arm)    Pulse 83    Temp 98.1 F (36.7 C) (Oral)    Resp 14    Ht 5\' 6"  (1.676 m)    Wt 180 lb (81.6 kg)    SpO2 100%    BMI 29.05 kg/m   Physical Exam Vitals and nursing note reviewed.  Constitutional:      General: She is not in acute distress.    Appearance: Normal appearance. She is not ill-appearing, toxic-appearing or diaphoretic.     Comments: Dry wheeze like cough  HENT:     Head: Normocephalic and atraumatic.     Right Ear: Tympanic membrane  normal.     Left Ear: Tympanic membrane normal.     Nose: Congestion (Maxillary and frontal sinus bogginess) and rhinorrhea present.     Mouth/Throat:     Mouth: Mucous membranes are moist.     Pharynx: Oropharynx is clear. No oropharyngeal exudate or posterior oropharyngeal erythema.  Eyes:     General:        Right eye: No discharge.        Left eye: No discharge.     Extraocular Movements: Extraocular movements intact.     Conjunctiva/sclera: Conjunctivae normal.     Pupils: Pupils are equal, round, and reactive to light.  Cardiovascular:     Rate and Rhythm: Normal rate and regular rhythm.     Heart sounds: Normal heart sounds.  Pulmonary:     Effort: Pulmonary effort is normal. No respiratory distress.     Breath sounds: Normal breath sounds. No stridor. No wheezing or rhonchi.  Abdominal:     Palpations: Abdomen is soft.  Musculoskeletal:     Cervical back: Normal range of motion and neck supple.  Lymphadenopathy:     Cervical: No cervical adenopathy.  Skin:    General: Skin is warm.     Coloration: Skin is not jaundiced.     Findings: No erythema or rash.  Neurological:     Mental Status: She is alert and oriented to person, place, and time.     UC Treatments / Results  Labs (all labs ordered are listed, but only abnormal results are displayed) Labs Reviewed - No data to display  EKG   Radiology No results  found.  Procedures Procedures (including critical care time)  Medications Ordered in UC Medications - No data to display  Initial Impression / Assessment and Plan / UC Course  I have reviewed the triage vital signs and the nursing notes.  Pertinent labs & imaging results that were available during my care of the patient were reviewed by me and considered in my medical decision making (see chart for details).     New. Cough and sinusitis.  Treating with Augmentin, Tessalon and Flonase.  Discussed with patient.  Follow-up as needed. Final Clinical  Impressions(s) / UC Diagnoses   Final diagnoses:  None   Discharge Instructions   None    ED Prescriptions   None    PDMP not reviewed this encounter.   Hughie Closs, [place] 09/05/21 1555

## 2021-12-12 IMAGING — MG DIGITAL SCREENING BILAT W/ TOMO W/ CAD
6 of 10 series · 6 of 30 positions shown · non-contrast
Comparison: Previous exam(s).

CLINICAL DATA: Screening.

EXAM:
DIGITAL SCREENING BILATERAL MAMMOGRAM WITH TOMO AND CAD

[R CC synth-2D]
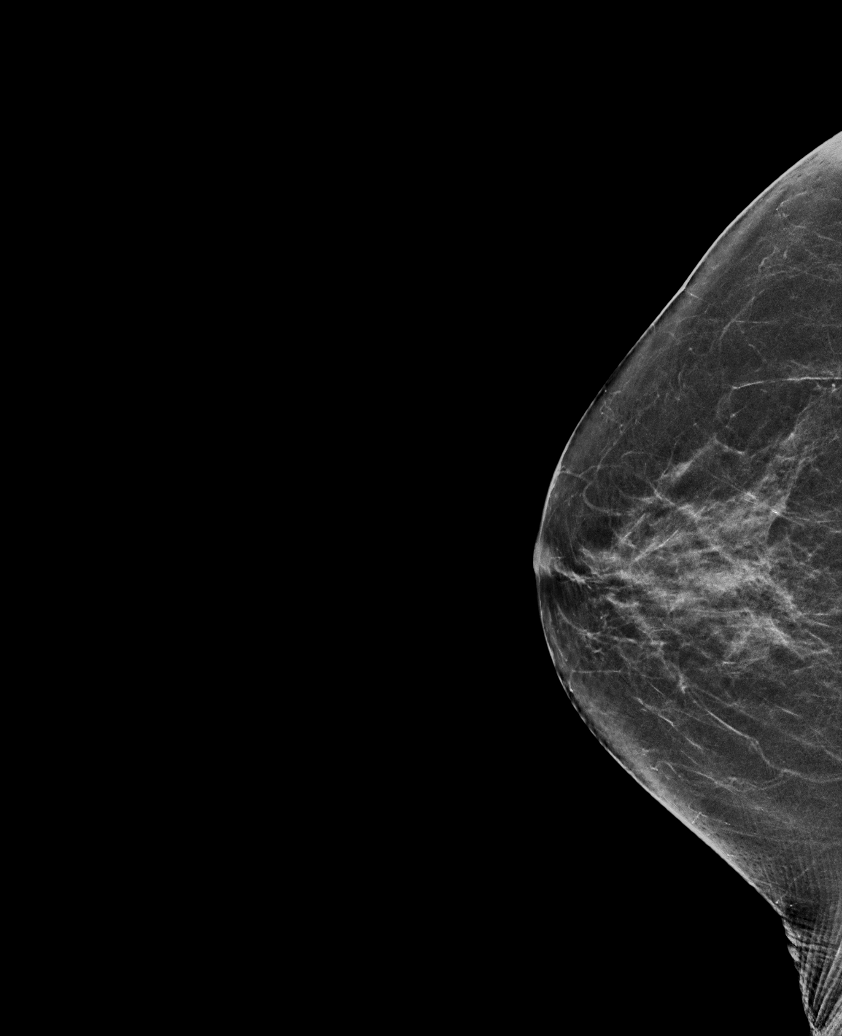

[R MLO synth-2D]
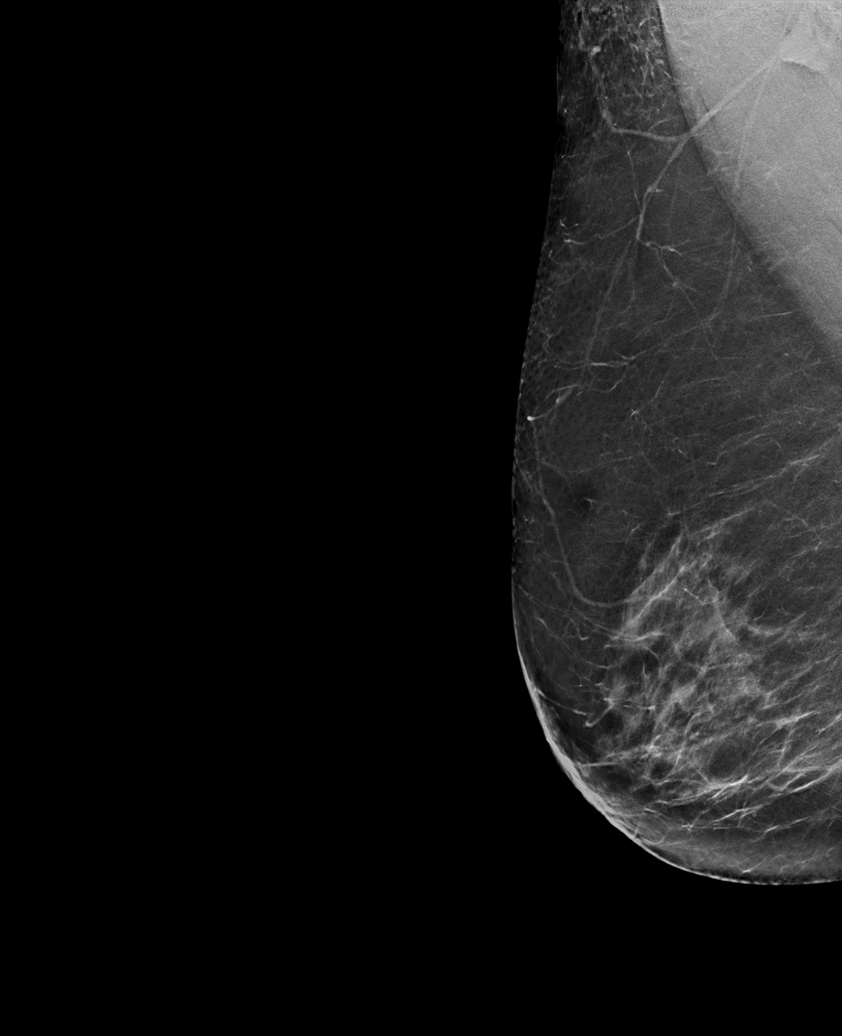

[L MLO synth-2D (1 of 2)]
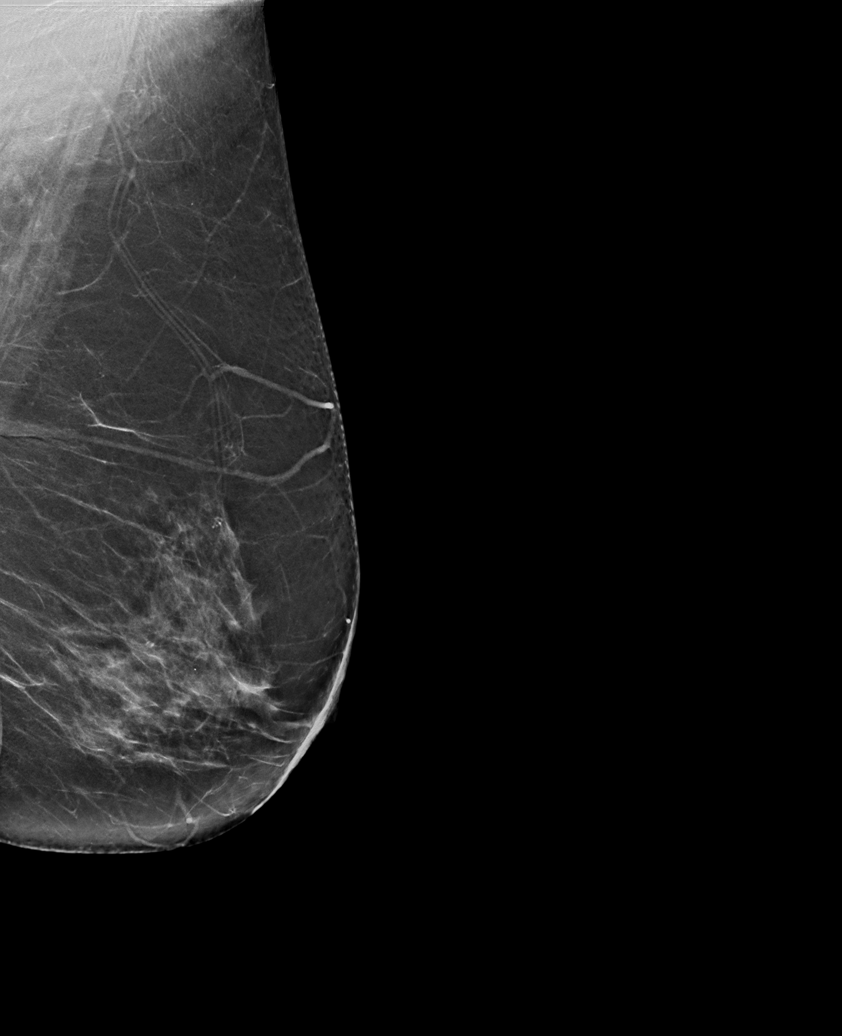

[L MLO synth-2D (2 of 2)]
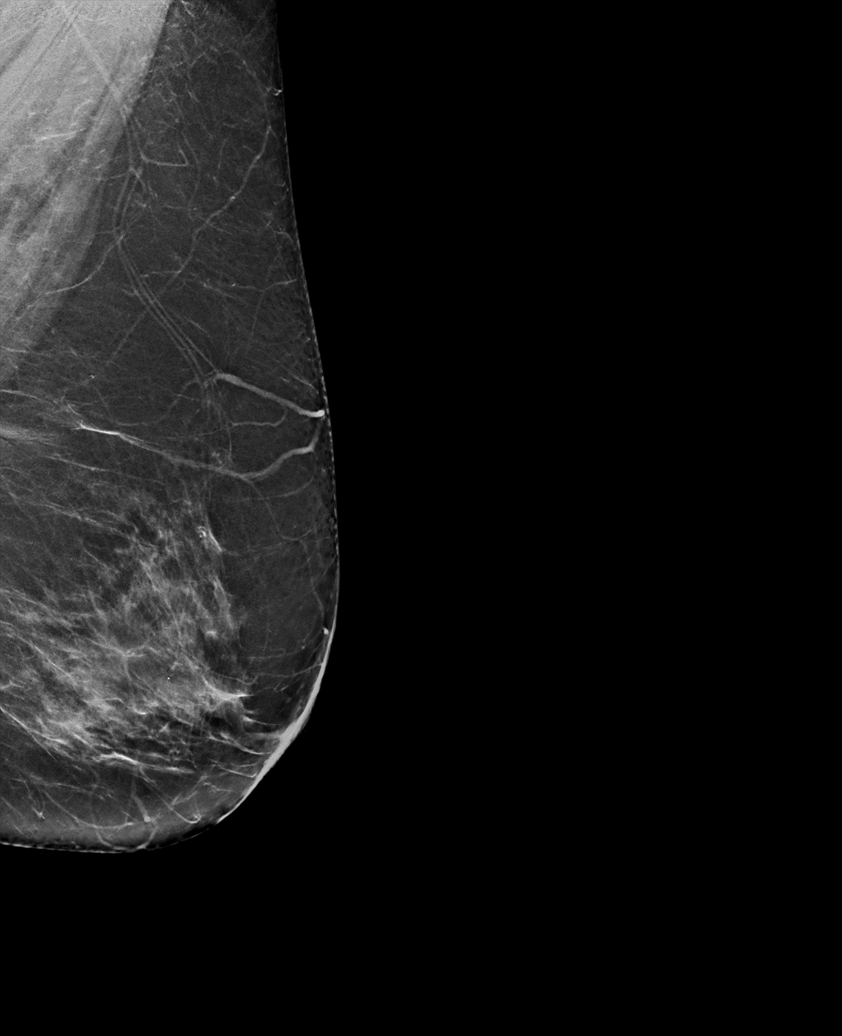

[L CC synth-2D]
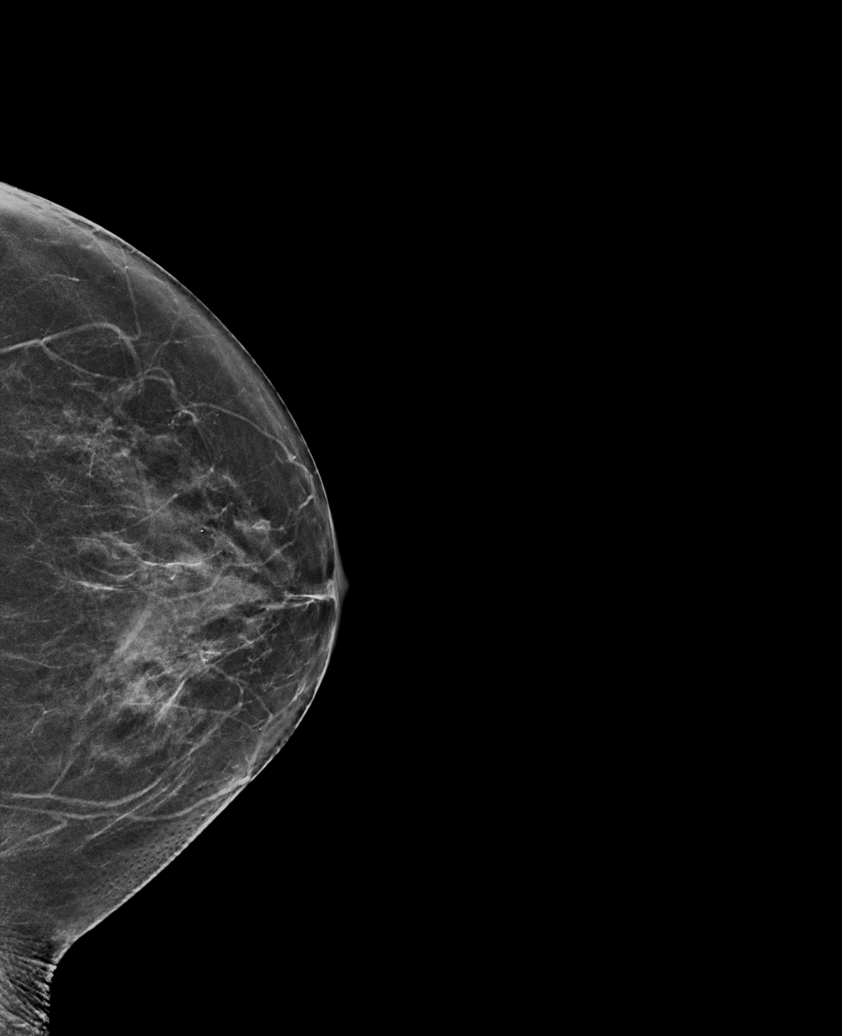

[L CC tomo · tomo slice 29/58.0]
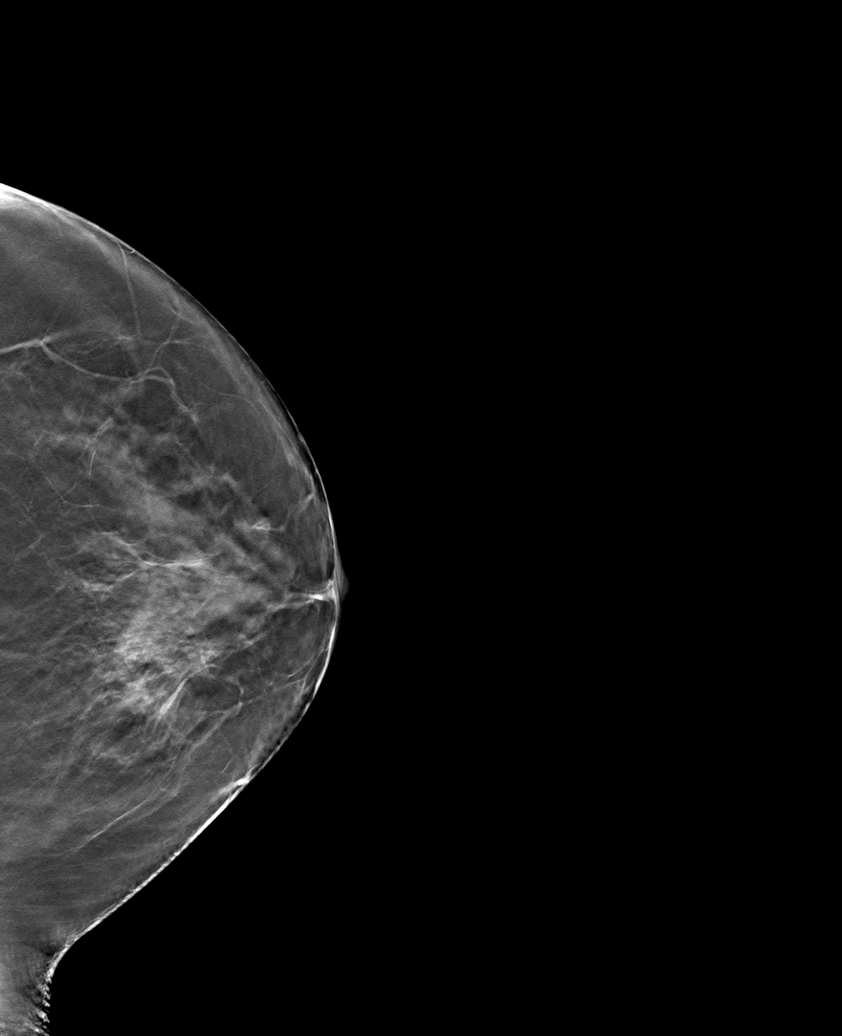

[6 of 30 positions shown; findings below may reference images not displayed]

ACR Breast Density Category b: There are scattered areas of
fibroglandular density.
FINDINGS: There are no findings suspicious for malignancy. Images were
processed with CAD.
IMPRESSION: No mammographic evidence of malignancy. A result letter of this
screening mammogram will be mailed directly to the patient.

RECOMMENDATION:
Screening mammogram in one year. (Code:[id number])

BI-RADS CATEGORY  1: Negative.

## 2021-12-28 ENCOUNTER — Other Ambulatory Visit: Payer: Self-pay | Admitting: Physician Assistant

## 2021-12-28 DIAGNOSIS — I10 Essential (primary) hypertension: Secondary | ICD-10-CM

## 2022-02-01 ENCOUNTER — Ambulatory Visit
Admission: RE | Admit: 2022-02-01 | Discharge: 2022-02-01 | Disposition: A | Discharge status: Home / Self Care | Payer: Medicare Other | Source: Ambulatory Visit | Attending: Family Medicine | Admitting: Family Medicine

## 2022-02-01 DIAGNOSIS — Z1231 Encounter for screening mammogram for malignant neoplasm of breast: Secondary | ICD-10-CM | POA: Insufficient documentation

## 2022-02-28 ENCOUNTER — Ambulatory Visit: Payer: Medicare Other | Admitting: Oncology

## 2022-02-28 ENCOUNTER — Other Ambulatory Visit: Payer: Medicare Other

## 2022-04-19 DIAGNOSIS — F329 Major depressive disorder, single episode, unspecified: Secondary | ICD-10-CM | POA: Insufficient documentation

## 2022-05-24 DIAGNOSIS — I5033 Acute on chronic diastolic (congestive) heart failure: Secondary | ICD-10-CM | POA: Insufficient documentation

## 2022-06-13 ENCOUNTER — Encounter (INDEPENDENT_AMBULATORY_CARE_PROVIDER_SITE_OTHER): Payer: Self-pay

## 2022-07-16 ENCOUNTER — Ambulatory Visit
Admission: EM | Admit: 2022-07-16 | Discharge: 2022-07-16 | Disposition: A | Discharge status: Home / Self Care | Payer: Medicare Other | Attending: Internal Medicine | Admitting: Internal Medicine

## 2022-07-16 ENCOUNTER — Ambulatory Visit (INDEPENDENT_AMBULATORY_CARE_PROVIDER_SITE_OTHER): Payer: Medicare Other

## 2022-07-16 ENCOUNTER — Encounter: Payer: Self-pay | Admitting: Emergency Medicine

## 2022-07-16 DIAGNOSIS — J189 Pneumonia, unspecified organism: Secondary | ICD-10-CM | POA: Diagnosis not present

## 2022-07-16 DIAGNOSIS — M7989 Other specified soft tissue disorders: Secondary | ICD-10-CM

## 2022-07-16 DIAGNOSIS — R059 Cough, unspecified: Secondary | ICD-10-CM | POA: Diagnosis not present

## 2022-07-16 HISTORY — DX: Heart failure, unspecified: I50.9

## 2022-07-16 MED ORDER — BENZONATATE 100 MG PO CAPS: 100.0000 mg | ORAL_CAPSULE | Freq: Three times a day (TID) | ORAL | 0 refills | Status: DC

## 2022-07-16 MED ORDER — PREDNISONE 10 MG PO TABS: ORAL_TABLET | ORAL | 0 refills | Status: DC

## 2022-07-16 MED ORDER — AZITHROMYCIN 250 MG PO TABS: ORAL_TABLET | ORAL | 0 refills | Status: DC

## 2022-07-16 NOTE — Discharge Instructions (Addendum)
You were seen today for runny nose, cough and chest congestion.  Your chest x-ray indicates that you have pneumonia.  I am treating you with antibiotics, steroids and cough tablets.  I encouraged that you get plenty of rest and fluids over the next few days.  Your toe swelling will likely improve with time.  You may want to elevate your left foot to help improve this process.  Please follow-up with your PCP if symptoms persist or worsen.

## 2022-07-16 NOTE — ED Provider Notes (Signed)
[facility]    CSN: [id number] Arrival date & time: 07/16/22  1122      History   Chief Complaint Chief Complaint  Patient presents with   Cough   Nasal Congestion    HPI Kristi Lee is a 66 y.o. female with a history of chronic back pain, HTN, GERD, insomnia, HLD presents to UC today with complaint of runny nose, cough and chest congestion.  She reports this started 2 weeks ago.  She has been clear mucus out of her nose.  The cough is mostly nonproductive but deep.  Denies headache, runny nose, nasal congestion, ear pain, sore throat, shortness of breath, chest pain, nausea, vomiting or diarrhea.  She denies fever, chills or body aches.  She has taken Mucinex and Alka-Seltzer OTC with minimal relief of symptoms.  She has not had sick contacts or exposure to COVID/flu that she is aware of.  She reports she does not smoke and has no history of asthma or COPD.  She also reports redness and swelling of her first and second toes on her left foot.  She reports this started after a fall 3 weeks ago when she fell out of the bed.  She reports these toes were initially bruised and swollen.  She reports the bruising has resolved but the swelling has not.  She has chronic numbness in her left foot from prior back surgeries.  She is not having any difficulty with her gait at this time.  She has not taken anything OTC for this.  HPI  Past Medical History:  Diagnosis Date   Arthritis    back   CHF (congestive heart failure) (HCC)    Depression    DVT (deep venous thrombosis) (HCC) 01/25/2020   GERD (gastroesophageal reflux disease)    Heart murmur    asymptomatic   Hypertension    PONV (postoperative nausea and vomiting)    Scoliosis    reports she has rods in her back    Patient Active Problem List   Diagnosis Date Noted   Vertigo    Dizziness    Nausea    AKI (acute kidney injury) ([place])    Hypomagnesemia    Hypokalemia    Iron deficiency anemia due to chronic  blood loss    Near syncope 05/25/2021   Dyspnea on exertion 08/19/2020   Special screening for malignant neoplasms, colon    Polyp of transverse colon    DVT (deep venous thrombosis) ([place]) 11/08/2019   Burning sensation 10/17/2019   Itchy scalp 10/17/2019   Occipital neuralgia of right side 10/17/2019   Tingling 10/17/2019   Hyperlipidemia, mixed 10/10/2016   Stable angina pectoris 10/10/2016   Adaptation reaction 01/27/2015   Back pain, chronic 01/27/2015   CN (constipation) 01/27/2015   Abnormal LFTs 01/27/2015   Essential hypertension 01/27/2015   Gastro-esophageal reflux disease without esophagitis 01/27/2015   Personal history of arthritis 01/27/2015   Insomnia 01/27/2015   Scoliosis 01/27/2015   FOM (frequency of micturition) 01/27/2015    Past Surgical History:  Procedure Laterality Date   ABDOMINAL HYSTERECTOMY     BACK SURGERY  10/2000   for scoliosis   BREAST EXCISIONAL BIOPSY Right 1990's   NEG   BROW LIFT Bilateral 07/23/2020   Procedure: BLEPHAROPLASTY UPPER EYELID; W/EXCESS SKIN BLEPHAROPTOSIS REPAIR; RESECT EX BILATERAL;  Surgeon: Karle Starch, MD;  Location: [place];  Service: Ophthalmology;  Laterality: Bilateral;   COLONOSCOPY WITH PROPOFOL N/A 03/23/2020   Procedure: COLONOSCOPY WITH  PROPOFOL;  Surgeon: Lucilla Lame, MD;  Location: [place];  Service: Endoscopy;  Laterality: N/A;  priority 4   FEMUR FRACTURE SURGERY Right 07/15/2009   Pertrochanteric femur fracture.   HIP FRACTURE SURGERY Right    PERIPHERAL VASCULAR THROMBECTOMY Left 11/11/2019   Procedure: PERIPHERAL VASCULAR THROMBECTOMY;  Surgeon: Algernon Huxley, MD;  Location: [facility];  Service: Cardiovascular;  Laterality: Left;   POLYPECTOMY  03/23/2020   Procedure: POLYPECTOMY;  Surgeon: Lucilla Lame, MD;  Location: [place];  Service: Endoscopy;;   REVERSE SHOULDER ARTHROPLASTY Left 07/30/2021   Procedure: Left reverse shoulder arthroplasty, biceps  tenodesis;  Surgeon: Leim Fabry, MD;  Location: [facility];  Service: Orthopedics;  Laterality: Left;   REVISION TOTAL HIP ARTHROPLASTY Left     OB History     Gravida  1   Para  1   Term      Preterm      AB      Living         SAB      IAB      Ectopic      Multiple      Live Births               Home Medications    Prior to Admission medications   Medication Sig Start Date End Date Taking? Authorizing Provider  azithromycin (ZITHROMAX) 250 MG tablet Take 2 tabs today, then 1 tab daily x 4 days 07/16/22  Yes Destaney Sarkis, Coralie Keens, NP  benzonatate (TESSALON) 100 MG capsule Take 1 capsule (100 mg total) by mouth every 8 (eight) hours. 07/16/22  Yes Meris Reede, Coralie Keens, NP  predniSONE (DELTASONE) 10 MG tablet Take 6 tabs on day 1, 5 tabs on day 2, 4 tabs on day 3, 3 tabs on day 4, 2 tabs on day 5, 1 tab on day 6 07/16/22  Yes Dorrine Montone, Coralie Keens, NP  acetaminophen (TYLENOL) 500 MG tablet Take 2 tablets (1,000 mg total) by mouth every 8 (eight) hours. 07/30/21 07/30/22  Leim Fabry, MD  amoxicillin-clavulanate (AUGMENTIN) 875-125 MG tablet Take 1 tablet by mouth every 12 (twelve) hours. 09/05/21   Hughie Closs, PA-C  atenolol (TENORMIN) 25 MG tablet Take 1 tablet (25 mg total) by mouth daily. Patient taking differently: Take 25 mg by mouth every morning. 10/27/20   Mar Daring, PA-C  betamethasone dipropionate 0.05 % cream Apply 1 application topically daily as needed (psoriasis).    [provider]  DETROL 2 MG tablet Take 1 tablet (2 mg total) by mouth 2 (two) times daily. 05/18/21   Gwyneth Sprout, FNP  fluticasone (FLONASE) 50 MCG/ACT nasal spray Place 2 sprays into both nostrils daily. 09/05/21   Hughie Closs, PA-C  hydrOXYzine (ATARAX/VISTARIL) 10 MG tablet Take 10 mg by mouth 2 (two) times daily. 05/10/21   [provider]  Magnesium Oxide 400 MG CAPS Take 1 capsule (400 mg total) by mouth daily. Patient not taking: Reported on 07/20/2021  05/26/21   Loletha Grayer, MD  meclizine (ANTIVERT) 25 MG tablet Take 25 mg by mouth 3 (three) times daily as needed for dizziness.    [provider]  omeprazole (PRILOSEC) 20 MG capsule Take 1 capsule (20 mg total) by mouth daily. Patient taking differently: Take 20 mg by mouth every morning. 10/27/20   Mar Daring, PA-C  ondansetron (ZOFRAN) 4 MG tablet Take 1 tablet (4 mg total) by mouth every 8 (eight) hours as needed  for up to 10 doses for nausea or vomiting. Patient not taking: Reported on 07/20/2021 05/23/21   Lucrezia Starch, MD  ondansetron (ZOFRAN-ODT) 4 MG disintegrating tablet Take 1 tablet (4 mg total) by mouth every 8 (eight) hours as needed for nausea or vomiting. 07/30/21   Leim Fabry, MD  oxyCODONE (ROXICODONE) 5 MG immediate release tablet Take 1-2 tablets (5-10 mg total) by mouth every 4 (four) hours as needed (pain). 07/30/21 07/30/22  Leim Fabry, MD  potassium chloride SA (KLOR-CON) 20 MEQ tablet Take 1 tablet (20 mEq total) by mouth daily for 3 days. Patient not taking: Reported on 07/20/2021 05/26/21   Loletha Grayer, MD  pregabalin (LYRICA) 25 MG capsule Take 50 mg by mouth 2 (two) times daily. Patient not taking: Reported on 07/30/2021 03/23/21   [provider]  rivaroxaban (XARELTO) 20 MG TABS tablet Take 1 tablet (20 mg total) by mouth daily with supper. 10/27/20   Mar Daring, PA-C  traZODone (DESYREL) 100 MG tablet Take 1 tablet (100 mg total) by mouth at bedtime. Patient taking differently: Take 200 mg by mouth at bedtime. 10/27/20   Mar Daring, PA-C  triamterene-hydrochlorothiazide (MAXZIDE-25) 37.5-25 MG tablet Take 1 tablet by mouth daily. Patient taking differently: Take 1 tablet by mouth every morning. 10/27/20   Mar Daring, PA-C  venlafaxine XR (EFFEXOR-XR) 75 MG 24 hr capsule Take 1 capsule (75 mg total) by mouth daily. Patient taking differently: Take 75 mg by mouth daily with breakfast. 10/27/20    Mar Daring, PA-C    Family History Family History  Problem Relation Age of Onset   Osteoarthritis Mother    Lung cancer Father    Diabetes Brother    Breast cancer Neg Hx     Social History Social History   Tobacco Use   Smoking status: Never   Smokeless tobacco: Never  Vaping Use   Vaping Use: Never used  Substance Use Topics   Alcohol use: No   Drug use: No     Allergies   Patient has no known allergies.   Review of Systems Review of Systems  Constitutional:  Negative for chills, fatigue and fever.  HENT:  Positive for rhinorrhea. Negative for congestion, ear pain, sinus pressure, sinus pain and sore throat.   Eyes:  Negative for pain and redness.  Respiratory:  Positive for cough. Negative for chest tightness and shortness of breath.   Cardiovascular:  Negative for chest pain and palpitations.  Gastrointestinal:  Negative for diarrhea, nausea and vomiting.  Musculoskeletal:  Positive for joint swelling.  Skin:  Negative for rash.  Neurological:  Positive for numbness. Negative for weakness, light-headedness and headaches.     Physical Exam Triage Vital Signs ED Triage Vitals  Enc Vitals Group     BP 07/16/22 1211 (!) 146/75     Pulse Rate 07/16/22 1211 97     Resp 07/16/22 1211 19     Temp 07/16/22 1215 98.1 F (36.7 C)     Temp Source 07/16/22 1215 Oral     SpO2 07/16/22 1211 97 %     Weight --      Height --      Head Circumference --      Peak Flow --      Pain Score 07/16/22 1209 0     Pain Loc --      Pain Edu? --      Excl. in [place]? --    No data found.  Updated Vital Signs BP (!) 146/75 (BP Location: Left Arm)   Pulse 97   Temp 98.1 F (36.7 C) (Oral)   Resp 19   SpO2 97%      Physical Exam Constitutional:      General: She is not in acute distress.    Appearance: Normal appearance. She is obese.  HENT:     Head: Normocephalic.     Comments: No sinus pressure noted    Right Ear: Tympanic membrane, ear canal and  external ear normal.     Left Ear: Tympanic membrane, ear canal and external ear normal.     Nose: Congestion present. No rhinorrhea.     Mouth/Throat:     Mouth: Mucous membranes are moist.     Pharynx: Oropharynx is clear. No oropharyngeal exudate or posterior oropharyngeal erythema.  Eyes:     Extraocular Movements: Extraocular movements intact.     Conjunctiva/sclera: Conjunctivae normal.     Pupils: Pupils are equal, round, and reactive to light.  Cardiovascular:     Rate and Rhythm: Normal rate and regular rhythm.  Pulmonary:     Effort: Pulmonary effort is normal.     Breath sounds: Wheezing and rhonchi present. No rales.  Musculoskeletal:        General: Swelling present. No tenderness or deformity.     Comments: Trace swelling of the first and second toe on the left foot.  Lymphadenopathy:     Cervical: No cervical adenopathy.  Skin:    General: Skin is warm and dry.     Findings: No rash.  Neurological:     General: No focal deficit present.     Mental Status: She is alert and oriented to person, place, and time.      UC Treatments / Results   Radiology Imaging Orders         DG Chest 2 View     IMPRESSION: Mild patchy opacity of medial right lung base, developing pneumonia is not excluded.   Medications Ordered in UC Medications - No data to display  Initial Impression / Assessment and Plan / UC Course  I have reviewed the triage vital signs and the nursing notes.  Pertinent labs & imaging results that were available during my care of the patient were reviewed by me and considered in my medical decision making (see chart for details).     66 year old female with complaint of runny nose, cough and chest congestion x2 weeks.  DDx include allergic rhinitis, viral URI with cough, upper respiratory infection, acute bronchitis, pneumonia.  No indication for COVID/flu testing given duration of symptoms and she would not qualify for antiviral therapy.  Chest  x-ray shows some patchy infiltrates on the right lung per my read, confirmed by radiology.  We will treat with Azithromycin 250 mg daily x5 days and Prednisone taper x6 days.  We will also provide Tessalon 100 mg every 8 hours as needed for cough.  Encouraged rest and fluids.  Advised her to follow-up with her PCP if symptoms persist or worsen.  She also reports swelling of the first and second toes on her left foot status post traumatic injury.  She is not having any pain or difficulty with gait.  Bruising has resolved leaving some subsequent swelling.  Reassured her that this would likely improve with time.  Encourage elevation.  No indication for imaging at this time.  Advised her to follow-up with her PCP if symptoms persist or worsen.  Final Clinical Impressions(s) /  UC Diagnoses   Final diagnoses:  Community acquired pneumonia of right lower lobe of lung  Toe swelling     Discharge Instructions      You were seen today for runny nose, cough and chest congestion.  Your chest x-ray indicates that you have pneumonia.  I am treating you with antibiotics, steroids and cough tablets.  I encouraged that you get plenty of rest and fluids over the next few days.  Your toe swelling will likely improve with time.  You may want to elevate your left foot to help improve this process.  Please follow-up with your PCP if symptoms persist or worsen.     ED Prescriptions     Medication Sig Dispense Auth. Provider   azithromycin (ZITHROMAX) 250 MG tablet Take 2 tabs today, then 1 tab daily x 4 days 6 tablet Bela Nyborg W, NP   predniSONE (DELTASONE) 10 MG tablet Take 6 tabs on day 1, 5 tabs on day 2, 4 tabs on day 3, 3 tabs on day 4, 2 tabs on day 5, 1 tab on day 6 21 tablet Carrol Hougland, Coralie Keens, NP   benzonatate (TESSALON) 100 MG capsule Take 1 capsule (100 mg total) by mouth every 8 (eight) hours. 21 capsule Jearld Fenton, NP      PDMP not reviewed this encounter.   Jearld Fenton, NP 07/16/22  [phone]

## 2022-07-16 NOTE — ED Triage Notes (Signed)
Pt c/o cough that is not productive and congestion for 2 weeks. Taking Alka seltzer plus.   Pt c/o left big toe and 2nd toe swelling and redness that has been ongoing for about 3 weeks after falling out of bed when reaching for the phone.

## 2022-12-04 ENCOUNTER — Ambulatory Visit
Admission: EM | Admit: 2022-12-04 | Discharge: 2022-12-04 | Disposition: A | Discharge status: Home / Self Care | Payer: Medicare Other | Attending: Family Medicine | Admitting: Family Medicine

## 2022-12-04 DIAGNOSIS — S0993XA Unspecified injury of face, initial encounter: Secondary | ICD-10-CM | POA: Diagnosis not present

## 2022-12-04 DIAGNOSIS — H029 Unspecified disorder of eyelid: Secondary | ICD-10-CM | POA: Diagnosis not present

## 2022-12-04 MED ORDER — CEPHALEXIN 500 MG PO CAPS: 500.0000 mg | ORAL_CAPSULE | Freq: Three times a day (TID) | ORAL | 0 refills | Status: DC

## 2022-12-04 MED ORDER — HYDROCORTISONE 1 % EX CREA: TOPICAL_CREAM | CUTANEOUS | 0 refills | Status: AC

## 2022-12-04 NOTE — Discharge Instructions (Addendum)
Stop by the pharmacy to pick up your prescriptions.  Follow up with your primary care provider as needed.  

## 2022-12-04 NOTE — ED Provider Notes (Signed)
[facility]    CSN: [id number] Arrival date & time: 12/04/22  1300      History   Chief Complaint No chief complaint on file.   HPI HPI  Kristi Lee is a 67 y.o. female.    Kristi Lee presents for right eye discomfort that started a few days ago. Yesterday, she felt a bump above her right brow and the area had a small amount of blood. She has not had a headache , fever, nausea, vomiting, joint pain or rahs. Endorses some upper facial pain. Denies blurry vision.  Kristi Lee has otherwise been well and has no additional concerns today.    Past Medical History:  Diagnosis Date   Arthritis    back   CHF (congestive heart failure)    Depression    DVT (deep venous thrombosis) 01/25/2020   GERD (gastroesophageal reflux disease)    Heart murmur    asymptomatic   Hypertension    PONV (postoperative nausea and vomiting)    Scoliosis    reports she has rods in her back    Patient Active Problem List   Diagnosis Date Noted   Vertigo    Dizziness    Nausea    AKI (acute kidney injury)    Hypomagnesemia    Hypokalemia    Iron deficiency anemia due to chronic blood loss    Near syncope 05/25/2021   Dyspnea on exertion 08/19/2020   Special screening for malignant neoplasms, colon    Polyp of transverse colon    DVT (deep venous thrombosis) 11/08/2019   Burning sensation 10/17/2019   Itchy scalp 10/17/2019   Occipital neuralgia of right side 10/17/2019   Tingling 10/17/2019   Hyperlipidemia, mixed 10/10/2016   Stable angina pectoris 10/10/2016   Adaptation reaction 01/27/2015   Back pain, chronic 01/27/2015   CN (constipation) 01/27/2015   Abnormal LFTs 01/27/2015   Essential hypertension 01/27/2015   Gastro-esophageal reflux disease without esophagitis 01/27/2015   Personal history of arthritis 01/27/2015   Insomnia 01/27/2015   Scoliosis 01/27/2015   FOM (frequency of micturition) 01/27/2015    Past Surgical History:  Procedure Laterality Date    ABDOMINAL HYSTERECTOMY     BACK SURGERY  10/2000   for scoliosis   BREAST EXCISIONAL BIOPSY Right 1990's   NEG   BROW LIFT Bilateral 07/23/2020   Procedure: BLEPHAROPLASTY UPPER EYELID; W/EXCESS SKIN BLEPHAROPTOSIS REPAIR; RESECT EX BILATERAL;  Surgeon: Imagene Riches, MD;  Location: [facility];  Service: Ophthalmology;  Laterality: Bilateral;   COLONOSCOPY WITH PROPOFOL N/A 03/23/2020   Procedure: COLONOSCOPY WITH PROPOFOL;  Surgeon: Midge Minium, MD;  Location: [facility];  Service: Endoscopy;  Laterality: N/A;  priority 4   FEMUR FRACTURE SURGERY Right 07/15/2009   Pertrochanteric femur fracture.   HIP FRACTURE SURGERY Right    PERIPHERAL VASCULAR THROMBECTOMY Left 11/11/2019   Procedure: PERIPHERAL VASCULAR THROMBECTOMY;  Surgeon: Annice Needy, MD;  Location: [facility];  Service: Cardiovascular;  Laterality: Left;   POLYPECTOMY  03/23/2020   Procedure: POLYPECTOMY;  Surgeon: Midge Minium, MD;  Location: [facility];  Service: Endoscopy;;   REVERSE SHOULDER ARTHROPLASTY Left 07/30/2021   Procedure: Left reverse shoulder arthroplasty, biceps tenodesis;  Surgeon: Signa Kell, MD;  Location: [facility];  Service: Orthopedics;  Laterality: Left;   REVISION TOTAL HIP ARTHROPLASTY Left     OB History     Gravida  1   Para  1   Term      Preterm  AB      Living         SAB      IAB      Ectopic      Multiple      Live Births               Home Medications    Prior to Admission medications   Medication Sig Start Date End Date Taking? Authorizing Provider  cephALEXin (KEFLEX) 500 MG capsule Take 1 capsule (500 mg total) by mouth 3 (three) times daily. 12/04/22  Yes Kimberely Mccannon, Seward Meth, DO  hydrocortisone cream 1 % Apply to affected area 2 times daily 12/04/22  Yes Edita Weyenberg, DO  atenolol (TENORMIN) 25 MG tablet Take 1 tablet (25 mg total) by mouth daily. Patient taking differently: Take 25 mg by mouth every morning.  10/27/20   Margaretann Loveless, PA-C  azithromycin (ZITHROMAX) 250 MG tablet Take 2 tabs today, then 1 tab daily x 4 days 07/16/22   Lorre Munroe, NP  betamethasone dipropionate 0.05 % cream Apply 1 application topically daily as needed (psoriasis).    [provider]  DETROL 2 MG tablet Take 1 tablet (2 mg total) by mouth 2 (two) times daily. 05/18/21   Jacky Kindle, FNP  Magnesium Oxide 400 MG CAPS Take 1 capsule (400 mg total) by mouth daily. Patient not taking: Reported on 07/20/2021 05/26/21   Alford Highland, MD  meclizine (ANTIVERT) 25 MG tablet Take 25 mg by mouth 3 (three) times daily as needed for dizziness.    [provider]  omeprazole (PRILOSEC) 20 MG capsule Take 1 capsule (20 mg total) by mouth daily. Patient taking differently: Take 20 mg by mouth every morning. 10/27/20   Margaretann Loveless, PA-C  ondansetron (ZOFRAN-ODT) 4 MG disintegrating tablet Take 1 tablet (4 mg total) by mouth every 8 (eight) hours as needed for nausea or vomiting. 07/30/21   Signa Kell, MD  potassium chloride SA (KLOR-CON) 20 MEQ tablet Take 1 tablet (20 mEq total) by mouth daily for 3 days. Patient not taking: Reported on 07/20/2021 05/26/21   Alford Highland, MD  rivaroxaban (XARELTO) 20 MG TABS tablet Take 1 tablet (20 mg total) by mouth daily with supper. 10/27/20   Margaretann Loveless, PA-C  traZODone (DESYREL) 100 MG tablet Take 1 tablet (100 mg total) by mouth at bedtime. Patient taking differently: Take 200 mg by mouth at bedtime. 10/27/20   Margaretann Loveless, PA-C  triamterene-hydrochlorothiazide (MAXZIDE-25) 37.5-25 MG tablet Take 1 tablet by mouth daily. Patient taking differently: Take 1 tablet by mouth every morning. 10/27/20   Margaretann Loveless, PA-C  venlafaxine XR (EFFEXOR-XR) 75 MG 24 hr capsule Take 1 capsule (75 mg total) by mouth daily. Patient taking differently: Take 75 mg by mouth daily with breakfast. 10/27/20   Margaretann Loveless, PA-C    Family  History Family History  Problem Relation Age of Onset   Osteoarthritis Mother    Lung cancer Father    Diabetes Brother    Breast cancer Neg Hx     Social History Social History   Tobacco Use   Smoking status: Never   Smokeless tobacco: Never  Vaping Use   Vaping Use: Never used  Substance Use Topics   Alcohol use: No   Drug use: No     Allergies   Patient has no known allergies.   Review of Systems Review of Systems : negative unless otherwise stated in HPI.  Physical Exam Triage Vital Signs ED Triage Vitals  Enc Vitals Group     BP 12/04/22 1321 (!) 146/84     Pulse Rate 12/04/22 1321 75     Resp 12/04/22 1321 17     Temp 12/04/22 1321 98.4 F (36.9 C)     Temp Source 12/04/22 1321 Oral     SpO2 12/04/22 1321 96 %     Weight 12/04/22 1323 170 lb (77.1 kg)     Height 12/04/22 1323  (1.676 m)     Head Circumference --      Peak Flow --      Pain Score 12/04/22 1323 5     Pain Loc --      Pain Edu? --      Excl. in [place]? --    No data found.  Updated Vital Signs BP (!) 146/84 (BP Location: Left Arm)   Pulse 75   Temp 98.4 F (36.9 C) (Oral)   Resp 17   Ht  (1.676 m)   Wt 77.1 kg   SpO2 96%   BMI 27.44 kg/m   Visual Acuity Right Eye Distance:   Left Eye Distance:   Bilateral Distance:    Right Eye Near:   Left Eye Near:    Bilateral Near:     Physical Exam  GEN: pleasant well appearing female, in no acute distress  NECK: normal ROM  CV: regular rate  RESP: no increased work of breathing EYES:     General: right upper lid edema,  Vision grossly intact. Gaze aligned appropriately.        Right eye: No discharge, chemosis or hordeolum, right brow with indurated area with overlying redness and abrasion.         Left eye: No foreign body, discharge or hordeolum.     Extraocular Movements: Extraocular movements intact.     PERRLA SKIN: warm and dry   UC Treatments / Results  Labs (all labs ordered are listed, but only  abnormal results are displayed) Labs Reviewed - No data to display  EKG   Radiology No results found.  Procedures Procedures (including critical care time)  Medications Ordered in UC Medications - No data to display  Initial Impression / Assessment and Plan / UC Course  I have reviewed the triage vital signs and the nursing notes.  Pertinent labs & imaging results that were available during my care of the patient were reviewed by me and considered in my medical decision making (see chart for details).     Patient is a 67 y.o. female who presents for right eyebrow discomfort.On exam, she has concerning signs for possible underlying infection.. Treat with topical hydrocortisone for itching and keflex 500 mg TID for 7 days.   ED precautions given and she voiced understanding voiced. Discussed MDM, treatment plan and plan for follow-up with patient who agrees with plan.  Final Clinical Impressions(s) / UC Diagnoses   Final diagnoses:  Injury of eyebrow, initial encounter  Eyelid abnormality     Discharge Instructions      Stop by the pharmacy to pick up your prescriptions.  Follow up with your primary care provider as needed.      ED Prescriptions     Medication Sig Dispense Auth. Provider   cephALEXin (KEFLEX) 500 MG capsule Take 1 capsule (500 mg total) by mouth 3 (three) times daily. 21 capsule Ikram Riebe, DO   hydrocortisone cream 1 % Apply to affected area 2 times  daily 15 g Katha Cabal, DO      PDMP not reviewed this encounter.   Katha Cabal, DO 12/04/22 1403

## 2022-12-04 NOTE — ED Triage Notes (Signed)
Patient presents with "spot above right eye eyebrow area, itching and swollen also spot in head that itches and burns, noticed it this morning."  Pt states it is not affecting vision.

## 2023-03-27 ENCOUNTER — Telehealth (INDEPENDENT_AMBULATORY_CARE_PROVIDER_SITE_OTHER): Payer: Self-pay

## 2023-03-27 ENCOUNTER — Other Ambulatory Visit: Payer: Self-pay | Admitting: Family Medicine

## 2023-03-27 DIAGNOSIS — Z1231 Encounter for screening mammogram for malignant neoplasm of breast: Secondary | ICD-10-CM

## 2023-03-27 NOTE — Telephone Encounter (Signed)
Based on Dr. Driscilla Grammes last note she has a clotting disorder called Factor V Leiden and based on this she needs to stay on xarelto for a lifetime.  She shouldn't need an appointment unless she has additional questions

## 2023-03-27 NOTE — Telephone Encounter (Signed)
I spoke to pt and she states understanding of staying on the Xarelto

## 2023-04-03 ENCOUNTER — Other Ambulatory Visit: Payer: Self-pay

## 2023-04-03 ENCOUNTER — Inpatient Hospital Stay
Admission: EM | Admit: 2023-04-03 | Discharge: 2023-04-10 | DRG: 537 | Disposition: A | Discharge status: Skilled Nursing Facility | Payer: Medicare Other | Attending: Osteopathic Medicine | Admitting: Osteopathic Medicine

## 2023-04-03 ENCOUNTER — Emergency Department: Payer: Medicare Other

## 2023-04-03 DIAGNOSIS — I110 Hypertensive heart disease with heart failure: Secondary | ICD-10-CM | POA: Diagnosis present

## 2023-04-03 DIAGNOSIS — N3000 Acute cystitis without hematuria: Secondary | ICD-10-CM

## 2023-04-03 DIAGNOSIS — R7989 Other specified abnormal findings of blood chemistry: Secondary | ICD-10-CM | POA: Diagnosis present

## 2023-04-03 DIAGNOSIS — D6851 Activated protein C resistance: Secondary | ICD-10-CM | POA: Diagnosis present

## 2023-04-03 DIAGNOSIS — M25552 Pain in left hip: Secondary | ICD-10-CM | POA: Diagnosis not present

## 2023-04-03 DIAGNOSIS — R000 Tachycardia, unspecified: Secondary | ICD-10-CM | POA: Diagnosis not present

## 2023-04-03 DIAGNOSIS — Z96643 Presence of artificial hip joint, bilateral: Secondary | ICD-10-CM | POA: Diagnosis present

## 2023-04-03 DIAGNOSIS — F32A Depression, unspecified: Secondary | ICD-10-CM | POA: Diagnosis present

## 2023-04-03 DIAGNOSIS — I5032 Chronic diastolic (congestive) heart failure: Secondary | ICD-10-CM | POA: Diagnosis present

## 2023-04-03 DIAGNOSIS — Z86718 Personal history of other venous thrombosis and embolism: Secondary | ICD-10-CM

## 2023-04-03 DIAGNOSIS — K219 Gastro-esophageal reflux disease without esophagitis: Secondary | ICD-10-CM | POA: Diagnosis present

## 2023-04-03 DIAGNOSIS — S76012A Strain of muscle, fascia and tendon of left hip, initial encounter: Secondary | ICD-10-CM | POA: Diagnosis not present

## 2023-04-03 DIAGNOSIS — Z833 Family history of diabetes mellitus: Secondary | ICD-10-CM

## 2023-04-03 DIAGNOSIS — R109 Unspecified abdominal pain: Secondary | ICD-10-CM | POA: Diagnosis present

## 2023-04-03 DIAGNOSIS — N390 Urinary tract infection, site not specified: Secondary | ICD-10-CM | POA: Diagnosis present

## 2023-04-03 DIAGNOSIS — I10 Essential (primary) hypertension: Secondary | ICD-10-CM

## 2023-04-03 DIAGNOSIS — I82409 Acute embolism and thrombosis of unspecified deep veins of unspecified lower extremity: Secondary | ICD-10-CM | POA: Diagnosis present

## 2023-04-03 DIAGNOSIS — Z1152 Encounter for screening for COVID-19: Secondary | ICD-10-CM

## 2023-04-03 DIAGNOSIS — R1032 Left lower quadrant pain: Secondary | ICD-10-CM | POA: Diagnosis present

## 2023-04-03 DIAGNOSIS — M419 Scoliosis, unspecified: Secondary | ICD-10-CM | POA: Diagnosis present

## 2023-04-03 DIAGNOSIS — E861 Hypovolemia: Secondary | ICD-10-CM | POA: Diagnosis present

## 2023-04-03 DIAGNOSIS — Z90710 Acquired absence of both cervix and uterus: Secondary | ICD-10-CM

## 2023-04-03 DIAGNOSIS — R937 Abnormal findings on diagnostic imaging of other parts of musculoskeletal system: Secondary | ICD-10-CM | POA: Diagnosis present

## 2023-04-03 DIAGNOSIS — D500 Iron deficiency anemia secondary to blood loss (chronic): Secondary | ICD-10-CM | POA: Diagnosis present

## 2023-04-03 DIAGNOSIS — X58XXXA Exposure to other specified factors, initial encounter: Secondary | ICD-10-CM | POA: Diagnosis present

## 2023-04-03 DIAGNOSIS — E871 Hypo-osmolality and hyponatremia: Secondary | ICD-10-CM | POA: Diagnosis present

## 2023-04-03 DIAGNOSIS — E876 Hypokalemia: Secondary | ICD-10-CM | POA: Diagnosis present

## 2023-04-03 DIAGNOSIS — F5101 Primary insomnia: Secondary | ICD-10-CM

## 2023-04-03 DIAGNOSIS — Z79899 Other long term (current) drug therapy: Secondary | ICD-10-CM

## 2023-04-03 DIAGNOSIS — Z7901 Long term (current) use of anticoagulants: Secondary | ICD-10-CM

## 2023-04-03 DIAGNOSIS — Z96612 Presence of left artificial shoulder joint: Secondary | ICD-10-CM | POA: Diagnosis present

## 2023-04-03 DIAGNOSIS — Z801 Family history of malignant neoplasm of trachea, bronchus and lung: Secondary | ICD-10-CM

## 2023-04-03 DIAGNOSIS — N179 Acute kidney failure, unspecified: Secondary | ICD-10-CM | POA: Diagnosis present

## 2023-04-03 DIAGNOSIS — Z981 Arthrodesis status: Secondary | ICD-10-CM

## 2023-04-03 DIAGNOSIS — M479 Spondylosis, unspecified: Secondary | ICD-10-CM | POA: Diagnosis present

## 2023-04-03 LAB — CBC WITH DIFFERENTIAL/PLATELET
Abs Immature Granulocytes: 0.27 10*3/uL — ABNORMAL HIGH (ref 0.00–0.07)
Basophils Absolute: 0.1 10*3/uL (ref 0.0–0.1)
Basophils Relative: 1 %
Eosinophils Absolute: 0 10*3/uL (ref 0.0–0.5)
Eosinophils Relative: 0 %
HCT: 41.9 % (ref 36.0–46.0)
Hemoglobin: 13.5 g/dL (ref 12.0–15.0)
Immature Granulocytes: 2 %
Lymphocytes Relative: 5 %
Lymphs Abs: 0.8 10*3/uL (ref 0.7–4.0)
MCH: 28.2 pg (ref 26.0–34.0)
MCHC: 32.2 g/dL (ref 30.0–36.0)
MCV: 87.5 fL (ref 80.0–100.0)
Monocytes Absolute: 1.8 10*3/uL — ABNORMAL HIGH (ref 0.1–1.0)
Monocytes Relative: 10 %
Neutro Abs: 14.2 10*3/uL — ABNORMAL HIGH (ref 1.7–7.7)
Neutrophils Relative %: 82 %
Platelets: 178 10*3/uL (ref 150–400)
RBC: 4.79 MIL/uL (ref 3.87–5.11)
RDW: 15.7 % — ABNORMAL HIGH (ref 11.5–15.5)
WBC: 17.1 10*3/uL — ABNORMAL HIGH (ref 4.0–10.5)
nRBC: 0 % (ref 0.0–0.2)

## 2023-04-03 LAB — COMPREHENSIVE METABOLIC PANEL
ALT: 26 U/L (ref 0–44)
AST: 29 U/L (ref 15–41)
Albumin: 4 g/dL (ref 3.5–5.0)
Alkaline Phosphatase: 143 U/L — ABNORMAL HIGH (ref 38–126)
Anion gap: 12 (ref 5–15)
BUN: 23 mg/dL (ref 8–23)
CO2: 24 mmol/L (ref 22–32)
Calcium: 8.9 mg/dL (ref 8.9–10.3)
Chloride: 96 mmol/L — ABNORMAL LOW (ref 98–111)
Creatinine, Ser: 1.23 mg/dL — ABNORMAL HIGH (ref 0.44–1.00)
GFR, Estimated: 48 mL/min — ABNORMAL LOW (ref >60)
Glucose, Bld: 141 mg/dL — ABNORMAL HIGH (ref 70–99)
Potassium: 3.1 mmol/L — ABNORMAL LOW (ref 3.5–5.1)
Sodium: 132 mmol/L — ABNORMAL LOW (ref 135–145)
Total Bilirubin: 1 mg/dL (ref 0.3–1.2)
Total Protein: 7.3 g/dL (ref 6.5–8.1)

## 2023-04-03 LAB — URINALYSIS, ROUTINE W REFLEX MICROSCOPIC
Bilirubin Urine: NEGATIVE
Glucose, UA: NEGATIVE mg/dL
Hgb urine dipstick: NEGATIVE
Ketones, ur: NEGATIVE mg/dL
Nitrite: NEGATIVE
Protein, ur: 30 mg/dL — AB
Specific Gravity, Urine: 1.016 (ref 1.005–1.030)
WBC, UA: >50 WBC/hpf (ref 0–5)
pH: 5 (ref 5.0–8.0)

## 2023-04-03 MED ORDER — FENTANYL CITRATE PF 50 MCG/ML IJ SOSY
100.0000 ug | PREFILLED_SYRINGE | Freq: Once | INTRAMUSCULAR | Status: AC
  Administered 2023-04-03: 100 ug via INTRAMUSCULAR
  Filled 2023-04-03: qty 2

## 2023-04-03 MED ORDER — ONDANSETRON 8 MG PO TBDP
8.0000 mg | ORAL_TABLET | Freq: Once | ORAL | Status: AC
  Administered 2023-04-03: 8 mg via ORAL
  Filled 2023-04-03: qty 1

## 2023-04-03 NOTE — ED Notes (Signed)
Pt to CT

## 2023-04-03 NOTE — ED Triage Notes (Signed)
Pt states she has had left side groin pain since this morning  pt states pain is worse when she moves her left leg. Denies any known injury. Pt denies any urinary symptoms.

## 2023-04-03 NOTE — ED Provider Notes (Incomplete)
[facility] Provider Note  Patient Contact: 10:21 PM (approximate)   History   Groin Pain   HPI  Kristi Lee is a 67 y.o. female who presents to the emergency department complaining of sharp pain to the left hip and left groin region.  Patient states that she went to bed, felt okay, woke up this morning with excruciating left hip and groin pain.  She has a significant back and hip history having had multiple back surgeries with severe scoliosis and multilevel fusions.  Patient also has had bilateral hip replacements with the right hip being replaced twice secondary to her fracture.  Patient has no reported trauma.  She denies any abdominal pain, urinary symptoms.  There is no radiation of symptoms down her leg.  No bowel or bladder function, saddle anesthesia or paresthesias.  Patient states that it feels like it is more along the anterior groin region.  Denies dysuria, polyuria, hematuria.     Physical Exam   Triage Vital Signs: ED Triage Vitals  Encounter Vitals Group     BP 04/03/23 1936 127/71     Systolic BP Percentile --      Diastolic BP Percentile --      Pulse Rate 04/03/23 1936 (!) 103     Resp 04/03/23 1936 18     Temp 04/03/23 1936 99.6 F (37.6 C)     Temp Source 04/03/23 1936 Oral     SpO2 04/03/23 1936 99 %     Weight 04/03/23 1934 165 lb (74.8 kg)     Height 04/03/23 1934 5\' 6"  (1.676 m)     Head Circumference --      Peak Flow --      Pain Score 04/03/23 1934 10     Pain Loc --      Pain Education --      Exclude from Growth Chart --     Most recent vital signs: Vitals:   04/03/23 1936  BP: 127/71  Pulse: (!) 103  Resp: 18  Temp: 99.6 F (37.6 C)  SpO2: 99%     General: Alert and in no acute distress.   Cardiovascular:  Good peripheral perfusion Respiratory: Normal respiratory effort without tachypnea or retractions. Lungs CTAB. Good air entry to the bases with no decreased or absent breath  sounds. Gastrointestinal: Bowel sounds 4 quadrants. Soft and nontender to palpation. No guarding or rigidity. No palpable masses. No distention. No CVA tenderness. Musculoskeletal: Limited range of motion to left lower extremity currently secondary to pain.  Exquisite tenderness along the left inguinal crease extending to the left lateral hip.  No palpable abnormality.  There is no shortening or rotation of the left lower extremity.  No tenderness over the right side.  No tenderness over the lumbar spine or SI joints or sciatic notch.  Pulses intact bilateral lower extremity.  Sensation intact and equal bilateral lower extremity. Neurologic:  No gross focal neurologic deficits are appreciated.  Skin:   No rash noted Other:   ED Results / Procedures / Treatments   Labs (all labs ordered are listed, but only abnormal results are displayed) Labs Reviewed  CBC WITH DIFFERENTIAL/PLATELET - Abnormal; Notable for the following components:      Result Value   WBC 17.1 (*)    RDW 15.7 (*)    Neutro Abs 14.2 (*)    Monocytes Absolute 1.8 (*)    Abs Immature Granulocytes 0.27 (*)    All other components within normal  limits  COMPREHENSIVE METABOLIC PANEL - Abnormal; Notable for the following components:   Sodium 132 (*)    Potassium 3.1 (*)    Chloride 96 (*)    Glucose, Bld 141 (*)    Creatinine, Ser 1.23 (*)    Alkaline Phosphatase 143 (*)    GFR, Estimated 48 (*)    All other components within normal limits  URINALYSIS, ROUTINE W REFLEX MICROSCOPIC - Abnormal; Notable for the following components:   Color, Urine YELLOW (*)    APPearance CLOUDY (*)    Protein, ur 30 (*)    Leukocytes,Ua LARGE (*)    Bacteria, UA MANY (*)    All other components within normal limits     EKG     RADIOLOGY  I personally viewed, evaluated, and interpreted these images as part of my medical decision making, as well as reviewing the written report by the radiologist.  ED Provider Interpretation:    No results found.  PROCEDURES:  Critical Care performed: {CriticalCareYesNo:19197::"Yes, see critical care procedure note(s)","No"}  Procedures   MEDICATIONS ORDERED IN ED: Medications - No data to display   IMPRESSION / MDM / ASSESSMENT AND PLAN / ED COURSE  I reviewed the triage vital signs and the nursing notes.                                 Differential diagnosis includes, but is not limited to, ***  {**The patient is on the cardiac monitor to evaluate for evidence of arrhythmia and/or significant heart rate changes.**}  Patient's presentation is most consistent with {EM COPA:27473}   Patient's diagnosis is consistent with ***. Patient will be discharged home with prescriptions for ***. Patient is to follow up with *** as needed or otherwise directed. Patient is given ED precautions to return to the ED for any worsening or new symptoms.     FINAL CLINICAL IMPRESSION(S) / ED DIAGNOSES   Final diagnoses:  None     Rx / DC Orders   ED Discharge Orders     None        Note:  This document was prepared using Dragon voice recognition software and may include unintentional dictation errors.

## 2023-04-03 NOTE — ED Provider Notes (Signed)
[facility] Provider Note  Patient Contact: 10:21 PM (approximate)   History   Groin Pain   HPI  Kristi Lee is a 67 y.o. female who presents to the emergency department complaining of sharp pain to the left hip and left groin region.  Patient states that she went to bed, felt okay, woke up this morning with excruciating left hip and groin pain.  She has a significant back and hip history having had multiple back surgeries with severe scoliosis and multilevel fusions.  Patient also has had bilateral hip replacements with the right hip being replaced twice secondary to her fracture.  Patient has no reported trauma.  She denies any abdominal pain, urinary symptoms.  There is no radiation of symptoms down her leg.  No bowel or bladder function, saddle anesthesia or paresthesias.  Patient states that it feels like it is more along the anterior groin region.  Denies dysuria, polyuria, hematuria.     Physical Exam   Triage Vital Signs: ED Triage Vitals  Encounter Vitals Group     BP 04/03/23 1936 127/71     Systolic BP Percentile --      Diastolic BP Percentile --      Pulse Rate 04/03/23 1936 (!) 103     Resp 04/03/23 1936 18     Temp 04/03/23 1936 99.6 F (37.6 C)     Temp Source 04/03/23 1936 Oral     SpO2 04/03/23 1936 99 %     Weight 04/03/23 1934 165 lb (74.8 kg)     Height 04/03/23 1934 5\' 6"  (1.676 m)     Head Circumference --      Peak Flow --      Pain Score 04/03/23 1934 10     Pain Loc --      Pain Education --      Exclude from Growth Chart --     Most recent vital signs: Vitals:   04/03/23 1936  BP: 127/71  Pulse: (!) 103  Resp: 18  Temp: 99.6 F (37.6 C)  SpO2: 99%     General: Alert and in no acute distress.   Cardiovascular:  Good peripheral perfusion Respiratory: Normal respiratory effort without tachypnea or retractions. Lungs CTAB. Good air entry to the bases with no decreased or absent breath  sounds. Gastrointestinal: Bowel sounds 4 quadrants. Soft and nontender to palpation. No guarding or rigidity. No palpable masses. No distention. No CVA tenderness. Musculoskeletal: Limited range of motion to left lower extremity currently secondary to pain.  Exquisite tenderness along the left inguinal crease extending to the left lateral hip.  No palpable abnormality.  There is no shortening or rotation of the left lower extremity.  No tenderness over the right side.  No tenderness over the lumbar spine or SI joints or sciatic notch.  Pulses intact bilateral lower extremity.  Sensation intact and equal bilateral lower extremity. Neurologic:  No gross focal neurologic deficits are appreciated.  Skin:   No rash noted Other:   ED Results / Procedures / Treatments   Labs (all labs ordered are listed, but only abnormal results are displayed) Labs Reviewed  CBC WITH DIFFERENTIAL/PLATELET - Abnormal; Notable for the following components:      Result Value   WBC 17.1 (*)    RDW 15.7 (*)    Neutro Abs 14.2 (*)    Monocytes Absolute 1.8 (*)    Abs Immature Granulocytes 0.27 (*)    All other components within normal  limits  COMPREHENSIVE METABOLIC PANEL - Abnormal; Notable for the following components:   Sodium 132 (*)    Potassium 3.1 (*)    Chloride 96 (*)    Glucose, Bld 141 (*)    Creatinine, Ser 1.23 (*)    Alkaline Phosphatase 143 (*)    GFR, Estimated 48 (*)    All other components within normal limits  URINALYSIS, ROUTINE W REFLEX MICROSCOPIC - Abnormal; Notable for the following components:   Color, Urine YELLOW (*)    APPearance CLOUDY (*)    Protein, ur 30 (*)    Leukocytes,Ua LARGE (*)    Bacteria, UA MANY (*)    All other components within normal limits     EKG     RADIOLOGY  I personally viewed, evaluated, and interpreted these images as part of my medical decision making, as well as reviewing the written report by the radiologist.  ED Provider Interpretation:  Possible subacute floating in the pubic symphysis concerning for fracture.  No evidence of injury on the lumbar spine.  CT PELVIS WO CONTRAST  Result Date: 04/03/2023 CLINICAL DATA:  Pelvis pain, stress fracture suspected, no prior imaging EXAM: CT PELVIS WITHOUT CONTRAST TECHNIQUE: Multidetector CT imaging of the pelvis was performed following the standard protocol without intravenous contrast. RADIATION DOSE REDUCTION: This exam was performed according to the departmental dose-optimization program which includes automated exposure control, adjustment of the mA and/or kV according to patient size and/or use of iterative reconstruction technique. COMPARISON:  None Available. FINDINGS: Urinary Tract:  No abnormality visualized. Bowel:  Unremarkable visualized pelvic bowel loops. Vascular/Lymphatic: No pathologically enlarged lymph nodes. No significant vascular abnormality seen. Reproductive:  No mass or other significant abnormality Other: No intraperitoneal free fluid. No intraperitoneal free gas. No organized fluid collection. Musculoskeletal: Limited evaluation due to streak artifact. Lumbosacral posterior and interbody surgical hardware partially visualized. Total bilateral hip arthroplasty. No CT findings suggest surgical hardware complication. Diffusely decreased bone density. No periprostatic acute fracture along the bilateral hip surgical hardware. No acute displaced fracture or diastasis of the bones of the pelvis. Old healed right inferior and superior pubic rami fractures. Poorly visualized slight periosteal reaction along the pubic symphysis (7:30) that may represent a subacute fracture. Other: None. IMPRESSION: 1. Negative for acute traumatic injury. 2. Poorly visualized slight periosteal reaction along the pubic symphysis that may represent a subacute fracture. Limited evaluation due to streak artifact. Finding would likely be better visualized on radiograph. Electronically Signed   By: Tish Frederickson M.D.   On: 04/03/2023 23:40   CT Lumbar Spine Wo Contrast  Result Date: 04/03/2023 CLINICAL DATA:  Low back pain, cauda equina syndrome suspected. Left groin and leg pain. EXAM: CT LUMBAR SPINE WITHOUT CONTRAST TECHNIQUE: Multidetector CT imaging of the lumbar spine was performed without intravenous contrast administration. Multiplanar CT image reconstructions were also generated. RADIATION DOSE REDUCTION: This exam was performed according to the departmental dose-optimization program which includes automated exposure control, adjustment of the mA and/or kV according to patient size and/or use of iterative reconstruction technique. COMPARISON:  None Available. FINDINGS: Segmentation: 5 lumbar type vertebrae. Alignment: Severe convex leftward scoliosis.  No subluxation. Vertebrae: Diffuse severe osteopenia. No visible fracture or focal bone lesion. Paraspinal and other soft tissues: Negative Disc levels: Partial fusion across the disc spaces. Diffuse degenerative disc changes. Posterior spinal rods and fusion changes throughout the visualized lower thoracic and lumbar spine into the sacrum. IMPRESSION: Severe convex leftward scoliosis. Severe osteopenia. No acute bony abnormality. Electronically Signed  By: Charlett Nose M.D.   On: 04/03/2023 23:34    PROCEDURES:  Critical Care performed: No  Procedures   MEDICATIONS ORDERED IN ED: Medications  cefTRIAXone (ROCEPHIN) 1 g in sodium chloride 0.9 % 100 mL IVPB (1 g Intravenous New Bag/Given 04/04/23 0028)  fentaNYL (SUBLIMAZE) injection 100 mcg (100 mcg Intramuscular Given 04/03/23 2238)  ondansetron (ZOFRAN-ODT) disintegrating tablet 8 mg (8 mg Oral Given 04/03/23 2242)  HYDROmorphone (DILAUDID) injection 1 mg (1 mg Intravenous Given 04/04/23 0027)  sodium chloride 0.9 % bolus 1,000 mL (1,000 mLs Intravenous New Bag/Given 04/04/23 0025)     IMPRESSION / MDM / ASSESSMENT AND PLAN / ED COURSE  I reviewed the triage vital signs and the nursing  notes.                                 Differential diagnosis includes, but is not limited to, hip fracture, hardware malformation, pelvis fracture, cauda equina, given bradycardia, UTI, pyelonephritis   Patient's presentation is most consistent with acute presentation with potential threat to life or bodily function.   Patient's diagnosis is consistent with acute hip pain and unable to bear weight, UTI. Patient presented to the ED with one day history of L hip/pelvis pain with no trauma. Patient arrived with limited ROM to L hip.  Patient had no falls, no trauma.  History of hip replacement bilaterally.  Patient also has severe degenerative disease through the spine.  Patient was neurovascularly intact in both lower extremities.  Patient had imaging of her pelvis and spine.  There is a possible pubic rami fracture along the symphysis that is not well-visualized on CT.  At this time we will order MRI for further evaluation as patient still has limited range of motion and cannot bear weight on the left hip.  During the workup patient had labs which found slight AKI and UTI.  Started fluids and antibiotics for same.  After multiple rounds of pain meds patient has no relief of symptoms and will admit to the hospitalist service for intractable pain, UTI.      FINAL CLINICAL IMPRESSION(S) / ED DIAGNOSES   Final diagnoses:  None     Rx / DC Orders   ED Discharge Orders     None        Note:  This document was prepared using Dragon voice recognition software and may include unintentional dictation errors.   Racheal Patches, PA-C 04/04/23 0050    Corena Herter, MD 04/04/23 1501

## 2023-04-04 ENCOUNTER — Emergency Department: Payer: Medicare Other

## 2023-04-04 DIAGNOSIS — R109 Unspecified abdominal pain: Secondary | ICD-10-CM | POA: Diagnosis present

## 2023-04-04 DIAGNOSIS — N390 Urinary tract infection, site not specified: Secondary | ICD-10-CM | POA: Diagnosis present

## 2023-04-04 DIAGNOSIS — I110 Hypertensive heart disease with heart failure: Secondary | ICD-10-CM | POA: Diagnosis present

## 2023-04-04 DIAGNOSIS — Z981 Arthrodesis status: Secondary | ICD-10-CM | POA: Diagnosis not present

## 2023-04-04 DIAGNOSIS — Z86718 Personal history of other venous thrombosis and embolism: Secondary | ICD-10-CM | POA: Diagnosis not present

## 2023-04-04 DIAGNOSIS — D500 Iron deficiency anemia secondary to blood loss (chronic): Secondary | ICD-10-CM | POA: Diagnosis present

## 2023-04-04 DIAGNOSIS — Z1152 Encounter for screening for COVID-19: Secondary | ICD-10-CM | POA: Diagnosis not present

## 2023-04-04 DIAGNOSIS — K219 Gastro-esophageal reflux disease without esophagitis: Secondary | ICD-10-CM | POA: Diagnosis present

## 2023-04-04 DIAGNOSIS — M25552 Pain in left hip: Secondary | ICD-10-CM | POA: Diagnosis present

## 2023-04-04 DIAGNOSIS — Z801 Family history of malignant neoplasm of trachea, bronchus and lung: Secondary | ICD-10-CM | POA: Diagnosis not present

## 2023-04-04 DIAGNOSIS — Z833 Family history of diabetes mellitus: Secondary | ICD-10-CM | POA: Diagnosis not present

## 2023-04-04 DIAGNOSIS — E876 Hypokalemia: Secondary | ICD-10-CM | POA: Diagnosis present

## 2023-04-04 DIAGNOSIS — R937 Abnormal findings on diagnostic imaging of other parts of musculoskeletal system: Secondary | ICD-10-CM | POA: Diagnosis present

## 2023-04-04 DIAGNOSIS — F32A Depression, unspecified: Secondary | ICD-10-CM | POA: Diagnosis present

## 2023-04-04 DIAGNOSIS — X58XXXA Exposure to other specified factors, initial encounter: Secondary | ICD-10-CM | POA: Diagnosis present

## 2023-04-04 DIAGNOSIS — Z96643 Presence of artificial hip joint, bilateral: Secondary | ICD-10-CM | POA: Diagnosis present

## 2023-04-04 DIAGNOSIS — Z7901 Long term (current) use of anticoagulants: Secondary | ICD-10-CM | POA: Diagnosis not present

## 2023-04-04 DIAGNOSIS — E871 Hypo-osmolality and hyponatremia: Secondary | ICD-10-CM | POA: Diagnosis present

## 2023-04-04 DIAGNOSIS — R1032 Left lower quadrant pain: Secondary | ICD-10-CM | POA: Diagnosis present

## 2023-04-04 DIAGNOSIS — M419 Scoliosis, unspecified: Secondary | ICD-10-CM | POA: Diagnosis present

## 2023-04-04 DIAGNOSIS — E861 Hypovolemia: Secondary | ICD-10-CM | POA: Diagnosis present

## 2023-04-04 DIAGNOSIS — S76012A Strain of muscle, fascia and tendon of left hip, initial encounter: Secondary | ICD-10-CM | POA: Diagnosis present

## 2023-04-04 DIAGNOSIS — I5032 Chronic diastolic (congestive) heart failure: Secondary | ICD-10-CM | POA: Diagnosis present

## 2023-04-04 DIAGNOSIS — R000 Tachycardia, unspecified: Secondary | ICD-10-CM | POA: Diagnosis not present

## 2023-04-04 DIAGNOSIS — D6851 Activated protein C resistance: Secondary | ICD-10-CM | POA: Diagnosis present

## 2023-04-04 DIAGNOSIS — M479 Spondylosis, unspecified: Secondary | ICD-10-CM | POA: Diagnosis present

## 2023-04-04 DIAGNOSIS — N179 Acute kidney failure, unspecified: Secondary | ICD-10-CM | POA: Diagnosis present

## 2023-04-04 LAB — HIV ANTIBODY (ROUTINE TESTING W REFLEX): HIV Screen 4th Generation wRfx: NONREACTIVE

## 2023-04-04 LAB — COMPREHENSIVE METABOLIC PANEL
ALT: 24 U/L (ref 0–44)
AST: 25 U/L (ref 15–41)
Albumin: 3.3 g/dL — ABNORMAL LOW (ref 3.5–5.0)
Alkaline Phosphatase: 121 U/L (ref 38–126)
Anion gap: 8 (ref 5–15)
BUN: 22 mg/dL (ref 8–23)
CO2: 24 mmol/L (ref 22–32)
Calcium: 8.1 mg/dL — ABNORMAL LOW (ref 8.9–10.3)
Chloride: 101 mmol/L (ref 98–111)
Creatinine, Ser: 1.04 mg/dL — ABNORMAL HIGH (ref 0.44–1.00)
GFR, Estimated: 59 mL/min — ABNORMAL LOW (ref >60)
Glucose, Bld: 141 mg/dL — ABNORMAL HIGH (ref 70–99)
Potassium: 3 mmol/L — ABNORMAL LOW (ref 3.5–5.1)
Sodium: 133 mmol/L — ABNORMAL LOW (ref 135–145)
Total Bilirubin: 0.9 mg/dL (ref 0.3–1.2)
Total Protein: 6.6 g/dL (ref 6.5–8.1)

## 2023-04-04 LAB — CBC
HCT: 37 % (ref 36.0–46.0)
Hemoglobin: 12.2 g/dL (ref 12.0–15.0)
MCH: 28.7 pg (ref 26.0–34.0)
MCHC: 33 g/dL (ref 30.0–36.0)
MCV: 87.1 fL (ref 80.0–100.0)
Platelets: 131 10*3/uL — ABNORMAL LOW (ref 150–400)
RBC: 4.25 MIL/uL (ref 3.87–5.11)
RDW: 15.9 % — ABNORMAL HIGH (ref 11.5–15.5)
WBC: 12.8 10*3/uL — ABNORMAL HIGH (ref 4.0–10.5)
nRBC: 0 % (ref 0.0–0.2)

## 2023-04-04 LAB — HEMOGLOBIN A1C
Hgb A1c MFr Bld: 6.3 % — ABNORMAL HIGH (ref 4.8–5.6)
Mean Plasma Glucose: 134.11 mg/dL

## 2023-04-04 LAB — LACTIC ACID, PLASMA: Lactic Acid, Venous: 1.4 mmol/L (ref 0.5–1.9)

## 2023-04-04 MED ORDER — HYDROMORPHONE HCL 1 MG/ML IJ SOLN: 1.0000 mg | Freq: Three times a day (TID) | INTRAMUSCULAR | Status: DC | PRN

## 2023-04-04 MED ORDER — METOPROLOL SUCCINATE ER 25 MG PO TB24
25.0000 mg | ORAL_TABLET | Freq: Every day | ORAL | Status: DC
  Administered 2023-04-05: 25 mg via ORAL
  Filled 2023-04-04 (×2): qty 1

## 2023-04-04 MED ORDER — DOCUSATE SODIUM 100 MG PO CAPS
100.0000 mg | ORAL_CAPSULE | Freq: Every day | ORAL | Status: DC
  Administered 2023-04-04 – 2023-04-10 (×7): 100 mg via ORAL
  Filled 2023-04-04 (×8): qty 1

## 2023-04-04 MED ORDER — TRAZODONE HCL 100 MG PO TABS
200.0000 mg | ORAL_TABLET | Freq: Every evening | ORAL | Status: DC | PRN
  Administered 2023-04-04 – 2023-04-09 (×5): 200 mg via ORAL
  Filled 2023-04-04 (×5): qty 2

## 2023-04-04 MED ORDER — ONDANSETRON 4 MG PO TBDP: 4.0000 mg | ORAL_TABLET | Freq: Three times a day (TID) | ORAL | Status: DC | PRN

## 2023-04-04 MED ORDER — VENLAFAXINE HCL ER 75 MG PO CP24
75.0000 mg | ORAL_CAPSULE | Freq: Every day | ORAL | Status: DC
  Administered 2023-04-04 – 2023-04-10 (×7): 75 mg via ORAL
  Filled 2023-04-04 (×7): qty 1

## 2023-04-04 MED ORDER — LACTATED RINGERS IV SOLN: INTRAVENOUS | Status: AC

## 2023-04-04 MED ORDER — SODIUM CHLORIDE 0.9 % IV SOLN
1.0000 g | Freq: Once | INTRAVENOUS | Status: AC
  Administered 2023-04-04: 1 g via INTRAVENOUS
  Filled 2023-04-04: qty 10

## 2023-04-04 MED ORDER — IOHEXOL 300 MG/ML  SOLN
100.0000 mL | Freq: Once | INTRAMUSCULAR | Status: AC | PRN
  Administered 2023-04-04: 100 mL via INTRAVENOUS

## 2023-04-04 MED ORDER — HYDROMORPHONE HCL 1 MG/ML IJ SOLN
1.0000 mg | Freq: Once | INTRAMUSCULAR | Status: AC
  Administered 2023-04-04: 1 mg via INTRAVENOUS
  Filled 2023-04-04: qty 1

## 2023-04-04 MED ORDER — SODIUM CHLORIDE 0.9 % IV BOLUS
1000.0000 mL | Freq: Once | INTRAVENOUS | Status: AC
  Administered 2023-04-04: 1000 mL via INTRAVENOUS

## 2023-04-04 MED ORDER — FESOTERODINE FUMARATE ER 4 MG PO TB24
4.0000 mg | ORAL_TABLET | Freq: Every day | ORAL | Status: DC
  Administered 2023-04-04 – 2023-04-09 (×6): 4 mg via ORAL
  Filled 2023-04-04 (×7): qty 1

## 2023-04-04 MED ORDER — POLYETHYLENE GLYCOL 3350 17 G PO PACK
17.0000 g | PACK | Freq: Every day | ORAL | Status: DC
  Administered 2023-04-04 – 2023-04-10 (×7): 17 g via ORAL
  Filled 2023-04-04 (×7): qty 1

## 2023-04-04 MED ORDER — HYDROMORPHONE HCL 1 MG/ML IJ SOLN
1.0000 mg | INTRAMUSCULAR | Status: AC | PRN
  Administered 2023-04-04 (×3): 1 mg via INTRAVENOUS
  Filled 2023-04-04 (×3): qty 1

## 2023-04-04 MED ORDER — RIVAROXABAN 20 MG PO TABS
20.0000 mg | ORAL_TABLET | Freq: Every day | ORAL | Status: DC
  Administered 2023-04-04 – 2023-04-10 (×7): 20 mg via ORAL
  Filled 2023-04-04 (×7): qty 1

## 2023-04-04 MED ORDER — ACETAMINOPHEN 325 MG PO TABS
650.0000 mg | ORAL_TABLET | Freq: Four times a day (QID) | ORAL | Status: DC | PRN
  Administered 2023-04-05 – 2023-04-10 (×9): 650 mg via ORAL
  Filled 2023-04-04 (×9): qty 2

## 2023-04-04 MED ORDER — PANTOPRAZOLE SODIUM 40 MG PO TBEC
40.0000 mg | DELAYED_RELEASE_TABLET | Freq: Every day | ORAL | Status: DC
  Administered 2023-04-04 – 2023-04-10 (×7): 40 mg via ORAL
  Filled 2023-04-04 (×7): qty 1

## 2023-04-04 MED ORDER — SODIUM CHLORIDE 0.9% FLUSH
3.0000 mL | Freq: Two times a day (BID) | INTRAVENOUS | Status: DC
  Administered 2023-04-04 – 2023-04-10 (×10): 3 mL via INTRAVENOUS

## 2023-04-04 MED ORDER — SODIUM CHLORIDE 0.9 % IV SOLN
1.0000 g | INTRAVENOUS | Status: DC
  Administered 2023-04-04 – 2023-04-09 (×5): 1 g via INTRAVENOUS
  Filled 2023-04-04 (×7): qty 10

## 2023-04-04 MED ORDER — ATENOLOL 25 MG PO TABS: 25.0000 mg | ORAL_TABLET | Freq: Every day | ORAL | Status: DC

## 2023-04-04 MED ORDER — POTASSIUM CHLORIDE CRYS ER 20 MEQ PO TBCR
40.0000 meq | EXTENDED_RELEASE_TABLET | ORAL | Status: AC
  Administered 2023-04-04 (×2): 40 meq via ORAL
  Filled 2023-04-04 (×2): qty 2

## 2023-04-04 MED ORDER — ACETAMINOPHEN 650 MG RE SUPP: 650.0000 mg | Freq: Four times a day (QID) | RECTAL | Status: DC | PRN

## 2023-04-04 MED ORDER — MORPHINE SULFATE (PF) 2 MG/ML IV SOLN
2.0000 mg | Freq: Once | INTRAVENOUS | Status: AC
  Administered 2023-04-04: 2 mg via INTRAVENOUS
  Filled 2023-04-04: qty 1

## 2023-04-04 MED ORDER — FESOTERODINE FUMARATE ER 4 MG PO TB24: 4.0000 mg | ORAL_TABLET | Freq: Two times a day (BID) | ORAL | Status: DC

## 2023-04-04 NOTE — Assessment & Plan Note (Signed)
IV PPI. Aspiration precaution.

## 2023-04-04 NOTE — Hospital Course (Addendum)
Kristi Lee is a 67 y.o. female with medical history significant for scoliosis status postsurgery, congestive heart failure diastolic, DVT, GERD, hypertension presenting to ED 08/19 with left-sided groin pain. Patient was getting out of bed this morning and felt that sharp pain in the left pelvis left flank left hip area patient states the pain was 10 out of 10 it was so bad that she started to ice it she was not able to move out of her bed 08/19: in ED, CT imaging limited, question pubic rami fx, admitted to hospitalist service and MRI ordered. Start IV abx for UTI also.  08/20: on MRI hip/pelvis, "Significant inflammation/edema and fluid involving the left iliacus muscle suggesting a muscle tear. The iliopsoas tendon appears to be intact." Per ortho, no surgical intervention needed 08/21: workign w/ PT/OT, recs for SNF, pain control has been challenging, trial IV morphine for breakthrough. TOC consult for SNF placement rehab.  08/22: overnight, febrile/tachycardic - no new BCx drawn, fever and HR responded to tylenol/pain control. Pt feeling well later today.  08/23: 24h no fever, WBC improving. D/c ordered but was tachycardic on EMS arrival and they would not take her, d/c cancelled 08/24 (Sat): HR improved back on home dose beta blocker. D/c ordered again but SNF could not take her despite previously telling TOC she could come Sat, d/t meds had been sent back to pharmacy when she didn't come yesterday... d/c cancelled again. Hopefully for SNF Monday  08/25 Wynelle Link): stable.   Consultants:  Curbsied to Orthopedics as below, no formal consult at this time   Procedures: None       ASSESSMENT & PLAN:   Principal Problem:   Left lower quadrant abdominal pain Active Problems:   Abnormal LFTs   Essential hypertension   Gastro-esophageal reflux disease without esophagitis   DVT (deep venous thrombosis) (HCC)   AKI (acute kidney injury) (HCC)   Hypomagnesemia   Hypokalemia   Iron  deficiency anemia due to chronic blood loss   Left flank pain  Left lower quadrant abdominal pain and groin pain secondary to iliacus muscle tear Dr Meriam Sprague (hospitalist) discussed the MRI findings 08/20 with Dr. Leron Croak orthopedic surgeon --> patient does not require surgery and will need physical therapy and pain management. PT OT consulted --> SNF rehab  Monitor H&H closely as patient is on Xarelto, in the setting of MRI findings --> no anemia.   Iron deficiency anemia NO active blood loss at this time.  Pt sees Dr.kowalski and [facility].    Hypokalemia Replace as needed Monitor BMP   Hypovolemic hyponatremia - resolved Monitor BMP   Hypomagnesemia Will check levels and manage.   UTI Briefly meeting sepsis criteria w/ elevated WBC + fever and tachycardia overnight 08/21-08/22 but sepsis parameters no longer met today 08/23  Rocephin Await cultures --> never sent on admission, unable to add on, will not recollect d/t has bene on abx   AKI (acute kidney injury) (HCC) - improving then Cr slightly up again today Patient with baseline creatinine within normal limits of 0.8 however presented with creatinine of 1.2 S/p IV fluids, repeat today 08/22 given Cr bumped up again slightly  Monitor renal function closely Avoid nephrotoxic agents   DVT (deep venous thrombosis) (HCC) Factor V Leiden Continue Xarelto   Gastro-esophageal reflux disease without esophagitis PPI.  Aspiration precaution.   Essential hypertension hold hydrochlorothiazide  Conitnue metoprolol Monitor blood pressure closely     DVT prophylaxis: xarelto Pertinent IV fluids/nutrition: no continuous IV fluids  Central lines / invasive devices: none  Code Status: FULL CODE ACP documentation reviewed: none on file   Current Admission Status: inpatient   TOC needs / Dispo plan: SNF rehab Barriers to discharge / significant pending items: pain control

## 2023-04-04 NOTE — Assessment & Plan Note (Addendum)
Mild AKI, will follow.  Lab Results  Component Value Date   CREATININE 1.23 (H) 04/03/2023   CREATININE 0.82 07/22/2021   CREATININE 0.88 05/26/2021  Start pt on LR 30 CC/ hour x 24 hours as we are giving her contrast for her ct to identify any obstruction.

## 2023-04-04 NOTE — Assessment & Plan Note (Signed)
Will replace and follow 

## 2023-04-04 NOTE — Progress Notes (Addendum)
Progress Note   Patient: Kristi Lee UJW:[id number] DOB: 05-20-1956 DOA: 04/03/2023     0 DOS: the patient was seen and examined on 04/04/2023     Subjective: Patient seen and examined at bedside this morning Still had some complaints of left flank pain in the region of the muscle tear. She denies nausea vomiting abdominal pain chest pain  Brief hospital course: From HPI "[name] is a 67 y.o. female with medical history significant for scoliosis status postsurgery, congestive heart failure diastolic, DVT, GERD, hypertension presenting with left-sided groin pain. Patient was getting out of bed this morning and felt that sharp pain in the left pelvis left flank left hip area patient states the pain was 10 out of 10 it was so bad that she started to ice it she was not able to move out of her bed no falls reported no incontinence no bandlike sensation.  She took some pain medicine and was waiting for the pain to ease off which she did in the latter half of the day.  Patient states that it came back pain never resolved and therefore came to emergency room for further management.  "   Assessment and Plan: Left lower quadrant abdominal pain secondary to iliacus muscle tear MRI of the hip and pelvics detected significant inflammation/edema and fluid collection in the left iliac [place] muscle suggesting a mass of but the iliopsoas tendon remained intact.  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 CT scan of the lumbar spine showed severe convex leftward scoliosis but no other acute pathology CT scan of abdomen and pelvic did not show any acute findings aside hepatic steatosis I discussed the MRI findings with Dr. Leron Croak orthopedic surgeon.   Dr. Joice Lofts reviewed the patient's MRI report and according to him patient does not require surgery and will need physical therapy and pain management. He asked me if I would like a formal consult, however I appreciated this  input as this is enough. PT OT consulted We will monitor H&H closely as patient is on Xarelto  in the setting of MRI findings.   Iron deficiency anemia due to chronic blood loss Resolved.  Pt sees Dr.kowalski and [facility].    Hypokalemia Potassium noted to be 3.0 today Continue repletion and monitoring   Hypovolemic hyponatremia Continue current IV fluid   Hypomagnesemia Will check levels and manage.   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 Continue Xarelto   Gastro-esophageal reflux disease without esophagitis IV PPI.  Aspiration precaution.   Essential hypertension Continue hydrochlorothiazide and metoprolol Monitor blood pressure closely       DVT prophylaxis:  Xarelto      Code Status: Full Code    Family Communication: Daughter at bedside.   Physical Exam: HENT: Atraumatic normocephalic Cardiovascular:     Rate and Rhythm: Normal rate and regular rhythm.     Heart sounds: Normal heart sounds.  Pulmonary:     Effort: Pulmonary effort is normal.   Abdominal: Tenderness noted to the left groin area Musculoskeletal: Tenderness to the left groin able to lift the left leg above the bed but with some discomfort Skin:    General: Skin is warm.  Neurological: Alert and oriented x 3 able to lift the left leg above  the bed but with some discomfort Psychiatric: Normal mood    Behavior: Behavior normal. Behavior is cooperative.     Vitals:   04/04/23 1030 04/04/23 1100 04/04/23 1218 04/04/23 1300  BP: 114/61 (!) 103/55 (!) 90/54 113/64  Pulse: 80 89 90 89  Resp:  18 17 17   Temp:   98.5 F (36.9 C) 99.4 F (37.4 C)  TempSrc:   Oral Oral  SpO2: 96% 100% 97% 98%  Weight:      Height:        Data Reviewed:  Reviewed MRI of the hip and pelvics which detected significant inflammation/edema and fluid collection in the left iliac [place] muscle suggesting a mass of but the iliopsoas tendon remained intact.  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 CT scan of the lumbar spine showed severe convex leftward scoliosis but no other acute pathology CT scan of abdomen and pelvic did not show any acute findings aside hepatic steatosis I have also reviewed patient's CBC, CMP with results as documented below     Latest Ref Rng & Units 04/04/2023    7:17 AM 04/03/2023    7:37 PM 07/22/2021   11:20 AM  CMP  Glucose 70 - 99 mg/dL 696  295  87   BUN 8 - 23 mg/dL 22  23  16    Creatinine 0.44 - 1.00 mg/dL 2.84  1.32  4.40   Sodium 135 - 145 mmol/L 133  132  140   Potassium 3.5 - 5.1 mmol/L 3.0  3.1  3.5   Chloride 98 - 111 mmol/L 101  96  106   CO2 22 - 32 mmol/L 24  24  26    Calcium 8.9 - 10.3 mg/dL 8.1  8.9  8.8   Total Protein 6.5 - 8.1 g/dL 6.6  7.3  7.2   Total Bilirubin 0.3 - 1.2 mg/dL 0.9  1.0  0.4   Alkaline Phos 38 - 126 U/L 121  143  155   AST 15 - 41 U/L 25  29  22    ALT 0 - 44 U/L 24  26  21         Latest Ref Rng & Units 04/04/2023    7:17 AM 04/03/2023    7:37 PM 07/22/2021   11:20 AM  CBC  WBC 4.0 - 10.5 K/uL 12.8  17.1  9.6   Hemoglobin 12.0 - 15.0 g/dL 10.2  72.5  9.9   Hematocrit 36.0 - 46.0 % 37.0  41.9  34.3   Platelets 150 - 400 K/uL 131  178  281      Total time spent: I spent a total of 56 minutes reviewing patient's chart, looking at patient's CT scan imaging, MRI, discussing plan of care with orthopedic surgeon Dr. Joice Lofts as well as discussing patient's condition with her and coordinating care with PT OT and nursing staff   Author: Loyce Dys, MD 04/04/2023 1:40 PM  For on call review [email or web address].

## 2023-04-04 NOTE — ED Notes (Signed)
Patient transported to CT 

## 2023-04-04 NOTE — Evaluation (Signed)
Occupational Therapy Evaluation Patient Details Name: Kristi Lee MRN: [id number] DOB: 1956-01-22 Today's Date: 04/04/2023   History of Present Illness Pt is a 67 y.o. female presenting to hospital 04/03/23 with c/o sharp pain to L hip and L groin region (woke up with; no reported trauma).  Imaging showing severe convex leftward scoliosis; significant inflammation/edema and fluid involving the left iliacus muscle suggesting a muscle tear. The iliopsoas tendon appears to be intact.  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  Pt admitted with LLQ abdominal pain secondary to iliacus muscle tear, hypokalemia, and AKI.  PMH includes SOB, htn, depression, OA, anemia, L reverse shoulder arthroplasty.   Clinical Impression   Pt seen for OT evaluation and cotx with PT to optimize safety with ADL/mobility. Pt reports being independent until Monday morning when she woke up in severe pain and unable to stand. Pt was independent before that. OT/PT coordinated session with nursing prior to session for pain medication. Pt denies pain at rest but worsens to 7/10 with exertion/movement.  Pt required MODA  +2 for bed mobility, stood with MIN A +2 and was able to take a couple small shuffled steps along the EOB to the recliner with heavy BUE support on the RW requiring CGA-MIN A +2. Pt required MAX A for LB dressing and anticipate heavy assist for LB bathing, 2/2 pain. Pt continues to benefit from skilled OT services to maximize return to PLOF.      If plan is discharge home, recommend the following: Two people to help with walking and/or transfers;A lot of help with bathing/dressing/bathroom;Assistance with cooking/housework;Help with stairs or ramp for entrance;Assist for transportation    Functional Status Assessment  Patient has had a recent decline in their functional status and demonstrates the ability  to make significant improvements in function in a reasonable and predictable amount of time.  Equipment Recommendations  BSC/3in1    Recommendations for Other Services       Precautions / Restrictions Precautions Precautions: Fall Restrictions Weight Bearing Restrictions: No Other Position/Activity Restrictions: L iliacus muscle tear      Mobility Bed Mobility Overal bed mobility: Needs Assistance Bed Mobility: Supine to Sit     Supine to sit: Mod assist, +2 for physical assistance     General bed mobility comments: assist for trunk and B LE's; vc's for technique and use of bed rail; vc's for technique to scoot forwards on EOB    Transfers Overall transfer level: Needs assistance Equipment used: Rolling walker (2 wheels) Transfers: Sit to/from Stand, Bed to chair/wheelchair/BSC Sit to Stand: Min assist, +2 physical assistance     Step pivot transfers: Contact guard assist, Min assist, +2 physical assistance     General transfer comment: vc's for UE/LE placement; assist to initiate stand and control descent sitting; vc's for technique; stand step turn bed to recliner with RW use      Balance Overall balance assessment: Needs assistance Sitting-balance support: No upper extremity supported, Feet supported Sitting balance-Leahy Scale: Good Sitting balance - Comments: steady sitting reaching within BOS   Standing balance support: Bilateral upper extremity supported, Reliant on assistive device for balance Standing balance-Leahy Scale: Fair Standing balance comment: initial min assist for static standing balance but then steady with RW use  ADL either performed or assessed with clinical judgement   ADL Overall ADL's : Needs assistance/impaired                     Lower Body Dressing: Maximal assistance   Toilet Transfer: Stand-pivot;Rolling walker (2 wheels);+2 for physical assistance;Moderate assistance;Minimal  assistance Toilet Transfer Details (indicate cue type and reason): anticipate MOD A +2 to stand, CGA-MIN A +2 to step pivot to a BSC with RW                 Vision         Perception         Praxis         Pertinent Vitals/Pain Pain Assessment Pain Assessment: 0-10 Pain Score: 7  Pain Location: 0/10 at rest beginning/end of session; 7/10 with activity  LLQ abdominal pain Pain Descriptors / Indicators: Sharp, Grimacing, Guarding Pain Intervention(s): Limited activity within patient's tolerance, Monitored during session, Premedicated before session, Repositioned     Extremity/Trunk Assessment Upper Extremity Assessment Upper Extremity Assessment: Overall WFL for tasks assessed   Lower Extremity Assessment Lower Extremity Assessment: RLE deficits/detail;LLE deficits/detail RLE Deficits / Details: at least 3/5 AROM hip flexion, knee flexion/extension, and DF/PF LLE Deficits / Details: hip flexion at least 2+/5; knee flexion/extension at least 2+/5; DF at least 3/5 LLE: Unable to fully assess due to pain   Cervical / Trunk Assessment Cervical / Trunk Assessment: Kyphotic   Communication Communication Communication: No apparent difficulties Cueing Techniques: Verbal cues   Cognition Arousal: Alert Behavior During Therapy: WFL for tasks assessed/performed Overall Cognitive Status: Within Functional Limits for tasks assessed                                       General Comments       Exercises     Shoulder Instructions      Home Living Family/patient expects to be discharged to:: Private residence Living Arrangements: Spouse/significant other (Daughter, her husband, and grandkids live next door) Available Help at Discharge: Family;Available 24 hours/day (Husband) Type of Home: House Home Access: Stairs to enter [employer] of Steps: 3 Entrance Stairs-Rails: Right;Left;Can reach both Home Layout: One level     Bathroom  Shower/Tub: Producer, television/film/video: Handicapped height     Home Equipment: Shower seat - built in;Toilet riser;Rolling Walker (2 wheels);Cane - single point;Rollator (4 wheels);BSC/3in1;Hand held shower head;Adaptive equipment Adaptive Equipment: Sock aid Additional Comments: Sock aide      Prior Functioning/Environment Prior Level of Function : Independent/Modified Independent             Mobility Comments: Independent.  1 fall about 2 months ago (tripped over feet). ADLs Comments: Independent with ADL's.  Takes care of 2 grandchildren.        OT Problem List: Pain;Decreased knowledge of use of DME or AE;Impaired balance (sitting and/or standing)      OT Treatment/Interventions: Self-care/ADL training;Therapeutic exercise;Therapeutic activities;DME and/or AE instruction;Patient/family education;Balance training    OT Goals(Current goals can be found in the care plan section) Acute Rehab OT Goals Patient Stated Goal: have less pain OT Goal Formulation: With patient Time For Goal Achievement: 04/18/23 Potential to Achieve Goals: Good ADL Goals Pt Will Perform Lower Body Dressing: with adaptive equipment;sit to/from stand;with supervision Pt Will Transfer to Toilet: with contact guard assist;stand pivot transfer;bedside commode (LRAD) Pt Will Perform Toileting - Clothing  Manipulation and hygiene: with modified independence;sitting/lateral leans  OT Frequency: Min 1X/week    Co-evaluation PT/OT/SLP Co-Evaluation/Treatment: Yes Reason for Co-Treatment: For patient/therapist safety;To address functional/ADL transfers PT goals addressed during session: Mobility/safety with mobility;Balance;Proper use of DME OT goals addressed during session: ADL's and self-care      AM-PAC OT "6 Clicks" Daily Activity     Outcome Measure Help from another person eating meals?: None Help from another person taking care of personal grooming?: None Help from another person  toileting, which includes using toliet, bedpan, or urinal?: A Lot Help from another person bathing (including washing, rinsing, drying)?: A Lot Help from another person to put on and taking off regular upper body clothing?: None Help from another person to put on and taking off regular lower body clothing?: A Lot 6 Click Score: 18   End of Session Equipment Utilized During Treatment: Gait belt;Rolling walker (2 wheels) Nurse Communication: Mobility status  Activity Tolerance: Patient limited by pain;Patient tolerated treatment well Patient left: in chair;with call bell/phone within reach;with chair alarm set;Other (comment) (pastor in room)  OT Visit Diagnosis: Other abnormalities of gait and mobility (R26.89);Pain Pain - Right/Left: Left Pain - part of body: Leg                Time: 1511-1540 OT Time Calculation (min): 29 min Charges:  OT General Charges $OT Visit: 1 Visit OT Evaluation $OT Eval Low Complexity: 1 Low  Arman Filter., MPH, MS, OTR/L ascom [phone] 04/04/23, 4:57 PM

## 2023-04-04 NOTE — Evaluation (Signed)
Physical Therapy Evaluation Patient Details Name: Kristi Lee MRN: [id number] DOB: [birth year] Today's Date: 04/04/2023  History of Present Illness  Pt is a 67 y.o. female presenting to hospital 04/03/23 with c/o sharp pain to L hip and L groin region (woke up with; no reported trauma).  Imaging showing severe convex leftward scoliosis; significant inflammation/edema and fluid involving the left iliacus muscle suggesting a muscle tear. The iliopsoas tendon appears to be intact.  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  Pt admitted with LLQ abdominal pain secondary to iliacus muscle tear, hypokalemia, and AKI.  PMH includes SOB, htn, depression, OA, anemia, L reverse shoulder arthroplasty.  Clinical Impression  PT/OT co-evaluation.  Prior to recent medical concerns, pt was independent with functional mobility; lives with her husband.  LLQ abdominal pain 0/10 at rest beginning/end of session but 7/10 with activity (pt pre-medicated with pain medication).  Currently pt is mod assist x2 semi-supine to sitting EOB; min assist x2 to stand from bed up to RW; and CGA to min assist x2 to take steps with RW bed to recliner.  Limited activity d/t increased pain with activity.  Pt would currently benefit from skilled PT to address noted impairments and functional limitations (see below for any additional details).  Upon hospital discharge, pt would benefit from ongoing therapy.     If plan is discharge home, recommend the following: Two people to help with walking and/or transfers;A lot of help with bathing/dressing/bathroom;Assistance with cooking/housework;Assist for transportation;Help with stairs or ramp for entrance   Can travel by private vehicle        Equipment Recommendations Rolling walker (2 wheels);BSC/3in1;Wheelchair (measurements PT);Wheelchair cushion (measurements PT)  Recommendations  for Other Services       Functional Status Assessment Patient has had a recent decline in their functional status and demonstrates the ability to make significant improvements in function in a reasonable and predictable amount of time.     Precautions / Restrictions Precautions Precautions: Fall Restrictions Weight Bearing Restrictions: No Other Position/Activity Restrictions: L iliacus muscle tear      Mobility  Bed Mobility Overal bed mobility: Needs Assistance Bed Mobility: Supine to Sit     Supine to sit: Mod assist, +2 for physical assistance     General bed mobility comments: assist for trunk and B LE's; vc's for technique and use of bed rail; vc's for technique to scoot forwards on EOB    Transfers Overall transfer level: Needs assistance Equipment used: Rolling walker (2 wheels) Transfers: Sit to/from Stand, Bed to chair/wheelchair/BSC Sit to Stand: Min assist, +2 physical assistance   Step pivot transfers: Contact guard assist, Min assist, +2 physical assistance       General transfer comment: vc's for UE/LE placement; assist to initiate stand and control descent sitting; vc's for technique; stand step turn bed to recliner with RW use    Ambulation/Gait               General Gait Details: Deferred d/t significant pain with activity  Stairs            Wheelchair Mobility     Tilt Bed    Modified Rankin (Stroke Patients Only)       Balance Overall balance assessment: Needs assistance Sitting-balance support: No upper extremity supported, Feet supported Sitting balance-Leahy Scale: Good Sitting balance - Comments: steady sitting reaching within BOS   Standing balance support: Bilateral  upper extremity supported, Reliant on assistive device for balance Standing balance-Leahy Scale: Fair Standing balance comment: initial min assist for static standing balance but then steady with RW use                             Pertinent  Vitals/Pain Pain Assessment Pain Assessment: 0-10 Pain Score:  (0/10 at rest beginning/end of session; 7/10 with activity) Pain Location: LLQ abdominal pain Pain Descriptors / Indicators: Sharp, Grimacing, Guarding Pain Intervention(s): Limited activity within patient's tolerance, Monitored during session, Premedicated before session, Repositioned SpO2 sats WFL on room air during session; HR 90-113 bpm during sessions activities.    Home Living Family/patient expects to be discharged to:: Private residence Living Arrangements: Spouse/significant other (Daughter, her husband, and grandkids live next door) Available Help at Discharge: Family;Available 24 hours/day (Husband) Type of Home: House Home Access: Stairs to enter Entrance Stairs-Rails: Right;Left;Can reach both Entrance Stairs-Number of Steps: 3   Home Layout: One level Home Equipment: Shower seat - built in;Toilet riser;Rolling Walker (2 wheels);Cane - single point;Rollator (4 wheels);BSC/3in1;Hand held shower head Additional Comments: Sock aide    Prior Function Prior Level of Function : Independent/Modified Independent             Mobility Comments: Independent.  1 fall about 2 months ago (tripped over feet). ADLs Comments: Independent with ADL's.  Takes care of 2 grandchildren.     Extremity/Trunk Assessment   Upper Extremity Assessment Upper Extremity Assessment: Defer to OT evaluation;Overall WFL for tasks assessed    Lower Extremity Assessment Lower Extremity Assessment: RLE deficits/detail;LLE deficits/detail RLE Deficits / Details: at least 3/5 AROM hip flexion, knee flexion/extension, and DF/PF LLE Deficits / Details: hip flexion at least 2+/5; knee flexion/extension at least 2+/5; DF at least 3/5 LLE: Unable to fully assess due to pain    Cervical / Trunk Assessment Cervical / Trunk Assessment: Kyphotic  Communication   Communication Communication: No apparent difficulties Cueing Techniques: Verbal  cues  Cognition Arousal: Alert Behavior During Therapy: WFL for tasks assessed/performed Overall Cognitive Status: Within Functional Limits for tasks assessed                                          General Comments  Nursing cleared pt for participation in physical therapy.  Pt agreeable to PT session.  MD Djan cleared pt for therapy participation and reports no plan for surgery.    Exercises     Assessment/Plan    PT Assessment Patient needs continued PT services  PT Problem List Decreased strength;Decreased activity tolerance;Decreased balance;Decreased mobility;Decreased knowledge of use of DME;Decreased knowledge of precautions;Pain       PT Treatment Interventions DME instruction;Gait training;Stair training;Functional mobility training;Therapeutic activities;Therapeutic exercise;Balance training;Patient/family education    PT Goals (Current goals can be found in the Care Plan section)  Acute Rehab PT Goals Patient Stated Goal: to improve pain and mobility PT Goal Formulation: With patient Time For Goal Achievement: 04/18/23 Potential to Achieve Goals: Good    Frequency Min 1X/week     Co-evaluation PT/OT/SLP Co-Evaluation/Treatment: Yes Reason for Co-Treatment: For patient/therapist safety;To address functional/ADL transfers PT goals addressed during session: Mobility/safety with mobility;Balance;Proper use of DME OT goals addressed during session: ADL's and self-care       AM-PAC PT "6 Clicks" Mobility  Outcome Measure Help needed turning from your back to  your side while in a flat bed without using bedrails?: A Lot Help needed moving from lying on your back to sitting on the side of a flat bed without using bedrails?: Total Help needed moving to and from a bed to a chair (including a wheelchair)?: Total Help needed standing up from a chair using your arms (e.g., wheelchair or bedside chair)?: Total Help needed to walk in hospital room?:  Total Help needed climbing 3-5 steps with a railing? : Total 6 Click Score: 7    End of Session Equipment Utilized During Treatment: Gait belt Activity Tolerance: Patient limited by pain Patient left: in chair;with call bell/phone within reach;with chair alarm set;with family/visitor present;Other (comment) ([place] in place) Nurse Communication: Mobility status;Precautions PT Visit Diagnosis: Other abnormalities of gait and mobility (R26.89);Muscle weakness (generalized) (M62.81);History of falling (Z91.81);Pain Pain - Right/Left: Left Pain - part of body:  (LLQ abdominal pain)    Time: 1511-1540 PT Time Calculation (min) (ACUTE ONLY): 29 min   Charges:   PT Evaluation $PT Eval Low Complexity: 1 Low PT Treatments $Therapeutic Activity: 8-22 mins PT General Charges $$ ACUTE PT VISIT: 1 Visit        Hendricks Limes, PT 04/04/23, 4:03 PM

## 2023-04-04 NOTE — Assessment & Plan Note (Addendum)
Started this morning while waking up.no back pain.  Spouse helped her up and could not bear weight.  D/d include UTI/pyelo/nephrolithiasis, referred left sided back pain, left hip pain, GI or GU.  We will get ct abd and pelvis with contrast.  We will get lactic acid level do no suspect any mesenteric ischemia, le pulses are 2+/ DP/TB. Pt has not associated symptoms with her left sided pain.  D/W her about contrast CT and then continue additional testing as needed. We will proceed with ct scan and MRI of pelvis.

## 2023-04-04 NOTE — Assessment & Plan Note (Signed)
Resolved.  Pt sees Dr.kowalski and [facility].

## 2023-04-04 NOTE — Assessment & Plan Note (Signed)
Pt has factor 5 and takes xarelto .

## 2023-04-04 NOTE — H&P (Addendum)
History and Physical    Patient: Kristi Lee XLK:[id number] DOB: May 31, 1956 DOA: 04/03/2023 DOS: the patient was seen and examined on 04/04/2023 PCP: [facility]  Patient coming from: Home   Chief Complaint:  Chief Complaint  Patient presents with   Groin Pain    HPI: Kristi Lee is a 67 y.o. female with medical history significant for scoliosis status postsurgery, congestive heart failure diastolic, DVT, GERD, hypertension presenting with left-sided groin pain. Patient was getting out of bed this morning and felt that sharp pain in the left pelvis left flank left hip area patient states the pain was 10 out of 10 it was so bad that she started to ice it she was not able to move out of her bed no falls reported no incontinence no bandlike sensation.  She took some pain medicine and was waiting for the pain to ease off which she did in the latter half of the day.  Patient states that it came back pain never resolved and again is at the same intensity of 10 out of 10 sharp nonradiating worse with movement and lifting or walking even when she is lifting her right leg she is having pain in the left side.  Patient denies any incontinence any numbness tingling any saddle anesthesia any bandlike sensation.  She has got an extensive history of back surgeries for her scoliosis.  No falls or trauma. Chart review shows that she has a history of left ventricular diastolic dysfunction was followed by Dr. Gwen Pounds at [facility]. Patient was diagnosed with extensive left lower extremity DVT involving most of the left common femoral vein as well as the popliteal and calf veins in March 2021. She underwent catheter directed thrombolysis with TPA by vascular surgery and started on Xarelto.    In ED patient is alert awake oriented afebrile, heart rate of 103 temperature 99.6 O2 99% on room air. Labs show: Hyponatremia 132 potassium 3.1 glucose 142 AKI with a creatinine of  1.23 EGFR 48 alk phos of 143 which has been elevated chronically. CBC shows a white count of 17.1 hemoglobin of 13.5 RDW of 15.7 normal platelet count. Urinalysis is cloudy showing large leukocytes many bacteria and more than 50 WBCs.  Review of Systems: Review of Systems  Gastrointestinal:  Positive for abdominal pain.  Genitourinary:  Positive for flank pain.    Past Medical History:  Diagnosis Date   Arthritis    back   CHF (congestive heart failure) (HCC)    Depression    DVT (deep venous thrombosis) (HCC) 01/25/2020   GERD (gastroesophageal reflux disease)    Heart murmur    asymptomatic   Hypertension    PONV (postoperative nausea and vomiting)    Scoliosis    reports she has rods in her back   Past Surgical History:  Procedure Laterality Date   ABDOMINAL HYSTERECTOMY     BACK SURGERY  10/2000   for scoliosis   BREAST EXCISIONAL BIOPSY Right 1990's   NEG   BROW LIFT Bilateral 07/23/2020   Procedure: BLEPHAROPLASTY UPPER EYELID; W/EXCESS SKIN BLEPHAROPTOSIS REPAIR; RESECT EX BILATERAL;  Surgeon: Imagene Riches, MD;  Location: [facility];  Service: Ophthalmology;  Laterality: Bilateral;   COLONOSCOPY WITH PROPOFOL N/A 03/23/2020   Procedure: COLONOSCOPY WITH PROPOFOL;  Surgeon: Midge Minium, MD;  Location: [facility];  Service: Endoscopy;  Laterality: N/A;  priority 4   FEMUR FRACTURE SURGERY Right 07/15/2009   Pertrochanteric femur fracture.   HIP FRACTURE  SURGERY Right    PERIPHERAL VASCULAR THROMBECTOMY Left 11/11/2019   Procedure: PERIPHERAL VASCULAR THROMBECTOMY;  Surgeon: Annice Needy, MD;  Location: [facility];  Service: Cardiovascular;  Laterality: Left;   POLYPECTOMY  03/23/2020   Procedure: POLYPECTOMY;  Surgeon: Midge Minium, MD;  Location: [facility];  Service: Endoscopy;;   REVERSE SHOULDER ARTHROPLASTY Left 07/30/2021   Procedure: Left reverse shoulder arthroplasty, biceps tenodesis;  Surgeon: Signa Kell, MD;   Location: [facility];  Service: Orthopedics;  Laterality: Left;   REVISION TOTAL HIP ARTHROPLASTY Left    Social History:   reports that she has never smoked. She has never used smokeless tobacco. She reports that she does not drink alcohol and does not use drugs.  No Known Allergies  Family History  Problem Relation Age of Onset   Osteoarthritis Mother    Lung cancer Father    Diabetes Brother    Breast cancer Neg Hx     Prior to Admission medications   Medication Sig Start Date End Date Taking? Authorizing Provider  atenolol (TENORMIN) 25 MG tablet Take 1 tablet (25 mg total) by mouth daily. Patient taking differently: Take 25 mg by mouth every morning. 10/27/20   Margaretann Loveless, PA-C  azithromycin (ZITHROMAX) 250 MG tablet Take 2 tabs today, then 1 tab daily x 4 days 07/16/22   Lorre Munroe, NP  betamethasone dipropionate 0.05 % cream Apply 1 application topically daily as needed (psoriasis).    [provider]  cephALEXin (KEFLEX) 500 MG capsule Take 1 capsule (500 mg total) by mouth 3 (three) times daily. 12/04/22   Brimage, Seward Meth, DO  DETROL 2 MG tablet Take 1 tablet (2 mg total) by mouth 2 (two) times daily. 05/18/21   Jacky Kindle, FNP  hydrocortisone cream 1 % Apply to affected area 2 times daily 12/04/22   Katha Cabal, DO  Magnesium Oxide 400 MG CAPS Take 1 capsule (400 mg total) by mouth daily. Patient not taking: Reported on 07/20/2021 05/26/21   Alford Highland, MD  meclizine (ANTIVERT) 25 MG tablet Take 25 mg by mouth 3 (three) times daily as needed for dizziness.    [provider]  omeprazole (PRILOSEC) 20 MG capsule Take 1 capsule (20 mg total) by mouth daily. Patient taking differently: Take 20 mg by mouth every morning. 10/27/20   Margaretann Loveless, PA-C  ondansetron (ZOFRAN-ODT) 4 MG disintegrating tablet Take 1 tablet (4 mg total) by mouth every 8 (eight) hours as needed for nausea or vomiting. 07/30/21   Signa Kell, MD  potassium  chloride SA (KLOR-CON) 20 MEQ tablet Take 1 tablet (20 mEq total) by mouth daily for 3 days. Patient not taking: Reported on 07/20/2021 05/26/21   Alford Highland, MD  rivaroxaban (XARELTO) 20 MG TABS tablet Take 1 tablet (20 mg total) by mouth daily with supper. 10/27/20   Margaretann Loveless, PA-C  traZODone (DESYREL) 100 MG tablet Take 1 tablet (100 mg total) by mouth at bedtime. Patient taking differently: Take 200 mg by mouth at bedtime. 10/27/20   Margaretann Loveless, PA-C  triamterene-hydrochlorothiazide (MAXZIDE-25) 37.5-25 MG tablet Take 1 tablet by mouth daily. Patient taking differently: Take 1 tablet by mouth every morning. 10/27/20   Margaretann Loveless, PA-C  venlafaxine XR (EFFEXOR-XR) 75 MG 24 hr capsule Take 1 capsule (75 mg total) by mouth daily. Patient taking differently: Take 75 mg by mouth daily with breakfast. 10/27/20   Margaretann Loveless, PA-C     Vitals:  04/03/23 1934 04/03/23 1936  BP:  127/71  Pulse:  (!) 103  Resp:  18  Temp:  99.6 F (37.6 C)  TempSrc:  Oral  SpO2:  99%  Weight: 74.8 kg   Height: 5\' 6"  (1.676 m)    Physical Exam Vitals and nursing note reviewed.  Constitutional:      General: She is not in acute distress. HENT:     Head: Normocephalic and atraumatic.     Right Ear: Hearing normal.     Left Ear: Hearing normal.     Nose: Nose normal. No nasal deformity.     Mouth/Throat:     Lips: Pink.     Tongue: No lesions.     Pharynx: Oropharynx is clear.  Eyes:     General: Lids are normal.     Extraocular Movements: Extraocular movements intact.  Cardiovascular:     Rate and Rhythm: Normal rate and regular rhythm.     Heart sounds: Normal heart sounds.  Pulmonary:     Effort: Pulmonary effort is normal.     Breath sounds: Normal breath sounds.  Abdominal:     General: Abdomen is protuberant. Bowel sounds are normal.     Palpations: Abdomen is soft. There is no mass.     Tenderness: There is abdominal tenderness. There is left CVA  tenderness and guarding.    Musculoskeletal:        General: Tenderness present.     Right lower leg: No edema.     Left lower leg: No edema.  Skin:    General: Skin is warm.  Neurological:     General: No focal deficit present.     Mental Status: She is alert and oriented to person, place, and time.     Cranial Nerves: Cranial nerves 2-12 are intact.  Psychiatric:        Attention and Perception: Attention normal.        Mood and Affect: Mood normal.        Speech: Speech normal.        Behavior: Behavior normal. Behavior is cooperative.      Labs on Admission: I have personally reviewed following labs and imaging studies  CBC: Recent Labs  Lab 04/03/23 1937  WBC 17.1*  NEUTROABS 14.2*  HGB 13.5  HCT 41.9  MCV 87.5  PLT 178   Basic Metabolic Panel: Recent Labs  Lab 04/03/23 1937  NA 132*  K 3.1*  CL 96*  CO2 24  GLUCOSE 141*  BUN 23  CREATININE 1.23*  CALCIUM 8.9   GFR: Estimated Creatinine Clearance: 45.9 mL/min (A) (by C-G formula based on SCr of 1.23 mg/dL (H)). Liver Function Tests: Recent Labs  Lab 04/03/23 1937  AST 29  ALT 26  ALKPHOS 143*  BILITOT 1.0  PROT 7.3  ALBUMIN 4.0   No results for input(s): "LIPASE", "AMYLASE" in the last 168 hours. No results for input(s): "AMMONIA" in the last 168 hours. Coagulation Profile: No results for input(s): "INR", "PROTIME" in the last 168 hours. Cardiac Enzymes: No results for input(s): "CKTOTAL", "CKMB", "CKMBINDEX", "TROPONINI" in the last 168 hours. BNP (last 3 results) No results for input(s): "PROBNP" in the last 8760 hours. HbA1C: No results for input(s): "HGBA1C" in the last 72 hours. CBG: No results for input(s): "GLUCAP" in the last 168 hours. Lipid Profile: No results for input(s): "CHOL", "HDL", "LDLCALC", "TRIG", "CHOLHDL", "LDLDIRECT" in the last 72 hours. Thyroid Function Tests: No results for input(s): "TSH", "T4TOTAL", "FREET4", "T3FREE", "  THYROIDAB" in the last 72 hours. Anemia  Panel: No results for input(s): "VITAMINB12", "FOLATE", "FERRITIN", "TIBC", "IRON", "RETICCTPCT" in the last 72 hours. Urinalysis    Component Value Date/Time   COLORURINE YELLOW (A) 04/03/2023 2134   APPEARANCEUR CLOUDY (A) 04/03/2023 2134   APPEARANCEUR Cloudy 10/25/2011 1308   LABSPEC 1.016 04/03/2023 2134   LABSPEC 1.015 10/25/2011 1308   PHURINE 5.0 04/03/2023 2134   GLUCOSEU NEGATIVE 04/03/2023 2134   GLUCOSEU Negative 10/25/2011 1308   HGBUR NEGATIVE 04/03/2023 2134   BILIRUBINUR NEGATIVE 04/03/2023 2134   BILIRUBINUR neg 10/06/2015 1521   BILIRUBINUR Negative 10/25/2011 1308   KETONESUR NEGATIVE 04/03/2023 2134   PROTEINUR 30 (A) 04/03/2023 2134   UROBILINOGEN 0.2 10/06/2015 1521   NITRITE NEGATIVE 04/03/2023 2134   LEUKOCYTESUR LARGE (A) 04/03/2023 2134   LEUKOCYTESUR Negative 10/25/2011 1308   Unresulted Labs (From admission, onward)     Start     Ordered   04/04/23 0500  Comprehensive metabolic panel  Tomorrow morning,   R        04/04/23 0133   04/04/23 0500  CBC  Tomorrow morning,   R        04/04/23 0133   04/04/23 0134  Hemoglobin A1c  Add-on,   AD        04/04/23 0133   04/04/23 0132  HIV Antibody (routine testing w rflx)  (HIV Antibody (Routine testing w reflex) panel)  Once,   R        04/04/23 0133           Medications  cefTRIAXone (ROCEPHIN) 1 g in sodium chloride 0.9 % 100 mL IVPB (has no administration in time range)  HYDROmorphone (DILAUDID) injection 1 mg (has no administration in time range)  atenolol (TENORMIN) tablet 25 mg (has no administration in time range)  fesoterodine (TOVIAZ) tablet 4 mg (has no administration in time range)  rivaroxaban (XARELTO) tablet 20 mg (has no administration in time range)  venlafaxine XR (EFFEXOR-XR) 24 hr capsule 75 mg (has no administration in time range)  traZODone (DESYREL) tablet 200 mg (has no administration in time range)  ondansetron (ZOFRAN-ODT) disintegrating tablet 4 mg (has no administration in  time range)  sodium chloride flush (NS) 0.9 % injection 3 mL (has no administration in time range)  acetaminophen (TYLENOL) tablet 650 mg (has no administration in time range)    Or  acetaminophen (TYLENOL) suppository 650 mg (has no administration in time range)  lactated ringers infusion (has no administration in time range)  fentaNYL (SUBLIMAZE) injection 100 mcg (100 mcg Intramuscular Given 04/03/23 2238)  ondansetron (ZOFRAN-ODT) disintegrating tablet 8 mg (8 mg Oral Given 04/03/23 2242)  HYDROmorphone (DILAUDID) injection 1 mg (1 mg Intravenous Given 04/04/23 0027)  cefTRIAXone (ROCEPHIN) 1 g in sodium chloride 0.9 % 100 mL IVPB (0 g Intravenous Stopped 04/04/23 0118)  sodium chloride 0.9 % bolus 1,000 mL (0 mLs Intravenous Stopped 04/04/23 0118)  iohexol (OMNIPAQUE) 300 MG/ML solution 100 mL (100 mLs Intravenous Contrast Given 04/04/23 0119)    Radiological Exams on Admission: CT PELVIS WO CONTRAST  Result Date: 04/03/2023 CLINICAL DATA:  Pelvis pain, stress fracture suspected, no prior imaging EXAM: CT PELVIS WITHOUT CONTRAST TECHNIQUE: Multidetector CT imaging of the pelvis was performed following the standard protocol without intravenous contrast. RADIATION DOSE REDUCTION: This exam was performed according to the departmental dose-optimization program which includes automated exposure control, adjustment of the mA and/or kV according to patient size and/or use of iterative reconstruction technique. COMPARISON:  None  Available. FINDINGS: Urinary Tract:  No abnormality visualized. Bowel:  Unremarkable visualized pelvic bowel loops. Vascular/Lymphatic: No pathologically enlarged lymph nodes. No significant vascular abnormality seen. Reproductive:  No mass or other significant abnormality Other: No intraperitoneal free fluid. No intraperitoneal free gas. No organized fluid collection. Musculoskeletal: Limited evaluation due to streak artifact. Lumbosacral posterior and interbody surgical hardware  partially visualized. Total bilateral hip arthroplasty. No CT findings suggest surgical hardware complication. Diffusely decreased bone density. No periprostatic acute fracture along the bilateral hip surgical hardware. No acute displaced fracture or diastasis of the bones of the pelvis. Old healed right inferior and superior pubic rami fractures. Poorly visualized slight periosteal reaction along the pubic symphysis (7:30) that may represent a subacute fracture. Other: None. IMPRESSION: 1. Negative for acute traumatic injury. 2. Poorly visualized slight periosteal reaction along the pubic symphysis that may represent a subacute fracture. Limited evaluation due to streak artifact. Finding would likely be better visualized on radiograph. Electronically Signed   By: Tish Frederickson M.D.   On: 04/03/2023 23:40   CT Lumbar Spine Wo Contrast  Result Date: 04/03/2023 CLINICAL DATA:  Low back pain, cauda equina syndrome suspected. Left groin and leg pain. EXAM: CT LUMBAR SPINE WITHOUT CONTRAST TECHNIQUE: Multidetector CT imaging of the lumbar spine was performed without intravenous contrast administration. Multiplanar CT image reconstructions were also generated. RADIATION DOSE REDUCTION: This exam was performed according to the departmental dose-optimization program which includes automated exposure control, adjustment of the mA and/or kV according to patient size and/or use of iterative reconstruction technique. COMPARISON:  None Available. FINDINGS: Segmentation: 5 lumbar type vertebrae. Alignment: Severe convex leftward scoliosis.  No subluxation. Vertebrae: Diffuse severe osteopenia. No visible fracture or focal bone lesion. Paraspinal and other soft tissues: Negative Disc levels: Partial fusion across the disc spaces. Diffuse degenerative disc changes. Posterior spinal rods and fusion changes throughout the visualized lower thoracic and lumbar spine into the sacrum. IMPRESSION: Severe convex leftward scoliosis.  Severe osteopenia. No acute bony abnormality. Electronically Signed   By: Charlett Nose M.D.   On: 04/03/2023 23:34     Data Reviewed: Relevant notes from primary care and specialist visits, past discharge summaries as available in EHR, including Care Everywhere. Prior diagnostic testing as pertinent to current admission diagnoses Updated medications and problem lists for reconciliation ED course, including vitals, labs, imaging, treatment and response to treatment Triage notes, nursing and pharmacy notes and ED provider's notes Notable results as noted in HPI  Assessment and Plan: * Left lower quadrant abdominal pain Started this morning while waking up.no back pain.  Spouse helped her up and could not bear weight.  D/d include UTI/pyelo/nephrolithiasis, referred left sided back pain, left hip pain, GI or GU.  We will get ct abd and pelvis with contrast.  We will get lactic acid level do no suspect any mesenteric ischemia, le pulses are 2+/ DP/TB. Pt has not associated symptoms with her left sided pain.  D/W her about contrast CT and then continue additional testing as needed. We will proceed with ct scan and MRI of pelvis.    Iron deficiency anemia due to chronic blood loss Resolved.  Pt sees Dr.kowalski and [facility].   Hypokalemia Will replace and follow.    Hypomagnesemia Will check levels and manage.  AKI (acute kidney injury) (HCC) Mild AKI, will follow.  Lab Results  Component Value Date   CREATININE 1.23 (H) 04/03/2023   CREATININE 0.82 07/22/2021   CREATININE 0.88 05/26/2021  Start pt on LR 30 CC/  hour x 24 hours as we are giving her contrast for her ct.    DVT (deep venous thrombosis) (HCC) Pt has factor 5 and takes xarelto .  Gastro-esophageal reflux disease without esophagitis IV PPI.  Aspiration precaution.  Essential hypertension Vitals:   04/03/23 1936  BP: 127/71  Continue atenolol and hold hydrochlorothiazide.     Abnormal LFTs    Latest Ref  Rng & Units 04/03/2023    7:37 PM 07/22/2021   11:20 AM 05/26/2021    4:42 AM  CMP  Glucose 70 - 99 mg/dL 161  87  096   BUN 8 - 23 mg/dL 23  16  20    Creatinine 0.44 - 1.00 mg/dL 0.45  4.09  8.11   Sodium 135 - 145 mmol/L 132  140  138   Potassium 3.5 - 5.1 mmol/L 3.1  3.5  3.5   Chloride 98 - 111 mmol/L 96  106  106   CO2 22 - 32 mmol/L 24  26  25    Calcium 8.9 - 10.3 mg/dL 8.9  8.8  7.7   Total Protein 6.5 - 8.1 g/dL 7.3  7.2    Total Bilirubin 0.3 - 1.2 mg/dL 1.0  0.4    Alkaline Phos 38 - 126 U/L 143  155    AST 15 - 41 U/L 29  22    ALT 0 - 44 U/L 26  21    Elevated alk phos suspect from osteoporosis.  We will get ggt and follow.     DVT prophylaxis:  Xarelto   Consults:  None   Advance Care Planning:    Code Status: Full Code   Family Communication:  Daughter at bedside.   Disposition Plan:  Home.  Severity of Illness: The appropriate patient status for this patient is INPATIENT. Inpatient status is judged to be reasonable and necessary in order to provide the required intensity of service to ensure the patient's safety. The patient's presenting symptoms, physical exam findings, and initial radiographic and laboratory data in the context of their chronic comorbidities is felt to place them at high risk for further clinical deterioration. Furthermore, it is not anticipated that the patient will be medically stable for discharge from the hospital within 2 midnights of admission.   * I certify that at the point of admission it is my clinical judgment that the patient will require inpatient hospital care spanning beyond 2 midnights from the point of admission due to high intensity of service, high risk for further deterioration and high frequency of surveillance required.*  Author: Gertha Calkin, MD 04/04/2023 1:43 AM  For on call review [email or web address].

## 2023-04-04 NOTE — Assessment & Plan Note (Signed)
Vitals:   04/03/23 1936  BP: 127/71  Continue atenolol and hold hydrochlorothiazide.

## 2023-04-04 NOTE — Assessment & Plan Note (Signed)
    Latest Ref Rng & Units 04/03/2023    7:37 PM 07/22/2021   11:20 AM 05/26/2021    4:42 AM  CMP  Glucose 70 - 99 mg/dL 161  87  096   BUN 8 - 23 mg/dL 23  16  20    Creatinine 0.44 - 1.00 mg/dL 0.45  4.09  8.11   Sodium 135 - 145 mmol/L 132  140  138   Potassium 3.5 - 5.1 mmol/L 3.1  3.5  3.5   Chloride 98 - 111 mmol/L 96  106  106   CO2 22 - 32 mmol/L 24  26  25    Calcium 8.9 - 10.3 mg/dL 8.9  8.8  7.7   Total Protein 6.5 - 8.1 g/dL 7.3  7.2    Total Bilirubin 0.3 - 1.2 mg/dL 1.0  0.4    Alkaline Phos 38 - 126 U/L 143  155    AST 15 - 41 U/L 29  22    ALT 0 - 44 U/L 26  21    Elevated alk phos suspect from osteoporosis.  We will get ggt and follow.

## 2023-04-04 NOTE — Assessment & Plan Note (Signed)
Will check levels and manage.

## 2023-04-05 DIAGNOSIS — R1032 Left lower quadrant pain: Secondary | ICD-10-CM | POA: Diagnosis not present

## 2023-04-05 LAB — CBC WITH DIFFERENTIAL/PLATELET
Abs Immature Granulocytes: 0.17 10*3/uL — ABNORMAL HIGH (ref 0.00–0.07)
Basophils Absolute: 0 10*3/uL (ref 0.0–0.1)
Basophils Relative: 0 %
Eosinophils Absolute: 0.3 10*3/uL (ref 0.0–0.5)
Eosinophils Relative: 2 %
HCT: 36.5 % (ref 36.0–46.0)
Hemoglobin: 12.3 g/dL (ref 12.0–15.0)
Immature Granulocytes: 1 %
Lymphocytes Relative: 2 %
Lymphs Abs: 0.3 10*3/uL — ABNORMAL LOW (ref 0.7–4.0)
MCH: 28.9 pg (ref 26.0–34.0)
MCHC: 33.7 g/dL (ref 30.0–36.0)
MCV: 85.7 fL (ref 80.0–100.0)
Monocytes Absolute: 0.6 10*3/uL (ref 0.1–1.0)
Monocytes Relative: 5 %
Neutro Abs: 10.8 10*3/uL — ABNORMAL HIGH (ref 1.7–7.7)
Neutrophils Relative %: 90 %
Platelets: 129 10*3/uL — ABNORMAL LOW (ref 150–400)
RBC: 4.26 MIL/uL (ref 3.87–5.11)
RDW: 16.2 % — ABNORMAL HIGH (ref 11.5–15.5)
WBC: 12.1 10*3/uL — ABNORMAL HIGH (ref 4.0–10.5)
nRBC: 0 % (ref 0.0–0.2)

## 2023-04-05 LAB — BASIC METABOLIC PANEL
Anion gap: 4 — ABNORMAL LOW (ref 5–15)
BUN: 25 mg/dL — ABNORMAL HIGH (ref 8–23)
CO2: 25 mmol/L (ref 22–32)
Calcium: 8.1 mg/dL — ABNORMAL LOW (ref 8.9–10.3)
Chloride: 106 mmol/L (ref 98–111)
Creatinine, Ser: 1.19 mg/dL — ABNORMAL HIGH (ref 0.44–1.00)
GFR, Estimated: 50 mL/min — ABNORMAL LOW (ref >60)
Glucose, Bld: 169 mg/dL — ABNORMAL HIGH (ref 70–99)
Potassium: 4.9 mmol/L (ref 3.5–5.1)
Sodium: 135 mmol/L (ref 135–145)

## 2023-04-05 MED ORDER — PREGABALIN 75 MG PO CAPS
75.0000 mg | ORAL_CAPSULE | Freq: Three times a day (TID) | ORAL | Status: DC
  Administered 2023-04-05 – 2023-04-10 (×17): 75 mg via ORAL
  Filled 2023-04-05 (×17): qty 1

## 2023-04-05 MED ORDER — OXYCODONE HCL 5 MG PO TABS
10.0000 mg | ORAL_TABLET | Freq: Four times a day (QID) | ORAL | Status: DC | PRN
  Administered 2023-04-05 – 2023-04-10 (×14): 10 mg via ORAL
  Filled 2023-04-05 (×14): qty 2

## 2023-04-05 MED ORDER — HYDROMORPHONE HCL 1 MG/ML IJ SOLN
1.0000 mg | INTRAMUSCULAR | Status: DC | PRN
  Administered 2023-04-05: 1 mg via INTRAVENOUS
  Filled 2023-04-05 (×2): qty 1

## 2023-04-05 MED ORDER — MORPHINE SULFATE (PF) 2 MG/ML IV SOLN
2.0000 mg | INTRAVENOUS | Status: DC | PRN
  Administered 2023-04-05 – 2023-04-06 (×2): 2 mg via INTRAVENOUS
  Filled 2023-04-05 (×2): qty 1

## 2023-04-05 MED ORDER — METOPROLOL SUCCINATE ER 25 MG PO TB24
12.5000 mg | ORAL_TABLET | Freq: Every day | ORAL | Status: DC
  Administered 2023-04-06 – 2023-04-07 (×2): 12.5 mg via ORAL
  Filled 2023-04-05 (×2): qty 1

## 2023-04-05 NOTE — Progress Notes (Signed)
Physical Therapy Treatment Patient Details Name: Kristi Lee MRN: [id number] DOB: [birth year] Today's Date: 04/05/2023   History of Present Illness Pt is a 67 y.o. female presenting to hospital 04/03/23 with c/o sharp pain to L hip and L groin region (woke up with; no reported trauma).  Imaging showing severe convex leftward scoliosis; significant inflammation/edema and fluid involving the left iliacus muscle suggesting a muscle tear. The iliopsoas tendon appears to be intact.  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  Pt admitted with LLQ abdominal pain secondary to iliacus muscle tear, hypokalemia, and AKI.  PMH includes SOB, htn, depression, OA, anemia, L reverse shoulder arthroplasty.    PT Comments  Pt was long sitting in bed upon arrival. She was pre-medicated prior to session but still presents with pain with all mobility/transfers, and gait. " Do I have a fracture or a muscle tear?" Author did best to describe injuries list in chart. Will benefit form hearing from MD versus author. PT puts forth great effort but remains limited. She was able to tolerate getting OOB, standing to RW, and ambulating ~ 8 ft to recliner. No LOB however extremely slow antalgic step to gait pattern. Pt did not require use of RW or assistance at baseline. Highly recommend continued skilled PT going forward to maximize independence and safety with all ADLs. Acute PT will continue to follow per current POC. DC recs remain appropriate.    If plan is discharge home, recommend the following: A lot of help with walking and/or transfers;A lot of help with bathing/dressing/bathroom;Assistance with cooking/housework;Direct supervision/assist for medications management;Direct supervision/assist for financial management;Assist for transportation;Help with stairs or ramp for entrance     Equipment Recommendations  Other  (comment) (Defer to next level of care)       Precautions / Restrictions Precautions Precautions: Fall Restrictions Weight Bearing Restrictions: No Other Position/Activity Restrictions: L iliacus muscle tear     Mobility  Bed Mobility Overal bed mobility: Needs Assistance Bed Mobility: Supine to Sit  Supine to sit: Mod assist, HOB elevated  General bed mobility comments: Pt is very slow moving due to pain but was able to achieve EOB sitting with +1 assistance. Pt endorses only 1-10 pain however presents with facial grimacing and poor overall tolerance.    Transfers Overall transfer level: Needs assistance Equipment used: Rolling walker (2 wheels) Transfers: Sit to/from Stand Sit to Stand: Min assist, Mod assist  General transfer comment: Pt was able to stand form slightly elevated bed height with min-mod assist of one. She tends to limit wt on LLE but was able to advance away from EOB. Very slow moving overall due to pain even with pt only endorsing 1/10 pain    Ambulation/Gait Ambulation/Gait assistance: Min assist Gait Distance (Feet): 8 Feet Assistive device: Rolling walker (2 wheels) Gait Pattern/deviations: Step-to pattern, Antalgic, Trunk flexed Gait velocity: decreased  General Gait Details: Pt was able to ambulate ~ 8 ft in room with RW + slow step too gait pattern. KNees slightly flexed but with vcs is able to erect posture and extend knees. very reliant on RW for dynamic standing balance/activity    Balance Overall balance assessment: Needs assistance Sitting-balance support: No upper extremity supported, Feet supported Sitting balance-Leahy Scale: Good Sitting balance - Comments: no LOB while sitting EOB   Standing balance support: Reliant on assistive device for balance, During functional activity, Bilateral upper extremity supported Standing balance-Leahy  Scale: Fair Standing balance comment: Pt requires extensive/heavy BUE support on RW for all standing  activity. poor wt acceptance on LLE due to pain however was able to advance away from EOB with BUE support       Cognition Arousal: Alert Behavior During Therapy: WFL for tasks assessed/performed Overall Cognitive Status: Within Functional Limits for tasks assessed    General Comments: Pt is A and O x 4. pleasant and cooperative               Pertinent Vitals/Pain Pain Assessment Pain Assessment: 0-10 Pain Score: 1  Pain Location: abdominal/L groin pain Pain Descriptors / Indicators: Sharp, Grimacing, Guarding Pain Intervention(s): Limited activity within patient's tolerance, Monitored during session, Premedicated before session, Repositioned     PT Goals (current goals can now be found in the care plan section) Acute Rehab PT Goals Patient Stated Goal: to improve pain and mobility Progress towards PT goals: Progressing toward goals    Frequency    Min 1X/week       AM-PAC PT "6 Clicks" Mobility   Outcome Measure  Help needed turning from your back to your side while in a flat bed without using bedrails?: A Lot Help needed moving from lying on your back to sitting on the side of a flat bed without using bedrails?: A Lot Help needed moving to and from a bed to a chair (including a wheelchair)?: A Lot Help needed standing up from a chair using your arms (e.g., wheelchair or bedside chair)?: A Lot Help needed to walk in hospital room?: A Lot Help needed climbing 3-5 steps with a railing? : A Lot 6 Click Score: 12    End of Session   Activity Tolerance: Patient tolerated treatment well;Patient limited by pain Patient left: in chair;with call bell/phone within reach;with chair alarm set;with family/visitor present;Other (comment) (purwick) Nurse Communication: Mobility status;Precautions PT Visit Diagnosis: Other abnormalities of gait and mobility (R26.89);Muscle weakness (generalized) (M62.81);History of falling (Z91.81);Pain Pain - Right/Left: Left     Time:  1191-4782 PT Time Calculation (min) (ACUTE ONLY): 25 min  Charges:    $Gait Training: 8-22 mins $Therapeutic Activity: 8-22 mins PT General Charges $$ ACUTE PT VISIT: 1 Visit                     Jetta Lout PTA 04/05/23, 11:34 AM

## 2023-04-05 NOTE — Progress Notes (Signed)
   04/05/23 2029  Assess: MEWS Score  Temp (!) 101.3 F (38.5 C)  BP 110/85  MAP (mmHg) 93  Pulse Rate (!) 111  Resp 18  SpO2 100 %  Assess: MEWS Score  MEWS Temp 1  MEWS Systolic 0  MEWS Pulse 2  MEWS RR 0  MEWS LOC 0  MEWS Score 3  MEWS Score Color Yellow  Assess: if the MEWS score is Yellow or Red  Were vital signs accurate and taken at a resting state? Yes  Does the patient meet 2 or more of the SIRS criteria? Yes  Does the patient have a confirmed or suspected source of infection? Yes  MEWS guidelines implemented  Yes, yellow  Treat  MEWS Interventions Considered administering scheduled or prn medications/treatments as ordered  Take Vital Signs  Increase Vital Sign Frequency  Yellow: Q2hr x1, continue Q4hrs until patient remains green for 12hrs  Escalate  MEWS: Escalate Yellow: Discuss with charge nurse and consider notifying provider and/or RRT  Notify: Charge Nurse/RN  Name of Charge Nurse/RN Notified Gerda Diss, RN  Provider Notification  Provider Name/Title Lindajo Royal, MD  Date Provider Notified 04/05/23  Time Provider Notified 2040  Method of Notification Page (Secure Chat)  Notification Reason Change in status (Elevated temp - 101.3)  Provider response No new orders  Date of Provider Response 04/05/23  Time of Provider Response 2045  Assess: SIRS CRITERIA  SIRS Temperature  1  SIRS Pulse 1  SIRS Respirations  0  SIRS WBC 0  SIRS Score Sum  2

## 2023-04-05 NOTE — Progress Notes (Signed)
Occupational Therapy Treatment Patient Details Name: Kristi Lee MRN: [id number] DOB: Jun 11, 1956 Today's Date: 04/05/2023   History of present illness Pt is a 67 y.o. female presenting to hospital 04/03/23 with c/o sharp pain to L hip and L groin region (woke up with; no reported trauma).  Imaging showing severe convex leftward scoliosis; significant inflammation/edema and fluid involving the left iliacus muscle suggesting a muscle tear. The iliopsoas tendon appears to be intact.  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  Pt admitted with LLQ abdominal pain secondary to iliacus muscle tear, hypokalemia, and AKI.  PMH includes SOB, htn, depression, OA, anemia, L reverse shoulder arthroplasty.   OT comments  Nursing request to hold OOB activity until successful pain management regimen can be established.  Pt. Required ModA-MaxAx2  for bed mobility to the [facility]. Pt. Requires MaxA LE ADLs. Pt. education was provided about A/E use for LE ADLs, however Pt. Reports having the A/E, and being very familiar with using it following history of multiple surgeries. Pt. Was agreeable to BUE there. Ex for strengthening with red theraband for left shoulder flexion, abduction, and bilateral elbow flexion, and extension. (History of right shoulder limitation). There. Ex was performed to increase UE strength needed for functional mobility, and ADLs Pt. Continues to  benefit from OT services for ADL training, A/E training, UE there. Ex.,and pt. Education about home modification, and DME.        If plan is discharge home, recommend the following:  Two people to help with walking and/or transfers;A lot of help with bathing/dressing/bathroom;Assistance with cooking/housework;Help with stairs or ramp for entrance;Assist for transportation   Equipment Recommendations       Recommendations for Other Services       Precautions / Restrictions Precautions Precautions: Fall Restrictions Weight Bearing Restrictions: No Other Position/Activity Restrictions: L iliacus muscle tear       Mobility Bed Mobility Overal bed mobility: Needs Assistance Bed Mobility:  (Repositioning to the [facility])     Supine to sit: Mod-Max assist, +2 for physical assistance          Transfers                   General transfer comment: Deferred per nursing request     Balance                                           ADL either performed or assessed with clinical judgement   ADL                       Lower Body Dressing: Maximal assistance                      Extremity/Trunk Assessment Upper Extremity Assessment Upper Extremity Assessment: Overall WFL for tasks assessed            Vision Patient Visual Report: No change from baseline     Perception     Praxis      Cognition Arousal: Alert Behavior During Therapy: WFL for tasks assessed/performed Overall Cognitive Status: Within Functional Limits for tasks assessed  Exercises      Shoulder Instructions       General Comments      Pertinent Vitals/ Pain       Pain Assessment Pain Assessment: 0-10 Pain Score: 6  Pain Location: LLQ Pain Pain Descriptors / Indicators: Sharp, Grimacing, Guarding Pain Intervention(s): Limited activity within patient's tolerance, Monitored during session (Nursing aware of Pt. pain reports)  Home Living                                          Prior Functioning/Environment              Frequency  Min 1X/week        Progress Toward Goals  OT Goals(current goals can now be found in the care plan section)  Progress towards OT goals:  (Pain is impacting progress. Nursing request to hold OOB activity.)  Acute Rehab OT Goals Patient Stated Goal: To decrease pain OT Goal  Formulation: With patient Time For Goal Achievement: 04/18/23 Potential to Achieve Goals: Good  Plan      Co-evaluation                 AM-PAC OT "6 Clicks" Daily Activity     Outcome Measure   Help from another person eating meals?: None Help from another person taking care of personal grooming?: None Help from another person toileting, which includes using toliet, bedpan, or urinal?: A Lot Help from another person bathing (including washing, rinsing, drying)?: A Lot Help from another person to put on and taking off regular upper body clothing?: None Help from another person to put on and taking off regular lower body clothing?: A Lot 6 Click Score: 18    End of Session    OT Visit Diagnosis: Muscle weakness (generalized) (M62.81) Pain - Right/Left: Left Pain - part of body: Leg   Activity Tolerance Patient tolerated treatment well;Patient limited by pain   Patient Left with bed alarm set;in bed;with call bell/phone within reach;with family/visitor present   Nurse Communication          Time: 0940-1005 OT Time Calculation (min): 25 min  Charges: OT General Charges $OT Visit: 1 Visit OT Treatments $Self Care/Home Management : 23-37 mins Olegario Messier, MS, OTR/L  Olegario Messier 04/05/2023, 10:48 AM

## 2023-04-05 NOTE — Progress Notes (Signed)
PROGRESS NOTE    Kristi Lee   WJX:[id number] DOB: [birth year]  DOA: 04/03/2023 Date of Service: 04/05/23 PCP: [facility]     Brief Narrative / Hospital Course:  Kristi Lee is a 67 y.o. female with medical history significant for scoliosis status postsurgery, congestive heart failure diastolic, DVT, GERD, hypertension presenting to ED 08/19 with left-sided groin pain. Patient was getting out of bed this morning and felt that sharp pain in the left pelvis left flank left hip area patient states the pain was 10 out of 10 it was so bad that she started to ice it she was not able to move out of her bed 08/19: in ED, CT imaging limited, question pubic rami fx, admitted to hospitalist service and MRI ordered 08/20: on MRI hip/pelvis, "Significant inflammation/edema and fluid involving the left iliacus muscle suggesting a muscle tear. The iliopsoas tendon appears to be intact." Per ortho, no surgical intervention needed 08/21: workign w/ PT/OT, recs for SNF, pain control has been challenging, trial IV morphine for breakthrough. TOC consult for SNF placement rehab.   Consultants:  Curbsied to Orthopedics as below, no formal consult at this time   Procedures: None       ASSESSMENT & PLAN:   Principal Problem:   Left lower quadrant abdominal pain Active Problems:   Abnormal LFTs   Essential hypertension   Gastro-esophageal reflux disease without esophagitis   DVT (deep venous thrombosis) (HCC)   AKI (acute kidney injury) (HCC)   Hypomagnesemia   Hypokalemia   Iron deficiency anemia due to chronic blood loss   Left flank pain  Left lower quadrant abdominal pain and groin pain secondary to iliacus muscle tear Dr Meriam Sprague (hospitalist) discussed the MRI findings with Dr. Leron Croak orthopedic surgeon --> patient does not require surgery and will need physical therapy and pain management. PT OT consulted Monitor H&H closely as patient is on Xarelto  in  the setting of MRI findings.   Iron deficiency anemia due to chronic blood loss Resolved.  Pt sees Dr.kowalski and [facility].    Hypokalemia Replace as needed Monitor BMP   Hypovolemic hyponatremia Monitor BMP   Hypomagnesemia Will check levels and manage.   UTI Rocephin Await cultures   AKI (acute kidney injury) (HCC) Patient with baseline creatinine within normal limits of 0.8 however presented with creatinine of 1.2 S/p IV fluids Monitor renal function closely Avoid nephrotoxic agents   DVT (deep venous thrombosis) (HCC) Factor V Leiden Continue Xarelto   Gastro-esophageal reflux disease without esophagitis PPI.  Aspiration precaution.   Essential hypertension Continue hydrochlorothiazide and metoprolol Monitor blood pressure closely     DVT prophylaxis: xarelto Pertinent IV fluids/nutrition: no continuous IV fluids  Central lines / invasive devices: none  Code Status: FULL CODE ACP documentation reviewed: none on file   Current Admission Status: inpatient   TOC needs / Dispo plan: SNF rehab Barriers to discharge / significant pending items: pain control IV meds still required today, placment per TOC              Subjective / Brief ROS:  Patient reports pain is pretty bad any time trying to move her hip Denies CP/SOB.  Denies new weakness.  Tolerating diet.  Reports no concerns w/ urination/defecation.   Family Communication: none at this time     Objective Findings:  Vitals:   04/04/23 1218 04/04/23 1300 04/04/23 2114 04/05/23 0457  BP: (!) 90/54 113/64 119/71 109/65  Pulse: 90 89 (!) 110  97  Resp: 17 17 20 19   Temp: 98.5 F (36.9 C) 99.4 F (37.4 C) 99.2 F (37.3 C) 97.9 F (36.6 C)  TempSrc: Oral Oral Oral Oral  SpO2: 97% 98% 98% 100%  Weight:    76.7 kg  Height:        Intake/Output Summary (Last 24 hours) at 04/05/2023 1551 Last data filed at 04/05/2023 8413 Gross per 24 hour  Intake 480 ml  Output 1100 ml  Net -620 ml    Filed Weights   04/03/23 1934 04/05/23 0457  Weight: 74.8 kg 76.7 kg    Examination:  Physical Exam Constitutional:      General: She is not in acute distress.    Appearance: Normal appearance.  Cardiovascular:     Rate and Rhythm: Normal rate and regular rhythm.  Pulmonary:     Effort: Pulmonary effort is normal.     Breath sounds: Normal breath sounds.  Abdominal:     Palpations: Abdomen is soft.  Musculoskeletal:     Right lower leg: No edema.     Left lower leg: No edema.  Skin:    General: Skin is warm and dry.  Neurological:     General: No focal deficit present.     Mental Status: She is alert and oriented to person, place, and time.  Psychiatric:        Mood and Affect: Mood normal.        Behavior: Behavior normal.          Scheduled Medications:   docusate sodium  100 mg Oral Daily   fesoterodine  4 mg Oral Daily   [START ON 04/06/2023] metoprolol succinate  12.5 mg Oral Daily   pantoprazole  40 mg Oral Daily   polyethylene glycol  17 g Oral Daily   pregabalin  75 mg Oral TID   rivaroxaban  20 mg Oral Q supper   sodium chloride flush  3 mL Intravenous Q12H   venlafaxine XR  75 mg Oral Q breakfast    Continuous Infusions:  cefTRIAXone (ROCEPHIN)  IV 1 g (04/04/23 2103)    PRN Medications:  acetaminophen **OR** acetaminophen, morphine injection, ondansetron, oxyCODONE, traZODone  Antimicrobials from admission:  Anti-infectives (From admission, onward)    Start     Dose/Rate Route Frequency Ordered Stop   04/04/23 2200  cefTRIAXone (ROCEPHIN) 1 g in sodium chloride 0.9 % 100 mL IVPB        1 g 200 mL/hr over 30 Minutes Intravenous Every 24 hours 04/04/23 0133     04/04/23 0015  cefTRIAXone (ROCEPHIN) 1 g in sodium chloride 0.9 % 100 mL IVPB        1 g 200 mL/hr over 30 Minutes Intravenous  Once 04/04/23 0010 04/04/23 0118           Data Reviewed:  I have personally reviewed the following...  CBC: Recent Labs  Lab 04/03/23 1937  04/04/23 0717 04/05/23 0409  WBC 17.1* 12.8* 12.1*  NEUTROABS 14.2*  --  10.8*  HGB 13.5 12.2 12.3  HCT 41.9 37.0 36.5  MCV 87.5 87.1 85.7  PLT 178 131* 129*   Basic Metabolic Panel: Recent Labs  Lab 04/03/23 1937 04/04/23 0717 04/05/23 0409  NA 132* 133* 135  K 3.1* 3.0* 4.9  CL 96* 101 106  CO2 24 24 25   GLUCOSE 141* 141* 169*  BUN 23 22 25*  CREATININE 1.23* 1.04* 1.19*  CALCIUM 8.9 8.1* 8.1*   GFR: Estimated Creatinine Clearance: 48 mL/min (  A) (by C-G formula based on SCr of 1.19 mg/dL (H)). Liver Function Tests: Recent Labs  Lab 04/03/23 1937 04/04/23 0717  AST 29 25  ALT 26 24  ALKPHOS 143* 121  BILITOT 1.0 0.9  PROT 7.3 6.6  ALBUMIN 4.0 3.3*   No results for input(s): "LIPASE", "AMYLASE" in the last 168 hours. No results for input(s): "AMMONIA" in the last 168 hours. Coagulation Profile: No results for input(s): "INR", "PROTIME" in the last 168 hours. Cardiac Enzymes: No results for input(s): "CKTOTAL", "CKMB", "CKMBINDEX", "TROPONINI" in the last 168 hours. BNP (last 3 results) No results for input(s): "PROBNP" in the last 8760 hours. HbA1C: Recent Labs    04/03/23 1937  HGBA1C 6.3*   CBG: No results for input(s): "GLUCAP" in the last 168 hours. Lipid Profile: No results for input(s): "CHOL", "HDL", "LDLCALC", "TRIG", "CHOLHDL", "LDLDIRECT" in the last 72 hours. Thyroid Function Tests: No results for input(s): "TSH", "T4TOTAL", "FREET4", "T3FREE", "THYROIDAB" in the last 72 hours. Anemia Panel: No results for input(s): "VITAMINB12", "FOLATE", "FERRITIN", "TIBC", "IRON", "RETICCTPCT" in the last 72 hours. Most Recent Urinalysis On File:     Component Value Date/Time   COLORURINE YELLOW (A) 04/03/2023 2134   APPEARANCEUR CLOUDY (A) 04/03/2023 2134   APPEARANCEUR Cloudy 10/25/2011 1308   LABSPEC 1.016 04/03/2023 2134   LABSPEC 1.015 10/25/2011 1308   PHURINE 5.0 04/03/2023 2134   GLUCOSEU NEGATIVE 04/03/2023 2134   GLUCOSEU Negative  10/25/2011 1308   HGBUR NEGATIVE 04/03/2023 2134   BILIRUBINUR NEGATIVE 04/03/2023 2134   BILIRUBINUR neg 10/06/2015 1521   BILIRUBINUR Negative 10/25/2011 1308   KETONESUR NEGATIVE 04/03/2023 2134   PROTEINUR 30 (A) 04/03/2023 2134   UROBILINOGEN 0.2 10/06/2015 1521   NITRITE NEGATIVE 04/03/2023 2134   LEUKOCYTESUR LARGE (A) 04/03/2023 2134   LEUKOCYTESUR Negative 10/25/2011 1308   Sepsis Labs: @LABRCNTIP (procalcitonin:4,lacticidven:4) Microbiology: No results found for this or any previous visit (from the past 240 hour(s)).    Radiology Studies last 3 days: MR PELVIS WO CONTRAST  Result Date: 04/04/2023 CLINICAL DATA:  Pelvic pain. Possible pubic symphysis fracture CT scan. EXAM: MRI PELVIS WITHOUT CONTRAST, MRI LEFT HIP WITHOUT CONTRAST TECHNIQUE: Multiplanar multisequence MR imaging of the pelvis was performed. No intravenous contrast was administered. COMPARISON:  CT SCAN 04/04/2023 FINDINGS: Significant artifact associated with bilateral hip prostheses. No obvious complicating features. No periprosthetic fluid collections. The pubic symphysis and SI joints are intact. Mild degenerative changes. No findings to suggest a sacral stress or insufficiency fracture. No pubic bone fractures. There is significant inflammation/edema and fluid involving the left iliacus muscle suggesting a muscle tear. The iliopsoas tendon appears to be intact. No significant intrapelvic abnormalities are identified. IMPRESSION: 1. Significant inflammation/edema and fluid involving the left iliacus muscle suggesting a muscle tear. The iliopsoas tendon appears to be intact. 2. Bilateral hip prostheses without obvious complicating features. No periprosthetic fluid collections. 3. No findings to suggest a sacral stress or insufficiency fracture. Electronically Signed   By: Rudie Meyer M.D.   On: 04/04/2023 07:41   MR HIP LEFT WO CONTRAST  Result Date: 04/04/2023 CLINICAL DATA:  Pelvic pain. Possible pubic  symphysis fracture CT scan. EXAM: MRI PELVIS WITHOUT CONTRAST, MRI LEFT HIP WITHOUT CONTRAST TECHNIQUE: Multiplanar multisequence MR imaging of the pelvis was performed. No intravenous contrast was administered. COMPARISON:  CT SCAN 04/04/2023 FINDINGS: Significant artifact associated with bilateral hip prostheses. No obvious complicating features. No periprosthetic fluid collections. The pubic symphysis and SI joints are intact. Mild degenerative changes. No findings to suggest  a sacral stress or insufficiency fracture. No pubic bone fractures. There is significant inflammation/edema and fluid involving the left iliacus muscle suggesting a muscle tear. The iliopsoas tendon appears to be intact. No significant intrapelvic abnormalities are identified. IMPRESSION: 1. Significant inflammation/edema and fluid involving the left iliacus muscle suggesting a muscle tear. The iliopsoas tendon appears to be intact. 2. Bilateral hip prostheses without obvious complicating features. No periprosthetic fluid collections. 3. No findings to suggest a sacral stress or insufficiency fracture. Electronically Signed   By: Rudie Meyer M.D.   On: 04/04/2023 07:41   CT ABDOMEN PELVIS W CONTRAST  Result Date: 04/04/2023 CLINICAL DATA:  Left lower quadrant abdominal pain EXAM: CT ABDOMEN AND PELVIS WITH CONTRAST TECHNIQUE: Multidetector CT imaging of the abdomen and pelvis was performed using the standard protocol following bolus administration of intravenous contrast. RADIATION DOSE REDUCTION: This exam was performed according to the departmental dose-optimization program which includes automated exposure control, adjustment of the mA and/or kV according to patient size and/or use of iterative reconstruction technique. CONTRAST:  OMNIPAQUE IOHEXOL 300 MG/ML  SOLN COMPARISON:  CT pelvis 04/03/2023 FINDINGS: Lower chest: Bibasilar atelectasis. Hepatobiliary: Hepatic steatosis. Distended gallbladder. No biliary dilation or  radiopaque stone. Pancreas: Unremarkable. Spleen: Unremarkable. Adrenals/Urinary Tract: Normal adrenal glands. Cortical renal scarring left kidney. No obstructing urinary calculi or hydronephrosis. Unremarkable bladder noting streak artifact from bilateral THA. Stomach/Bowel: Stomach is within normal limits other than a small hiatal hernia. No evidence of obstruction. Mobile cecum located in the central abdomen. Normal appendix. No bowel wall thickening. Vascular/Lymphatic: No significant vascular findings are present. No enlarged abdominal or pelvic lymph nodes. Reproductive: Hysterectomy. Other: No free intraperitoneal fluid or air. Musculoskeletal: Bilateral THA. Streak artifact limits assessment of the pelvis. Remote right pubic rami fractures. No evidence of acute fracture. Extensive spinal fusion hardware. IMPRESSION: 1. No acute findings in the abdomen or pelvis. 2. Hepatic steatosis. Electronically Signed   By: Minerva Fester M.D.   On: 04/04/2023 01:49   CT PELVIS WO CONTRAST  Result Date: 04/03/2023 CLINICAL DATA:  Pelvis pain, stress fracture suspected, no prior imaging EXAM: CT PELVIS WITHOUT CONTRAST TECHNIQUE: Multidetector CT imaging of the pelvis was performed following the standard protocol without intravenous contrast. RADIATION DOSE REDUCTION: This exam was performed according to the departmental dose-optimization program which includes automated exposure control, adjustment of the mA and/or kV according to patient size and/or use of iterative reconstruction technique. COMPARISON:  None Available. FINDINGS: Urinary Tract:  No abnormality visualized. Bowel:  Unremarkable visualized pelvic bowel loops. Vascular/Lymphatic: No pathologically enlarged lymph nodes. No significant vascular abnormality seen. Reproductive:  No mass or other significant abnormality Other: No intraperitoneal free fluid. No intraperitoneal free gas. No organized fluid collection. Musculoskeletal: Limited evaluation due  to streak artifact. Lumbosacral posterior and interbody surgical hardware partially visualized. Total bilateral hip arthroplasty. No CT findings suggest surgical hardware complication. Diffusely decreased bone density. No periprostatic acute fracture along the bilateral hip surgical hardware. No acute displaced fracture or diastasis of the bones of the pelvis. Old healed right inferior and superior pubic rami fractures. Poorly visualized slight periosteal reaction along the pubic symphysis (7:30) that may represent a subacute fracture. Other: None. IMPRESSION: 1. Negative for acute traumatic injury. 2. Poorly visualized slight periosteal reaction along the pubic symphysis that may represent a subacute fracture. Limited evaluation due to streak artifact. Finding would likely be better visualized on radiograph. Electronically Signed   By: Tish Frederickson M.D.   On: 04/03/2023 23:40   CT  Lumbar Spine Wo Contrast  Result Date: 04/03/2023 CLINICAL DATA:  Low back pain, cauda equina syndrome suspected. Left groin and leg pain. EXAM: CT LUMBAR SPINE WITHOUT CONTRAST TECHNIQUE: Multidetector CT imaging of the lumbar spine was performed without intravenous contrast administration. Multiplanar CT image reconstructions were also generated. RADIATION DOSE REDUCTION: This exam was performed according to the departmental dose-optimization program which includes automated exposure control, adjustment of the mA and/or kV according to patient size and/or use of iterative reconstruction technique. COMPARISON:  None Available. FINDINGS: Segmentation: 5 lumbar type vertebrae. Alignment: Severe convex leftward scoliosis.  No subluxation. Vertebrae: Diffuse severe osteopenia. No visible fracture or focal bone lesion. Paraspinal and other soft tissues: Negative Disc levels: Partial fusion across the disc spaces. Diffuse degenerative disc changes. Posterior spinal rods and fusion changes throughout the visualized lower thoracic and  lumbar spine into the sacrum. IMPRESSION: Severe convex leftward scoliosis. Severe osteopenia. No acute bony abnormality. Electronically Signed   By: Charlett Nose M.D.   On: 04/03/2023 23:34             LOS: 1 day      Sunnie Nielsen, DO [facility] 04/05/2023, 3:51 PM    Dictation software may have been used to generate the above note. Typos may occur and escape review in typed/dictated notes. Please contact Dr Lyn Hollingshead directly for clarity if needed.  Staff may message me via secure chat in Epic  but this may not receive an immediate response,  please page me for urgent matters!  If 7PM-7AM, please contact night coverage [email or web address]

## 2023-04-05 NOTE — Progress Notes (Signed)
PT Cancellation Note  Patient Details Name: Kristi Lee MRN: [id number] DOB: 08/30/1955   Cancelled Treatment:     PT attempt. Pt currently working with OT. Author will return later this date and continue to follow + progress per current POC.    Rushie Chestnut 04/05/2023, 9:52 AM

## 2023-04-06 DIAGNOSIS — R1032 Left lower quadrant pain: Secondary | ICD-10-CM | POA: Diagnosis not present

## 2023-04-06 MED ORDER — SODIUM CHLORIDE 0.9 % IV SOLN: INTRAVENOUS | Status: AC

## 2023-04-06 MED ORDER — SODIUM CHLORIDE 0.9 % IV SOLN: INTRAVENOUS | Status: DC

## 2023-04-06 MED ORDER — DIPHENHYDRAMINE HCL 25 MG PO CAPS: 25.0000 mg | ORAL_CAPSULE | Freq: Four times a day (QID) | ORAL | Status: DC | PRN

## 2023-04-06 NOTE — Progress Notes (Signed)
Occupational Therapy Treatment Patient Details Name: Kristi Lee MRN: [id number] DOB: 14-Sep-1955 Today's Date: 04/06/2023   History of present illness Pt is a 67 y.o. female presenting to hospital 04/03/23 with c/o sharp pain to L hip and L groin region (woke up with; no reported trauma).  Imaging showing severe convex leftward scoliosis; significant inflammation/edema and fluid involving the left iliacus muscle suggesting a muscle tear. The iliopsoas tendon appears to be intact.  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  Pt admitted with LLQ abdominal pain secondary to iliacus muscle tear, hypokalemia, and AKI.  PMH includes SOB, htn, depression, OA, anemia, L reverse shoulder arthroplasty.   OT comments  Pt seen for OT tx. Coordinated with RN prior to session for optimal pain mgt. Pt endorsing improved pain but still painful with movement. Pt and spouse instructed in AE for LB ADL tasks including bathing and dressing to minimize hip flexion which increases her pain. Pt educated in home/routines modifications around ADL tasks to minimize pain and improve safety. Pt/spouse verbalized understanding. Pt engaged in STS transfers with RW and required initial MIN A from elevated recliner surface (pillow in seat) and VC for hand and foot placement with fair carryover with PRN VC. Pt visibly fatigued with the effort. Educated in use of PLB to support breath recovery. Pt progressing towards goals, continues to benefit from skilled OT Services.       If plan is discharge home, recommend the following:  A lot of help with bathing/dressing/bathroom;Assistance with cooking/housework;Help with stairs or ramp for entrance;Assist for transportation;A little help with walking and/or transfers   Equipment Recommendations  BSC/3in1    Recommendations for Other Services      Precautions / Restrictions  Precautions Precautions: Fall Restrictions Weight Bearing Restrictions: No Other Position/Activity Restrictions: L iliacus muscle tear       Mobility Bed Mobility               General bed mobility comments: NT in recliner    Transfers Overall transfer level: Needs assistance Equipment used: Rolling walker (2 wheels) Transfers: Sit to/from Stand Sit to Stand: Min assist, Contact guard assist           General transfer comment: VC for anterior shift prior to standing, hand placement requiring MIN A improving to CGA for subsequent STS     Balance Overall balance assessment: Needs assistance Sitting-balance support: No upper extremity supported, Feet supported Sitting balance-Leahy Scale: Good     Standing balance support: Reliant on assistive device for balance, During functional activity, Bilateral upper extremity supported Standing balance-Leahy Scale: Fair                             ADL either performed or assessed with clinical judgement   ADL Overall ADL's : Needs assistance/impaired               Lower Body Bathing Details (indicate cue type and reason): Pt/spouse educated in AE for LB bathing     Lower Body Dressing: Moderate assistance;Sit to/from stand Lower Body Dressing Details (indicate cue type and reason): Pt/spouse educated in AE for LB dressing                    Extremity/Trunk Assessment              Vision  Perception     Praxis      Cognition Arousal: Alert Behavior During Therapy: WFL for tasks assessed/performed Overall Cognitive Status: Within Functional Limits for tasks assessed                                          Exercises Other Exercises Other Exercises: Pt instructed in strategies to improve posture and extension through hips and knees in standing, as pt tends to remain in flexed position impairing her balance and functional mobility.    Shoulder Instructions        General Comments      Pertinent Vitals/ Pain       Pain Assessment Pain Assessment: Faces Faces Pain Scale: Hurts even more Pain Location: abdominal/L groin pain Pain Descriptors / Indicators: Sharp, Grimacing, Guarding Pain Intervention(s): Limited activity within patient's tolerance, Monitored during session, Premedicated before session, Repositioned, Ice applied  Home Living                                          Prior Functioning/Environment              Frequency  Min 1X/week        Progress Toward Goals  OT Goals(current goals can now be found in the care plan section)  Progress towards OT goals: Progressing toward goals  Acute Rehab OT Goals Patient Stated Goal: to decrease pain OT Goal Formulation: With patient Time For Goal Achievement: 04/18/23 Potential to Achieve Goals: Good  Plan      Co-evaluation                 AM-PAC OT "6 Clicks" Daily Activity     Outcome Measure   Help from another person eating meals?: None Help from another person taking care of personal grooming?: None Help from another person toileting, which includes using toliet, bedpan, or urinal?: A Lot Help from another person bathing (including washing, rinsing, drying)?: A Lot Help from another person to put on and taking off regular upper body clothing?: None Help from another person to put on and taking off regular lower body clothing?: A Lot 6 Click Score: 18    End of Session Equipment Utilized During Treatment: Rolling walker (2 wheels)  OT Visit Diagnosis: Muscle weakness (generalized) (M62.81);Pain Pain - Right/Left: Left Pain - part of body: Leg   Activity Tolerance Patient tolerated treatment well   Patient Left in chair;with call bell/phone within reach;with family/visitor present   Nurse Communication          Time: [id number] OT Time Calculation (min): 23 min  Charges: OT General Charges $OT Visit: 1 Visit OT  Treatments $Self Care/Home Management : 8-22 mins $Therapeutic Activity: 8-22 mins  Arman Filter., MPH, MS, OTR/L ascom [phone] 04/06/23, 4:11 PM

## 2023-04-06 NOTE — [place] FL2 (Signed)
[place] MEDICAID FL2 LEVEL OF CARE FORM     IDENTIFICATION  Patient Name: Kristi Lee Birthdate: [birth year] Sex: female Admission Date (Current Location): 04/03/2023  [facility] and [place] Number:  Chiropodist and Address:  [facility], [address], [place], [place] [address]      Provider Number: [id number]  Attending Physician Name and Address:  Sunnie Nielsen, DO  Relative Name and Phone Number:       Current Level of Care: Hospital Recommended Level of Care: Skilled Nursing Facility Prior Approval Number:    Date Approved/Denied:   PASRR Number: [id number]  Discharge Plan: SNF    Current Diagnoses: Patient Active Problem List   Diagnosis Date Noted   Left lower quadrant abdominal pain 04/04/2023   Left flank pain 04/04/2023   Vertigo    Dizziness    Nausea    AKI (acute kidney injury) (HCC)    Hypomagnesemia    Hypokalemia    Iron deficiency anemia due to chronic blood loss    Near syncope 05/25/2021   Dyspnea on exertion 08/19/2020   Special screening for malignant neoplasms, colon    Polyp of transverse colon    DVT (deep venous thrombosis) (HCC) 11/08/2019   Burning sensation 10/17/2019   Itchy scalp 10/17/2019   Occipital neuralgia of right side 10/17/2019   Tingling 10/17/2019   Hyperlipidemia, mixed 10/10/2016   Stable angina pectoris 10/10/2016   Adaptation reaction 01/27/2015   Back pain, chronic 01/27/2015   CN (constipation) 01/27/2015   Abnormal LFTs 01/27/2015   Essential hypertension 01/27/2015   Gastro-esophageal reflux disease without esophagitis 01/27/2015   Personal history of arthritis 01/27/2015   Insomnia 01/27/2015   Scoliosis 01/27/2015   FOM (frequency of micturition) 01/27/2015    Orientation RESPIRATION BLADDER Height & Weight     Self, Time, Situation, Place  Normal Continent, External catheter Weight: 173 lb 1 oz (78.5 kg) Height:  5\' 6"  (167.6 cm)  BEHAVIORAL  SYMPTOMS/MOOD NEUROLOGICAL BOWEL NUTRITION STATUS   (None)  (None) Continent Diet (Regular)  AMBULATORY STATUS COMMUNICATION OF NEEDS Skin   Extensive Assist Verbally Normal                       Personal Care Assistance Level of Assistance  Bathing, Feeding, Dressing Bathing Assistance: Maximum assistance Feeding assistance: Limited assistance Dressing Assistance: Maximum assistance     Functional Limitations Info  Sight, Hearing, Speech Sight Info: Adequate Hearing Info: Adequate Speech Info: Adequate    SPECIAL CARE FACTORS FREQUENCY  PT (By licensed PT), OT (By licensed OT)     PT Frequency: 5 x week OT Frequency: 5 x week            Contractures Contractures Info: Not present    Additional Factors Info  Code Status, Allergies Code Status Info: Full code Allergies Info: NKDA           Current Medications (04/06/2023):  This is the current hospital active medication list Current Facility-Administered Medications  Medication Dose Route Frequency Provider Last Rate Last Admin   0.9 %  sodium chloride infusion   Intravenous Continuous Sunnie Nielsen, DO       acetaminophen (TYLENOL) tablet 650 mg  650 mg Oral Q6H PRN Gertha Calkin, MD   650 mg at 04/05/23 2044   Or   acetaminophen (TYLENOL) suppository 650 mg  650 mg Rectal Q6H PRN Gertha Calkin, MD       cefTRIAXone (ROCEPHIN)  1 g in sodium chloride 0.9 % 100 mL IVPB  1 g Intravenous Q24H Irena Cords V, MD 200 mL/hr at 04/05/23 2230 1 g at 04/05/23 2230   docusate sodium (COLACE) capsule 100 mg  100 mg Oral Daily Rosezetta Schlatter T, MD   100 mg at 04/06/23 0905   fesoterodine (TOVIAZ) tablet 4 mg  4 mg Oral Daily Angelique Blonder, RPH   4 mg at 04/06/23 9629   metoprolol succinate (TOPROL-XL) 24 hr tablet 12.5 mg  12.5 mg Oral Daily Sunnie Nielsen, DO   12.5 mg at 04/06/23 5284   morphine (PF) 2 MG/ML injection 2 mg  2 mg Intravenous Q4H PRN Sunnie Nielsen, DO   2 mg at 04/05/23 1453   ondansetron  (ZOFRAN-ODT) disintegrating tablet 4 mg  4 mg Oral Q8H PRN Gertha Calkin, MD       oxyCODONE (Oxy IR/ROXICODONE) immediate release tablet 10 mg  10 mg Oral Q6H PRN Sunnie Nielsen, DO   10 mg at 04/05/23 2232   pantoprazole (PROTONIX) EC tablet 40 mg  40 mg Oral Daily Rosezetta Schlatter T, MD   40 mg at 04/06/23 0905   polyethylene glycol (MIRALAX / GLYCOLAX) packet 17 g  17 g Oral Daily Rosezetta Schlatter T, MD   17 g at 04/06/23 0905   pregabalin (LYRICA) capsule 75 mg  75 mg Oral TID Sunnie Nielsen, DO   75 mg at 04/06/23 1324   rivaroxaban (XARELTO) tablet 20 mg  20 mg Oral Q supper Irena Cords V, MD   20 mg at 04/05/23 1623   sodium chloride flush (NS) 0.9 % injection 3 mL  3 mL Intravenous Q12H Irena Cords V, MD   3 mL at 04/06/23 0905   traZODone (DESYREL) tablet 200 mg  200 mg Oral QHS PRN Gertha Calkin, MD   200 mg at 04/05/23 2232   venlafaxine XR (EFFEXOR-XR) 24 hr capsule 75 mg  75 mg Oral Q breakfast Gertha Calkin, MD   75 mg at 04/06/23 4010     Discharge Medications: Please see discharge summary for a list of discharge medications.  Relevant Imaging Results:  Relevant Lab Results:   Additional Information SS#: [id number]  Margarito Liner, LCSW

## 2023-04-06 NOTE — Progress Notes (Signed)
Physical Therapy Treatment Patient Details Name: Kristi Lee MRN: [id number] DOB: [birth year] Today's Date: 04/06/2023   History of Present Illness Pt is a 67 y.o. female presenting to hospital 04/03/23 with c/o sharp pain to L hip and L groin region (woke up with; no reported trauma).  Imaging showing severe convex leftward scoliosis; significant inflammation/edema and fluid involving the left iliacus muscle suggesting a muscle tear. The iliopsoas tendon appears to be intact.  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  Pt admitted with LLQ abdominal pain secondary to iliacus muscle tear, hypokalemia, and AKI.  PMH includes SOB, htn, depression, OA, anemia, L reverse shoulder arthroplasty.    PT Comments  Pt received in bed earlier today. Pre-medicted prior to session, husband at bedside. Pt educated on safe mobility techniques to reduce increased pain. Improved ability to transfer to edge of bed with greater ease. Sit<>stand at [facility] with CG/MinA, limited wt acceptance through L LE due to pain. Pt able to take short steps to bedside chair with MinA for balance and weight shifting. Overall good tolerance, slowly progressing and remains motivated.    If plan is discharge home, recommend the following: A lot of help with walking and/or transfers;A lot of help with bathing/dressing/bathroom;Assistance with cooking/housework;Direct supervision/assist for medications management;Direct supervision/assist for financial management;Assist for transportation;Help with stairs or ramp for entrance   Can travel by private vehicle        Equipment Recommendations  Other (comment) (Defer to next level of care)    Recommendations for Other Services       Precautions / Restrictions Precautions Precautions: Fall Restrictions Weight Bearing Restrictions: No Other Position/Activity Restrictions: L iliacus  muscle tear, WBAT     Mobility  Bed Mobility Overal bed mobility: Needs Assistance Bed Mobility: Sit to Supine     Supine to sit: Mod assist, Min assist          Transfers Overall transfer level: Needs assistance Equipment used: Rolling walker (2 wheels) Transfers: Sit to/from Stand Sit to Stand: Min assist, Contact guard assist, From elevated surface           General transfer comment:  (VC's for upright posture and safety)    Ambulation/Gait Ambulation/Gait assistance: Contact guard assist, Min assist Gait Distance (Feet):  (4) Assistive device: Rolling walker (2 wheels) Gait Pattern/deviations: Step-to pattern, Antalgic Gait velocity: decreased     General Gait Details: Short disctance gait due to pain with limited wt bearing through L LE.   Stairs             Wheelchair Mobility     Tilt Bed    Modified Rankin (Stroke Patients Only)       Balance Overall balance assessment: Needs assistance Sitting-balance support: No upper extremity supported, Feet supported Sitting balance-Leahy Scale: Good     Standing balance support: Reliant on assistive device for balance, During functional activity, Bilateral upper extremity supported Standing balance-Leahy Scale: Fair Standing balance comment: Pt requires extensive/heavy BUE support on RW for all standing activity. poor wt acceptance on LLE due to pain however was able to advance away from EOB with BUE support                            Cognition Arousal: Alert Behavior During Therapy: [facility] for tasks assessed/performed Overall Cognitive Status: Within Functional Limits for tasks assessed  General Comments: Pt is A and O x 4. pleasant and cooperative        Exercises Other Exercises Other Exercises: Pt instructed in B LE seated strengthening exercises to tolerance.    General Comments        Pertinent Vitals/Pain Pain  Assessment Pain Assessment: 0-10 Pain Score: 6  Pain Location: abdominal/L groin pain Pain Descriptors / Indicators: Sharp, Grimacing, Guarding Pain Intervention(s): Premedicated before session, Ice applied    Home Living                          Prior Function            PT Goals (current goals can now be found in the care plan section) Acute Rehab PT Goals Patient Stated Goal: get better so I can go home." Progress towards PT goals: Progressing toward goals    Frequency    Min 1X/week      PT Plan      Co-evaluation              AM-PAC PT "6 Clicks" Mobility   Outcome Measure  Help needed turning from your back to your side while in a flat bed without using bedrails?: A Lot Help needed moving from lying on your back to sitting on the side of a flat bed without using bedrails?: A Lot Help needed moving to and from a bed to a chair (including a wheelchair)?: A Lot Help needed standing up from a chair using your arms (e.g., wheelchair or bedside chair)?: A Lot Help needed to walk in hospital room?: A Lot Help needed climbing 3-5 steps with a railing? : A Lot 6 Click Score: 12    End of Session Equipment Utilized During Treatment: Gait belt Activity Tolerance: Patient tolerated treatment well Patient left: in chair;with call bell/phone within reach;with chair alarm set;with family/visitor present Nurse Communication: Mobility status PT Visit Diagnosis: Other abnormalities of gait and mobility (R26.89);Muscle weakness (generalized) (M62.81);History of falling (Z91.81);Pain Pain - Right/Left: Left Pain - part of body:  (groin)     Time: 4132-4401 PT Time Calculation (min) (ACUTE ONLY): 33 min  Charges:    $Therapeutic Exercise: 8-22 mins $Therapeutic Activity: 8-22 mins PT General Charges $$ ACUTE PT VISIT: 1 Visit                    Zadie Cleverly, PTA  Jannet Askew 04/06/2023, 4:16 PM

## 2023-04-06 NOTE — Progress Notes (Signed)
PROGRESS NOTE    Kristi Lee   NFA:[id number] DOB: 11/10/1955  DOA: 04/03/2023 Date of Service: 04/06/23 PCP: [facility]     Brief Narrative / Hospital Course:  Kristi Lee is a 67 y.o. female with medical history significant for scoliosis status postsurgery, congestive heart failure diastolic, DVT, GERD, hypertension presenting to ED 08/19 with left-sided groin pain. Patient was getting out of bed this morning and felt that sharp pain in the left pelvis left flank left hip area patient states the pain was 10 out of 10 it was so bad that she started to ice it she was not able to move out of her bed 08/19: in ED, CT imaging limited, question pubic rami fx, admitted to hospitalist service and MRI ordered. Start IV abx for UTI also.  08/20: on MRI hip/pelvis, "Significant inflammation/edema and fluid involving the left iliacus muscle suggesting a muscle tear. The iliopsoas tendon appears to be intact." Per ortho, no surgical intervention needed 08/21: workign w/ PT/OT, recs for SNF, pain control has been challenging, trial IV morphine for breakthrough. TOC consult for SNF placement rehab.  08/22: overnight, febrile/tachycardic - no new BCx drawn, fever and HR responded to tylenol/pain control. Pt feeling well later today.   Consultants:  Curbsied to Orthopedics as below, no formal consult at this time   Procedures: None       ASSESSMENT & PLAN:   Principal Problem:   Left lower quadrant abdominal pain Active Problems:   Abnormal LFTs   Essential hypertension   Gastro-esophageal reflux disease without esophagitis   DVT (deep venous thrombosis) (HCC)   AKI (acute kidney injury) (HCC)   Hypomagnesemia   Hypokalemia   Iron deficiency anemia due to chronic blood loss   Left flank pain  Left lower quadrant abdominal pain and groin pain secondary to iliacus muscle tear Dr Meriam Sprague (hospitalist) discussed the MRI findings with Dr. Leron Croak  orthopedic surgeon --> patient does not require surgery and will need physical therapy and pain management. PT OT consulted --> SNF rehab  Monitor H&H closely as patient is on Xarelto, in the setting of MRI findings --> no anemia.   Iron deficiency anemia NO active blood loss at this time.  Pt sees Dr.kowalski and [facility].    Hypokalemia Replace as needed Monitor BMP   Hypovolemic hyponatremia - resolved Monitor BMP   Hypomagnesemia Will check levels and manage.   UTI Briefly meeting sepsis criteria w/ fever and tachycardia overnight Rocephin Await cultures --> never sent on admission, will try to add these on  Restarted IV fluids today for 1L   AKI (acute kidney injury) (HCC) - improving then Cr slightly up again today Patient with baseline creatinine within normal limits of 0.8 however presented with creatinine of 1.2 S/p IV fluids, repeat today 08/22 given Cr bumped up again slightly  Monitor renal function closely Avoid nephrotoxic agents   DVT (deep venous thrombosis) (HCC) Factor V Leiden Continue Xarelto   Gastro-esophageal reflux disease without esophagitis PPI.  Aspiration precaution.   Essential hypertension hold hydrochlorothiazide  Conitnue metoprolol Monitor blood pressure closely     DVT prophylaxis: xarelto Pertinent IV fluids/nutrition: no continuous IV fluids  Central lines / invasive devices: none  Code Status: FULL CODE ACP documentation reviewed: none on file   Current Admission Status: inpatient   TOC needs / Dispo plan: SNF rehab Barriers to discharge / significant pending items: pain control IV meds still required today, placment per TOC  Subjective / Brief ROS:  Patient reports pain is a bit better today but still very difficult to move Reports mild itching, diffuse, no rash  Denies CP/SOB.  Denies new weakness.  Tolerating diet.    Family Communication husband at bedside on rounds     Objective  Findings:  Vitals:   04/06/23 0239 04/06/23 0405 04/06/23 0629 04/06/23 1422  BP: 126/80  106/64 117/69  Pulse: (!) 101  (!) 109 (!) 109  Resp: 18  18 18   Temp: 98.1 F (36.7 C)  98.2 F (36.8 C) 98.3 F (36.8 C)  TempSrc: Oral  Oral Oral  SpO2: 98%  100% 94%  Weight:  78.5 kg    Height:        Intake/Output Summary (Last 24 hours) at 04/06/2023 1500 Last data filed at 04/06/2023 1014 Gross per 24 hour  Intake 120 ml  Output 550 ml  Net -430 ml   Filed Weights   04/03/23 1934 04/05/23 0457 04/06/23 0405  Weight: 74.8 kg 76.7 kg 78.5 kg    Examination:  Physical Exam Constitutional:      General: She is not in acute distress.    Appearance: Normal appearance.  Cardiovascular:     Rate and Rhythm: Normal rate and regular rhythm.  Pulmonary:     Effort: Pulmonary effort is normal.     Breath sounds: Normal breath sounds.  Abdominal:     Palpations: Abdomen is soft.  Musculoskeletal:     Right lower leg: No edema.     Left lower leg: No edema.  Skin:    General: Skin is warm and dry.     Findings: No rash.  Neurological:     General: No focal deficit present.     Mental Status: She is alert and oriented to person, place, and time.  Psychiatric:        Mood and Affect: Mood normal.        Behavior: Behavior normal.          Scheduled Medications:   docusate sodium  100 mg Oral Daily   fesoterodine  4 mg Oral Daily   metoprolol succinate  12.5 mg Oral Daily   pantoprazole  40 mg Oral Daily   polyethylene glycol  17 g Oral Daily   pregabalin  75 mg Oral TID   rivaroxaban  20 mg Oral Q supper   sodium chloride flush  3 mL Intravenous Q12H   venlafaxine XR  75 mg Oral Q breakfast    Continuous Infusions:  sodium chloride 100 mL/hr at 04/06/23 1114   cefTRIAXone (ROCEPHIN)  IV 1 g (04/05/23 2230)    PRN Medications:  acetaminophen **OR** acetaminophen, morphine injection, ondansetron, oxyCODONE, traZODone  Antimicrobials from admission:   Anti-infectives (From admission, onward)    Start     Dose/Rate Route Frequency Ordered Stop   04/04/23 2200  cefTRIAXone (ROCEPHIN) 1 g in sodium chloride 0.9 % 100 mL IVPB        1 g 200 mL/hr over 30 Minutes Intravenous Every 24 hours 04/04/23 0133     04/04/23 0015  cefTRIAXone (ROCEPHIN) 1 g in sodium chloride 0.9 % 100 mL IVPB        1 g 200 mL/hr over 30 Minutes Intravenous  Once 04/04/23 0010 04/04/23 0118           Data Reviewed:  I have personally reviewed the following...  CBC: Recent Labs  Lab 04/03/23 1937 04/04/23 0717 04/05/23 0409  WBC 17.1* 12.8*  12.1*  NEUTROABS 14.2*  --  10.8*  HGB 13.5 12.2 12.3  HCT 41.9 37.0 36.5  MCV 87.5 87.1 85.7  PLT 178 131* 129*   Basic Metabolic Panel: Recent Labs  Lab 04/03/23 1937 04/04/23 0717 04/05/23 0409  NA 132* 133* 135  K 3.1* 3.0* 4.9  CL 96* 101 106  CO2 24 24 25   GLUCOSE 141* 141* 169*  BUN 23 22 25*  CREATININE 1.23* 1.04* 1.19*  CALCIUM 8.9 8.1* 8.1*   GFR: Estimated Creatinine Clearance: 48.5 mL/min (A) (by C-G formula based on SCr of 1.19 mg/dL (H)). Liver Function Tests: Recent Labs  Lab 04/03/23 1937 04/04/23 0717  AST 29 25  ALT 26 24  ALKPHOS 143* 121  BILITOT 1.0 0.9  PROT 7.3 6.6  ALBUMIN 4.0 3.3*   No results for input(s): "LIPASE", "AMYLASE" in the last 168 hours. No results for input(s): "AMMONIA" in the last 168 hours. Coagulation Profile: No results for input(s): "INR", "PROTIME" in the last 168 hours. Cardiac Enzymes: No results for input(s): "CKTOTAL", "CKMB", "CKMBINDEX", "TROPONINI" in the last 168 hours. BNP (last 3 results) No results for input(s): "PROBNP" in the last 8760 hours. HbA1C: Recent Labs    04/03/23 1937  HGBA1C 6.3*   CBG: No results for input(s): "GLUCAP" in the last 168 hours. Lipid Profile: No results for input(s): "CHOL", "HDL", "LDLCALC", "TRIG", "CHOLHDL", "LDLDIRECT" in the last 72 hours. Thyroid Function Tests: No results for input(s):  "TSH", "T4TOTAL", "FREET4", "T3FREE", "THYROIDAB" in the last 72 hours. Anemia Panel: No results for input(s): "VITAMINB12", "FOLATE", "FERRITIN", "TIBC", "IRON", "RETICCTPCT" in the last 72 hours. Most Recent Urinalysis On File:     Component Value Date/Time   COLORURINE YELLOW (A) 04/03/2023 2134   APPEARANCEUR CLOUDY (A) 04/03/2023 2134   APPEARANCEUR Cloudy 10/25/2011 1308   LABSPEC 1.016 04/03/2023 2134   LABSPEC 1.015 10/25/2011 1308   PHURINE 5.0 04/03/2023 2134   GLUCOSEU NEGATIVE 04/03/2023 2134   GLUCOSEU Negative 10/25/2011 1308   HGBUR NEGATIVE 04/03/2023 2134   BILIRUBINUR NEGATIVE 04/03/2023 2134   BILIRUBINUR neg 10/06/2015 1521   BILIRUBINUR Negative 10/25/2011 1308   KETONESUR NEGATIVE 04/03/2023 2134   PROTEINUR 30 (A) 04/03/2023 2134   UROBILINOGEN 0.2 10/06/2015 1521   NITRITE NEGATIVE 04/03/2023 2134   LEUKOCYTESUR LARGE (A) 04/03/2023 2134   LEUKOCYTESUR Negative 10/25/2011 1308   Sepsis Labs: @LABRCNTIP (procalcitonin:4,lacticidven:4) Microbiology: No results found for this or any previous visit (from the past 240 hour(s)).    Radiology Studies last 3 days: MR PELVIS WO CONTRAST  Result Date: 04/04/2023 CLINICAL DATA:  Pelvic pain. Possible pubic symphysis fracture CT scan. EXAM: MRI PELVIS WITHOUT CONTRAST, MRI LEFT HIP WITHOUT CONTRAST TECHNIQUE: Multiplanar multisequence MR imaging of the pelvis was performed. No intravenous contrast was administered. COMPARISON:  CT SCAN 04/04/2023 FINDINGS: Significant artifact associated with bilateral hip prostheses. No obvious complicating features. No periprosthetic fluid collections. The pubic symphysis and SI joints are intact. Mild degenerative changes. No findings to suggest a sacral stress or insufficiency fracture. No pubic bone fractures. There is significant inflammation/edema and fluid involving the left iliacus muscle suggesting a muscle tear. The iliopsoas tendon appears to be intact. No significant  intrapelvic abnormalities are identified. IMPRESSION: 1. Significant inflammation/edema and fluid involving the left iliacus muscle suggesting a muscle tear. The iliopsoas tendon appears to be intact. 2. Bilateral hip prostheses without obvious complicating features. No periprosthetic fluid collections. 3. No findings to suggest a sacral stress or insufficiency fracture. Electronically Signed   By: Demetrius Charity.  Gallerani M.D.   On: 04/04/2023 07:41   MR HIP LEFT WO CONTRAST  Result Date: 04/04/2023 CLINICAL DATA:  Pelvic pain. Possible pubic symphysis fracture CT scan. EXAM: MRI PELVIS WITHOUT CONTRAST, MRI LEFT HIP WITHOUT CONTRAST TECHNIQUE: Multiplanar multisequence MR imaging of the pelvis was performed. No intravenous contrast was administered. COMPARISON:  CT SCAN 04/04/2023 FINDINGS: Significant artifact associated with bilateral hip prostheses. No obvious complicating features. No periprosthetic fluid collections. The pubic symphysis and SI joints are intact. Mild degenerative changes. No findings to suggest a sacral stress or insufficiency fracture. No pubic bone fractures. There is significant inflammation/edema and fluid involving the left iliacus muscle suggesting a muscle tear. The iliopsoas tendon appears to be intact. No significant intrapelvic abnormalities are identified. IMPRESSION: 1. Significant inflammation/edema and fluid involving the left iliacus muscle suggesting a muscle tear. The iliopsoas tendon appears to be intact. 2. Bilateral hip prostheses without obvious complicating features. No periprosthetic fluid collections. 3. No findings to suggest a sacral stress or insufficiency fracture. Electronically Signed   By: Rudie Meyer M.D.   On: 04/04/2023 07:41   CT ABDOMEN PELVIS W CONTRAST  Result Date: 04/04/2023 CLINICAL DATA:  Left lower quadrant abdominal pain EXAM: CT ABDOMEN AND PELVIS WITH CONTRAST TECHNIQUE: Multidetector CT imaging of the abdomen and pelvis was performed using the  standard protocol following bolus administration of intravenous contrast. RADIATION DOSE REDUCTION: This exam was performed according to the departmental dose-optimization program which includes automated exposure control, adjustment of the mA and/or kV according to patient size and/or use of iterative reconstruction technique. CONTRAST:  OMNIPAQUE IOHEXOL 300 MG/ML  SOLN COMPARISON:  CT pelvis 04/03/2023 FINDINGS: Lower chest: Bibasilar atelectasis. Hepatobiliary: Hepatic steatosis. Distended gallbladder. No biliary dilation or radiopaque stone. Pancreas: Unremarkable. Spleen: Unremarkable. Adrenals/Urinary Tract: Normal adrenal glands. Cortical renal scarring left kidney. No obstructing urinary calculi or hydronephrosis. Unremarkable bladder noting streak artifact from bilateral THA. Stomach/Bowel: Stomach is within normal limits other than a small hiatal hernia. No evidence of obstruction. Mobile cecum located in the central abdomen. Normal appendix. No bowel wall thickening. Vascular/Lymphatic: No significant vascular findings are present. No enlarged abdominal or pelvic lymph nodes. Reproductive: Hysterectomy. Other: No free intraperitoneal fluid or air. Musculoskeletal: Bilateral THA. Streak artifact limits assessment of the pelvis. Remote right pubic rami fractures. No evidence of acute fracture. Extensive spinal fusion hardware. IMPRESSION: 1. No acute findings in the abdomen or pelvis. 2. Hepatic steatosis. Electronically Signed   By: Minerva Fester M.D.   On: 04/04/2023 01:49   CT PELVIS WO CONTRAST  Result Date: 04/03/2023 CLINICAL DATA:  Pelvis pain, stress fracture suspected, no prior imaging EXAM: CT PELVIS WITHOUT CONTRAST TECHNIQUE: Multidetector CT imaging of the pelvis was performed following the standard protocol without intravenous contrast. RADIATION DOSE REDUCTION: This exam was performed according to the departmental dose-optimization program which includes automated exposure  control, adjustment of the mA and/or kV according to patient size and/or use of iterative reconstruction technique. COMPARISON:  None Available. FINDINGS: Urinary Tract:  No abnormality visualized. Bowel:  Unremarkable visualized pelvic bowel loops. Vascular/Lymphatic: No pathologically enlarged lymph nodes. No significant vascular abnormality seen. Reproductive:  No mass or other significant abnormality Other: No intraperitoneal free fluid. No intraperitoneal free gas. No organized fluid collection. Musculoskeletal: Limited evaluation due to streak artifact. Lumbosacral posterior and interbody surgical hardware partially visualized. Total bilateral hip arthroplasty. No CT findings suggest surgical hardware complication. Diffusely decreased bone density. No periprostatic acute fracture along the bilateral hip surgical hardware. No acute displaced  fracture or diastasis of the bones of the pelvis. Old healed right inferior and superior pubic rami fractures. Poorly visualized slight periosteal reaction along the pubic symphysis (7:30) that may represent a subacute fracture. Other: None. IMPRESSION: 1. Negative for acute traumatic injury. 2. Poorly visualized slight periosteal reaction along the pubic symphysis that may represent a subacute fracture. Limited evaluation due to streak artifact. Finding would likely be better visualized on radiograph. Electronically Signed   By: Tish Frederickson M.D.   On: 04/03/2023 23:40   CT Lumbar Spine Wo Contrast  Result Date: 04/03/2023 CLINICAL DATA:  Low back pain, cauda equina syndrome suspected. Left groin and leg pain. EXAM: CT LUMBAR SPINE WITHOUT CONTRAST TECHNIQUE: Multidetector CT imaging of the lumbar spine was performed without intravenous contrast administration. Multiplanar CT image reconstructions were also generated. RADIATION DOSE REDUCTION: This exam was performed according to the departmental dose-optimization program which includes automated exposure control,  adjustment of the mA and/or kV according to patient size and/or use of iterative reconstruction technique. COMPARISON:  None Available. FINDINGS: Segmentation: 5 lumbar type vertebrae. Alignment: Severe convex leftward scoliosis.  No subluxation. Vertebrae: Diffuse severe osteopenia. No visible fracture or focal bone lesion. Paraspinal and other soft tissues: Negative Disc levels: Partial fusion across the disc spaces. Diffuse degenerative disc changes. Posterior spinal rods and fusion changes throughout the visualized lower thoracic and lumbar spine into the sacrum. IMPRESSION: Severe convex leftward scoliosis. Severe osteopenia. No acute bony abnormality. Electronically Signed   By: Charlett Nose M.D.   On: 04/03/2023 23:34             LOS: 2 days      Sunnie Nielsen, DO [facility] 04/06/2023, 3:00 PM    Dictation software may have been used to generate the above note. Typos may occur and escape review in typed/dictated notes. Please contact Dr Lyn Hollingshead directly for clarity if needed.  Staff may message me via secure chat in Epic  but this may not receive an immediate response,  please page me for urgent matters!  If 7PM-7AM, please contact night coverage [email or web address]

## 2023-04-06 NOTE — TOC Initial Note (Addendum)
Transition of Care [facility]) - Initial/Assessment Note    Patient Details  Name: Kristi Lee MRN: [id number] Date of Birth: 1956-01-17  Transition of Care [facility] CM/SW Contact:    Margarito Liner, LCSW Phone Number: 04/06/2023, 10:38 AM  Clinical Narrative: CSW met with patient. Husband at bedside. CSW introduced role and explained that therapy recommendations would be discussed. Patient and her husband are agreeable to SNF placement. Provided CMS scores for facilities within 25 miles of their zip code. Preferences are [facility], [facility], and [facility]. No further concerns. CSW encouraged patient and her husband to contact CSW as needed. CSW will continue to follow patient and her husband for support and facilitate discharge to SNF once medically stable.                 11:19 am: [facility] offered a bed. [facility] is reviewing referral.  11:49 am: CSW reviewed offers with patient and husband. She has accepted offer from [facility]. They can take her tomorrow or Saturday.  Expected Discharge Plan: Skilled Nursing Facility Barriers to Discharge: Continued Medical Work up   Patient Goals and CMS Choice   CMS Medicare.gov Compare Post Acute Care list provided to:: Patient        Expected Discharge Plan and Services     Post Acute Care Choice: Skilled Nursing Facility Living arrangements for the past 2 months: Single Family Home                                      Prior Living Arrangements/Services Living arrangements for the past 2 months: Single Family Home Lives with:: Spouse Patient language and need for interpreter reviewed:: Yes Do you feel safe going back to the place where you live?: Yes      Need for Family Participation in Patient Care: Yes (Comment) Care giver support system in place?: Yes (comment)   Criminal Activity/Legal Involvement Pertinent to Current Situation/Hospitalization: No - Comment  as needed  Activities of Daily Living Home Assistive Devices/Equipment: None ADL Screening (condition at time of admission) Patient's cognitive ability adequate to safely complete daily activities?: Yes Is the patient deaf or have difficulty hearing?: No Does the patient have difficulty seeing, even when wearing glasses/contacts?: No Does the patient have difficulty concentrating, remembering, or making decisions?: No Patient able to express need for assistance with ADLs?: Yes Does the patient have difficulty dressing or bathing?: No Independently performs ADLs?: Yes (appropriate for developmental age) Does the patient have difficulty walking or climbing stairs?: No Weakness of Legs: Left Weakness of Arms/Hands: None  Permission Sought/Granted Permission sought to share information with : Facility Medical sales representative, Family Supports Permission granted to share information with : Yes, Verbal Permission Granted  Share Information with NAME: Kristi Lee  Permission granted to share info w AGENCY: SNF's  Permission granted to share info w Relationship: Husband  Permission granted to share info w Contact Information: [phone]  Emotional Assessment Appearance:: Appears stated age Attitude/Demeanor/Rapport: Engaged, Gracious Affect (typically observed): Accepting, Appropriate, Calm, Pleasant Orientation: : Oriented to Self, Oriented to Place, Oriented to  Time, Oriented to Situation Alcohol / Substance Use: Not Applicable Psych Involvement: No (comment)  Admission diagnosis:  Left flank pain [R10.9] Acute cystitis without hematuria [N30.00] Pain of left hip [M25.552] Patient Active Problem List   Diagnosis Date Noted   Left lower quadrant abdominal pain 04/04/2023  Left flank pain 04/04/2023   Vertigo    Dizziness    Nausea    AKI (acute kidney injury) (HCC)    Hypomagnesemia    Hypokalemia    Iron deficiency anemia due to chronic blood loss    Near syncope  05/25/2021   Dyspnea on exertion 08/19/2020   Special screening for malignant neoplasms, colon    Polyp of transverse colon    DVT (deep venous thrombosis) (HCC) 11/08/2019   Burning sensation 10/17/2019   Itchy scalp 10/17/2019   Occipital neuralgia of right side 10/17/2019   Tingling 10/17/2019   Hyperlipidemia, mixed 10/10/2016   Stable angina pectoris 10/10/2016   Adaptation reaction 01/27/2015   Back pain, chronic 01/27/2015   CN (constipation) 01/27/2015   Abnormal LFTs 01/27/2015   Essential hypertension 01/27/2015   Gastro-esophageal reflux disease without esophagitis 01/27/2015   Personal history of arthritis 01/27/2015   Insomnia 01/27/2015   Scoliosis 01/27/2015   FOM (frequency of micturition) 01/27/2015   PCP:  [facility] Pharmacy:   [facility] - [place], [place] - [address] [place] [address] Phone: [phone] Fax: [phone]  [facility] [address], [place] - [address] [place] [address] Phone: [phone] Fax: [phone]     Social Determinants of Health (SDOH) Social History: SDOH Screenings   Food Insecurity: No Food Insecurity (04/04/2023)  Housing: Low Risk  (04/04/2023)  Transportation Needs: No Transportation Needs (04/04/2023)  Utilities: Not At Risk (04/04/2023)  Alcohol Screen: Low Risk  (05/13/2020)  Depression (PHQ2-9): Low Risk  (05/13/2020)  Financial Resource Strain: Low Risk  (04/19/2022)   [facility]  Tobacco Use: Low Risk  (04/03/2023)   SDOH Interventions:     Readmission Risk Interventions     No data to display

## 2023-04-07 ENCOUNTER — Telehealth: Payer: Self-pay | Admitting: Student

## 2023-04-07 DIAGNOSIS — R1032 Left lower quadrant pain: Secondary | ICD-10-CM | POA: Diagnosis not present

## 2023-04-07 DIAGNOSIS — T148XXA Other injury of unspecified body region, initial encounter: Secondary | ICD-10-CM

## 2023-04-07 LAB — BASIC METABOLIC PANEL
Anion gap: 12 (ref 5–15)
BUN: 15 mg/dL (ref 8–23)
CO2: 23 mmol/L (ref 22–32)
Calcium: 8.2 mg/dL — ABNORMAL LOW (ref 8.9–10.3)
Chloride: 104 mmol/L (ref 98–111)
Creatinine, Ser: 0.75 mg/dL (ref 0.44–1.00)
GFR, Estimated: >60 mL/min (ref >60)
Glucose, Bld: 118 mg/dL — ABNORMAL HIGH (ref 70–99)
Potassium: 3.7 mmol/L (ref 3.5–5.1)
Sodium: 139 mmol/L (ref 135–145)

## 2023-04-07 LAB — CBC
HCT: 34.7 % — ABNORMAL LOW (ref 36.0–46.0)
Hemoglobin: 11.3 g/dL — ABNORMAL LOW (ref 12.0–15.0)
MCH: 28 pg (ref 26.0–34.0)
MCHC: 32.6 g/dL (ref 30.0–36.0)
MCV: 85.9 fL (ref 80.0–100.0)
Platelets: 165 10*3/uL (ref 150–400)
RBC: 4.04 MIL/uL (ref 3.87–5.11)
RDW: 16.1 % — ABNORMAL HIGH (ref 11.5–15.5)
WBC: 8.4 10*3/uL (ref 4.0–10.5)
nRBC: 0 % (ref 0.0–0.2)

## 2023-04-07 LAB — SARS CORONAVIRUS 2 BY RT PCR: SARS Coronavirus 2 by RT PCR: NEGATIVE

## 2023-04-07 MED ORDER — OXYCODONE HCL 10 MG PO TABS: 5.0000 mg | ORAL_TABLET | Freq: Four times a day (QID) | ORAL | 0 refills | Status: DC | PRN

## 2023-04-07 MED ORDER — CEPHALEXIN 500 MG PO CAPS: 500.0000 mg | ORAL_CAPSULE | Freq: Two times a day (BID) | ORAL | Status: AC

## 2023-04-07 MED ORDER — TRIAMTERENE-HCTZ 37.5-25 MG PO TABS
1.0000 | ORAL_TABLET | Freq: Every day | ORAL | Status: DC
Start: 2023-04-07 — End: 2023-04-12

## 2023-04-07 MED ORDER — DOCUSATE SODIUM 100 MG PO CAPS: 100.0000 mg | ORAL_CAPSULE | Freq: Every day | ORAL | Status: DC

## 2023-04-07 MED ORDER — POLYETHYLENE GLYCOL 3350 17 G PO PACK: 17.0000 g | PACK | Freq: Every day | ORAL | Status: DC

## 2023-04-07 MED ORDER — ACETAMINOPHEN 325 MG PO TABS: 650.0000 mg | ORAL_TABLET | Freq: Four times a day (QID) | ORAL | Status: DC | PRN

## 2023-04-07 MED ORDER — DIPHENHYDRAMINE HCL 25 MG PO CAPS: 25.0000 mg | ORAL_CAPSULE | Freq: Four times a day (QID) | ORAL | Status: DC | PRN

## 2023-04-07 MED ORDER — METOPROLOL SUCCINATE ER 25 MG PO TB24: 12.5000 mg | ORAL_TABLET | Freq: Every day | ORAL | Status: DC

## 2023-04-07 MED ORDER — OXYCODONE HCL 5 MG PO TABS
5.0000 mg | ORAL_TABLET | ORAL | 0 refills | Status: DC | PRN
Start: 2023-04-07 — End: 2023-04-07

## 2023-04-07 MED ORDER — TRAZODONE HCL 100 MG PO TABS: 200.0000 mg | ORAL_TABLET | Freq: Every day | ORAL | Status: DC

## 2023-04-07 NOTE — Telephone Encounter (Signed)
Refill muscle tear medication upon admission to Rehab.

## 2023-04-07 NOTE — Progress Notes (Signed)
This Chap visited Pt at bedside, Pt excited that she has improved and being discharged to rehab today. Chap joined Pt to celebrate this healing as well as Pt reunion with family. Pt grateful for Chap stopping by to see her.   04/07/23 1300  Spiritual Encounters  Type of Visit Initial  Care provided to: Patient  Referral source Chaplain assessment  Reason for visit Routine spiritual support  OnCall Visit No  Spiritual Framework  Presenting Themes Courage hope and growth

## 2023-04-07 NOTE — TOC Progression Note (Addendum)
Transition of Care [facility]) - Progression Note    Patient Details  Name: Kristi Lee MRN: [id number] Date of Birth: May 05, 1956  Transition of Care [facility]) CM/SW Contact  Margarito Liner, LCSW Phone Number: 04/07/2023, 12:09 PM  Clinical Narrative:  Kristi Lee will require COVID test prior to admission due to patient having a fever on 8/21 and facility having a COVID outbreak.   12:23 pm: CSW met with patient and made her aware of the reason for the COVID test.  12:51 pm: COVID results are negative. SNF admissions coordinator is aware.  Expected Discharge Plan: Skilled Nursing Facility Barriers to Discharge: Continued Medical Work up  Expected Discharge Plan and Services     Post Acute Care Choice: Skilled Nursing Facility Living arrangements for the past 2 months: Single Family Home Expected Discharge Date: 04/07/23                                     Social Determinants of Health (SDOH) Interventions SDOH Screenings   Food Insecurity: No Food Insecurity (04/04/2023)  Housing: Low Risk  (04/04/2023)  Transportation Needs: No Transportation Needs (04/04/2023)  Utilities: Not At Risk (04/04/2023)  Alcohol Screen: Low Risk  (05/13/2020)  Depression (PHQ2-9): Low Risk  (05/13/2020)  Financial Resource Strain: Low Risk  (04/19/2022)   Received from [facility]  Tobacco Use: Low Risk  (04/03/2023)    Readmission Risk Interventions     No data to display

## 2023-04-07 NOTE — Plan of Care (Signed)
Discharge cancelled - sinus tachycardia, EMS would not take patient, will cancel d/c order and monitor overnight

## 2023-04-07 NOTE — Plan of Care (Signed)

## 2023-04-07 NOTE — Discharge Summary (Signed)
Physician Discharge Summary   Patient: Kristi Lee MRN: [id number]  DOB: 22-May-1956   Admit:     Date of Admission: 04/03/2023 Admitted from: home   Discharge: Date of discharge: 04/07/23 Disposition: Skilled nursing facility Condition at discharge: good  CODE STATUS: FULL CODE     Discharge Physician: Sunnie Nielsen, DO [facility]     PCP: [facility]  Recommendations for Outpatient Follow-up:  Follow up with PCP [facility] in 2-4 weeks Follow up with orthopedics in 1 week  Please obtain labs/tests: consider CBC/BMP  in 1 week Check vitals daily, if SBP >150 restart Toprol XL at 50 mg and if remains >150 despite this can restart maxzide.  Please follow up on the following pending results: none    Discharge Instructions     Call MD for:  severe uncontrolled pain   Complete by: As directed    Call MD for:  temperature >100.4   Complete by: As directed    Diet general   Complete by: As directed    Increase activity slowly   Complete by: As directed          Discharge Diagnoses: Principal Problem:   Left lower quadrant abdominal pain Active Problems:   Abnormal LFTs   Essential hypertension   Gastro-esophageal reflux disease without esophagitis   DVT (deep venous thrombosis) (HCC)   AKI (acute kidney injury) (HCC)   Hypomagnesemia   Hypokalemia   Iron deficiency anemia due to chronic blood loss   Left flank pain       Hospital Course: Kristi Lee is a 67 y.o. female with medical history significant for scoliosis status postsurgery, congestive heart failure diastolic, DVT, GERD, hypertension presenting to ED 08/19 with left-sided groin pain. Patient was getting out of bed this morning and felt that sharp pain in the left pelvis left flank left hip area patient states the pain was 10 out of 10 it was so bad that she started to ice it she was not able to move out of her bed 08/19: in  ED, CT imaging limited, question pubic rami fx, admitted to hospitalist service and MRI ordered. Start IV abx for UTI also.  08/20: on MRI hip/pelvis, "Significant inflammation/edema and fluid involving the left iliacus muscle suggesting a muscle tear. The iliopsoas tendon appears to be intact." Per ortho, no surgical intervention needed 08/21: workign w/ PT/OT, recs for SNF, pain control has been challenging, trial IV morphine for breakthrough. TOC consult for SNF placement rehab.  08/22: overnight, febrile/tachycardic - no new BCx drawn, fever and HR responded to tylenol/pain control. Pt feeling well later today.  08/23: 24h no fever, WBC improving, no dysuria or fever. Pt prefers discharge today, [facility] can take her  Consultants:  Curbside to Orthopedics as below, no formal consult at this time   Procedures: None       ASSESSMENT & PLAN:   Left lower quadrant abdominal pain and groin pain secondary to iliacus muscle tear Dr Meriam Sprague (hospitalist) discussed the MRI findings with Dr. Leron Croak orthopedic surgeon --> patient does not require surgery and will need physical therapy and pain management. PT OT consulted --> SNF rehab  Monitor H&H closely as patient is on Xarelto, in the setting of MRI findings --> no anemia.   Iron deficiency anemia NO active blood loss at this time.  Pt sees Dr.kowalski and [facility].    Hypokalemia Replace as needed Monitor BMP   Hypovolemic  hyponatremia - resolved Monitor BMP   Hypomagnesemia Will check levels and manage.   UTI Briefly meeting sepsis criteria w/ elevated WBC + fever and tachycardia overnight 08/21-08/22 but sepsis parameters no longer met today 08/23  Rocephin --> keflex on discharge  Await cultures --> never sent on admission, unable to add on, will not recollect d/t has bene on abx   AKI (acute kidney injury) (HCC) - resolved Patient with baseline creatinine within normal limits of 0.8 however presented with creatinine of  1.2 Monitor renal function closely   DVT (deep venous thrombosis) (HCC) Factor V Leiden Continue Xarelto   Gastro-esophageal reflux disease without esophagitis PPI.  Aspiration precaution.   Essential hypertension hold hydrochlorothiazide until/unless BP improves as noted above in instructions  Conitnue metoprolol lower dose  Monitor blood pressure closely         Discharge Instructions  Allergies as of 04/07/2023   No Known Allergies      Medication List     STOP taking these medications    Victoza 18 MG/3ML Sopn Generic drug: liraglutide       TAKE these medications    acetaminophen 325 MG tablet Commonly known as: TYLENOL Take 2 tablets (650 mg total) by mouth every 6 (six) hours as needed for mild pain, moderate pain, fever or headache (or Fever >/= 101).   betamethasone dipropionate 0.05 % cream Apply 1 application topically daily as needed (psoriasis).   cephALEXin 500 MG capsule Commonly known as: KEFLEX Take 1 capsule (500 mg total) by mouth 2 (two) times daily for 7 days.   diphenhydrAMINE 25 mg capsule Commonly known as: BENADRYL Take 1 capsule (25 mg total) by mouth every 6 (six) hours as needed for itching.   docusate sodium 100 MG capsule Commonly known as: COLACE Take 1 capsule (100 mg total) by mouth daily. Start taking on: April 08, 2023   hydrocortisone cream 1 % Apply to affected area 2 times daily   meclizine 25 MG tablet Commonly known as: ANTIVERT Take 25 mg by mouth 3 (three) times daily as needed for dizziness.   metoprolol succinate 25 MG 24 hr tablet Commonly known as: TOPROL-XL Take 25 mg by mouth daily. What changed: Another medication with the same name was added. Make sure you understand how and when to take each.   metoprolol succinate 25 MG 24 hr tablet Commonly known as: TOPROL-XL Take 0.5 tablets (12.5 mg total) by mouth daily. Start taking on: April 08, 2023 What changed: You were already taking a  medication with the same name, and this prescription was added. Make sure you understand how and when to take each.   omeprazole 20 MG capsule Commonly known as: PRILOSEC Take 1 capsule (20 mg total) by mouth daily. What changed: when to take this   Oxycodone HCl 10 MG Tabs Take 0.5-1 tablets (5-10 mg total) by mouth every 6 (six) hours as needed for severe pain or moderate pain.   polyethylene glycol 17 g packet Commonly known as: MIRALAX / GLYCOLAX Take 17 g by mouth daily. Start taking on: April 08, 2023   pregabalin 75 MG capsule Commonly known as: LYRICA Take 75 mg by mouth 3 (three) times daily.   rivaroxaban 20 MG Tabs tablet Commonly known as: XARELTO Take 1 tablet (20 mg total) by mouth daily with supper.   solifenacin 10 MG tablet Commonly known as: VESICARE Take 10 mg by mouth daily.   spironolactone 25 MG tablet Commonly known as: ALDACTONE Take 25 mg  by mouth daily.   traZODone 100 MG tablet Commonly known as: DESYREL Take 2 tablets (200 mg total) by mouth at bedtime.   triamterene-hydrochlorothiazide 37.5-25 MG tablet Commonly known as: MAXZIDE-25 Take 1 tablet by mouth daily. Holding for now may restart if/when SBP >150 What changed: additional instructions   venlafaxine XR 75 MG 24 hr capsule Commonly known as: EFFEXOR-XR Take 1 capsule (75 mg total) by mouth daily. What changed: when to take this   Xenical 120 MG capsule Generic drug: orlistat Take 120 mg by mouth 3 (three) times daily.         Contact information for after-discharge care     Destination     [facility] .   Service: Skilled Nursing Contact information: [address] [place] [address] [phone]                     No Known Allergies   Subjective: pt feeling okay today, pain is controlled, no subjective fever, she is eager to get to rehab if possible today    Discharge Exam: BP (!) 140/71 (BP Location: Right  Arm)   Pulse 90   Temp 98.2 F (36.8 C) (Oral)   Resp 16   Ht 5\' 6"  (1.676 m)   Wt 78.5 kg   SpO2 98%   BMI 27.93 kg/m  General: Pt is alert, awake, not in acute distress Cardiovascular: RRR, S1/S2 +, no rubs, no gallops Respiratory: CTA bilaterally, no wheezing, no rhonchi Abdominal: Soft, NT, ND, bowel sounds + Extremities: no edema, no cyanosis     The results of significant diagnostics from this hospitalization (including imaging, microbiology, ancillary and laboratory) are listed below for reference.     Microbiology: No results found for this or any previous visit (from the past 240 hour(s)).   Labs: BNP (last 3 results) No results for input(s): "BNP" in the last 8760 hours. Basic Metabolic Panel: Recent Labs  Lab 04/03/23 1937 04/04/23 0717 04/05/23 0409 04/07/23 0718  NA 132* 133* 135 139  K 3.1* 3.0* 4.9 3.7  CL 96* 101 106 104  CO2 24 24 25 23   GLUCOSE 141* 141* 169* 118*  BUN 23 22 25* 15  CREATININE 1.23* 1.04* 1.19* 0.75  CALCIUM 8.9 8.1* 8.1* 8.2*   Liver Function Tests: Recent Labs  Lab 04/03/23 1937 04/04/23 0717  AST 29 25  ALT 26 24  ALKPHOS 143* 121  BILITOT 1.0 0.9  PROT 7.3 6.6  ALBUMIN 4.0 3.3*   No results for input(s): "LIPASE", "AMYLASE" in the last 168 hours. No results for input(s): "AMMONIA" in the last 168 hours. CBC: Recent Labs  Lab 04/03/23 1937 04/04/23 0717 04/05/23 0409 04/07/23 0718  WBC 17.1* 12.8* 12.1* 8.4  NEUTROABS 14.2*  --  10.8*  --   HGB 13.5 12.2 12.3 11.3*  HCT 41.9 37.0 36.5 34.7*  MCV 87.5 87.1 85.7 85.9  PLT 178 131* 129* 165   Cardiac Enzymes: No results for input(s): "CKTOTAL", "CKMB", "CKMBINDEX", "TROPONINI" in the last 168 hours. BNP: Invalid input(s): "POCBNP" CBG: No results for input(s): "GLUCAP" in the last 168 hours. D-Dimer No results for input(s): "DDIMER" in the last 72 hours. Hgb A1c No results for input(s): "HGBA1C" in the last 72 hours. Lipid Profile No results for  input(s): "CHOL", "HDL", "LDLCALC", "TRIG", "CHOLHDL", "LDLDIRECT" in the last 72 hours. Thyroid function studies No results for input(s): "TSH", "T4TOTAL", "T3FREE", "THYROIDAB" in the last 72 hours.  Invalid input(s): "  FREET3" Anemia work up No results for input(s): "VITAMINB12", "FOLATE", "FERRITIN", "TIBC", "IRON", "RETICCTPCT" in the last 72 hours. Urinalysis    Component Value Date/Time   COLORURINE YELLOW (A) 04/03/2023 2134   APPEARANCEUR CLOUDY (A) 04/03/2023 2134   APPEARANCEUR Cloudy 10/25/2011 1308   LABSPEC 1.016 04/03/2023 2134   LABSPEC 1.015 10/25/2011 1308   PHURINE 5.0 04/03/2023 2134   GLUCOSEU NEGATIVE 04/03/2023 2134   GLUCOSEU Negative 10/25/2011 1308   HGBUR NEGATIVE 04/03/2023 2134   BILIRUBINUR NEGATIVE 04/03/2023 2134   BILIRUBINUR neg 10/06/2015 1521   BILIRUBINUR Negative 10/25/2011 1308   KETONESUR NEGATIVE 04/03/2023 2134   PROTEINUR 30 (A) 04/03/2023 2134   UROBILINOGEN 0.2 10/06/2015 1521   NITRITE NEGATIVE 04/03/2023 2134   LEUKOCYTESUR LARGE (A) 04/03/2023 2134   LEUKOCYTESUR Negative 10/25/2011 1308   Sepsis Labs Recent Labs  Lab 04/03/23 1937 04/04/23 0717 04/05/23 0409 04/07/23 0718  WBC 17.1* 12.8* 12.1* 8.4   Microbiology No results found for this or any previous visit (from the past 240 hour(s)). Imaging MR PELVIS WO CONTRAST  Result Date: 04/04/2023 CLINICAL DATA:  Pelvic pain. Possible pubic symphysis fracture CT scan. EXAM: MRI PELVIS WITHOUT CONTRAST, MRI LEFT HIP WITHOUT CONTRAST TECHNIQUE: Multiplanar multisequence MR imaging of the pelvis was performed. No intravenous contrast was administered. COMPARISON:  CT SCAN 04/04/2023 FINDINGS: Significant artifact associated with bilateral hip prostheses. No obvious complicating features. No periprosthetic fluid collections. The pubic symphysis and SI joints are intact. Mild degenerative changes. No findings to suggest a sacral stress or insufficiency fracture. No pubic bone fractures.  There is significant inflammation/edema and fluid involving the left iliacus muscle suggesting a muscle tear. The iliopsoas tendon appears to be intact. No significant intrapelvic abnormalities are identified. IMPRESSION: 1. Significant inflammation/edema and fluid involving the left iliacus muscle suggesting a muscle tear. The iliopsoas tendon appears to be intact. 2. Bilateral hip prostheses without obvious complicating features. No periprosthetic fluid collections. 3. No findings to suggest a sacral stress or insufficiency fracture. Electronically Signed   By: Rudie Meyer M.D.   On: 04/04/2023 07:41   MR HIP LEFT WO CONTRAST  Result Date: 04/04/2023 CLINICAL DATA:  Pelvic pain. Possible pubic symphysis fracture CT scan. EXAM: MRI PELVIS WITHOUT CONTRAST, MRI LEFT HIP WITHOUT CONTRAST TECHNIQUE: Multiplanar multisequence MR imaging of the pelvis was performed. No intravenous contrast was administered. COMPARISON:  CT SCAN 04/04/2023 FINDINGS: Significant artifact associated with bilateral hip prostheses. No obvious complicating features. No periprosthetic fluid collections. The pubic symphysis and SI joints are intact. Mild degenerative changes. No findings to suggest a sacral stress or insufficiency fracture. No pubic bone fractures. There is significant inflammation/edema and fluid involving the left iliacus muscle suggesting a muscle tear. The iliopsoas tendon appears to be intact. No significant intrapelvic abnormalities are identified. IMPRESSION: 1. Significant inflammation/edema and fluid involving the left iliacus muscle suggesting a muscle tear. The iliopsoas tendon appears to be intact. 2. Bilateral hip prostheses without obvious complicating features. No periprosthetic fluid collections. 3. No findings to suggest a sacral stress or insufficiency fracture. Electronically Signed   By: Rudie Meyer M.D.   On: 04/04/2023 07:41   CT ABDOMEN PELVIS W CONTRAST  Result Date: 04/04/2023 CLINICAL DATA:   Left lower quadrant abdominal pain EXAM: CT ABDOMEN AND PELVIS WITH CONTRAST TECHNIQUE: Multidetector CT imaging of the abdomen and pelvis was performed using the standard protocol following bolus administration of intravenous contrast. RADIATION DOSE REDUCTION: This exam was performed according to the departmental dose-optimization program which includes automated  exposure control, adjustment of the mA and/or kV according to patient size and/or use of iterative reconstruction technique. CONTRAST:  OMNIPAQUE IOHEXOL 300 MG/ML  SOLN COMPARISON:  CT pelvis 04/03/2023 FINDINGS: Lower chest: Bibasilar atelectasis. Hepatobiliary: Hepatic steatosis. Distended gallbladder. No biliary dilation or radiopaque stone. Pancreas: Unremarkable. Spleen: Unremarkable. Adrenals/Urinary Tract: Normal adrenal glands. Cortical renal scarring left kidney. No obstructing urinary calculi or hydronephrosis. Unremarkable bladder noting streak artifact from bilateral THA. Stomach/Bowel: Stomach is within normal limits other than a small hiatal hernia. No evidence of obstruction. Mobile cecum located in the central abdomen. Normal appendix. No bowel wall thickening. Vascular/Lymphatic: No significant vascular findings are present. No enlarged abdominal or pelvic lymph nodes. Reproductive: Hysterectomy. Other: No free intraperitoneal fluid or air. Musculoskeletal: Bilateral THA. Streak artifact limits assessment of the pelvis. Remote right pubic rami fractures. No evidence of acute fracture. Extensive spinal fusion hardware. IMPRESSION: 1. No acute findings in the abdomen or pelvis. 2. Hepatic steatosis. Electronically Signed   By: Minerva Fester M.D.   On: 04/04/2023 01:49   CT PELVIS WO CONTRAST  Result Date: 04/03/2023 CLINICAL DATA:  Pelvis pain, stress fracture suspected, no prior imaging EXAM: CT PELVIS WITHOUT CONTRAST TECHNIQUE: Multidetector CT imaging of the pelvis was performed following the standard protocol without  intravenous contrast. RADIATION DOSE REDUCTION: This exam was performed according to the departmental dose-optimization program which includes automated exposure control, adjustment of the mA and/or kV according to patient size and/or use of iterative reconstruction technique. COMPARISON:  None Available. FINDINGS: Urinary Tract:  No abnormality visualized. Bowel:  Unremarkable visualized pelvic bowel loops. Vascular/Lymphatic: No pathologically enlarged lymph nodes. No significant vascular abnormality seen. Reproductive:  No mass or other significant abnormality Other: No intraperitoneal free fluid. No intraperitoneal free gas. No organized fluid collection. Musculoskeletal: Limited evaluation due to streak artifact. Lumbosacral posterior and interbody surgical hardware partially visualized. Total bilateral hip arthroplasty. No CT findings suggest surgical hardware complication. Diffusely decreased bone density. No periprostatic acute fracture along the bilateral hip surgical hardware. No acute displaced fracture or diastasis of the bones of the pelvis. Old healed right inferior and superior pubic rami fractures. Poorly visualized slight periosteal reaction along the pubic symphysis (7:30) that may represent a subacute fracture. Other: None. IMPRESSION: 1. Negative for acute traumatic injury. 2. Poorly visualized slight periosteal reaction along the pubic symphysis that may represent a subacute fracture. Limited evaluation due to streak artifact. Finding would likely be better visualized on radiograph. Electronically Signed   By: Tish Frederickson M.D.   On: 04/03/2023 23:40   CT Lumbar Spine Wo Contrast  Result Date: 04/03/2023 CLINICAL DATA:  Low back pain, cauda equina syndrome suspected. Left groin and leg pain. EXAM: CT LUMBAR SPINE WITHOUT CONTRAST TECHNIQUE: Multidetector CT imaging of the lumbar spine was performed without intravenous contrast administration. Multiplanar CT image reconstructions were also  generated. RADIATION DOSE REDUCTION: This exam was performed according to the departmental dose-optimization program which includes automated exposure control, adjustment of the mA and/or kV according to patient size and/or use of iterative reconstruction technique. COMPARISON:  None Available. FINDINGS: Segmentation: 5 lumbar type vertebrae. Alignment: Severe convex leftward scoliosis.  No subluxation. Vertebrae: Diffuse severe osteopenia. No visible fracture or focal bone lesion. Paraspinal and other soft tissues: Negative Disc levels: Partial fusion across the disc spaces. Diffuse degenerative disc changes. Posterior spinal rods and fusion changes throughout the visualized lower thoracic and lumbar spine into the sacrum. IMPRESSION: Severe convex leftward scoliosis. Severe osteopenia. No acute bony abnormality. Electronically  Signed   By: Charlett Nose M.D.   On: 04/03/2023 23:34      Time coordinating discharge: over 30 minutes  SIGNED:  Sunnie Nielsen DO [facility]

## 2023-04-07 NOTE — Progress Notes (Signed)
Physical Therapy Treatment Patient Details Name: Kristi Lee MRN: [id number] DOB: 10-04-1955 Today's Date: 04/07/2023   History of Present Illness Pt is a 67 y.o. female presenting to hospital 04/03/23 with c/o sharp pain to L hip and L groin region (woke up with; no reported trauma).  Imaging showing severe convex leftward scoliosis; significant inflammation/edema and fluid involving the left iliacus muscle suggesting a muscle tear. The iliopsoas tendon appears to be intact.  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  Pt admitted with LLQ abdominal pain secondary to iliacus muscle tear, hypokalemia, and AKI.  PMH includes SOB, htn, depression, OA, anemia, L reverse shoulder arthroplasty.    PT Comments  Patient progressing towards physical therapy goals but continues to be limited by pain. Required minA for bed mobility and sit to stand from low bed surface with good hand placement. Able to take steps towards recliner with RW but limited weight bearing on L LE in standing. Instructed patient on seated exercises for LE strengthening with good tolerance. Encouraged patient to lie flat in bed at different intervals during the day for a light stretch of hip flexors to avoid postural disturbance when healed. Discharge plan remains appropriate.     If plan is discharge home, recommend the following: A lot of help with walking and/or transfers;A lot of help with bathing/dressing/bathroom;Assistance with cooking/housework;Direct supervision/assist for medications management;Direct supervision/assist for financial management;Assist for transportation;Help with stairs or ramp for entrance   Can travel by private vehicle        Equipment Recommendations  BSC/3in1;Rolling Kristi Lee (2 wheels)    Recommendations for Other Services       Precautions / Restrictions Precautions Precautions:  Fall Restrictions Weight Bearing Restrictions: No Other Position/Activity Restrictions: L iliacus muscle tear, WBAT     Mobility  Bed Mobility Overal bed mobility: Needs Assistance Bed Mobility: Supine to Sit     Supine to sit: Min assist     General bed mobility comments: assist for LLE management    Transfers Overall transfer level: Needs assistance Equipment used: Rolling Kristi Lee (2 wheels) Transfers: Sit to/from Stand Sit to Stand: Min assist           General transfer comment: assist to steady upon standing. good recall of hand placement    Ambulation/Gait Ambulation/Gait assistance: Contact guard assist Gait Distance (Feet): 6 Feet Assistive device: Rolling Kristi Lee (2 wheels) Gait Pattern/deviations: Step-to pattern, Antalgic Gait velocity: decreased     General Gait Details: Short disctance gait due to pain with limited wt bearing through L LE.   Stairs             Wheelchair Mobility     Tilt Bed    Modified Rankin (Stroke Patients Only)       Balance Overall balance assessment: Needs assistance Sitting-balance support: No upper extremity supported, Feet supported Sitting balance-Leahy Scale: Good     Standing balance support: Reliant on assistive device for balance, During functional activity, Bilateral upper extremity supported Standing balance-Leahy Scale: Fair                              Cognition Arousal: Alert Behavior During Therapy: WFL for tasks assessed/performed Overall Cognitive Status: Within Functional Limits for tasks assessed  Exercises General Exercises - Lower Extremity Long Arc Quad: AROM, Both, 10 reps, Seated Hip Flexion/Marching: AROM, Both, 10 reps, Seated Toe Raises: AROM, Both, 10 reps, Seated Heel Raises: AROM, Both, 10 reps, Seated    General Comments        Pertinent Vitals/Pain Pain Assessment Pain Assessment: Faces Faces  Pain Scale: Hurts even more Pain Location: abdominal/L groin pain Pain Descriptors / Indicators: Sharp, Grimacing, Guarding Pain Intervention(s): Monitored during session, Repositioned    Home Living                          Prior Function            PT Goals (current goals can now be found in the care plan section) Acute Rehab PT Goals Patient Stated Goal: get better so I can go home." PT Goal Formulation: With patient Time For Goal Achievement: 04/18/23 Potential to Achieve Goals: Good Progress towards PT goals: Progressing toward goals    Frequency    Min 1X/week      PT Plan      Co-evaluation              AM-PAC PT "6 Clicks" Mobility   Outcome Measure  Help needed turning from your back to your side while in a flat bed without using bedrails?: A Lot Help needed moving from lying on your back to sitting on the side of a flat bed without using bedrails?: A Lot Help needed moving to and from a bed to a chair (including a wheelchair)?: A Lot Help needed standing up from a chair using your arms (e.g., wheelchair or bedside chair)?: A Lot Help needed to walk in hospital room?: A Lot Help needed climbing 3-5 steps with a railing? : A Lot 6 Click Score: 12    End of Session   Activity Tolerance: Patient tolerated treatment well Patient left: in chair;with call bell/phone within reach;with chair alarm set Nurse Communication: Mobility status PT Visit Diagnosis: Other abnormalities of gait and mobility (R26.89);Muscle weakness (generalized) (M62.81);History of falling (Z91.81);Pain Pain - Right/Left: Left Pain - part of body:  (groin)     Time: 5784-6962 PT Time Calculation (min) (ACUTE ONLY): 18 min  Charges:    $Therapeutic Activity: 8-22 mins PT General Charges $$ ACUTE PT VISIT: 1 Visit                     Maylon Peppers, PT, DPT Physical Therapist - [facility]  [facility]    Vasilios Ottaway A Angelia Hazell 04/07/2023, 11:06  AM

## 2023-04-07 NOTE — Progress Notes (Signed)
Mobility Specialist - Progress Note     04/07/23 1203  Mobility  Activity Transferred to/from BSC;Stood at bedside  Level of Assistance Contact guard assist, steadying assist  Assistive Device Front wheel walker  Distance Ambulated (ft) 4 ft  Range of Motion/Exercises Active  Activity Response Tolerated well  Mobility Referral Yes  $Mobility charge 1 Mobility  Mobility Specialist Start Time (ACUTE ONLY) 1157  Mobility Specialist Stop Time (ACUTE ONLY) 1203  Mobility Specialist Time Calculation (min) (ACUTE ONLY) 6 min   Pt resting in recliner on RA upon entry. Pt STS and transferred to [facility] with no AD. Pt left with needs in reach and given call bell to let staff know she was ready. RN and NT notified.   Johnathan Hausen Mobility Specialist 04/07/23, 3:16 PM

## 2023-04-07 NOTE — Care Management Important Message (Signed)
Important Message  Patient Details  Name: Kristi Lee MRN: [id number] Date of Birth: [birth year]   Medicare Important Message Given:  Yes     Johnell Comings 04/07/2023, 11:27 AM

## 2023-04-07 NOTE — TOC Transition Note (Signed)
Transition of Care [facility]) - CM/SW Discharge Note   Patient Details  Name: Kristi Lee MRN: [id number] Date of Birth: 1956/02/27  [facility]) CM/SW Contact:  Margarito Liner, LCSW Phone Number: 04/07/2023, 1:57 PM   Clinical Narrative:   Patient has orders to discharge to [facility] today. RN will call report to [phone] (Room 116). EMS transport has been arranged and she is 4th on the list. No further concerns. CSW signing off.  Final next level of care: Skilled Nursing Facility Barriers to Discharge: Barriers Resolved   Patient Goals and CMS Choice CMS Medicare.gov Compare Post Acute Care list provided to:: Patient Choice offered to / list presented to : Patient, Spouse  Discharge Placement     Existing PASRR number confirmed : 04/06/23          Patient chooses bed at: [facility] Patient to be transferred to facility by: EMS   Patient and family notified of of transfer: 04/07/23  Discharge Plan and Services Additional resources added to the After Visit Summary for       Post Acute Care Choice: Skilled Nursing Facility                               Social Determinants of Health (SDOH) Interventions SDOH Screenings   Food Insecurity: No Food Insecurity (04/04/2023)  Housing: Low Risk  (04/04/2023)  Transportation Needs: No Transportation Needs (04/04/2023)  Utilities: Not At Risk (04/04/2023)  Alcohol Screen: Low Risk  (05/13/2020)  Depression (PHQ2-9): Low Risk  (05/13/2020)  Financial Resource Strain: Low Risk  (04/19/2022)   Received from [facility]  Tobacco Use: Low Risk  (04/03/2023)     Readmission Risk Interventions     No data to display

## 2023-04-07 NOTE — Progress Notes (Signed)
Upon discharge pt found to be tachy 120's, provider notified. Discharge D/C yellow muse implemented.

## 2023-04-08 DIAGNOSIS — R1032 Left lower quadrant pain: Secondary | ICD-10-CM | POA: Diagnosis not present

## 2023-04-08 MED ORDER — METOPROLOL SUCCINATE ER 25 MG PO TB24
25.0000 mg | ORAL_TABLET | Freq: Every day | ORAL | Status: DC
  Administered 2023-04-08 – 2023-04-10 (×3): 25 mg via ORAL
  Filled 2023-04-08 (×3): qty 1

## 2023-04-08 NOTE — Discharge Summary (Signed)
Physician Discharge Summary   Patient: Kristi Lee MRN: [id number]  DOB: 08-Dec-1955   Admit:     Date of Admission: 04/03/2023 Admitted from: home   Discharge: Date of discharge: 04/08/23 Disposition: Skilled nursing facility Condition at discharge: good  CODE STATUS: FULL CODE     Discharge Physician: Sunnie Nielsen, DO [facility]     PCP: [facility]  Recommendations for Outpatient Follow-up:  Follow up with PCP [facility] in 2-4 weeks Follow up with orthopedics in 1 week  Please obtain labs/tests: consider CBC/BMP  in 1 week Check vitals daily x5 days  Please follow up on the following pending results: none    Discharge Instructions     Call MD for:  severe uncontrolled pain   Complete by: As directed    Call MD for:  temperature >100.4   Complete by: As directed    Diet general   Complete by: As directed    Increase activity slowly   Complete by: As directed          Discharge Diagnoses: Principal Problem:   Left lower quadrant abdominal pain Active Problems:   Abnormal LFTs   Essential hypertension   Gastro-esophageal reflux disease without esophagitis   DVT (deep venous thrombosis) (HCC)   AKI (acute kidney injury) (HCC)   Hypomagnesemia   Hypokalemia   Iron deficiency anemia due to chronic blood loss   Left flank pain       Hospital Course: Kristi Lee is a 67 y.o. female with medical history significant for scoliosis status postsurgery, congestive heart failure diastolic, DVT, GERD, hypertension presenting to ED 08/19 with left-sided groin pain. Patient was getting out of bed this morning and felt that sharp pain in the left pelvis left flank left hip area patient states the pain was 10 out of 10 it was so bad that she started to ice it she was not able to move out of her bed 08/19: in ED, CT imaging limited, question pubic rami fx, admitted to hospitalist service and MRI  ordered. Start IV abx for UTI also.  08/20: on MRI hip/pelvis, "Significant inflammation/edema and fluid involving the left iliacus muscle suggesting a muscle tear. The iliopsoas tendon appears to be intact." Per ortho, no surgical intervention needed 08/21: workign w/ PT/OT, recs for SNF, pain control has been challenging, trial IV morphine for breakthrough. TOC consult for SNF placement rehab.  08/22: overnight, febrile/tachycardic - no new BCx drawn, fever and HR responded to tylenol/pain control. Pt feeling well later today.  08/23: 24h no fever, WBC improving, no dysuria or fever. Pt prefers discharge today, [facility] can take her. On EMS arrival pt was tachycardic, no symptoms, [facility] decliend to take her, she improved overnight w/ restarting her home dose metoprolol  08/24: stable for discharge   Consultants:  Curbside to Orthopedics as below, no formal consult at this time   Procedures: None       ASSESSMENT & PLAN:   Left lower quadrant abdominal pain and groin pain secondary to iliacus muscle tear Dr Meriam Sprague (hospitalist) discussed the MRI findings with Dr. Leron Croak orthopedic surgeon --> patient does not require surgery and will need physical therapy and pain management. PT OT consulted --> SNF rehab  Monitor H&H closely as patient is on Xarelto, in the setting of MRI findings --> no anemia.   Iron deficiency anemia NO active blood loss at this time.  Pt sees Dr.kowalski and [facility].  Hypokalemia Replace as needed Monitor BMP   Hypovolemic hyponatremia - resolved Monitor BMP   Hypomagnesemia Monitor outpatient    UTI Briefly meeting sepsis criteria w/ elevated WBC + fever and tachycardia overnight 08/21-08/22 but sepsis parameters no longer met today 08/23  Rocephin --> keflex on discharge  Await cultures --> never sent on admission, unable to add on, will not recollect d/t has bene on abx   AKI (acute kidney injury) (HCC) - resolved Patient with baseline creatinine  within normal limits of 0.8 however presented with creatinine of 1.2 Monitor renal function closely   DVT (deep venous thrombosis) (HCC) Factor V Leiden Continue Xarelto   Gastro-esophageal reflux disease without esophagitis PPI.  Aspiration precaution.   Essential hypertension hold hydrochlorothiazide until/unless BP improves as noted above in instructions  Conitnue metoprolol at usual dose  Monitor blood pressure closely         Discharge Instructions  Allergies as of 04/08/2023   No Known Allergies      Medication List     STOP taking these medications    Victoza 18 MG/3ML Sopn Generic drug: liraglutide       TAKE these medications    acetaminophen 325 MG tablet Commonly known as: TYLENOL Take 2 tablets (650 mg total) by mouth every 6 (six) hours as needed for mild pain, moderate pain, fever or headache (or Fever >/= 101).   betamethasone dipropionate 0.05 % cream Apply 1 application topically daily as needed (psoriasis).   cephALEXin 500 MG capsule Commonly known as: KEFLEX Take 1 capsule (500 mg total) by mouth 2 (two) times daily for 7 days.   diphenhydrAMINE 25 mg capsule Commonly known as: BENADRYL Take 1 capsule (25 mg total) by mouth every 6 (six) hours as needed for itching.   docusate sodium 100 MG capsule Commonly known as: COLACE Take 1 capsule (100 mg total) by mouth daily.   hydrocortisone cream 1 % Apply to affected area 2 times daily   meclizine 25 MG tablet Commonly known as: ANTIVERT Take 25 mg by mouth 3 (three) times daily as needed for dizziness.   metoprolol succinate 25 MG 24 hr tablet Commonly known as: TOPROL-XL Take 25 mg by mouth daily. What changed: Another medication with the same name was added. Make sure you understand how and when to take each.   metoprolol succinate 25 MG 24 hr tablet Commonly known as: TOPROL-XL Take 0.5 tablets (12.5 mg total) by mouth daily. What changed: You were already taking a  medication with the same name, and this prescription was added. Make sure you understand how and when to take each.   omeprazole 20 MG capsule Commonly known as: PRILOSEC Take 1 capsule (20 mg total) by mouth daily. What changed: when to take this   polyethylene glycol 17 g packet Commonly known as: MIRALAX / GLYCOLAX Take 17 g by mouth daily.   pregabalin 75 MG capsule Commonly known as: LYRICA Take 75 mg by mouth 3 (three) times daily.   rivaroxaban 20 MG Tabs tablet Commonly known as: XARELTO Take 1 tablet (20 mg total) by mouth daily with supper.   solifenacin 10 MG tablet Commonly known as: VESICARE Take 10 mg by mouth daily.   spironolactone 25 MG tablet Commonly known as: ALDACTONE Take 25 mg by mouth daily.   traZODone 100 MG tablet Commonly known as: DESYREL Take 2 tablets (200 mg total) by mouth at bedtime.   triamterene-hydrochlorothiazide 37.5-25 MG tablet Commonly known as: MAXZIDE-25 Take 1 tablet by  mouth daily. Holding for now may restart if/when SBP >150 What changed: additional instructions   venlafaxine XR 75 MG 24 hr capsule Commonly known as: EFFEXOR-XR Take 1 capsule (75 mg total) by mouth daily. What changed: when to take this   Xenical 120 MG capsule Generic drug: orlistat Take 120 mg by mouth 3 (three) times daily.         Contact information for follow-up providers     Poggi, Excell Seltzer, MD. Schedule an appointment as soon as possible for a visit.   Specialty: Orthopedic Surgery Why: Called office and they will have to work patient in. They will call her with an appointment. follow up for iliacus tear, appt in 1-2 weeks / sooner as needed Contact information: [address] [facility] [place] [address] [phone]              Contact information for after-discharge care     Destination     [facility] .   Service: Skilled Nursing Contact information: [address] [place] [address] [phone]                     No Known Allergies   Subjective: pt feeling okay today, pain is controlled, no subjective fever, she is eager to get to rehab if possible today    Discharge Exam: BP 117/80 (BP Location: Right Arm)   Pulse 89   Temp 98.3 F (36.8 C) (Oral)   Resp 18   Ht 5\' 6"  (1.676 m)   Wt 78.5 kg   SpO2 98%   BMI 27.93 kg/m  General: Pt is alert, awake, not in acute distress Cardiovascular: RRR, S1/S2 +, no rubs, no gallops Respiratory: CTA bilaterally, no wheezing, no rhonchi Abdominal: Soft, NT, ND, bowel sounds + Extremities: no edema, no cyanosis     The results of significant diagnostics from this hospitalization (including imaging, microbiology, ancillary and laboratory) are listed below for reference.     Microbiology: Recent Results (from the past 240 hour(s))  SARS Coronavirus 2 by RT PCR (hospital order, performed in [facility] hospital lab) *cepheid single result test* Anterior Nasal Swab     Status: None   Collection Time: 04/07/23 11:59 AM   Specimen: Anterior Nasal Swab  Result Value Ref Range Status   SARS Coronavirus 2 by RT PCR NEGATIVE NEGATIVE Final    Comment: (NOTE) SARS-CoV-2 target nucleic acids are NOT DETECTED.  The SARS-CoV-2 RNA is generally detectable in upper and lower respiratory specimens during the acute phase of infection. The lowest concentration of SARS-CoV-2 viral copies this assay can detect is 250 copies / mL. A negative result does not preclude SARS-CoV-2 infection and should not be used as the sole basis for treatment or other patient management decisions.  A negative result may occur with improper specimen collection / handling, submission of specimen other than nasopharyngeal swab, presence of viral mutation(s) within the areas targeted by this assay, and inadequate number of viral copies (<250 copies / mL). A negative result must be combined with clinical observations,  patient history, and epidemiological information.  Fact Sheet for Patients:   RoadLapTop.co.za  Fact Sheet for Healthcare Providers: http://kim-miller.com/  This test is not yet approved or  cleared by the [place] FDA and has been authorized for detection and/or diagnosis of SARS-CoV-2 by FDA under an Emergency Use Authorization (EUA).  This EUA will remain in effect (meaning this test can be used)  for the duration of the COVID-19 declaration under Section 564(b)(1) of the Act, 21 U.S.C. section 360bbb-3(b)(1), unless the authorization is terminated or revoked sooner.  Performed at [facility], [address] [place], [place] [address]      Labs: BNP (last 3 results) No results for input(s): "BNP" in the last 8760 hours. Basic Metabolic Panel: Recent Labs  Lab 04/03/23 1937 04/04/23 0717 04/05/23 0409 04/07/23 0718  NA 132* 133* 135 139  K 3.1* 3.0* 4.9 3.7  CL 96* 101 106 104  CO2 24 24 25 23   GLUCOSE 141* 141* 169* 118*  BUN 23 22 25* 15  CREATININE 1.23* 1.04* 1.19* 0.75  CALCIUM 8.9 8.1* 8.1* 8.2*   Liver Function Tests: Recent Labs  Lab 04/03/23 1937 04/04/23 0717  AST 29 25  ALT 26 24  ALKPHOS 143* 121  BILITOT 1.0 0.9  PROT 7.3 6.6  ALBUMIN 4.0 3.3*   No results for input(s): "LIPASE", "AMYLASE" in the last 168 hours. No results for input(s): "AMMONIA" in the last 168 hours. CBC: Recent Labs  Lab 04/03/23 1937 04/04/23 0717 04/05/23 0409 04/07/23 0718  WBC 17.1* 12.8* 12.1* 8.4  NEUTROABS 14.2*  --  10.8*  --   HGB 13.5 12.2 12.3 11.3*  HCT 41.9 37.0 36.5 34.7*  MCV 87.5 87.1 85.7 85.9  PLT 178 131* 129* 165   Cardiac Enzymes: No results for input(s): "CKTOTAL", "CKMB", "CKMBINDEX", "TROPONINI" in the last 168 hours. BNP: Invalid input(s): "POCBNP" CBG: No results for input(s): "GLUCAP" in the last 168 hours. D-Dimer No results for input(s): "DDIMER" in the last 72  hours. Hgb A1c No results for input(s): "HGBA1C" in the last 72 hours. Lipid Profile No results for input(s): "CHOL", "HDL", "LDLCALC", "TRIG", "CHOLHDL", "LDLDIRECT" in the last 72 hours. Thyroid function studies No results for input(s): "TSH", "T4TOTAL", "T3FREE", "THYROIDAB" in the last 72 hours.  Invalid input(s): "FREET3" Anemia work up No results for input(s): "VITAMINB12", "FOLATE", "FERRITIN", "TIBC", "IRON", "RETICCTPCT" in the last 72 hours. Urinalysis    Component Value Date/Time   COLORURINE YELLOW (A) 04/03/2023 2134   APPEARANCEUR CLOUDY (A) 04/03/2023 2134   APPEARANCEUR Cloudy 10/25/2011 1308   LABSPEC 1.016 04/03/2023 2134   LABSPEC 1.015 10/25/2011 1308   PHURINE 5.0 04/03/2023 2134   GLUCOSEU NEGATIVE 04/03/2023 2134   GLUCOSEU Negative 10/25/2011 1308   HGBUR NEGATIVE 04/03/2023 2134   BILIRUBINUR NEGATIVE 04/03/2023 2134   BILIRUBINUR neg 10/06/2015 1521   BILIRUBINUR Negative 10/25/2011 1308   KETONESUR NEGATIVE 04/03/2023 2134   PROTEINUR 30 (A) 04/03/2023 2134   UROBILINOGEN 0.2 10/06/2015 1521   NITRITE NEGATIVE 04/03/2023 2134   LEUKOCYTESUR LARGE (A) 04/03/2023 2134   LEUKOCYTESUR Negative 10/25/2011 1308   Sepsis Labs Recent Labs  Lab 04/03/23 1937 04/04/23 0717 04/05/23 0409 04/07/23 0718  WBC 17.1* 12.8* 12.1* 8.4   Microbiology Recent Results (from the past 240 hour(s))  SARS Coronavirus 2 by RT PCR (hospital order, performed in [facility] hospital lab) *cepheid single result test* Anterior Nasal Swab     Status: None   Collection Time: 04/07/23 11:59 AM   Specimen: Anterior Nasal Swab  Result Value Ref Range Status   SARS Coronavirus 2 by RT PCR NEGATIVE NEGATIVE Final    Comment: (NOTE) SARS-CoV-2 target nucleic acids are NOT DETECTED.  The SARS-CoV-2 RNA is generally detectable in upper and lower respiratory specimens during the acute phase of infection. The lowest concentration of SARS-CoV-2 viral copies this assay can detect  is 250 copies /  mL. A negative result does not preclude SARS-CoV-2 infection and should not be used as the sole basis for treatment or other patient management decisions.  A negative result may occur with improper specimen collection / handling, submission of specimen other than nasopharyngeal swab, presence of viral mutation(s) within the areas targeted by this assay, and inadequate number of viral copies (<250 copies / mL). A negative result must be combined with clinical observations, patient history, and epidemiological information.  Fact Sheet for Patients:   RoadLapTop.co.za  Fact Sheet for Healthcare Providers: http://kim-miller.com/  This test is not yet approved or  cleared by the [place] FDA and has been authorized for detection and/or diagnosis of SARS-CoV-2 by FDA under an Emergency Use Authorization (EUA).  This EUA will remain in effect (meaning this test can be used) for the duration of the COVID-19 declaration under Section 564(b)(1) of the Act, 21 U.S.C. section 360bbb-3(b)(1), unless the authorization is terminated or revoked sooner.  Performed at [facility], [address] [place], [place] [address]    Imaging MR PELVIS WO CONTRAST  Result Date: 04/04/2023 CLINICAL DATA:  Pelvic pain. Possible pubic symphysis fracture CT scan. EXAM: MRI PELVIS WITHOUT CONTRAST, MRI LEFT HIP WITHOUT CONTRAST TECHNIQUE: Multiplanar multisequence MR imaging of the pelvis was performed. No intravenous contrast was administered. COMPARISON:  CT SCAN 04/04/2023 FINDINGS: Significant artifact associated with bilateral hip prostheses. No obvious complicating features. No periprosthetic fluid collections. The pubic symphysis and SI joints are intact. Mild degenerative changes. No findings to suggest a sacral stress or insufficiency fracture. No pubic bone fractures. There is significant inflammation/edema and fluid involving the  left iliacus muscle suggesting a muscle tear. The iliopsoas tendon appears to be intact. No significant intrapelvic abnormalities are identified. IMPRESSION: 1. Significant inflammation/edema and fluid involving the left iliacus muscle suggesting a muscle tear. The iliopsoas tendon appears to be intact. 2. Bilateral hip prostheses without obvious complicating features. No periprosthetic fluid collections. 3. No findings to suggest a sacral stress or insufficiency fracture. Electronically Signed   By: Rudie Meyer M.D.   On: 04/04/2023 07:41   MR HIP LEFT WO CONTRAST  Result Date: 04/04/2023 CLINICAL DATA:  Pelvic pain. Possible pubic symphysis fracture CT scan. EXAM: MRI PELVIS WITHOUT CONTRAST, MRI LEFT HIP WITHOUT CONTRAST TECHNIQUE: Multiplanar multisequence MR imaging of the pelvis was performed. No intravenous contrast was administered. COMPARISON:  CT SCAN 04/04/2023 FINDINGS: Significant artifact associated with bilateral hip prostheses. No obvious complicating features. No periprosthetic fluid collections. The pubic symphysis and SI joints are intact. Mild degenerative changes. No findings to suggest a sacral stress or insufficiency fracture. No pubic bone fractures. There is significant inflammation/edema and fluid involving the left iliacus muscle suggesting a muscle tear. The iliopsoas tendon appears to be intact. No significant intrapelvic abnormalities are identified. IMPRESSION: 1. Significant inflammation/edema and fluid involving the left iliacus muscle suggesting a muscle tear. The iliopsoas tendon appears to be intact. 2. Bilateral hip prostheses without obvious complicating features. No periprosthetic fluid collections. 3. No findings to suggest a sacral stress or insufficiency fracture. Electronically Signed   By: Rudie Meyer M.D.   On: 04/04/2023 07:41   CT ABDOMEN PELVIS W CONTRAST  Result Date: 04/04/2023 CLINICAL DATA:  Left lower quadrant abdominal pain EXAM: CT ABDOMEN AND PELVIS  WITH CONTRAST TECHNIQUE: Multidetector CT imaging of the abdomen and pelvis was performed using the standard protocol following bolus administration of intravenous contrast. RADIATION DOSE REDUCTION: This exam was performed according to the departmental dose-optimization  program which includes automated exposure control, adjustment of the mA and/or kV according to patient size and/or use of iterative reconstruction technique. CONTRAST:  OMNIPAQUE IOHEXOL 300 MG/ML  SOLN COMPARISON:  CT pelvis 04/03/2023 FINDINGS: Lower chest: Bibasilar atelectasis. Hepatobiliary: Hepatic steatosis. Distended gallbladder. No biliary dilation or radiopaque stone. Pancreas: Unremarkable. Spleen: Unremarkable. Adrenals/Urinary Tract: Normal adrenal glands. Cortical renal scarring left kidney. No obstructing urinary calculi or hydronephrosis. Unremarkable bladder noting streak artifact from bilateral THA. Stomach/Bowel: Stomach is within normal limits other than a small hiatal hernia. No evidence of obstruction. Mobile cecum located in the central abdomen. Normal appendix. No bowel wall thickening. Vascular/Lymphatic: No significant vascular findings are present. No enlarged abdominal or pelvic lymph nodes. Reproductive: Hysterectomy. Other: No free intraperitoneal fluid or air. Musculoskeletal: Bilateral THA. Streak artifact limits assessment of the pelvis. Remote right pubic rami fractures. No evidence of acute fracture. Extensive spinal fusion hardware. IMPRESSION: 1. No acute findings in the abdomen or pelvis. 2. Hepatic steatosis. Electronically Signed   By: Minerva Fester M.D.   On: 04/04/2023 01:49   CT PELVIS WO CONTRAST  Result Date: 04/03/2023 CLINICAL DATA:  Pelvis pain, stress fracture suspected, no prior imaging EXAM: CT PELVIS WITHOUT CONTRAST TECHNIQUE: Multidetector CT imaging of the pelvis was performed following the standard protocol without intravenous contrast. RADIATION DOSE REDUCTION: This exam was  performed according to the departmental dose-optimization program which includes automated exposure control, adjustment of the mA and/or kV according to patient size and/or use of iterative reconstruction technique. COMPARISON:  None Available. FINDINGS: Urinary Tract:  No abnormality visualized. Bowel:  Unremarkable visualized pelvic bowel loops. Vascular/Lymphatic: No pathologically enlarged lymph nodes. No significant vascular abnormality seen. Reproductive:  No mass or other significant abnormality Other: No intraperitoneal free fluid. No intraperitoneal free gas. No organized fluid collection. Musculoskeletal: Limited evaluation due to streak artifact. Lumbosacral posterior and interbody surgical hardware partially visualized. Total bilateral hip arthroplasty. No CT findings suggest surgical hardware complication. Diffusely decreased bone density. No periprostatic acute fracture along the bilateral hip surgical hardware. No acute displaced fracture or diastasis of the bones of the pelvis. Old healed right inferior and superior pubic rami fractures. Poorly visualized slight periosteal reaction along the pubic symphysis (7:30) that may represent a subacute fracture. Other: None. IMPRESSION: 1. Negative for acute traumatic injury. 2. Poorly visualized slight periosteal reaction along the pubic symphysis that may represent a subacute fracture. Limited evaluation due to streak artifact. Finding would likely be better visualized on radiograph. Electronically Signed   By: Tish Frederickson M.D.   On: 04/03/2023 23:40   CT Lumbar Spine Wo Contrast  Result Date: 04/03/2023 CLINICAL DATA:  Low back pain, cauda equina syndrome suspected. Left groin and leg pain. EXAM: CT LUMBAR SPINE WITHOUT CONTRAST TECHNIQUE: Multidetector CT imaging of the lumbar spine was performed without intravenous contrast administration. Multiplanar CT image reconstructions were also generated. RADIATION DOSE REDUCTION: This exam was performed  according to the departmental dose-optimization program which includes automated exposure control, adjustment of the mA and/or kV according to patient size and/or use of iterative reconstruction technique. COMPARISON:  None Available. FINDINGS: Segmentation: 5 lumbar type vertebrae. Alignment: Severe convex leftward scoliosis.  No subluxation. Vertebrae: Diffuse severe osteopenia. No visible fracture or focal bone lesion. Paraspinal and other soft tissues: Negative Disc levels: Partial fusion across the disc spaces. Diffuse degenerative disc changes. Posterior spinal rods and fusion changes throughout the visualized lower thoracic and lumbar spine into the sacrum. IMPRESSION: Severe convex leftward scoliosis. Severe osteopenia. No  acute bony abnormality. Electronically Signed   By: Charlett Nose M.D.   On: 04/03/2023 23:34      Time coordinating discharge: over 30 minutes  SIGNED:  Sunnie Nielsen DO [facility]

## 2023-04-08 NOTE — Progress Notes (Signed)
Report called to [facility] at [phone] and given to [place], [place]. All questions answered.

## 2023-04-08 NOTE — TOC Progression Note (Signed)
Transition of Care [facility]) - Progression Note    Patient Details  Name: Kristi Lee MRN: [id number] Date of Birth: 1956-06-27  Transition of Care [facility]) CM/SW Contact  Kemper Durie, RN Phone Number: 04/08/2023, 11:23 AM  Clinical Narrative:     Patient has discharge orders for [facility].  Call placed to [facility] to confirm patient is able to go today, notified that she will have to wait until Monday.  Since patient did not discharge yesterday as originally planned, her medications were sent back to pharmacy and they will be be available again until Monday.    Expected Discharge Plan: Skilled Nursing Facility Barriers to Discharge: Barriers Resolved  Expected Discharge Plan and Services     Post Acute Care Choice: Skilled Nursing Facility Living arrangements for the past 2 months: Single Family Home Expected Discharge Date: 04/08/23                                     Social Determinants of Health (SDOH) Interventions SDOH Screenings   Food Insecurity: No Food Insecurity (04/04/2023)  Housing: Low Risk  (04/04/2023)  Transportation Needs: No Transportation Needs (04/04/2023)  Utilities: Not At Risk (04/04/2023)  Alcohol Screen: Low Risk  (05/13/2020)  Depression (PHQ2-9): Low Risk  (05/13/2020)  Financial Resource Strain: Low Risk  (04/19/2022)   Received from [facility]  Tobacco Use: Low Risk  (04/03/2023)    Readmission Risk Interventions     No data to display

## 2023-04-08 NOTE — Progress Notes (Signed)
Discharge again held, see TOC note

## 2023-04-08 NOTE — Plan of Care (Signed)

## 2023-04-09 MED ORDER — LORATADINE 10 MG PO TABS
10.0000 mg | ORAL_TABLET | Freq: Every day | ORAL | Status: DC
  Administered 2023-04-09 – 2023-04-10 (×2): 10 mg via ORAL
  Filled 2023-04-09 (×2): qty 1

## 2023-04-09 NOTE — Plan of Care (Signed)

## 2023-04-09 NOTE — Progress Notes (Signed)
PROGRESS NOTE    Kristi Lee   SWF:[id number] DOB: 02/16/1956  DOA: 04/03/2023 Date of Service: 04/09/23 PCP: [facility]     Brief Narrative / Hospital Course:  Kristi Lee is a 67 y.o. female with medical history significant for scoliosis status postsurgery, congestive heart failure diastolic, DVT, GERD, hypertension presenting to ED 08/19 with left-sided groin pain. Patient was getting out of bed this morning and felt that sharp pain in the left pelvis left flank left hip area patient states the pain was 10 out of 10 it was so bad that she started to ice it she was not able to move out of her bed 08/19: in ED, CT imaging limited, question pubic rami fx, admitted to hospitalist service and MRI ordered. Start IV abx for UTI also.  08/20: on MRI hip/pelvis, "Significant inflammation/edema and fluid involving the left iliacus muscle suggesting a muscle tear. The iliopsoas tendon appears to be intact." Per ortho, no surgical intervention needed 08/21: workign w/ PT/OT, recs for SNF, pain control has been challenging, trial IV morphine for breakthrough. TOC consult for SNF placement rehab.  08/22: overnight, febrile/tachycardic - no new BCx drawn, fever and HR responded to tylenol/pain control. Pt feeling well later today.  08/23: 24h no fever, WBC improving. D/c ordered but was tachycardic on EMS arrival and they would not take her, d/c cancelled 08/24 (Sat): HR improved back on home dose beta blocker. D/c ordered again but SNF could not take her despite previously telling TOC she could come Sat, d/t meds had been sent back to pharmacy when she didn't come yesterday... d/c cancelled again. Hopefully for SNF Monday  08/25 Wynelle Link): stable.   Consultants:  Curbsied to Orthopedics as below, no formal consult at this time   Procedures: None       ASSESSMENT & PLAN:   Principal Problem:   Left lower quadrant abdominal pain Active Problems:   Abnormal  LFTs   Essential hypertension   Gastro-esophageal reflux disease without esophagitis   DVT (deep venous thrombosis) (HCC)   AKI (acute kidney injury) (HCC)   Hypomagnesemia   Hypokalemia   Iron deficiency anemia due to chronic blood loss   Left flank pain  Left lower quadrant abdominal pain and groin pain secondary to iliacus muscle tear Dr Meriam Sprague (hospitalist) discussed the MRI findings 08/20 with Dr. Leron Croak orthopedic surgeon --> patient does not require surgery and will need physical therapy and pain management. PT OT consulted --> SNF rehab  Monitor H&H closely as patient is on Xarelto, in the setting of MRI findings --> no anemia.   Iron deficiency anemia NO active blood loss at this time.  Pt sees Dr.kowalski and [facility].    Hypokalemia Replace as needed Monitor BMP   Hypovolemic hyponatremia - resolved Monitor BMP   Hypomagnesemia Will check levels and manage.   UTI Briefly meeting sepsis criteria w/ elevated WBC + fever and tachycardia overnight 08/21-08/22 but sepsis parameters no longer met today 08/23  Rocephin Await cultures --> never sent on admission, unable to add on, will not recollect d/t has bene on abx   AKI (acute kidney injury) (HCC) - improving then Cr slightly up again today Patient with baseline creatinine within normal limits of 0.8 however presented with creatinine of 1.2 S/p IV fluids, repeat today 08/22 given Cr bumped up again slightly  Monitor renal function closely Avoid nephrotoxic agents   DVT (deep venous thrombosis) (HCC) Factor V Leiden Continue Xarelto   Gastro-esophageal  reflux disease without esophagitis PPI.  Aspiration precaution.   Essential hypertension hold hydrochlorothiazide  Conitnue metoprolol Monitor blood pressure closely     DVT prophylaxis: xarelto Pertinent IV fluids/nutrition: no continuous IV fluids  Central lines / invasive devices: none  Code Status: FULL CODE ACP documentation reviewed: none on file    Current Admission Status: inpatient   TOC needs / Dispo plan: SNF rehab Barriers to discharge / significant pending items: pain control              Subjective / Brief ROS:  Patient reports doing well today, no concerns Denies CP/SOB.  Pain controlled.  Denies new weakness.  Tolerating diet.  Reports no concerns w/ urination/defecation.   Family Communication: family at bedside on rounds     Objective Findings:  Vitals:   04/08/23 1854 04/08/23 2006 04/09/23 0505 04/09/23 0741  BP:  (!) 141/76 137/65 (!) 142/78  Pulse:  93 99 92  Resp:  18 18 19   Temp: 98.4 F (36.9 C) 98.6 F (37 C) 98.2 F (36.8 C) 98.7 F (37.1 C)  TempSrc: Oral Oral Oral Oral  SpO2:  100% 98% 100%  Weight:      Height:       No intake or output data in the 24 hours ending 04/09/23 1342 Filed Weights   04/03/23 1934 04/05/23 0457 04/06/23 0405  Weight: 74.8 kg 76.7 kg 78.5 kg    Examination:  Physical Exam Constitutional:      General: She is not in acute distress. Cardiovascular:     Rate and Rhythm: Normal rate.  Neurological:     General: No focal deficit present.     Mental Status: She is alert. Mental status is at baseline.  Psychiatric:        Mood and Affect: Mood normal.        Behavior: Behavior normal.          Scheduled Medications:   docusate sodium  100 mg Oral Daily   fesoterodine  4 mg Oral Daily   loratadine  10 mg Oral Daily   metoprolol succinate  25 mg Oral Daily   pantoprazole  40 mg Oral Daily   polyethylene glycol  17 g Oral Daily   pregabalin  75 mg Oral TID   rivaroxaban  20 mg Oral Q supper   sodium chloride flush  3 mL Intravenous Q12H   venlafaxine XR  75 mg Oral Q breakfast    Continuous Infusions:  cefTRIAXone (ROCEPHIN)  IV Stopped (04/08/23 2156)    PRN Medications:  acetaminophen **OR** acetaminophen, morphine injection, ondansetron, oxyCODONE, traZODone  Antimicrobials from admission:  Anti-infectives (From admission,  onward)    Start     Dose/Rate Route Frequency Ordered Stop   04/07/23 0000  cephALEXin (KEFLEX) 500 MG capsule        500 mg Oral 2 times daily 04/07/23 1057 04/14/23 2359   04/04/23 2200  cefTRIAXone (ROCEPHIN) 1 g in sodium chloride 0.9 % 100 mL IVPB        1 g 200 mL/hr over 30 Minutes Intravenous Every 24 hours 04/04/23 0133     04/04/23 0015  cefTRIAXone (ROCEPHIN) 1 g in sodium chloride 0.9 % 100 mL IVPB        1 g 200 mL/hr over 30 Minutes Intravenous  Once 04/04/23 0010 04/04/23 0118           Data Reviewed:  I have personally reviewed the following...  CBC: Recent Labs  Lab 04/03/23  1937 04/04/23 0717 04/05/23 0409 04/07/23 0718  WBC 17.1* 12.8* 12.1* 8.4  NEUTROABS 14.2*  --  10.8*  --   HGB 13.5 12.2 12.3 11.3*  HCT 41.9 37.0 36.5 34.7*  MCV 87.5 87.1 85.7 85.9  PLT 178 131* 129* 165   Basic Metabolic Panel: Recent Labs  Lab 04/03/23 1937 04/04/23 0717 04/05/23 0409 04/07/23 0718  NA 132* 133* 135 139  K 3.1* 3.0* 4.9 3.7  CL 96* 101 106 104  CO2 24 24 25 23   GLUCOSE 141* 141* 169* 118*  BUN 23 22 25* 15  CREATININE 1.23* 1.04* 1.19* 0.75  CALCIUM 8.9 8.1* 8.1* 8.2*   GFR: Estimated Creatinine Clearance: 71.2 mL/min (by C-G formula based on SCr of 0.75 mg/dL). Liver Function Tests: Recent Labs  Lab 04/03/23 1937 04/04/23 0717  AST 29 25  ALT 26 24  ALKPHOS 143* 121  BILITOT 1.0 0.9  PROT 7.3 6.6  ALBUMIN 4.0 3.3*   No results for input(s): "LIPASE", "AMYLASE" in the last 168 hours. No results for input(s): "AMMONIA" in the last 168 hours. Coagulation Profile: No results for input(s): "INR", "PROTIME" in the last 168 hours. Cardiac Enzymes: No results for input(s): "CKTOTAL", "CKMB", "CKMBINDEX", "TROPONINI" in the last 168 hours. BNP (last 3 results) No results for input(s): "PROBNP" in the last 8760 hours. HbA1C: No results for input(s): "HGBA1C" in the last 72 hours. CBG: No results for input(s): "GLUCAP" in the last 168  hours. Lipid Profile: No results for input(s): "CHOL", "HDL", "LDLCALC", "TRIG", "CHOLHDL", "LDLDIRECT" in the last 72 hours. Thyroid Function Tests: No results for input(s): "TSH", "T4TOTAL", "FREET4", "T3FREE", "THYROIDAB" in the last 72 hours. Anemia Panel: No results for input(s): "VITAMINB12", "FOLATE", "FERRITIN", "TIBC", "IRON", "RETICCTPCT" in the last 72 hours. Most Recent Urinalysis On File:     Component Value Date/Time   COLORURINE YELLOW (A) 04/03/2023 2134   APPEARANCEUR CLOUDY (A) 04/03/2023 2134   APPEARANCEUR Cloudy 10/25/2011 1308   LABSPEC 1.016 04/03/2023 2134   LABSPEC 1.015 10/25/2011 1308   PHURINE 5.0 04/03/2023 2134   GLUCOSEU NEGATIVE 04/03/2023 2134   GLUCOSEU Negative 10/25/2011 1308   HGBUR NEGATIVE 04/03/2023 2134   BILIRUBINUR NEGATIVE 04/03/2023 2134   BILIRUBINUR neg 10/06/2015 1521   BILIRUBINUR Negative 10/25/2011 1308   KETONESUR NEGATIVE 04/03/2023 2134   PROTEINUR 30 (A) 04/03/2023 2134   UROBILINOGEN 0.2 10/06/2015 1521   NITRITE NEGATIVE 04/03/2023 2134   LEUKOCYTESUR LARGE (A) 04/03/2023 2134   LEUKOCYTESUR Negative 10/25/2011 1308   Sepsis Labs: @LABRCNTIP (procalcitonin:4,lacticidven:4) Microbiology: Recent Results (from the past 240 hour(s))  SARS Coronavirus 2 by RT PCR (hospital order, performed in [facility] hospital lab) *cepheid single result test* Anterior Nasal Swab     Status: None   Collection Time: 04/07/23 11:59 AM   Specimen: Anterior Nasal Swab  Result Value Ref Range Status   SARS Coronavirus 2 by RT PCR NEGATIVE NEGATIVE Final    Comment: (NOTE) SARS-CoV-2 target nucleic acids are NOT DETECTED.  The SARS-CoV-2 RNA is generally detectable in upper and lower respiratory specimens during the acute phase of infection. The lowest concentration of SARS-CoV-2 viral copies this assay can detect is 250 copies / mL. A negative result does not preclude SARS-CoV-2 infection and should not be used as the sole basis for  treatment or other patient management decisions.  A negative result may occur with improper specimen collection / handling, submission of specimen other than nasopharyngeal swab, presence of viral mutation(s) within the areas targeted by this assay,  and inadequate number of viral copies (<250 copies / mL). A negative result must be combined with clinical observations, patient history, and epidemiological information.  Fact Sheet for Patients:   RoadLapTop.co.za  Fact Sheet for Healthcare Providers: http://kim-miller.com/  This test is not yet approved or  cleared by the [place] FDA and has been authorized for detection and/or diagnosis of SARS-CoV-2 by FDA under an Emergency Use Authorization (EUA).  This EUA will remain in effect (meaning this test can be used) for the duration of the COVID-19 declaration under Section 564(b)(1) of the Act, 21 U.S.C. section 360bbb-3(b)(1), unless the authorization is terminated or revoked sooner.  Performed at [facility] [address] [place], [place] [address]       Radiology Studies last 3 days: No results found.           LOS: 5 days      Sunnie Nielsen, DO [facility] 04/09/2023, 1:42 PM    Dictation software may have been used to generate the above note. Typos may occur and escape review in typed/dictated notes. Please contact Dr Lyn Hollingshead directly for clarity if needed.  Staff may message me via secure chat in Epic  but this may not receive an immediate response,  please page me for urgent matters!  If 7PM-7AM, please contact night coverage [email or web address]

## 2023-04-09 NOTE — Progress Notes (Signed)
Physical Therapy Treatment Patient Details Name: Kristi Lee MRN: [id number] DOB: 02-Aug-1956 Today's Date: 04/09/2023   History of Present Illness Pt is a 67 y.o. female presenting to hospital 04/03/23 with c/o sharp pain to L hip and L groin region (woke up with; no reported trauma).  Imaging showing severe convex leftward scoliosis; significant inflammation/edema and fluid involving the left iliacus muscle suggesting a muscle tear. The iliopsoas tendon appears to be intact.  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  Pt admitted with LLQ abdominal pain secondary to iliacus muscle tear, hypokalemia, and AKI.  PMH includes SOB, htn, depression, OA, anemia, L reverse shoulder arthroplasty.    PT Comments  Pt in chair, ready for session.  Stood at chairside with min a x 1.  She is able to do standing AROM LLE prior to stepping to [facility].  After voiding, she is able to do her own self care then progress gait to door and back in room with RW and contact guard.  No LOB or buckling but pain and antalgic gait remain.  She was premedicated 40 minutes before session.  Remained in chair with needs met.   If plan is discharge home, recommend the following: A little help with walking and/or transfers;A little help with bathing/dressing/bathroom;Assist for transportation;Help with stairs or ramp for entrance;Assistance with cooking/housework   Can travel by private vehicle        Equipment Recommendations  BSC/3in1;Rolling walker (2 wheels)    Recommendations for Other Services       Precautions / Restrictions Precautions Precautions: Fall Restrictions Weight Bearing Restrictions: No Other Position/Activity Restrictions: L iliacus muscle tear, WBAT     Mobility  Bed Mobility               General bed mobility comments: in recliner before and after Patient Response:  Cooperative  Transfers Overall transfer level: Needs assistance Equipment used: Rolling walker (2 wheels) Transfers: Sit to/from Stand Sit to Stand: Min assist           General transfer comment: cues for and placement    Ambulation/Gait Ambulation/Gait assistance: Contact guard assist Gait Distance (Feet): 20 Feet Assistive device: Rolling walker (2 wheels) Gait Pattern/deviations: Step-to pattern, Antalgic Gait velocity: decreased     General Gait Details: to door and back after voiding   Stairs             Wheelchair Mobility     Tilt Bed Tilt Bed Patient Response: Cooperative  Modified Rankin (Stroke Patients Only)       Balance Overall balance assessment: Needs assistance Sitting-balance support: No upper extremity supported, Feet supported Sitting balance-Leahy Scale: Good     Standing balance support: Reliant on assistive device for balance, During functional activity, Bilateral upper extremity supported Standing balance-Leahy Scale: Fair Standing balance comment: heavy lean on RW but overall does well.  cautions with WB LLE                            Cognition Arousal: Alert Behavior During Therapy: WFL for tasks assessed/performed Overall Cognitive Status: Within Functional Limits for tasks assessed                                 General Comments: Pt is A and O x 4. pleasant and  cooperative        Exercises      General Comments        Pertinent Vitals/Pain Pain Assessment Pain Assessment: Faces Faces Pain Scale: Hurts even more Pain Location: abdominal/L groin pain Pain Descriptors / Indicators: Sharp, Grimacing, Guarding Pain Intervention(s): Limited activity within patient's tolerance, Monitored during session, Premedicated before session    Home Living                          Prior Function            PT Goals (current goals can now be found in the care plan section) Progress  towards PT goals: Progressing toward goals    Frequency    Min 1X/week      PT Plan      Co-evaluation              AM-PAC PT "6 Clicks" Mobility   Outcome Measure  Help needed turning from your back to your side while in a flat bed without using bedrails?: A Lot Help needed moving from lying on your back to sitting on the side of a flat bed without using bedrails?: A Lot Help needed moving to and from a bed to a chair (including a wheelchair)?: A Little Help needed standing up from a chair using your arms (e.g., wheelchair or bedside chair)?: A Little Help needed to walk in hospital room?: A Little Help needed climbing 3-5 steps with a railing? : A Lot 6 Click Score: 15    End of Session Equipment Utilized During Treatment: Gait belt Activity Tolerance: Patient tolerated treatment well Patient left: in chair;with call bell/phone within reach;with chair alarm set Nurse Communication: Mobility status PT Visit Diagnosis: Other abnormalities of gait and mobility (R26.89);Muscle weakness (generalized) (M62.81);History of falling (Z91.81);Pain Pain - Right/Left: Left     Time: 0981-1914 PT Time Calculation (min) (ACUTE ONLY): 16 min  Charges:    $Gait Training: 8-22 mins PT General Charges $$ ACUTE PT VISIT: 1 Visit                   Danielle Dess, PTA 04/09/23, 11:07 AM

## 2023-04-10 DIAGNOSIS — R1032 Left lower quadrant pain: Secondary | ICD-10-CM | POA: Diagnosis not present

## 2023-04-10 MED ORDER — FLEET ENEMA RE ENEM
1.0000 | ENEMA | Freq: Once | RECTAL | Status: AC
  Administered 2023-04-10: 1 via RECTAL

## 2023-04-10 MED ORDER — ALPRAZOLAM 0.5 MG PO TABS
1.0000 mg | ORAL_TABLET | Freq: Once | ORAL | Status: AC
  Administered 2023-04-10: 1 mg via ORAL
  Filled 2023-04-10: qty 2

## 2023-04-10 NOTE — Discharge Summary (Addendum)
Physician Discharge Summary   Patient: Kristi Lee MRN: [id number]  DOB: 03-Sep-1955   Admit:     Date of Admission: 04/03/2023 Admitted from: home   Discharge: Date of discharge: 04/10/23 Disposition: Skilled nursing facility Condition at discharge: good  CODE STATUS: FULL CODE     Discharge Physician: Sunnie Nielsen, DO [facility]     PCP: Center, [facility]  Recommendations for Outpatient Follow-up:  Follow up with PCP Center, [facility] in 2-4 weeks Follow w orthoepdics in 1-2 weeks PT at [facility]    Discharge Instructions     Call MD for:  severe uncontrolled pain   Complete by: As directed    Call MD for:  temperature >100.4   Complete by: As directed    Diet general   Complete by: As directed    Discharge patient   Complete by: As directed    Discharge disposition: 03-Skilled Nursing Facility   Discharge patient date: 04/10/2023   Increase activity slowly   Complete by: As directed          Discharge Diagnoses: Principal Problem:   Left lower quadrant abdominal pain Active Problems:   Abnormal LFTs   Essential hypertension   Gastro-esophageal reflux disease without esophagitis   DVT (deep venous thrombosis) (HCC)   AKI (acute kidney injury) (HCC)   Hypomagnesemia   Hypokalemia   Iron deficiency anemia due to chronic blood loss   Left flank pain       Hospital Course: Kristi Lee is a 67 y.o. female with medical history significant for scoliosis status postsurgery, congestive heart failure diastolic, DVT, GERD, hypertension presenting to ED 08/19 with left-sided groin pain. Patient was getting out of bed this morning and felt that sharp pain in the left pelvis left flank left hip area patient states the pain was 10 out of 10 it was so bad that she started to ice it she was not able to move out of her bed 08/19: in ED, CT imaging limited, question pubic rami fx, admitted to hospitalist service  and MRI ordered. Start IV abx for UTI also.  08/20: on MRI hip/pelvis, "Significant inflammation/edema and fluid involving the left iliacus muscle suggesting a muscle tear. The iliopsoas tendon appears to be intact." Per ortho, no surgical intervention needed 08/21: workign w/ PT/OT, recs for SNF, pain control has been challenging, trial IV morphine for breakthrough. TOC consult for SNF placement rehab.  08/22: overnight, febrile/tachycardic - no new BCx drawn, fever and HR responded to tylenol/pain control. Pt feeling well later today.  08/23: 24h no fever, WBC improving. D/c ordered but was tachycardic on EMS arrival and they would not take her, d/c cancelled 08/24 (Sat): HR improved back on home dose beta blocker. D/c ordered again but SNF could not take her despite previously telling TOC she could come Sat, d/t meds had been sent back to pharmacy when she didn't come yesterday... d/c cancelled again. Hopefully for SNF Monday  08/25 Wynelle Link): stable.  08/26: confirmed d/c to SNF  Consultants:  Curbsied to Orthopedics as below, no formal consult at this time   Procedures: None       ASSESSMENT & PLAN:   Left lower quadrant abdominal pain and groin pain secondary to iliacus muscle tear Dr Meriam Sprague (hospitalist) discussed the MRI findings 08/20 with Dr. Leron Croak orthopedic surgeon --> patient does not require surgery and will need physical therapy and pain management. PT OT consulted --> SNF rehab  Monitor  H&H closely as patient is on Xarelto, in the setting of MRI findings --> no anemia.   Iron deficiency anemia NO active blood loss at this time.  Pt sees Dr.kowalski and [facility].    Hypokalemia Replace as needed Monitor BMP   Hypovolemic hyponatremia - resolved Monitor BMP   Hypomagnesemia Will check levels and manage.   UTI Briefly meeting sepsis criteria w/ elevated WBC + fever and tachycardia overnight 08/21-08/22 but sepsis parameters no longer met today 08/23  Rocephin Await  cultures --> never sent on admission, unable to add on, will not recollect d/t has bene on abx   AKI (acute kidney injury) (HCC) - improving then Cr slightly up again today Patient with baseline creatinine within normal limits of 0.8 however presented with creatinine of 1.2 S/p IV fluids, repeat today 08/22 given Cr bumped up again slightly  Monitor renal function closely Avoid nephrotoxic agents   DVT (deep venous thrombosis) (HCC) Factor V Leiden Continue Xarelto   Gastro-esophageal reflux disease without esophagitis PPI.  Aspiration precaution.   Essential hypertension hold hydrochlorothiazide  Conitnue metoprolol Monitor blood pressure closely              Discharge Instructions  Allergies as of 04/10/2023   No Known Allergies      Medication List     STOP taking these medications    Victoza 18 MG/3ML Sopn Generic drug: liraglutide       TAKE these medications    acetaminophen 325 MG tablet Commonly known as: TYLENOL Take 2 tablets (650 mg total) by mouth every 6 (six) hours as needed for mild pain, moderate pain, fever or headache (or Fever >/= 101).   betamethasone dipropionate 0.05 % cream Apply 1 application topically daily as needed (psoriasis).   cephALEXin 500 MG capsule Commonly known as: KEFLEX Take 1 capsule (500 mg total) by mouth 2 (two) times daily for 7 days.   diphenhydrAMINE 25 mg capsule Commonly known as: BENADRYL Take 1 capsule (25 mg total) by mouth every 6 (six) hours as needed for itching.   docusate sodium 100 MG capsule Commonly known as: COLACE Take 1 capsule (100 mg total) by mouth daily.   hydrocortisone cream 1 % Apply to affected area 2 times daily   meclizine 25 MG tablet Commonly known as: ANTIVERT Take 25 mg by mouth 3 (three) times daily as needed for dizziness.   metoprolol succinate 25 MG 24 hr tablet Commonly known as: TOPROL-XL Take 25 mg by mouth daily. What changed: Another medication with the  same name was added. Make sure you understand how and when to take each.   metoprolol succinate 25 MG 24 hr tablet Commonly known as: TOPROL-XL Take 0.5 tablets (12.5 mg total) by mouth daily. What changed: You were already taking a medication with the same name, and this prescription was added. Make sure you understand how and when to take each.   omeprazole 20 MG capsule Commonly known as: PRILOSEC Take 1 capsule (20 mg total) by mouth daily. What changed: when to take this   polyethylene glycol 17 g packet Commonly known as: MIRALAX / GLYCOLAX Take 17 g by mouth daily.   pregabalin 75 MG capsule Commonly known as: LYRICA Take 75 mg by mouth 3 (three) times daily.   rivaroxaban 20 MG Tabs tablet Commonly known as: XARELTO Take 1 tablet (20 mg total) by mouth daily with supper.   solifenacin 10 MG tablet Commonly known as: VESICARE Take 10 mg by mouth daily.  spironolactone 25 MG tablet Commonly known as: ALDACTONE Take 25 mg by mouth daily.   traZODone 100 MG tablet Commonly known as: DESYREL Take 2 tablets (200 mg total) by mouth at bedtime.   triamterene-hydrochlorothiazide 37.5-25 MG tablet Commonly known as: MAXZIDE-25 Take 1 tablet by mouth daily. Holding for now may restart if/when SBP >150 What changed: additional instructions   venlafaxine XR 75 MG 24 hr capsule Commonly known as: EFFEXOR-XR Take 1 capsule (75 mg total) by mouth daily. What changed: when to take this   Xenical 120 MG capsule Generic drug: orlistat Take 120 mg by mouth 3 (three) times daily.         Contact information for follow-up providers     Poggi, Excell Seltzer, MD. Schedule an appointment as soon as possible for a visit.   Specialty: Orthopedic Surgery Why: Called office and they will have to work patient in. They will call her with an appointment. follow up for iliacus tear, appt in 1-2 weeks / sooner as needed Contact information: [address] [facility]  [place] [address] [phone]              Contact information for after-discharge care     Destination     [facility] .   Service: Skilled Nursing Contact information: [address] [place] [address] [phone]                     No Known Allergies   Subjective: pt feeling good this morning, notes a very slight edema lower extermities butnot painful, no CP/SOB. Offered [place] but pt decliens this does not feel like previous DVT and she's been on her Xarelto    Discharge Exam: BP 129/65 (BP Location: Right Arm)   Pulse 95   Temp 98.7 F (37.1 C) (Oral)   Resp 18   Ht 5\' 6"  (1.676 m)   Wt 78.5 kg   SpO2 96%   BMI 27.93 kg/m  General: Pt is alert, awake, not in acute distress Cardiovascular: RRR, S1/S2 +, no rubs, no gallops Respiratory: CTA bilaterally, no wheezing, no rhonchi Abdominal: Soft, NT, ND, bowel sounds + Extremities: trace bilateral LE edema slightly worse on LLE but neh Homan's and no tenderness, no cyanosis     The results of significant diagnostics from this hospitalization (including imaging, microbiology, ancillary and laboratory) are listed below for reference.     Microbiology: Recent Results (from the past 240 hour(s))  SARS Coronavirus 2 by RT PCR (hospital order, performed in [facility] hospital lab) *cepheid single result test* Anterior Nasal Swab     Status: None   Collection Time: 04/07/23 11:59 AM   Specimen: Anterior Nasal Swab  Result Value Ref Range Status   SARS Coronavirus 2 by RT PCR NEGATIVE NEGATIVE Final    Comment: (NOTE) SARS-CoV-2 target nucleic acids are NOT DETECTED.  The SARS-CoV-2 RNA is generally detectable in upper and lower respiratory specimens during the acute phase of infection. The lowest concentration of SARS-CoV-2 viral copies this assay can detect is 250 copies / mL. A negative result does not preclude SARS-CoV-2 infection and should not be  used as the sole basis for treatment or other patient management decisions.  A negative result may occur with improper specimen collection / handling, submission of specimen other than nasopharyngeal swab, presence of viral mutation(s) within the areas targeted by this assay, and inadequate number of viral copies (<250 copies / mL). A  negative result must be combined with clinical observations, patient history, and epidemiological information.  Fact Sheet for Patients:   RoadLapTop.co.za  Fact Sheet for Healthcare Providers: http://kim-miller.com/  This test is not yet approved or  cleared by the [place] FDA and has been authorized for detection and/or diagnosis of SARS-CoV-2 by FDA under an Emergency Use Authorization (EUA).  This EUA will remain in effect (meaning this test can be used) for the duration of the COVID-19 declaration under Section 564(b)(1) of the Act, 21 U.S.C. section 360bbb-3(b)(1), unless the authorization is terminated or revoked sooner.  Performed at [facility], [address] [place], [place] [address]      Labs: BNP (last 3 results) No results for input(s): "BNP" in the last 8760 hours. Basic Metabolic Panel: Recent Labs  Lab 04/03/23 1937 04/04/23 0717 04/05/23 0409 04/07/23 0718  NA 132* 133* 135 139  K 3.1* 3.0* 4.9 3.7  CL 96* 101 106 104  CO2 24 24 25 23   GLUCOSE 141* 141* 169* 118*  BUN 23 22 25* 15  CREATININE 1.23* 1.04* 1.19* 0.75  CALCIUM 8.9 8.1* 8.1* 8.2*   Liver Function Tests: Recent Labs  Lab 04/03/23 1937 04/04/23 0717  AST 29 25  ALT 26 24  ALKPHOS 143* 121  BILITOT 1.0 0.9  PROT 7.3 6.6  ALBUMIN 4.0 3.3*   No results for input(s): "LIPASE", "AMYLASE" in the last 168 hours. No results for input(s): "AMMONIA" in the last 168 hours. CBC: Recent Labs  Lab 04/03/23 1937 04/04/23 0717 04/05/23 0409 04/07/23 0718  WBC 17.1* 12.8* 12.1* 8.4  NEUTROABS  14.2*  --  10.8*  --   HGB 13.5 12.2 12.3 11.3*  HCT 41.9 37.0 36.5 34.7*  MCV 87.5 87.1 85.7 85.9  PLT 178 131* 129* 165   Cardiac Enzymes: No results for input(s): "CKTOTAL", "CKMB", "CKMBINDEX", "TROPONINI" in the last 168 hours. BNP: Invalid input(s): "POCBNP" CBG: No results for input(s): "GLUCAP" in the last 168 hours. D-Dimer No results for input(s): "DDIMER" in the last 72 hours. Hgb A1c No results for input(s): "HGBA1C" in the last 72 hours. Lipid Profile No results for input(s): "CHOL", "HDL", "LDLCALC", "TRIG", "CHOLHDL", "LDLDIRECT" in the last 72 hours. Thyroid function studies No results for input(s): "TSH", "T4TOTAL", "T3FREE", "THYROIDAB" in the last 72 hours.  Invalid input(s): "FREET3" Anemia work up No results for input(s): "VITAMINB12", "FOLATE", "FERRITIN", "TIBC", "IRON", "RETICCTPCT" in the last 72 hours. Urinalysis    Component Value Date/Time   COLORURINE YELLOW (A) 04/03/2023 2134   APPEARANCEUR CLOUDY (A) 04/03/2023 2134   APPEARANCEUR Cloudy 10/25/2011 1308   LABSPEC 1.016 04/03/2023 2134   LABSPEC 1.015 10/25/2011 1308   PHURINE 5.0 04/03/2023 2134   GLUCOSEU NEGATIVE 04/03/2023 2134   GLUCOSEU Negative 10/25/2011 1308   HGBUR NEGATIVE 04/03/2023 2134   BILIRUBINUR NEGATIVE 04/03/2023 2134   BILIRUBINUR neg 10/06/2015 1521   BILIRUBINUR Negative 10/25/2011 1308   KETONESUR NEGATIVE 04/03/2023 2134   PROTEINUR 30 (A) 04/03/2023 2134   UROBILINOGEN 0.2 10/06/2015 1521   NITRITE NEGATIVE 04/03/2023 2134   LEUKOCYTESUR LARGE (A) 04/03/2023 2134   LEUKOCYTESUR Negative 10/25/2011 1308   Sepsis Labs Recent Labs  Lab 04/03/23 1937 04/04/23 0717 04/05/23 0409 04/07/23 0718  WBC 17.1* 12.8* 12.1* 8.4   Microbiology Recent Results (from the past 240 hour(s))  SARS Coronavirus 2 by RT PCR (hospital order, performed in [facility] hospital lab) *cepheid single result test* Anterior Nasal Swab     Status: None  Collection Time: 04/07/23 11:59  AM   Specimen: Anterior Nasal Swab  Result Value Ref Range Status   SARS Coronavirus 2 by RT PCR NEGATIVE NEGATIVE Final    Comment: (NOTE) SARS-CoV-2 target nucleic acids are NOT DETECTED.  The SARS-CoV-2 RNA is generally detectable in upper and lower respiratory specimens during the acute phase of infection. The lowest concentration of SARS-CoV-2 viral copies this assay can detect is 250 copies / mL. A negative result does not preclude SARS-CoV-2 infection and should not be used as the sole basis for treatment or other patient management decisions.  A negative result may occur with improper specimen collection / handling, submission of specimen other than nasopharyngeal swab, presence of viral mutation(s) within the areas targeted by this assay, and inadequate number of viral copies (<250 copies / mL). A negative result must be combined with clinical observations, patient history, and epidemiological information.  Fact Sheet for Patients:   RoadLapTop.co.za  Fact Sheet for Healthcare Providers: http://kim-miller.com/  This test is not yet approved or  cleared by the [place] FDA and has been authorized for detection and/or diagnosis of SARS-CoV-2 by FDA under an Emergency Use Authorization (EUA).  This EUA will remain in effect (meaning this test can be used) for the duration of the COVID-19 declaration under Section 564(b)(1) of the Act, 21 U.S.C. section 360bbb-3(b)(1), unless the authorization is terminated or revoked sooner.  Performed at [facility], [address] [place], [place] [address]    Imaging MR PELVIS WO CONTRAST  Result Date: 04/04/2023 CLINICAL DATA:  Pelvic pain. Possible pubic symphysis fracture CT scan. EXAM: MRI PELVIS WITHOUT CONTRAST, MRI LEFT HIP WITHOUT CONTRAST TECHNIQUE: Multiplanar multisequence MR imaging of the pelvis was performed. No intravenous contrast was administered.  COMPARISON:  CT SCAN 04/04/2023 FINDINGS: Significant artifact associated with bilateral hip prostheses. No obvious complicating features. No periprosthetic fluid collections. The pubic symphysis and SI joints are intact. Mild degenerative changes. No findings to suggest a sacral stress or insufficiency fracture. No pubic bone fractures. There is significant inflammation/edema and fluid involving the left iliacus muscle suggesting a muscle tear. The iliopsoas tendon appears to be intact. No significant intrapelvic abnormalities are identified. IMPRESSION: 1. Significant inflammation/edema and fluid involving the left iliacus muscle suggesting a muscle tear. The iliopsoas tendon appears to be intact. 2. Bilateral hip prostheses without obvious complicating features. No periprosthetic fluid collections. 3. No findings to suggest a sacral stress or insufficiency fracture. Electronically Signed   By: Rudie Meyer M.D.   On: 04/04/2023 07:41   MR HIP LEFT WO CONTRAST  Result Date: 04/04/2023 CLINICAL DATA:  Pelvic pain. Possible pubic symphysis fracture CT scan. EXAM: MRI PELVIS WITHOUT CONTRAST, MRI LEFT HIP WITHOUT CONTRAST TECHNIQUE: Multiplanar multisequence MR imaging of the pelvis was performed. No intravenous contrast was administered. COMPARISON:  CT SCAN 04/04/2023 FINDINGS: Significant artifact associated with bilateral hip prostheses. No obvious complicating features. No periprosthetic fluid collections. The pubic symphysis and SI joints are intact. Mild degenerative changes. No findings to suggest a sacral stress or insufficiency fracture. No pubic bone fractures. There is significant inflammation/edema and fluid involving the left iliacus muscle suggesting a muscle tear. The iliopsoas tendon appears to be intact. No significant intrapelvic abnormalities are identified. IMPRESSION: 1. Significant inflammation/edema and fluid involving the left iliacus muscle suggesting a muscle tear. The iliopsoas tendon  appears to be intact. 2. Bilateral hip prostheses without obvious complicating features. No periprosthetic fluid collections. 3. No findings to suggest a sacral stress or  insufficiency fracture. Electronically Signed   By: Rudie Meyer M.D.   On: 04/04/2023 07:41   CT ABDOMEN PELVIS W CONTRAST  Result Date: 04/04/2023 CLINICAL DATA:  Left lower quadrant abdominal pain EXAM: CT ABDOMEN AND PELVIS WITH CONTRAST TECHNIQUE: Multidetector CT imaging of the abdomen and pelvis was performed using the standard protocol following bolus administration of intravenous contrast. RADIATION DOSE REDUCTION: This exam was performed according to the departmental dose-optimization program which includes automated exposure control, adjustment of the mA and/or kV according to patient size and/or use of iterative reconstruction technique. CONTRAST:  OMNIPAQUE IOHEXOL 300 MG/ML  SOLN COMPARISON:  CT pelvis 04/03/2023 FINDINGS: Lower chest: Bibasilar atelectasis. Hepatobiliary: Hepatic steatosis. Distended gallbladder. No biliary dilation or radiopaque stone. Pancreas: Unremarkable. Spleen: Unremarkable. Adrenals/Urinary Tract: Normal adrenal glands. Cortical renal scarring left kidney. No obstructing urinary calculi or hydronephrosis. Unremarkable bladder noting streak artifact from bilateral THA. Stomach/Bowel: Stomach is within normal limits other than a small hiatal hernia. No evidence of obstruction. Mobile cecum located in the central abdomen. Normal appendix. No bowel wall thickening. Vascular/Lymphatic: No significant vascular findings are present. No enlarged abdominal or pelvic lymph nodes. Reproductive: Hysterectomy. Other: No free intraperitoneal fluid or air. Musculoskeletal: Bilateral THA. Streak artifact limits assessment of the pelvis. Remote right pubic rami fractures. No evidence of acute fracture. Extensive spinal fusion hardware. IMPRESSION: 1. No acute findings in the abdomen or pelvis. 2. Hepatic steatosis.  Electronically Signed   By: Minerva Fester M.D.   On: 04/04/2023 01:49   CT PELVIS WO CONTRAST  Result Date: 04/03/2023 CLINICAL DATA:  Pelvis pain, stress fracture suspected, no prior imaging EXAM: CT PELVIS WITHOUT CONTRAST TECHNIQUE: Multidetector CT imaging of the pelvis was performed following the standard protocol without intravenous contrast. RADIATION DOSE REDUCTION: This exam was performed according to the departmental dose-optimization program which includes automated exposure control, adjustment of the mA and/or kV according to patient size and/or use of iterative reconstruction technique. COMPARISON:  None Available. FINDINGS: Urinary Tract:  No abnormality visualized. Bowel:  Unremarkable visualized pelvic bowel loops. Vascular/Lymphatic: No pathologically enlarged lymph nodes. No significant vascular abnormality seen. Reproductive:  No mass or other significant abnormality Other: No intraperitoneal free fluid. No intraperitoneal free gas. No organized fluid collection. Musculoskeletal: Limited evaluation due to streak artifact. Lumbosacral posterior and interbody surgical hardware partially visualized. Total bilateral hip arthroplasty. No CT findings suggest surgical hardware complication. Diffusely decreased bone density. No periprostatic acute fracture along the bilateral hip surgical hardware. No acute displaced fracture or diastasis of the bones of the pelvis. Old healed right inferior and superior pubic rami fractures. Poorly visualized slight periosteal reaction along the pubic symphysis (7:30) that may represent a subacute fracture. Other: None. IMPRESSION: 1. Negative for acute traumatic injury. 2. Poorly visualized slight periosteal reaction along the pubic symphysis that may represent a subacute fracture. Limited evaluation due to streak artifact. Finding would likely be better visualized on radiograph. Electronically Signed   By: Tish Frederickson M.D.   On: 04/03/2023 23:40   CT Lumbar  Spine Wo Contrast  Result Date: 04/03/2023 CLINICAL DATA:  Low back pain, cauda equina syndrome suspected. Left groin and leg pain. EXAM: CT LUMBAR SPINE WITHOUT CONTRAST TECHNIQUE: Multidetector CT imaging of the lumbar spine was performed without intravenous contrast administration. Multiplanar CT image reconstructions were also generated. RADIATION DOSE REDUCTION: This exam was performed according to the departmental dose-optimization program which includes automated exposure control, adjustment of the mA and/or kV according to patient size and/or use of  iterative reconstruction technique. COMPARISON:  None Available. FINDINGS: Segmentation: 5 lumbar type vertebrae. Alignment: Severe convex leftward scoliosis.  No subluxation. Vertebrae: Diffuse severe osteopenia. No visible fracture or focal bone lesion. Paraspinal and other soft tissues: Negative Disc levels: Partial fusion across the disc spaces. Diffuse degenerative disc changes. Posterior spinal rods and fusion changes throughout the visualized lower thoracic and lumbar spine into the sacrum. IMPRESSION: Severe convex leftward scoliosis. Severe osteopenia. No acute bony abnormality. Electronically Signed   By: Charlett Nose M.D.   On: 04/03/2023 23:34      Time coordinating discharge: over 30 minutes  SIGNED:  Sunnie Nielsen DO [facility]

## 2023-04-10 NOTE — TOC Transition Note (Signed)
Transition of Care [facility]) - CM/SW Discharge Note   Patient Details  Name: Kristi Lee MRN: [id number] Date of Birth: 1956-04-24  [facility] Care [facility]) CM/SW Contact:  Chapman Fitch, RN Phone Number: 04/10/2023, 10:43 AM   Clinical Narrative:     Patient will DC to: [facility] Anticipated DC date: 04/10/23  Family notified: Husband at bedside  Transport by: [facility] after [facility]  Per MD patient ready for DC to . RN, patient, patient's family, and facility notified of DC. Discharge Summary sent to facility. RN given number for report. DC packet on chart.  TOC signing off.    Final next level of care: Skilled Nursing Facility Barriers to Discharge: Barriers Resolved   Patient Goals and CMS Choice CMS Medicare.gov Compare Post Acute Care list provided to:: Patient Choice offered to / list presented to : Patient, Spouse  Discharge Placement     Existing PASRR number confirmed : 04/06/23          Patient chooses bed at: [facility] Patient to be transferred to facility by: EMS   Patient and family notified of of transfer: 04/07/23  Discharge Plan and Services Additional resources added to the After Visit Summary for       Post Acute Care Choice: Skilled Nursing Facility                               Social Determinants of Health (SDOH) Interventions SDOH Screenings   Food Insecurity: No Food Insecurity (04/04/2023)  Housing: Low Risk  (04/04/2023)  Transportation Needs: No Transportation Needs (04/04/2023)  Utilities: Not At Risk (04/04/2023)  Alcohol Screen: Low Risk  (05/13/2020)  Depression (PHQ2-9): Low Risk  (05/13/2020)  Financial Resource Strain: Low Risk  (04/19/2022)   Received from [facility], [facility]  Tobacco Use: Low Risk  (04/03/2023)     Readmission Risk Interventions     No data to display

## 2023-04-10 NOTE — Care Management Important Message (Signed)
Important Message  Patient Details  Name: Kristi Lee MRN: [id number] Date of Birth: 18-Sep-1955   Medicare Important Message Given:  Yes     Johnell Comings 04/10/2023, 10:43 AM

## 2023-04-10 NOTE — Progress Notes (Signed)
Report called to [facility]. Need patient to have BM prior to discharge per [facility]. Patient aware.

## 2023-04-10 NOTE — Progress Notes (Signed)
Mobility Specialist - Progress Note     04/10/23 0936  Mobility  Activity Ambulated with assistance in hallway  Level of Assistance Standby assist, set-up cues, supervision of patient - no hands on  Assistive Device Front wheel walker  Distance Ambulated (ft) 18 ft  Range of Motion/Exercises Active  Activity Response Tolerated well  Mobility Referral Yes  $Mobility charge 1 Mobility   Pt resting EOB upon entry on RA. Pt STS and ambulates to hallway with RW SBA. Pt gait remains antalgic with no LOB or dizziness. Pt returns to recliner and left with needs in reach. Family at bedside.   Johnathan Hausen Mobility Specialist 04/10/23, 9:42 AM

## 2023-04-10 NOTE — Progress Notes (Signed)
Fleets enema given with great success. [facility] notified and okay for patient to discharge to them today. EMS called.

## 2023-04-11 ENCOUNTER — Other Ambulatory Visit: Payer: Self-pay | Admitting: Nurse Practitioner

## 2023-04-11 MED ORDER — PREGABALIN 75 MG PO CAPS: 75.0000 mg | ORAL_CAPSULE | Freq: Three times a day (TID) | ORAL | 0 refills | Status: DC

## 2023-04-12 ENCOUNTER — Non-Acute Institutional Stay (SKILLED_NURSING_FACILITY): Payer: Medicare Other | Admitting: Student

## 2023-04-12 ENCOUNTER — Encounter: Payer: Self-pay | Admitting: Student

## 2023-04-12 DIAGNOSIS — F339 Major depressive disorder, recurrent, unspecified: Secondary | ICD-10-CM

## 2023-04-12 DIAGNOSIS — T148XXA Other injury of unspecified body region, initial encounter: Secondary | ICD-10-CM

## 2023-04-12 DIAGNOSIS — D500 Iron deficiency anemia secondary to blood loss (chronic): Secondary | ICD-10-CM | POA: Diagnosis not present

## 2023-04-12 DIAGNOSIS — M549 Dorsalgia, unspecified: Secondary | ICD-10-CM | POA: Diagnosis not present

## 2023-04-12 DIAGNOSIS — I2089 Other forms of angina pectoris: Secondary | ICD-10-CM

## 2023-04-12 DIAGNOSIS — N179 Acute kidney failure, unspecified: Secondary | ICD-10-CM

## 2023-04-12 DIAGNOSIS — M419 Scoliosis, unspecified: Secondary | ICD-10-CM | POA: Diagnosis not present

## 2023-04-12 DIAGNOSIS — Z66 Do not resuscitate: Secondary | ICD-10-CM

## 2023-04-12 DIAGNOSIS — D6851 Activated protein C resistance: Secondary | ICD-10-CM

## 2023-04-12 DIAGNOSIS — G8929 Other chronic pain: Secondary | ICD-10-CM

## 2023-04-12 NOTE — Progress Notes (Signed)
Provider:  Dr. Earnestine Mealing Location:  Other [facility].  Nursing Home Room Number: [facility] 116A Place of Service:  SNF (31)  PCP: Center, [facility] Patient Care Team: Center, [facility] as PCP - General (General Practice) Debbe Odea, MD as PCP - Cardiology (Cardiology)  Extended Emergency Contact Information Primary Emergency Contact: Gearldine Bienenstock I Address: [address]          [place], [place] [address] [place] of [place] Home Phone: [phone] Mobile Phone: [phone] Relation: Spouse Secondary Emergency Contact: Pettigrew,Carol L Address: [address]          [place], [place] [address] [place] of [place] Home Phone: [phone] Relation: Daughter  Code Status: Full Code.  Goals of Care: Advanced Directive information    04/12/2023    9:06 AM  Advanced Directives  Does Patient Have a Medical Advance Directive? No  Would patient like information on creating a medical advance directive? No - Patient declined      Chief Complaint  Patient presents with   New Admit To SNF    Admission.     HPI: Patient is a 67 y.o. female seen today for admission to [facility].   She had left hip pain after getting out of bed one morning. She went to church and a Careers information officer. She wasn't able to really do anything because of the left leg. MRI showed inflammation and a muscle tear. PT/OT recommend SNF.   She has been in [employer]. 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 Cookin and cleaning at home. She lives with her husband at home 42 years in November. Daughter lives nearby. She manages medications at home. She drives.   Discussed patient's code status. She states, she and her husband have talked about this numerous times and the older she gets the more she is concerned she will have adverse events. Advised patient to inform her family of this decision as well and that she will receive a "DNR" form in the  mail at discharge.   Past Medical History:  Diagnosis Date   Arthritis    back   CHF (congestive heart failure) (HCC)    Depression    DVT (deep venous thrombosis) (HCC) 01/25/2020   GERD (gastroesophageal reflux disease)    Heart murmur    asymptomatic   Hypertension    PONV (postoperative nausea and vomiting)    Scoliosis    reports she has rods in her back   Past Surgical History:  Procedure Laterality Date   ABDOMINAL HYSTERECTOMY     BACK SURGERY  10/2000   for scoliosis   BREAST EXCISIONAL BIOPSY Right 1990's   NEG   BROW LIFT Bilateral 07/23/2020   Procedure: BLEPHAROPLASTY UPPER EYELID; W/EXCESS SKIN BLEPHAROPTOSIS REPAIR; RESECT EX BILATERAL;  Surgeon: Imagene Riches, MD;  Location: [facility];  Service: Ophthalmology;  Laterality: Bilateral;   COLONOSCOPY WITH PROPOFOL N/A 03/23/2020   Procedure: COLONOSCOPY WITH PROPOFOL;  Surgeon: Midge Minium, MD;  Location: [facility];  Service: Endoscopy;  Laterality: N/A;  priority 4   FEMUR FRACTURE SURGERY Right 07/15/2009   Pertrochanteric femur fracture.   HIP FRACTURE SURGERY Right    PERIPHERAL VASCULAR THROMBECTOMY Left 11/11/2019   Procedure: PERIPHERAL VASCULAR THROMBECTOMY;  Surgeon: Annice Needy, MD;  Location: [facility] INVASIVE CV LAB;  Service: Cardiovascular;  Laterality: Left;   POLYPECTOMY  03/23/2020   Procedure: POLYPECTOMY;  Surgeon: Midge Minium, MD;  Location: [facility];  Service: Endoscopy;;   REVERSE SHOULDER ARTHROPLASTY Left 07/30/2021   Procedure: Left reverse shoulder arthroplasty, biceps tenodesis;  Surgeon: Signa Kell, MD;  Location: [facility] ORS;  Service: Orthopedics;  Laterality: Left;   REVISION TOTAL HIP ARTHROPLASTY Left     reports that she has never smoked. She has never used smokeless tobacco. She reports that she does not drink alcohol and does not use drugs. Social History   Socioeconomic History   Marital status: Married    Spouse name: Not on file   Number of  children: 1   Years of education: HS   Highest education level: Not on file  Occupational History   Occupation: Arts development officer: [employer]  Tobacco Use   Smoking status: Never   Smokeless tobacco: Never  Vaping Use   Vaping status: Never Used  Substance and Sexual Activity   Alcohol use: No   Drug use: No   Sexual activity: Not on file  Other Topics Concern   Not on file  Social History Narrative   Lives at home in private residence with husband   Social Determinants of Health   Financial Resource Strain: Low Risk  (04/19/2022)   [facility]   Overall Financial Resource Strain (CARDIA)    Difficulty of Paying Living Expenses: Not hard at all  Food Insecurity: No Food Insecurity (04/04/2023)   Hunger Vital Sign    Worried About Running Out of Food in the Last Year: Never true    Ran Out of Food in the Last Year: Never true  Transportation Needs: No Transportation Needs (04/04/2023)   PRAPARE - Administrator, Civil Service (Medical): No    Lack of Transportation (Non-Medical): No  Physical Activity: Not on file  Stress: Not on file  Social Connections: Not on file  Intimate Partner Violence: Not At Risk (04/04/2023)   Humiliation, Afraid, Rape, and Kick questionnaire    Fear of Current or Ex-Partner: No    Emotionally Abused: No    Physically Abused: No    Sexually Abused: No    Functional Status Survey:    Family History  Problem Relation Age of Onset   Osteoarthritis Mother    Lung cancer Father    Diabetes Brother    Breast cancer Neg Hx     Health Maintenance  Topic Date Due   Medicare Annual Wellness (AWV)  Never done   Zoster Vaccines- Shingrix (1 of 2) Never done   Pneumonia Vaccine 65+ Years old (1 of 1 - PCV) Never done   DEXA SCAN  Never done   COVID-19 Vaccine (3 - 2023-24 season) 04/15/2022   DTaP/Tdap/Td (2 - Td or Tdap) 08/01/2022   Colonoscopy  03/24/2023   INFLUENZA VACCINE   03/16/2023   MAMMOGRAM  02/02/2024   Hepatitis C Screening  Completed   HPV VACCINES  Aged Out    No Known Allergies  Outpatient Encounter Medications as of 04/12/2023  Medication Sig   acetaminophen (TYLENOL) 325 MG tablet Take 2 tablets (650 mg total) by mouth every 6 (six) hours as needed for mild pain, moderate pain, fever or headache (or Fever >/= 101).   betamethasone dipropionate 0.05 % cream Apply 1 application topically daily as needed (psoriasis).   cephALEXin (KEFLEX) 500 MG capsule Take 1 capsule (500 mg total) by mouth 2 (two) times daily for 7 days.   cetirizine (ZYRTEC) 5 MG tablet Take 5 mg by mouth daily  as needed for allergies.   docusate sodium (COLACE) 100 MG capsule Take 1 capsule (100 mg total) by mouth daily.   hydrocortisone cream 1 % Apply to affected area 2 times daily   meclizine (ANTIVERT) 25 MG tablet Take 25 mg by mouth 3 (three) times daily as needed for dizziness.   metoprolol succinate (TOPROL-XL) 25 MG 24 hr tablet Take 25 mg by mouth daily.   metoprolol succinate (TOPROL-XL) 25 MG 24 hr tablet Take 0.5 tablets (12.5 mg total) by mouth daily.   omeprazole (PRILOSEC) 20 MG capsule Take 1 capsule (20 mg total) by mouth daily.   oxycodone (OXY-IR) 5 MG capsule Take 2.5 mg by mouth every 6 (six) hours as needed. Give one tablet by mouth every 6 hours as needed   polyethylene glycol (MIRALAX / GLYCOLAX) 17 g packet Take 17 g by mouth daily.   pregabalin (LYRICA) 75 MG capsule Take 1 capsule (75 mg total) by mouth 3 (three) times daily.   rivaroxaban (XARELTO) 20 MG TABS tablet Take 1 tablet (20 mg total) by mouth daily with supper.   solifenacin (VESICARE) 10 MG tablet Take 10 mg by mouth daily.   spironolactone (ALDACTONE) 25 MG tablet Take 25 mg by mouth daily.   traZODone (DESYREL) 100 MG tablet Take 2 tablets (200 mg total) by mouth at bedtime.   venlafaxine XR (EFFEXOR-XR) 75 MG 24 hr capsule Take 1 capsule (75 mg total) by mouth daily.   Zinc Oxide  (TRIPLE PASTE) 12.8 % ointment Apply 1 Application topically. Every shift.   [DISCONTINUED] diphenhydrAMINE (BENADRYL) 25 mg capsule Take 1 capsule (25 mg total) by mouth every 6 (six) hours as needed for itching.   [DISCONTINUED] triamterene-hydrochlorothiazide (MAXZIDE-25) 37.5-25 MG tablet Take 1 tablet by mouth daily. Holding for now may restart if/when SBP >150   [DISCONTINUED] XENICAL 120 MG capsule Take 120 mg by mouth 3 (three) times daily.   No facility-administered encounter medications on file as of 04/12/2023.    Review of Systems  Vitals:   04/12/23 0851 04/12/23 0905  BP: (!) 160/79 (!) 152/63  Pulse: 100   Resp: 18   Temp: 97.9 F (36.6 C)   SpO2: 96%   Weight: 181 lb 8 oz (82.3 kg)   Height: 5\' 6"  (1.676 m)    Body mass index is 29.29 kg/m. Physical Exam Constitutional:      Appearance: Normal appearance.  Cardiovascular:     Rate and Rhythm: Normal rate and regular rhythm.     Pulses: Normal pulses.     Heart sounds: Normal heart sounds.  Pulmonary:     Effort: Pulmonary effort is normal.     Breath sounds: Normal breath sounds.  Abdominal:     General: Abdomen is flat. Bowel sounds are normal.     Palpations: Abdomen is soft.  Skin:    General: Skin is warm.  Neurological:     General: No focal deficit present.     Mental Status: She is alert and oriented to person, place, and time.     Labs reviewed: Basic Metabolic Panel: Recent Labs    04/04/23 0717 04/05/23 0409 04/07/23 0718  NA 133* 135 139  K 3.0* 4.9 3.7  CL 101 106 104  CO2 24 25 23   GLUCOSE 141* 169* 118*  BUN 22 25* 15  CREATININE 1.04* 1.19* 0.75  CALCIUM 8.1* 8.1* 8.2*   Liver Function Tests: Recent Labs    04/03/23 1937 04/04/23 0717  AST 29 25  ALT  26 24  ALKPHOS 143* 121  BILITOT 1.0 0.9  PROT 7.3 6.6  ALBUMIN 4.0 3.3*   No results for input(s): "LIPASE", "AMYLASE" in the last 8760 hours. No results for input(s): "AMMONIA" in the last 8760 hours. CBC: Recent  Labs    04/03/23 1937 04/04/23 0717 04/05/23 0409 04/07/23 0718  WBC 17.1* 12.8* 12.1* 8.4  NEUTROABS 14.2*  --  10.8*  --   HGB 13.5 12.2 12.3 11.3*  HCT 41.9 37.0 36.5 34.7*  MCV 87.5 87.1 85.7 85.9  PLT 178 131* 129* 165   Cardiac Enzymes: No results for input(s): "CKTOTAL", "CKMB", "CKMBINDEX", "TROPONINI" in the last 8760 hours. BNP: Invalid input(s): "POCBNP" Lab Results  Component Value Date   HGBA1C 6.3 (H) 04/03/2023   Lab Results  Component Value Date   TSH 1.610 04/26/2019   No results found for: "VITAMINB12" No results found for: "FOLATE" Lab Results  Component Value Date   IRON 58 05/26/2021   TIBC 379 05/26/2021   FERRITIN 11 05/26/2021    Imaging and Procedures obtained prior to SNF admission: MR PELVIS WO CONTRAST  Result Date: 04/04/2023 CLINICAL DATA:  Pelvic pain. Possible pubic symphysis fracture CT scan. EXAM: MRI PELVIS WITHOUT CONTRAST, MRI LEFT HIP WITHOUT CONTRAST TECHNIQUE: Multiplanar multisequence MR imaging of the pelvis was performed. No intravenous contrast was administered. COMPARISON:  CT SCAN 04/04/2023 FINDINGS: Significant artifact associated with bilateral hip prostheses. No obvious complicating features. No periprosthetic fluid collections. The pubic symphysis and SI joints are intact. Mild degenerative changes. No findings to suggest a sacral stress or insufficiency fracture. No pubic bone fractures. There is significant inflammation/edema and fluid involving the left iliacus muscle suggesting a muscle tear. The iliopsoas tendon appears to be intact. No significant intrapelvic abnormalities are identified. IMPRESSION: 1. Significant inflammation/edema and fluid involving the left iliacus muscle suggesting a muscle tear. The iliopsoas tendon appears to be intact. 2. Bilateral hip prostheses without obvious complicating features. No periprosthetic fluid collections. 3. No findings to suggest a sacral stress or insufficiency fracture.  Electronically Signed   By: Rudie Meyer M.D.   On: 04/04/2023 07:41   MR HIP LEFT WO CONTRAST  Result Date: 04/04/2023 CLINICAL DATA:  Pelvic pain. Possible pubic symphysis fracture CT scan. EXAM: MRI PELVIS WITHOUT CONTRAST, MRI LEFT HIP WITHOUT CONTRAST TECHNIQUE: Multiplanar multisequence MR imaging of the pelvis was performed. No intravenous contrast was administered. COMPARISON:  CT SCAN 04/04/2023 FINDINGS: Significant artifact associated with bilateral hip prostheses. No obvious complicating features. No periprosthetic fluid collections. The pubic symphysis and SI joints are intact. Mild degenerative changes. No findings to suggest a sacral stress or insufficiency fracture. No pubic bone fractures. There is significant inflammation/edema and fluid involving the left iliacus muscle suggesting a muscle tear. The iliopsoas tendon appears to be intact. No significant intrapelvic abnormalities are identified. IMPRESSION: 1. Significant inflammation/edema and fluid involving the left iliacus muscle suggesting a muscle tear. The iliopsoas tendon appears to be intact. 2. Bilateral hip prostheses without obvious complicating features. No periprosthetic fluid collections. 3. No findings to suggest a sacral stress or insufficiency fracture. Electronically Signed   By: Rudie Meyer M.D.   On: 04/04/2023 07:41   CT ABDOMEN PELVIS W CONTRAST  Result Date: 04/04/2023 CLINICAL DATA:  Left lower quadrant abdominal pain EXAM: CT ABDOMEN AND PELVIS WITH CONTRAST TECHNIQUE: Multidetector CT imaging of the abdomen and pelvis was performed using the standard protocol following bolus administration of intravenous contrast. RADIATION DOSE REDUCTION: This exam was performed according to  the departmental dose-optimization program which includes automated exposure control, adjustment of the mA and/or kV according to patient size and/or use of iterative reconstruction technique. CONTRAST:  OMNIPAQUE IOHEXOL 300 MG/ML   SOLN COMPARISON:  CT pelvis 04/03/2023 FINDINGS: Lower chest: Bibasilar atelectasis. Hepatobiliary: Hepatic steatosis. Distended gallbladder. No biliary dilation or radiopaque stone. Pancreas: Unremarkable. Spleen: Unremarkable. Adrenals/Urinary Tract: Normal adrenal glands. Cortical renal scarring left kidney. No obstructing urinary calculi or hydronephrosis. Unremarkable bladder noting streak artifact from bilateral THA. Stomach/Bowel: Stomach is within normal limits other than a small hiatal hernia. No evidence of obstruction. Mobile cecum located in the central abdomen. Normal appendix. No bowel wall thickening. Vascular/Lymphatic: No significant vascular findings are present. No enlarged abdominal or pelvic lymph nodes. Reproductive: Hysterectomy. Other: No free intraperitoneal fluid or air. Musculoskeletal: Bilateral THA. Streak artifact limits assessment of the pelvis. Remote right pubic rami fractures. No evidence of acute fracture. Extensive spinal fusion hardware. IMPRESSION: 1. No acute findings in the abdomen or pelvis. 2. Hepatic steatosis. Electronically Signed   By: Minerva Fester M.D.   On: 04/04/2023 01:49   CT PELVIS WO CONTRAST  Result Date: 04/03/2023 CLINICAL DATA:  Pelvis pain, stress fracture suspected, no prior imaging EXAM: CT PELVIS WITHOUT CONTRAST TECHNIQUE: Multidetector CT imaging of the pelvis was performed following the standard protocol without intravenous contrast. RADIATION DOSE REDUCTION: This exam was performed according to the departmental dose-optimization program which includes automated exposure control, adjustment of the mA and/or kV according to patient size and/or use of iterative reconstruction technique. COMPARISON:  None Available. FINDINGS: Urinary Tract:  No abnormality visualized. Bowel:  Unremarkable visualized pelvic bowel loops. Vascular/Lymphatic: No pathologically enlarged lymph nodes. No significant vascular abnormality seen. Reproductive:  No mass or  other significant abnormality Other: No intraperitoneal free fluid. No intraperitoneal free gas. No organized fluid collection. Musculoskeletal: Limited evaluation due to streak artifact. Lumbosacral posterior and interbody surgical hardware partially visualized. Total bilateral hip arthroplasty. No CT findings suggest surgical hardware complication. Diffusely decreased bone density. No periprostatic acute fracture along the bilateral hip surgical hardware. No acute displaced fracture or diastasis of the bones of the pelvis. Old healed right inferior and superior pubic rami fractures. Poorly visualized slight periosteal reaction along the pubic symphysis (7:30) that may represent a subacute fracture. Other: None. IMPRESSION: 1. Negative for acute traumatic injury. 2. Poorly visualized slight periosteal reaction along the pubic symphysis that may represent a subacute fracture. Limited evaluation due to streak artifact. Finding would likely be better visualized on radiograph. Electronically Signed   By: Tish Frederickson M.D.   On: 04/03/2023 23:40   CT Lumbar Spine Wo Contrast  Result Date: 04/03/2023 CLINICAL DATA:  Low back pain, cauda equina syndrome suspected. Left groin and leg pain. EXAM: CT LUMBAR SPINE WITHOUT CONTRAST TECHNIQUE: Multidetector CT imaging of the lumbar spine was performed without intravenous contrast administration. Multiplanar CT image reconstructions were also generated. RADIATION DOSE REDUCTION: This exam was performed according to the departmental dose-optimization program which includes automated exposure control, adjustment of the mA and/or kV according to patient size and/or use of iterative reconstruction technique. COMPARISON:  None Available. FINDINGS: Segmentation: 5 lumbar type vertebrae. Alignment: Severe convex leftward scoliosis.  No subluxation. Vertebrae: Diffuse severe osteopenia. No visible fracture or focal bone lesion. Paraspinal and other soft tissues: Negative Disc  levels: Partial fusion across the disc spaces. Diffuse degenerative disc changes. Posterior spinal rods and fusion changes throughout the visualized lower thoracic and lumbar spine into the sacrum. IMPRESSION: Severe convex leftward  scoliosis. Severe osteopenia. No acute bony abnormality. Electronically Signed   By: Charlett Nose M.D.   On: 04/03/2023 23:34    Assessment/Plan Muscle tear  Chronic back pain, unspecified back location, unspecified back pain laterality  Scoliosis, unspecified scoliosis type, unspecified spinal region  Iron deficiency anemia due to chronic blood loss  DNR (do not resuscitate) - Plan: Do not attempt resuscitation (DNR)  Factor V Leiden (HCC)  Stable angina pectoris, Chronic  AKI (acute kidney injury) (HCC)  Recurrent major depressive disorder, remission status unspecified (HCC) Patient admitted for therapy after suffering a muscle tear. MOI unclear at this time, however, significant pain. Controlled with PO pain medications. Scoliosis chronic. During hospitlaization patient had an episode of tachycardia and restarted home beta blocker which continues to adequately manage her baseline tachycardia. Continue metoprolol 37.5mg  daily. Hx of Factor V Leiden, continue anticoagulation. Denies chest pain. CBC in 1 week to follow up for bleeding. AKI improved inpatient, repeat labs in 1 week. DNR per patient preference. Continue supportive care and therapy in SNF. Depression well-controlled on current regimen. Discussed decreasing dose, however, not appropriate at this time.   Family/ staff Communication: Nursing.   Labs/tests ordered: CBC and BMP in 1 week.

## 2023-04-13 LAB — CBC AND DIFFERENTIAL
HCT: 31 — AB (ref 36–46)
Hemoglobin: 9.9 — AB (ref 12.0–16.0)
Neutrophils Absolute: 7946
Platelets: 356 10*3/uL (ref 150–400)
WBC: 10.4

## 2023-04-13 LAB — BASIC METABOLIC PANEL
BUN: 16 (ref 4–21)
CO2: 28 — AB (ref 13–22)
Chloride: 108 (ref 99–108)
Creatinine: 0.8 (ref 0.5–1.1)
Glucose: 83
Potassium: 4.4 meq/L (ref 3.5–5.1)
Sodium: 144 (ref 137–147)

## 2023-04-13 LAB — COMPREHENSIVE METABOLIC PANEL
Calcium: 8.3 — AB (ref 8.7–10.7)
eGFR: 85

## 2023-04-13 LAB — CBC: RBC: 3.53 — AB (ref 3.87–5.11)

## 2023-04-14 DIAGNOSIS — D6851 Activated protein C resistance: Secondary | ICD-10-CM | POA: Insufficient documentation

## 2023-04-18 ENCOUNTER — Ambulatory Visit: Payer: Medicare Other

## 2023-04-19 ENCOUNTER — Non-Acute Institutional Stay (SKILLED_NURSING_FACILITY): Payer: Medicare Other | Admitting: Student

## 2023-04-19 ENCOUNTER — Encounter: Payer: Self-pay | Admitting: Student

## 2023-04-19 DIAGNOSIS — T148XXA Other injury of unspecified body region, initial encounter: Secondary | ICD-10-CM

## 2023-04-19 DIAGNOSIS — D500 Iron deficiency anemia secondary to blood loss (chronic): Secondary | ICD-10-CM

## 2023-04-19 DIAGNOSIS — F5101 Primary insomnia: Secondary | ICD-10-CM

## 2023-04-19 DIAGNOSIS — I824Y2 Acute embolism and thrombosis of unspecified deep veins of left proximal lower extremity: Secondary | ICD-10-CM

## 2023-04-19 DIAGNOSIS — I5033 Acute on chronic diastolic (congestive) heart failure: Secondary | ICD-10-CM

## 2023-04-19 DIAGNOSIS — N179 Acute kidney failure, unspecified: Secondary | ICD-10-CM

## 2023-04-19 DIAGNOSIS — Z66 Do not resuscitate: Secondary | ICD-10-CM

## 2023-04-19 DIAGNOSIS — M419 Scoliosis, unspecified: Secondary | ICD-10-CM

## 2023-04-19 DIAGNOSIS — F4323 Adjustment disorder with mixed anxiety and depressed mood: Secondary | ICD-10-CM | POA: Diagnosis not present

## 2023-04-19 NOTE — Progress Notes (Unsigned)
Location:  Other [place].  Nursing Home Room Number: [facility] [address] Place of Service:  SNF [phone]) Provider:  Dr. Earnestine Mealing  PCP: Center, [facility]  Patient Care Team: Center, [facility] as PCP - General (General Practice) Debbe Odea, MD as PCP - Cardiology (Cardiology)  Extended Emergency Contact Information Primary Emergency Contact: Gearldine Bienenstock I Address: [address]          [place], [place] [address] [place] of [place] Home Phone: [phone] Mobile Phone: [phone] Relation: Spouse Secondary Emergency Contact: Pettigrew,Carol L Address: [address] [place] [address]          [place], [place] [address] [place] of [place] Home Phone: [phone] Relation: Daughter  Code Status:  DNR Goals of care: Advanced Directive information    04/19/2023    1:32 PM  Advanced Directives  Does Patient Have a Medical Advance Directive? Yes  Type of Advance Directive Out of facility DNR (pink MOST or yellow form)  Does patient want to make changes to medical advance directive? No - Patient declined     Chief Complaint  Patient presents with   Discharge Note    Discharge   Acute Visit    Skin Changes.     HPI:  Pt is a 67 y.o. female seen today for Discharge and Skin Changes.   Patient is doing well. Pain is well-controlled now. Knows how to manage limitations. Ready to return home. Will continue therapy.   She has some cracked heels secondary to psorasis. They have been applying steroid cream with some improvement but not adequate success.   Past Medical History:  Diagnosis Date   Arthritis    back   CHF (congestive heart failure) (HCC)    Depression    DVT (deep venous thrombosis) (HCC) 01/25/2020   GERD (gastroesophageal reflux disease)    Heart murmur    asymptomatic   Hypertension    PONV (postoperative nausea and vomiting)    Scoliosis    reports she has rods in her back   Past Surgical History:   Procedure Laterality Date   ABDOMINAL HYSTERECTOMY     BACK SURGERY  10/2000   for scoliosis   BREAST EXCISIONAL BIOPSY Right 1990's   NEG   BROW LIFT Bilateral 07/23/2020   Procedure: BLEPHAROPLASTY UPPER EYELID; W/EXCESS SKIN BLEPHAROPTOSIS REPAIR; RESECT EX BILATERAL;  Surgeon: Imagene Riches, MD;  Location: [facility];  Service: Ophthalmology;  Laterality: Bilateral;   COLONOSCOPY WITH PROPOFOL N/A 03/23/2020   Procedure: COLONOSCOPY WITH PROPOFOL;  Surgeon: Midge Minium, MD;  Location: [facility];  Service: Endoscopy;  Laterality: N/A;  priority 4   FEMUR FRACTURE SURGERY Right 07/15/2009   Pertrochanteric femur fracture.   HIP FRACTURE SURGERY Right    PERIPHERAL VASCULAR THROMBECTOMY Left 11/11/2019   Procedure: PERIPHERAL VASCULAR THROMBECTOMY;  Surgeon: Annice Needy, MD;  Location: [facility] INVASIVE CV [facility];  Service: Cardiovascular;  Laterality: Left;   POLYPECTOMY  03/23/2020   Procedure: POLYPECTOMY;  Surgeon: Midge Minium, MD;  Location: [facility];  Service: Endoscopy;;   REVERSE SHOULDER ARTHROPLASTY Left 07/30/2021   Procedure: Left reverse shoulder arthroplasty, biceps tenodesis;  Surgeon: Signa Kell, MD;  Location: [facility] ORS;  Service: Orthopedics;  Laterality: Left;   REVISION TOTAL HIP ARTHROPLASTY Left     No Known Allergies  Outpatient Encounter Medications as of 04/19/2023  Medication Sig   acetaminophen (TYLENOL) 325 MG tablet Take 650 mg by mouth every 8 (eight) hours as needed.  betamethasone dipropionate 0.05 % cream Apply 1 application topically daily as needed (psoriasis).   cetirizine (ZYRTEC) 5 MG tablet Take 5 mg by mouth daily as needed for allergies.   docusate sodium (COLACE) 100 MG capsule Take 1 capsule (100 mg total) by mouth daily.   hydrocortisone cream 1 % Apply to affected area 2 times daily   oxycodone (OXY-IR) 5 MG capsule Take 2.5 mg by mouth every 6 (six) hours as needed. Give one tablet by mouth every 6 hours as  needed   polyethylene glycol (MIRALAX / GLYCOLAX) 17 g packet Take 17 g by mouth daily.   Zinc Oxide (TRIPLE PASTE) 12.8 % ointment Apply 1 Application topically. Every shift.   famotidine (PEPCID) 10 MG tablet Take 1 tablet (10 mg total) by mouth daily.   meclizine (ANTIVERT) 25 MG tablet Take 1 tablet (25 mg total) by mouth 3 (three) times daily as needed for dizziness.   metoprolol succinate (TOPROL-XL) 25 MG 24 hr tablet Take 1 tablet (25 mg total) by mouth daily.   metoprolol succinate (TOPROL-XL) 25 MG 24 hr tablet Take 0.5 tablets (12.5 mg total) by mouth daily.   omeprazole (PRILOSEC) 20 MG capsule Take 1 capsule (20 mg total) by mouth every other day.   pregabalin (LYRICA) 75 MG capsule Take 1 capsule (75 mg total) by mouth 3 (three) times daily for 7 days.   rivaroxaban (XARELTO) 20 MG TABS tablet Take 1 tablet (20 mg total) by mouth daily with supper.   solifenacin (VESICARE) 10 MG tablet Take 1 tablet (10 mg total) by mouth daily.   spironolactone (ALDACTONE) 25 MG tablet Take 1 tablet (25 mg total) by mouth daily.   traZODone (DESYREL) 100 MG tablet Take 2 tablets (200 mg total) by mouth at bedtime.   venlafaxine XR (EFFEXOR-XR) 75 MG 24 hr capsule Take 1 capsule (75 mg total) by mouth daily.   [DISCONTINUED] acetaminophen (TYLENOL) 325 MG tablet Take 2 tablets (650 mg total) by mouth every 6 (six) hours as needed for mild pain, moderate pain, fever or headache (or Fever >/= 101). (Patient taking differently: Take 650 mg by mouth every 8 (eight) hours as needed for mild pain, moderate pain, fever or headache (or Fever >/= 101).)   [DISCONTINUED] famotidine (PEPCID) 10 MG tablet Take 10 mg by mouth daily.   [DISCONTINUED] meclizine (ANTIVERT) 25 MG tablet Take 25 mg by mouth 3 (three) times daily as needed for dizziness.   [DISCONTINUED] metoprolol succinate (TOPROL-XL) 25 MG 24 hr tablet Take 25 mg by mouth daily.   [DISCONTINUED] metoprolol succinate (TOPROL-XL) 25 MG 24 hr tablet  Take 0.5 tablets (12.5 mg total) by mouth daily.   [DISCONTINUED] omeprazole (PRILOSEC) 20 MG capsule Take 1 capsule (20 mg total) by mouth daily. (Patient taking differently: Take 20 mg by mouth every other day.)   [DISCONTINUED] omeprazole (PRILOSEC) 20 MG capsule Take 20 mg by mouth every other day.   [DISCONTINUED] pregabalin (LYRICA) 75 MG capsule Take 1 capsule (75 mg total) by mouth 3 (three) times daily.   [DISCONTINUED] rivaroxaban (XARELTO) 20 MG TABS tablet Take 1 tablet (20 mg total) by mouth daily with supper.   [DISCONTINUED] solifenacin (VESICARE) 10 MG tablet Take 10 mg by mouth daily.   [DISCONTINUED] spironolactone (ALDACTONE) 25 MG tablet Take 25 mg by mouth daily.   [DISCONTINUED] traZODone (DESYREL) 100 MG tablet Take 2 tablets (200 mg total) by mouth at bedtime.   [DISCONTINUED] venlafaxine XR (EFFEXOR-XR) 75 MG 24 hr capsule Take 1  capsule (75 mg total) by mouth daily.   No facility-administered encounter medications on file as of 04/19/2023.    Review of Systems  Immunization History  Administered Date(s) Administered   Fluad Quad(high Dose 65+) 07/08/2020   Influenza Split 05/30/2013   Moderna Sars-Covid-2 Vaccination 10/16/2019, 11/13/2019   Tdap 08/01/2012   Pertinent  Health Maintenance Due  Topic Date Due   DEXA SCAN  Never done   INFLUENZA VACCINE  03/16/2023   Colonoscopy  03/24/2023   MAMMOGRAM  02/02/2024      05/25/2021    5:00 PM 05/25/2021   10:00 PM 05/26/2021    8:30 AM 09/05/2021    8:48 AM 07/16/2022   12:10 PM  Fall Risk  (RETIRED) Patient Fall Risk Level High fall risk High fall risk High fall risk Low fall risk Low fall risk   Functional Status Survey:    Vitals:   04/19/23 1259  BP: (!) 147/71  Pulse: 80  Resp: 18  Temp: 97.8 F (36.6 C)  SpO2: 96%  Weight: 175 lb 9.6 oz (79.7 kg)  Height: 5\' 6"  (1.676 m)   Body mass index is 28.34 kg/m. Physical Exam Constitutional:      Appearance: Normal appearance.  Cardiovascular:      Rate and Rhythm: Normal rate. Rhythm irregular.     Pulses: Normal pulses.  Pulmonary:     Effort: Pulmonary effort is normal.  Abdominal:     General: Abdomen is flat.     Palpations: Abdomen is soft.  Skin:    Comments: Bilateral heels with calluses and blood between cracks in the heels. Hemostatic.   Neurological:     Mental Status: She is alert and oriented to person, place, and time.     Labs reviewed: Recent Labs    04/04/23 0717 04/05/23 0409 04/07/23 0718 04/13/23 0000  NA 133* 135 139 144  K 3.0* 4.9 3.7 4.4  CL 101 106 104 108  CO2 24 25 23  28*  GLUCOSE 141* 169* 118*  --   BUN 22 25* 15 16  CREATININE 1.04* 1.19* 0.75 0.8  CALCIUM 8.1* 8.1* 8.2* 8.3*   Recent Labs    04/03/23 1937 04/04/23 0717  AST 29 25  ALT 26 24  ALKPHOS 143* 121  BILITOT 1.0 0.9  PROT 7.3 6.6  ALBUMIN 4.0 3.3*   Recent Labs    04/03/23 1937 04/04/23 0717 04/05/23 0409 04/07/23 0718 04/13/23 0000  WBC 17.1* 12.8* 12.1* 8.4 10.4  NEUTROABS 14.2*  --  10.8*  --  7,946.00  HGB 13.5 12.2 12.3 11.3* 9.9*  HCT 41.9 37.0 36.5 34.7* 31*  MCV 87.5 87.1 85.7 85.9  --   PLT 178 131* 129* 165 356   Lab Results  Component Value Date   TSH 1.610 04/26/2019   Lab Results  Component Value Date   HGBA1C 6.3 (H) 04/03/2023   Lab Results  Component Value Date   CHOL 189 04/26/2019   HDL 67 04/26/2019   LDLCALC 102 (H) 04/26/2019   TRIG 111 04/26/2019   CHOLHDL 2.8 04/26/2019    Significant Diagnostic Results in last 30 days:  MR PELVIS WO CONTRAST  Result Date: 04/04/2023 CLINICAL DATA:  Pelvic pain. Possible pubic symphysis fracture CT scan. EXAM: MRI PELVIS WITHOUT CONTRAST, MRI LEFT HIP WITHOUT CONTRAST TECHNIQUE: Multiplanar multisequence MR imaging of the pelvis was performed. No intravenous contrast was administered. COMPARISON:  CT SCAN 04/04/2023 FINDINGS: Significant artifact associated with bilateral hip prostheses. No obvious complicating features. No  periprosthetic fluid collections. The pubic symphysis and SI joints are intact. Mild degenerative changes. No findings to suggest a sacral stress or insufficiency fracture. No pubic bone fractures. There is significant inflammation/edema and fluid involving the left iliacus muscle suggesting a muscle tear. The iliopsoas tendon appears to be intact. No significant intrapelvic abnormalities are identified. IMPRESSION: 1. Significant inflammation/edema and fluid involving the left iliacus muscle suggesting a muscle tear. The iliopsoas tendon appears to be intact. 2. Bilateral hip prostheses without obvious complicating features. No periprosthetic fluid collections. 3. No findings to suggest a sacral stress or insufficiency fracture. Electronically Signed   By: Rudie Meyer M.D.   On: 04/04/2023 07:41   MR HIP LEFT WO CONTRAST  Result Date: 04/04/2023 CLINICAL DATA:  Pelvic pain. Possible pubic symphysis fracture CT scan. EXAM: MRI PELVIS WITHOUT CONTRAST, MRI LEFT HIP WITHOUT CONTRAST TECHNIQUE: Multiplanar multisequence MR imaging of the pelvis was performed. No intravenous contrast was administered. COMPARISON:  CT SCAN 04/04/2023 FINDINGS: Significant artifact associated with bilateral hip prostheses. No obvious complicating features. No periprosthetic fluid collections. The pubic symphysis and SI joints are intact. Mild degenerative changes. No findings to suggest a sacral stress or insufficiency fracture. No pubic bone fractures. There is significant inflammation/edema and fluid involving the left iliacus muscle suggesting a muscle tear. The iliopsoas tendon appears to be intact. No significant intrapelvic abnormalities are identified. IMPRESSION: 1. Significant inflammation/edema and fluid involving the left iliacus muscle suggesting a muscle tear. The iliopsoas tendon appears to be intact. 2. Bilateral hip prostheses without obvious complicating features. No periprosthetic fluid collections. 3. No findings  to suggest a sacral stress or insufficiency fracture. Electronically Signed   By: Rudie Meyer M.D.   On: 04/04/2023 07:41   CT ABDOMEN PELVIS W CONTRAST  Result Date: 04/04/2023 CLINICAL DATA:  Left lower quadrant abdominal pain EXAM: CT ABDOMEN AND PELVIS WITH CONTRAST TECHNIQUE: Multidetector CT imaging of the abdomen and pelvis was performed using the standard protocol following bolus administration of intravenous contrast. RADIATION DOSE REDUCTION: This exam was performed according to the departmental dose-optimization program which includes automated exposure control, adjustment of the mA and/or kV according to patient size and/or use of iterative reconstruction technique. CONTRAST:  OMNIPAQUE IOHEXOL 300 MG/ML  SOLN COMPARISON:  CT pelvis 04/03/2023 FINDINGS: Lower chest: Bibasilar atelectasis. Hepatobiliary: Hepatic steatosis. Distended gallbladder. No biliary dilation or radiopaque stone. Pancreas: Unremarkable. Spleen: Unremarkable. Adrenals/Urinary Tract: Normal adrenal glands. Cortical renal scarring left kidney. No obstructing urinary calculi or hydronephrosis. Unremarkable bladder noting streak artifact from bilateral THA. Stomach/Bowel: Stomach is within normal limits other than a small hiatal hernia. No evidence of obstruction. Mobile cecum located in the central abdomen. Normal appendix. No bowel wall thickening. Vascular/Lymphatic: No significant vascular findings are present. No enlarged abdominal or pelvic lymph nodes. Reproductive: Hysterectomy. Other: No free intraperitoneal fluid or air. Musculoskeletal: Bilateral THA. Streak artifact limits assessment of the pelvis. Remote right pubic rami fractures. No evidence of acute fracture. Extensive spinal fusion hardware. IMPRESSION: 1. No acute findings in the abdomen or pelvis. 2. Hepatic steatosis. Electronically Signed   By: Minerva Fester M.D.   On: 04/04/2023 01:49   CT PELVIS WO CONTRAST  Result Date: 04/03/2023 CLINICAL DATA:   Pelvis pain, stress fracture suspected, no prior imaging EXAM: CT PELVIS WITHOUT CONTRAST TECHNIQUE: Multidetector CT imaging of the pelvis was performed following the standard protocol without intravenous contrast. RADIATION DOSE REDUCTION: This exam was performed according to the departmental dose-optimization program which includes automated exposure control, adjustment  of the mA and/or kV according to patient size and/or use of iterative reconstruction technique. COMPARISON:  None Available. FINDINGS: Urinary Tract:  No abnormality visualized. Bowel:  Unremarkable visualized pelvic bowel loops. Vascular/Lymphatic: No pathologically enlarged lymph nodes. No significant vascular abnormality seen. Reproductive:  No mass or other significant abnormality Other: No intraperitoneal free fluid. No intraperitoneal free gas. No organized fluid collection. Musculoskeletal: Limited evaluation due to streak artifact. Lumbosacral posterior and interbody surgical hardware partially visualized. Total bilateral hip arthroplasty. No CT findings suggest surgical hardware complication. Diffusely decreased bone density. No periprostatic acute fracture along the bilateral hip surgical hardware. No acute displaced fracture or diastasis of the bones of the pelvis. Old healed right inferior and superior pubic rami fractures. Poorly visualized slight periosteal reaction along the pubic symphysis (7:30) that may represent a subacute fracture. Other: None. IMPRESSION: 1. Negative for acute traumatic injury. 2. Poorly visualized slight periosteal reaction along the pubic symphysis that may represent a subacute fracture. Limited evaluation due to streak artifact. Finding would likely be better visualized on radiograph. Electronically Signed   By: Tish Frederickson M.D.   On: 04/03/2023 23:40   CT Lumbar Spine Wo Contrast  Result Date: 04/03/2023 CLINICAL DATA:  Low back pain, cauda equina syndrome suspected. Left groin and leg pain. EXAM:  CT LUMBAR SPINE WITHOUT CONTRAST TECHNIQUE: Multidetector CT imaging of the lumbar spine was performed without intravenous contrast administration. Multiplanar CT image reconstructions were also generated. RADIATION DOSE REDUCTION: This exam was performed according to the departmental dose-optimization program which includes automated exposure control, adjustment of the mA and/or kV according to patient size and/or use of iterative reconstruction technique. COMPARISON:  None Available. FINDINGS: Segmentation: 5 lumbar type vertebrae. Alignment: Severe convex leftward scoliosis.  No subluxation. Vertebrae: Diffuse severe osteopenia. No visible fracture or focal bone lesion. Paraspinal and other soft tissues: Negative Disc levels: Partial fusion across the disc spaces. Diffuse degenerative disc changes. Posterior spinal rods and fusion changes throughout the visualized lower thoracic and lumbar spine into the sacrum. IMPRESSION: Severe convex leftward scoliosis. Severe osteopenia. No acute bony abnormality. Electronically Signed   By: Charlett Nose M.D.   On: 04/03/2023 23:34    Assessment/Plan Muscle tear  Adjustment disorder with mixed anxiety and depressed mood - Plan: venlafaxine XR (EFFEXOR-XR) 75 MG 24 hr capsule  Deep vein thrombosis (DVT) of proximal vein of left lower extremity, unspecified chronicity (HCC) - Plan: rivaroxaban (XARELTO) 20 MG TABS tablet  Primary insomnia - Plan: traZODone (DESYREL) 100 MG tablet  Acute on chronic diastolic congestive heart failure (HCC), Chronic  Iron deficiency anemia due to chronic blood loss  DNR (do not resuscitate)  Scoliosis, unspecified scoliosis type, unspecified spinal region  AKI (acute kidney injury) [facility]) Patient admitted to SNF after a iliosoas muscle tear. No mechanism of injury, however, notable anemia. Hgb around 9 at the time of discharge. Will likely need repeat CBC in 1 week given anticoagulation high risk of continued bleeding. Sleep  improved with continued trazodone. No signs of shortness of breath at this time. Continue beta blocker. Continue PT for muscle tear. Cr returned to baseline on admission to facility. Patient is clear for discharge to return to primary care.    Family/ staff Communication: nursing  Labs/tests ordered:  Recommend repeat CBC with PCP in 1 week.

## 2023-04-20 MED ORDER — VENLAFAXINE HCL ER 75 MG PO CP24
75.0000 mg | ORAL_CAPSULE | Freq: Every day | ORAL | 0 refills | Status: DC
Start: 2023-04-20 — End: 2023-11-07

## 2023-04-20 MED ORDER — SPIRONOLACTONE 25 MG PO TABS: 25.0000 mg | ORAL_TABLET | Freq: Every day | ORAL | 0 refills | Status: AC

## 2023-04-20 MED ORDER — SOLIFENACIN SUCCINATE 10 MG PO TABS: 10.0000 mg | ORAL_TABLET | Freq: Every day | ORAL | 0 refills | Status: DC

## 2023-04-20 MED ORDER — PREGABALIN 75 MG PO CAPS: 75.0000 mg | ORAL_CAPSULE | Freq: Three times a day (TID) | ORAL | 0 refills | Status: DC

## 2023-04-20 MED ORDER — METOPROLOL SUCCINATE ER 25 MG PO TB24: 12.5000 mg | ORAL_TABLET | Freq: Every day | ORAL | 0 refills | Status: DC

## 2023-04-20 MED ORDER — MECLIZINE HCL 25 MG PO TABS: 25.0000 mg | ORAL_TABLET | Freq: Three times a day (TID) | ORAL | 0 refills | Status: DC | PRN

## 2023-04-20 MED ORDER — RIVAROXABAN 20 MG PO TABS
20.0000 mg | ORAL_TABLET | Freq: Every day | ORAL | 0 refills | Status: AC
Start: 2023-04-20 — End: ?

## 2023-04-20 MED ORDER — FAMOTIDINE 10 MG PO TABS: 10.0000 mg | ORAL_TABLET | Freq: Every day | ORAL | 0 refills | Status: DC

## 2023-04-20 MED ORDER — TRAZODONE HCL 100 MG PO TABS
200.0000 mg | ORAL_TABLET | Freq: Every day | ORAL | 0 refills | Status: AC
Start: 2023-04-20 — End: ?

## 2023-04-20 MED ORDER — METOPROLOL SUCCINATE ER 25 MG PO TB24: 25.0000 mg | ORAL_TABLET | Freq: Every day | ORAL | 0 refills | Status: AC

## 2023-04-20 MED ORDER — OMEPRAZOLE 20 MG PO CPDR: 20.0000 mg | DELAYED_RELEASE_CAPSULE | ORAL | 30 refills | Status: AC

## 2023-05-12 ENCOUNTER — Encounter (INDEPENDENT_AMBULATORY_CARE_PROVIDER_SITE_OTHER): Payer: Self-pay | Admitting: Vascular Surgery

## 2023-05-12 ENCOUNTER — Ambulatory Visit (INDEPENDENT_AMBULATORY_CARE_PROVIDER_SITE_OTHER): Payer: Medicare Other | Admitting: Vascular Surgery

## 2023-05-12 VITALS — BP 145/78 | HR 90 | Resp 16 | Wt 170.6 lb

## 2023-05-12 DIAGNOSIS — E782 Mixed hyperlipidemia: Secondary | ICD-10-CM

## 2023-05-12 DIAGNOSIS — I825Y2 Chronic embolism and thrombosis of unspecified deep veins of left proximal lower extremity: Secondary | ICD-10-CM

## 2023-05-12 DIAGNOSIS — D6851 Activated protein C resistance: Secondary | ICD-10-CM

## 2023-05-12 DIAGNOSIS — I10 Essential (primary) hypertension: Secondary | ICD-10-CM | POA: Diagnosis not present

## 2023-05-12 NOTE — Assessment & Plan Note (Signed)
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  Tolerating anticoagulation and will continue this indefinitely.  I will plan to see her back in 6 months.  Compression socks and elevation for leg swelling and pain.  No role for intervention at this point.

## 2023-05-12 NOTE — Progress Notes (Signed)
MRN : [id number]  Kristi Lee is a 67 y.o. (1956/02/01) female who presents with chief complaint of  Chief Complaint  Patient presents with   New Patient (Initial Visit)    Ref Jesse Sans consult hx lle dvt, no dvt with last u/s  .  History of Present Illness: Patient returns today in follow up on referral after recent admission for a pelvic pulled muscle that left her very debilitated and actually had to go to a nursing home for rehabilitation for several weeks.  She is home now.  Her leg swelling is gone down.  She has a previous history of DVT and factor V Leiden and is on long-term anticoagulation.  I performed a thrombectomy of the left lower extremity about 3 years ago for the extensive DVT.  She really has minimal swelling now.  Current Outpatient Medications  Medication Sig Dispense Refill   acetaminophen (TYLENOL) 325 MG tablet Take 650 mg by mouth every 8 (eight) hours as needed.     betamethasone dipropionate 0.05 % cream Apply 1 application topically daily as needed (psoriasis).     cetirizine (ZYRTEC) 5 MG tablet Take 5 mg by mouth daily as needed for allergies.     docusate sodium (COLACE) 100 MG capsule Take 1 capsule (100 mg total) by mouth daily.     famotidine (PEPCID) 10 MG tablet Take 1 tablet (10 mg total) by mouth daily. 30 tablet 0   hydrocortisone cream 1 % Apply to affected area 2 times daily 15 g 0   meclizine (ANTIVERT) 25 MG tablet Take 1 tablet (25 mg total) by mouth 3 (three) times daily as needed for dizziness. 90 tablet 0   metoprolol succinate (TOPROL-XL) 25 MG 24 hr tablet Take 1 tablet (25 mg total) by mouth daily. 30 tablet 0   metoprolol succinate (TOPROL-XL) 25 MG 24 hr tablet Take 0.5 tablets (12.5 mg total) by mouth daily. 15 tablet 0   omeprazole (PRILOSEC) 20 MG capsule Take 1 capsule (20 mg total) by mouth every other day. 15 capsule 30   oxycodone (OXY-IR) 5 MG capsule Take 2.5 mg by mouth every 6 (six) hours as needed. Give one tablet by  mouth every 6 hours as needed     polyethylene glycol (MIRALAX / GLYCOLAX) 17 g packet Take 17 g by mouth daily.     rivaroxaban (XARELTO) 20 MG TABS tablet Take 1 tablet (20 mg total) by mouth daily with supper. 30 tablet 0   solifenacin (VESICARE) 10 MG tablet Take 1 tablet (10 mg total) by mouth daily. 30 tablet 0   spironolactone (ALDACTONE) 25 MG tablet Take 1 tablet (25 mg total) by mouth daily. 30 tablet 0   traZODone (DESYREL) 100 MG tablet Take 2 tablets (200 mg total) by mouth at bedtime. 60 tablet 0   venlafaxine XR (EFFEXOR-XR) 75 MG 24 hr capsule Take 1 capsule (75 mg total) by mouth daily. 30 capsule 0   Zinc Oxide (TRIPLE PASTE) 12.8 % ointment Apply 1 Application topically. Every shift.     pregabalin (LYRICA) 75 MG capsule Take 1 capsule (75 mg total) by mouth 3 (three) times daily for 7 days. 21 capsule 0   No current facility-administered medications for this visit.    Past Medical History:  Diagnosis Date   Arthritis    back   CHF (congestive heart failure) (HCC)    Depression    DVT (deep venous thrombosis) (HCC) 01/25/2020   GERD (gastroesophageal reflux disease)  Heart murmur    asymptomatic   Hypertension    PONV (postoperative nausea and vomiting)    Scoliosis    reports she has rods in her back    Past Surgical History:  Procedure Laterality Date   ABDOMINAL HYSTERECTOMY     BACK SURGERY  10/2000   for scoliosis   BREAST EXCISIONAL BIOPSY Right 1990's   NEG   BROW LIFT Bilateral 07/23/2020   Procedure: BLEPHAROPLASTY UPPER EYELID; W/EXCESS SKIN BLEPHAROPTOSIS REPAIR; RESECT EX BILATERAL;  Surgeon: Imagene Riches, MD;  Location: [facility];  Service: Ophthalmology;  Laterality: Bilateral;   COLONOSCOPY WITH PROPOFOL N/A 03/23/2020   Procedure: COLONOSCOPY WITH PROPOFOL;  Surgeon: Midge Minium, MD;  Location: [facility];  Service: Endoscopy;  Laterality: N/A;  priority 4   FEMUR FRACTURE SURGERY Right 07/15/2009   Pertrochanteric  femur fracture.   HIP FRACTURE SURGERY Right    PERIPHERAL VASCULAR THROMBECTOMY Left 11/11/2019   Procedure: PERIPHERAL VASCULAR THROMBECTOMY;  Surgeon: Annice Needy, MD;  Location: [facility];  Service: Cardiovascular;  Laterality: Left;   POLYPECTOMY  03/23/2020   Procedure: POLYPECTOMY;  Surgeon: Midge Minium, MD;  Location: [facility];  Service: Endoscopy;;   REVERSE SHOULDER ARTHROPLASTY Left 07/30/2021   Procedure: Left reverse shoulder arthroplasty, biceps tenodesis;  Surgeon: Signa Kell, MD;  Location: [facility];  Service: Orthopedics;  Laterality: Left;   REVISION TOTAL HIP ARTHROPLASTY Left      Social History   Tobacco Use   Smoking status: Never   Smokeless tobacco: Never  Vaping Use   Vaping status: Never Used  Substance Use Topics   Alcohol use: No   Drug use: No      Family History  Problem Relation Age of Onset   Osteoarthritis Mother    Lung cancer Father    Diabetes Brother    Breast cancer Neg Hx      No Known Allergies  REVIEW OF SYSTEMS (Negative unless checked)   Constitutional: [] Weight loss  [] Fever  [] Chills Cardiac: [] Chest pain   [] Chest pressure   [] Palpitations   [] Shortness of breath when laying flat   [] Shortness of breath at rest   [] Shortness of breath with exertion. Vascular:  [] Pain in legs with walking   [] Pain in legs at rest   [] Pain in legs when laying flat   [] Claudication   [] Pain in feet when walking  [] Pain in feet at rest  [] Pain in feet when laying flat   [x] History of DVT   [] Phlebitis   [x] Swelling in legs   [] Varicose veins   [] Non-healing ulcers Pulmonary:   [] Uses home oxygen   [] Productive cough   [] Hemoptysis   [] Wheeze  [] COPD   [] Asthma Neurologic:  [] Dizziness  [] Blackouts   [] Seizures   [] History of stroke   [] History of TIA  [] Aphasia   [] Temporary blindness   [] Dysphagia   [] Weakness or numbness in arms   [] Weakness or numbness in legs Musculoskeletal:  [x] Arthritis   [] Joint swelling   [] Joint  pain   [x] Low back pain Hematologic:  [] Easy bruising  [] Easy bleeding   [] Hypercoagulable state   [] Anemic   Gastrointestinal:  [] Blood in stool   [] Vomiting blood  [x] Gastroesophageal reflux/heartburn   [] Abdominal pain Genitourinary:  [] Chronic kidney disease   [] Difficult urination  [] Frequent urination  [] Burning with urination   [] Hematuria Skin:  [] Rashes   [] Ulcers   [] Wounds Psychological:  [] History of anxiety   []  History of major depression.  Physical  Examination  BP (!) 145/78 (BP Location: Right Arm)   Pulse 90   Resp 16   Wt 170 lb 9.6 oz (77.4 kg)   BMI 27.54 kg/m  Gen:  WD/WN, NAD Head: [place]/AT, No temporalis wasting. Ear/Nose/Throat: Hearing grossly intact, nares w/o erythema or drainage Eyes: Conjunctiva clear. Sclera non-icteric Neck: Supple.  Trachea midline Pulmonary:  Good air movement, no use of accessory muscles.  Cardiac: RRR, no JVD Vascular:  Vessel Right Left  Radial Palpable Palpable                          PT Palpable Palpable  DP Palpable Palpable    Musculoskeletal:  No deformity or atrophy.  Mild stasis dermatitis changes and minimal left lower extremity edema present today.  Walking with a cane and her walking is still somewhat limited. Neurologic: Sensation grossly intact in extremities.  Symmetrical.  Speech is fluent.  Psychiatric: Judgment intact, Mood & affect appropriate for pt's clinical situation. Dermatologic: No rashes or ulcers noted.  No cellulitis or open wounds.      Labs Recent Results (from the past 2160 hour(s))  CBC with Differential     Status: Abnormal   Collection Time: 04/03/23  7:37 PM  Result Value Ref Range   WBC 17.1 (H) 4.0 - 10.5 K/uL   RBC 4.79 3.87 - 5.11 MIL/uL   Hemoglobin 13.5 12.0 - 15.0 g/dL   HCT 40.9 81.1 - 91.4 %   MCV 87.5 80.0 - 100.0 fL   MCH 28.2 26.0 - 34.0 pg   MCHC 32.2 30.0 - 36.0 g/dL   RDW 78.2 (H) 95.6 - 21.3 %   Platelets 178 150 - 400 K/uL   nRBC 0.0 0.0 - 0.2 %    Neutrophils Relative % 82 %   Neutro Abs 14.2 (H) 1.7 - 7.7 K/uL   Lymphocytes Relative 5 %   Lymphs Abs 0.8 0.7 - 4.0 K/uL   Monocytes Relative 10 %   Monocytes Absolute 1.8 (H) 0.1 - 1.0 K/uL   Eosinophils Relative 0 %   Eosinophils Absolute 0.0 0.0 - 0.5 K/uL   Basophils Relative 1 %   Basophils Absolute 0.1 0.0 - 0.1 K/uL   Immature Granulocytes 2 %   Abs Immature Granulocytes 0.27 (H) 0.00 - 0.07 K/uL    Comment: Performed at [facility], [address] [place], [place] [address]  Comprehensive metabolic panel     Status: Abnormal   Collection Time: 04/03/23  7:37 PM  Result Value Ref Range   Sodium 132 (L) 135 - 145 mmol/L   Potassium 3.1 (L) 3.5 - 5.1 mmol/L   Chloride 96 (L) 98 - 111 mmol/L   CO2 24 22 - 32 mmol/L   Glucose, Bld 141 (H) 70 - 99 mg/dL    Comment: Glucose reference range applies only to samples taken after fasting for at least 8 hours.   BUN 23 8 - 23 mg/dL   Creatinine, Ser 8.46 (H) 0.44 - 1.00 mg/dL   Calcium 8.9 8.9 - 96.2 mg/dL   Total Protein 7.3 6.5 - 8.1 g/dL   Albumin 4.0 3.5 - 5.0 g/dL   AST 29 15 - 41 U/L   ALT 26 0 - 44 U/L   Alkaline Phosphatase 143 (H) 38 - 126 U/L   Total Bilirubin 1.0 0.3 - 1.2 mg/dL   GFR, Estimated 48 (L) >60 mL/min    Comment: (NOTE) Calculated using the CKD-EPI Creatinine Equation (  2021)    Anion gap 12 5 - 15    Comment: Performed at [facility], [address] [place], [place] [address]  Hemoglobin A1c     Status: Abnormal   Collection Time: 04/03/23  7:37 PM  Result Value Ref Range   Hgb A1c MFr Bld 6.3 (H) 4.8 - 5.6 %    Comment: (NOTE) Pre diabetes:          5.7%-6.4%  Diabetes:              >6.4%  Glycemic control for   <7.0% adults with diabetes    Mean Plasma Glucose 134.11 mg/dL    Comment: Performed at [facility], [address], [place], [place] [address]  Urinalysis, Routine w reflex microscopic -Urine, Clean Catch     Status: Abnormal   Collection Time: 04/03/23   9:34 PM  Result Value Ref Range   Color, Urine YELLOW (A) YELLOW   APPearance CLOUDY (A) CLEAR   Specific Gravity, Urine 1.016 1.005 - 1.030   pH 5.0 5.0 - 8.0   Glucose, UA NEGATIVE NEGATIVE mg/dL   Hgb urine dipstick NEGATIVE NEGATIVE   Bilirubin Urine NEGATIVE NEGATIVE   Ketones, ur NEGATIVE NEGATIVE mg/dL   Protein, ur 30 (A) NEGATIVE mg/dL   Nitrite NEGATIVE NEGATIVE   Leukocytes,Ua LARGE (A) NEGATIVE   RBC / HPF 0-5 0 - 5 RBC/hpf   WBC, UA >50 0 - 5 WBC/hpf   Bacteria, UA MANY (A) NONE SEEN   Squamous Epithelial / HPF 0-5 0 - 5 /HPF   WBC Clumps PRESENT    Mucus PRESENT     Comment: Performed at [facility], [address] [place], [place] [address]  HIV Antibody (routine testing w rflx)     Status: None   Collection Time: 04/04/23  7:17 AM  Result Value Ref Range   HIV Screen 4th Generation wRfx Non Reactive Non Reactive    Comment: Performed at [facility], [address] [place], [place] [address]  Comprehensive metabolic panel     Status: Abnormal   Collection Time: 04/04/23  7:17 AM  Result Value Ref Range   Sodium 133 (L) 135 - 145 mmol/L   Potassium 3.0 (L) 3.5 - 5.1 mmol/L   Chloride 101 98 - 111 mmol/L   CO2 24 22 - 32 mmol/L   Glucose, Bld 141 (H) 70 - 99 mg/dL    Comment: Glucose reference range applies only to samples taken after fasting for at least 8 hours.   BUN 22 8 - 23 mg/dL   Creatinine, Ser 2.95 (H) 0.44 - 1.00 mg/dL   Calcium 8.1 (L) 8.9 - 10.3 mg/dL   Total Protein 6.6 6.5 - 8.1 g/dL   Albumin 3.3 (L) 3.5 - 5.0 g/dL   AST 25 15 - 41 U/L   ALT 24 0 - 44 U/L   Alkaline Phosphatase 121 38 - 126 U/L   Total Bilirubin 0.9 0.3 - 1.2 mg/dL   GFR, Estimated 59 (L) >60 mL/min    Comment: (NOTE) Calculated using the CKD-EPI Creatinine Equation (2021)    Anion gap 8 5 - 15    Comment: Performed at [facility], [address] [place], [place] [address]  CBC     Status: Abnormal   Collection Time: 04/04/23  7:17 AM   Result Value Ref Range   WBC 12.8 (H) 4.0 - 10.5 K/uL   RBC 4.25 3.87 - 5.11 MIL/uL  Hemoglobin 12.2 12.0 - 15.0 g/dL   HCT 40.9 81.1 - 91.4 %   MCV 87.1 80.0 - 100.0 fL   MCH 28.7 26.0 - 34.0 pg   MCHC 33.0 30.0 - 36.0 g/dL   RDW 78.2 (H) 95.6 - 21.3 %   Platelets 131 (L) 150 - 400 K/uL   nRBC 0.0 0.0 - 0.2 %    Comment: Performed at [facility], [address] [place], [place] [address]  Lactic acid, plasma     Status: None   Collection Time: 04/04/23  7:17 AM  Result Value Ref Range   Lactic Acid, Venous 1.4 0.5 - 1.9 mmol/L    Comment: Performed at [facility], [address] [place], [place] [address]  CBC with Differential/Platelet     Status: Abnormal   Collection Time: 04/05/23  4:09 AM  Result Value Ref Range   WBC 12.1 (H) 4.0 - 10.5 K/uL   RBC 4.26 3.87 - 5.11 MIL/uL   Hemoglobin 12.3 12.0 - 15.0 g/dL   HCT 29.5 28.4 - 13.2 %   MCV 85.7 80.0 - 100.0 fL   MCH 28.9 26.0 - 34.0 pg   MCHC 33.7 30.0 - 36.0 g/dL   RDW 44.0 (H) 10.2 - 72.5 %   Platelets 129 (L) 150 - 400 K/uL   nRBC 0.0 0.0 - 0.2 %   Neutrophils Relative % 90 %   Neutro Abs 10.8 (H) 1.7 - 7.7 K/uL   Lymphocytes Relative 2 %   Lymphs Abs 0.3 (L) 0.7 - 4.0 K/uL   Monocytes Relative 5 %   Monocytes Absolute 0.6 0.1 - 1.0 K/uL   Eosinophils Relative 2 %   Eosinophils Absolute 0.3 0.0 - 0.5 K/uL   Basophils Relative 0 %   Basophils Absolute 0.0 0.0 - 0.1 K/uL   Immature Granulocytes 1 %   Abs Immature Granulocytes 0.17 (H) 0.00 - 0.07 K/uL    Comment: Performed at [facility], [address], [place], [place] [address]  Basic metabolic panel     Status: Abnormal   Collection Time: 04/05/23  4:09 AM  Result Value Ref Range   Sodium 135 135 - 145 mmol/L   Potassium 4.9 3.5 - 5.1 mmol/L   Chloride 106 98 - 111 mmol/L   CO2 25 22 - 32 mmol/L   Glucose, Bld 169 (H) 70 - 99 mg/dL    Comment: Glucose reference range applies only to samples taken after fasting for at  least 8 hours.   BUN 25 (H) 8 - 23 mg/dL   Creatinine, Ser 0.34 (H) 0.44 - 1.00 mg/dL   Calcium 8.1 (L) 8.9 - 10.3 mg/dL   GFR, Estimated 50 (L) >60 mL/min    Comment: (NOTE) Calculated using the CKD-EPI Creatinine Equation (2021)    Anion gap 4 (L) 5 - 15    Comment: Performed at [facility], [address] [place], [place] [address]  Basic metabolic panel     Status: Abnormal   Collection Time: 04/07/23  7:18 AM  Result Value Ref Range   Sodium 139 135 - 145 mmol/L   Potassium 3.7 3.5 - 5.1 mmol/L   Chloride 104 98 - 111 mmol/L   CO2 23 22 - 32 mmol/L   Glucose, Bld 118 (H) 70 - 99 mg/dL    Comment: Glucose reference range applies only to samples taken after fasting for at least 8 hours.   BUN 15 8 - 23 mg/dL   Creatinine,  Ser 0.75 0.44 - 1.00 mg/dL   Calcium 8.2 (L) 8.9 - 10.3 mg/dL   GFR, Estimated >16 >10 mL/min    Comment: (NOTE) Calculated using the CKD-EPI Creatinine Equation (2021)    Anion gap 12 5 - 15    Comment: Performed at [facility], [address] [place], [place] [address]  CBC     Status: Abnormal   Collection Time: 04/07/23  7:18 AM  Result Value Ref Range   WBC 8.4 4.0 - 10.5 K/uL   RBC 4.04 3.87 - 5.11 MIL/uL   Hemoglobin 11.3 (L) 12.0 - 15.0 g/dL   HCT 40.9 (L) 81.1 - 91.4 %   MCV 85.9 80.0 - 100.0 fL   MCH 28.0 26.0 - 34.0 pg   MCHC 32.6 30.0 - 36.0 g/dL   RDW 78.2 (H) 95.6 - 21.3 %   Platelets 165 150 - 400 K/uL   nRBC 0.0 0.0 - 0.2 %    Comment: Performed at [facility], [address], [place], [place] [address]  SARS Coronavirus 2 by RT PCR (hospital order, performed in [facility] hospital lab) *cepheid single result test* Anterior Nasal Swab     Status: None   Collection Time: 04/07/23 11:59 AM   Specimen: Anterior Nasal Swab  Result Value Ref Range   SARS Coronavirus 2 by RT PCR NEGATIVE NEGATIVE    Comment: (NOTE) SARS-CoV-2 target nucleic acids are NOT DETECTED.  The SARS-CoV-2 RNA is generally  detectable in upper and lower respiratory specimens during the acute phase of infection. The lowest concentration of SARS-CoV-2 viral copies this assay can detect is 250 copies / mL. A negative result does not preclude SARS-CoV-2 infection and should not be used as the sole basis for treatment or other patient management decisions.  A negative result may occur with improper specimen collection / handling, submission of specimen other than nasopharyngeal swab, presence of viral mutation(s) within the areas targeted by this assay, and inadequate number of viral copies (<250 copies / mL). A negative result must be combined with clinical observations, patient history, and epidemiological information.  Fact Sheet for Patients:   RoadLapTop.co.za  Fact Sheet for Healthcare Providers: http://kim-miller.com/  This test is not yet approved or  cleared by the [place] FDA and has been authorized for detection and/or diagnosis of SARS-CoV-2 by FDA under an Emergency Use Authorization (EUA).  This EUA will remain in effect (meaning this test can be used) for the duration of the COVID-19 declaration under Section 564(b)(1) of the Act, 21 U.S.C. section 360bbb-3(b)(1), unless the authorization is terminated or revoked sooner.  Performed at [facility], [address] [place], [place] [address]   CBC and differential     Status: Abnormal   Collection Time: 04/13/23 12:00 AM  Result Value Ref Range   Hemoglobin 9.9 (A) 12.0 - 16.0   HCT 31 (A) 36 - 46   Neutrophils Absolute 7,946.00    Platelets 356 150 - 400 K/uL   WBC 10.4   CBC     Status: Abnormal   Collection Time: 04/13/23 12:00 AM  Result Value Ref Range   RBC 3.53 (A) 3.87 - 5.11  Basic metabolic panel     Status: Abnormal   Collection Time: 04/13/23 12:00 AM  Result Value Ref Range   Glucose 83    BUN 16 4 - 21   CO2 28 (A) 13 - 22   Creatinine 0.8 0.5 - 1.1    Potassium 4.4  3.5 - 5.1 mEq/L   Sodium 144 137 - 147   Chloride 108 99 - 108  Comprehensive metabolic panel     Status: Abnormal   Collection Time: 04/13/23 12:00 AM  Result Value Ref Range   eGFR 85    Calcium 8.3 (A) 8.7 - 10.7    Radiology No results found.  Assessment/Plan Essential hypertension blood pressure control important in reducing the progression of atherosclerotic disease. On appropriate oral medications.     Hyperlipidemia, mixed lipid control important in reducing the progression of atherosclerotic disease. Continue statin therapy  DVT (deep venous thrombosis) (HCC) Postphlebitic symptoms are currently very mild.  Tolerating anticoagulation and will continue this indefinitely.  I will plan to see her back in 6 months.  Compression socks and elevation for leg swelling and pain.  No role for intervention at this point.  Factor V Leiden [facility]) Plan is for long-term/lifelong anticoagulation    Festus Barren, MD  05/12/2023 12:30 PM    This note was created with Dragon medical transcription system.  Any errors from dictation are purely unintentional

## 2023-05-12 NOTE — Assessment & Plan Note (Signed)
Plan is for long-term/lifelong anticoagulation

## 2023-06-16 ENCOUNTER — Encounter: Payer: Self-pay | Admitting: Cardiology

## 2023-06-16 ENCOUNTER — Ambulatory Visit: Payer: Medicare Other | Attending: Cardiology | Admitting: Cardiology

## 2023-06-16 VITALS — BP 130/78 | HR 79 | Ht 66.0 in | Wt 175.0 lb

## 2023-06-16 DIAGNOSIS — I10 Essential (primary) hypertension: Secondary | ICD-10-CM | POA: Diagnosis not present

## 2023-06-16 DIAGNOSIS — R0609 Other forms of dyspnea: Secondary | ICD-10-CM | POA: Diagnosis not present

## 2023-06-16 NOTE — Progress Notes (Signed)
Cardiology Office Note:    Date:  06/16/2023   ID:  Kristi Lee, DOB March 13, 1956, MRN [id number]  PCP:  Shane Crutch, PA  [facility] Cardiologist:  Debbe Odea, MD  [facility] Electrophysiologist:  None   Referring MD: [facility]*   Chief Complaint  Patient presents with   Follow-up    Follow up medication reviewed with patient verbally    History of Present Illness:    Kristi Lee is a 67 y.o. female with a hx of hypertension, factor V [place], left lower extremity DVT s/p thrombolysis with tPA on Xarelto who presents for follow-up.   Patient states having shortness of breath ongoing for at least 3 years.  Shortness of breath usually occurs with exertion.  Previously seen by myself 3 years ago where workup with echo and coronary CTA were unrevealing.    Pulmonary function testing 07/2020 indicated patient has moderate restrictive lung without significant bronchodilator response.  Otherwise doing okay.  No other concerns from a cardiac perspective at this time.  Follows up with vascular surgery for lower extremity DVT management.  On chronic Xarelto.   Prior notes Echo 02/2020 showed normal systolic and diastolic function, EF 55-60 %. Coronary CTA 03/2020, calcium score of 0, no evidence of CAD.  Patient was diagnosed with extensive left lower extremity DVT involving most of the left common femoral vein as well as the popliteal and calf veins in March 2021.  She underwent catheter directed thrombolysis with TPA by vascular surgery and started on Xarelto.    Past Medical History:  Diagnosis Date   Arthritis    back   CHF (congestive heart failure) (HCC)    Depression    DVT (deep venous thrombosis) (HCC) 01/25/2020   GERD (gastroesophageal reflux disease)    Heart murmur    asymptomatic   Hypertension    PONV (postoperative nausea and vomiting)    Scoliosis    reports she has rods in her back    Past Surgical History:  Procedure  Laterality Date   ABDOMINAL HYSTERECTOMY     BACK SURGERY  10/2000   for scoliosis   BREAST EXCISIONAL BIOPSY Right 1990's   NEG   BROW LIFT Bilateral 07/23/2020   Procedure: BLEPHAROPLASTY UPPER EYELID; W/EXCESS SKIN BLEPHAROPTOSIS REPAIR; RESECT EX BILATERAL;  Surgeon: Imagene Riches, MD;  Location: [facility];  Service: Ophthalmology;  Laterality: Bilateral;   COLONOSCOPY WITH PROPOFOL N/A 03/23/2020   Procedure: COLONOSCOPY WITH PROPOFOL;  Surgeon: Midge Minium, MD;  Location: [facility];  Service: Endoscopy;  Laterality: N/A;  priority 4   FEMUR FRACTURE SURGERY Right 07/15/2009   Pertrochanteric femur fracture.   HIP FRACTURE SURGERY Right    PERIPHERAL VASCULAR THROMBECTOMY Left 11/11/2019   Procedure: PERIPHERAL VASCULAR THROMBECTOMY;  Surgeon: Annice Needy, MD;  Location: [facility] INVASIVE CV LAB;  Service: Cardiovascular;  Laterality: Left;   POLYPECTOMY  03/23/2020   Procedure: POLYPECTOMY;  Surgeon: Midge Minium, MD;  Location: [facility];  Service: Endoscopy;;   REVERSE SHOULDER ARTHROPLASTY Left 07/30/2021   Procedure: Left reverse shoulder arthroplasty, biceps tenodesis;  Surgeon: Signa Kell, MD;  Location: [facility] ORS;  Service: Orthopedics;  Laterality: Left;   REVISION TOTAL HIP ARTHROPLASTY Left     Current Medications: Current Meds  Medication Sig   docusate sodium (COLACE) 100 MG capsule Take 1 capsule (100 mg total) by mouth daily.   hydrocortisone cream 1 % Apply to affected area 2 times daily   KLOR-CON M20  20 MEQ tablet Take 20 mEq by mouth daily.   metoprolol succinate (TOPROL-XL) 25 MG 24 hr tablet Take 1 tablet (25 mg total) by mouth daily.   omeprazole (PRILOSEC) 20 MG capsule Take 1 capsule (20 mg total) by mouth every other day.   oxybutynin (DITROPAN-XL) 10 MG 24 hr tablet Take 10 mg by mouth daily.   oxycodone (OXY-IR) 5 MG capsule Take 2.5 mg by mouth every 6 (six) hours as needed. Give one tablet by mouth every 6 hours as needed    polyethylene glycol (MIRALAX / GLYCOLAX) 17 g packet Take 17 g by mouth daily.   rivaroxaban (XARELTO) 20 MG TABS tablet Take 1 tablet (20 mg total) by mouth daily with supper.   solifenacin (VESICARE) 10 MG tablet Take 1 tablet (10 mg total) by mouth daily.   spironolactone (ALDACTONE) 25 MG tablet Take 1 tablet (25 mg total) by mouth daily.   traZODone (DESYREL) 100 MG tablet Take 2 tablets (200 mg total) by mouth at bedtime.   triamterene-hydrochlorothiazide (MAXZIDE-25) 37.5-25 MG tablet Take 1 tablet by mouth daily.   venlafaxine XR (EFFEXOR-XR) 75 MG 24 hr capsule Take 1 capsule (75 mg total) by mouth daily.   [DISCONTINUED] acetaminophen (TYLENOL) 325 MG tablet Take 650 mg by mouth every 8 (eight) hours as needed.   [DISCONTINUED] betamethasone dipropionate 0.05 % cream Apply 1 application topically daily as needed (psoriasis).   [DISCONTINUED] cetirizine (ZYRTEC) 5 MG tablet Take 5 mg by mouth daily as needed for allergies.   [DISCONTINUED] famotidine (PEPCID) 10 MG tablet Take 1 tablet (10 mg total) by mouth daily.   [DISCONTINUED] meclizine (ANTIVERT) 25 MG tablet Take 1 tablet (25 mg total) by mouth 3 (three) times daily as needed for dizziness.   [DISCONTINUED] metoprolol succinate (TOPROL-XL) 25 MG 24 hr tablet Take 0.5 tablets (12.5 mg total) by mouth daily.   [DISCONTINUED] Zinc Oxide (TRIPLE PASTE) 12.8 % ointment Apply 1 Application topically. Every shift.     Allergies:   Patient has no known allergies.   Social History   Socioeconomic History   Marital status: Married    Spouse name: Not on file   Number of children: 1   Years of education: HS   Highest education level: Not on file  Occupational History   Occupation: Arts development officer: [employer]  Tobacco Use   Smoking status: Never   Smokeless tobacco: Never  Vaping Use   Vaping status: Never Used  Substance and Sexual Activity   Alcohol use: No   Drug use: No   Sexual activity: Not on file   Other Topics Concern   Not on file  Social History Narrative   Lives at home in private residence with husband   Social Determinants of Health   Financial Resource Strain: Low Risk  (04/19/2022)   Received from [facility]   Overall Financial Resource Strain (CARDIA)    Difficulty of Paying Living Expenses: Not hard at all  Food Insecurity: No Food Insecurity (04/04/2023)   Hunger Vital Sign    Worried About Running Out of Food in the Last Year: Never true    Ran Out of Food in the Last Year: Never true  Transportation Needs: No Transportation Needs (04/04/2023)   PRAPARE - Administrator, Civil Service (Medical): No    Lack of Transportation (Non-Medical): No  Physical Activity: Not on file  Stress: Not on file  Social Connections: Not on file  Family History: The patient's family history includes Diabetes in her brother; Lung cancer in her father; Osteoarthritis in her mother. There is no history of Breast cancer.  ROS:   Please see the history of present illness.     All other systems reviewed and are negative.  EKGs/Labs/Other Studies Reviewed:    The following studies were reviewed today:   EKG Interpretation Date/Time:  Friday June 16 2023 09:09:25 EDT Ventricular Rate:  79 PR Interval:  144 QRS Duration:  88 QT Interval:  392 QTC Calculation: 449 R Axis:   -55  Text Interpretation: Normal sinus rhythm Left anterior fascicular block Confirmed by Debbe Odea ([id number]) on 06/16/2023 9:28:46 AM    Recent Labs: 04/04/2023: ALT 24 04/13/2023: BUN 16; Creatinine 0.8; Hemoglobin 9.9; Platelets 356; Potassium 4.4; Sodium 144  Recent Lipid Panel    Component Value Date/Time   CHOL 189 04/26/2019 0910   TRIG 111 04/26/2019 0910   HDL 67 04/26/2019 0910   CHOLHDL 2.8 04/26/2019 0910   LDLCALC 102 (H) 04/26/2019 0910    Physical Exam:    VS:  BP 130/78 (BP Location: Left Arm, Patient Position: Sitting, Cuff Size: Normal)    Pulse 79   Ht 5\' 6"  (1.676 m)   Wt 175 lb (79.4 kg)   SpO2 98%   BMI 28.25 kg/m     Wt Readings from Last 3 Encounters:  06/16/23 175 lb (79.4 kg)  05/12/23 170 lb 9.6 oz (77.4 kg)  04/19/23 175 lb 9.6 oz (79.7 kg)     GEN:  Well nourished, well developed in no acute distress HEENT: Normal NECK: No JVD; No carotid bruits CARDIAC: RRR, no murmurs, rubs, gallops RESPIRATORY:  Clear to auscultation without rales, wheezing or rhonchi  ABDOMEN: Soft, non-tender, non-distended MUSCULOSKELETAL:  Trace to 1+ edema; No deformity  SKIN: Warm and dry, erythematous rash. NEUROLOGIC:  Alert and oriented x 3 PSYCHIATRIC:  Normal affect   ASSESSMENT:    1. Dyspnea on exertion   2. Essential hypertension    PLAN:    In order of problems listed above:  Dyspnea on exertion, chronic issue, ongoing over 3 years now.  Possibly from deconditioning.  Previous coronary CTA was normal with no CAD.  Echo 2021 showed normal systolic and diastolic function, EF 55 to 60%.  Repeat echocardiogram to evaluate any new structural abnormalities.  Refer patient to pulmonary medicine for any additional input.  Prior PFTs in 2021 mention moderate restrictive lung disease. Hypertension, BP controlled.  Continue Maxzide, Aldactone, Toprol-XL.  Follow-up after echo.   This note was generated in part or whole with voice recognition software. Voice recognition is usually quite accurate but there are transcription errors that can and very often do occur. I apologize for any typographical errors that were not detected and corrected.  Medication Adjustments/Labs and Tests Ordered: Current medicines are reviewed at length with the patient today.  Concerns regarding medicines are outlined above.  Orders Placed This Encounter  Procedures   Ambulatory referral to Pulmonology   EKG 12-Lead   ECHOCARDIOGRAM COMPLETE    No orders of the defined types were placed in this encounter.    Patient Instructions   Medication Instructions:  Your physician recommends that you continue on your current medications as directed. Please refer to the Current Medication list given to you today.  *If you need a refill on your cardiac medications before your next appointment, please call your pharmacy*  Lab Work: - None ordered  Testing/Procedures: Your physician has  requested that you have an echocardiogram. Echocardiography is a painless test that uses sound waves to create images of your heart. It provides your doctor with information about the size and shape of your heart and how well your heart's chambers and valves are working. This procedure takes approximately one hour. There are no restrictions for this procedure. Please do NOT wear cologne, perfume, aftershave, or lotions (deodorant is allowed). Please arrive 15 minutes prior to your appointment time.  Please note: We ask at that you not bring children with you during ultrasound (echo/ vascular) testing. Due to room size and safety concerns, children are not allowed in the ultrasound rooms during exams. Our front office staff cannot provide observation of children in our lobby area while testing is being conducted. An adult accompanying a patient to their appointment will only be allowed in the ultrasound room at the discretion of the ultrasound technician under special circumstances. We apologize for any inconvenience.   Follow-Up: At [facility], you and your health needs are our priority.  As part of our continuing mission to provide you with exceptional heart care, we have created designated Provider Care Teams.  These Care Teams include your primary Cardiologist (physician) and Advanced Practice Providers (APPs -  Physician Assistants and Nurse Practitioners) who all work together to provide you with the care you need, when you need it.  Your next appointment:   Follow-up after echocardiogram   Provider:   You may see Debbe Odea, MD  or one of the following Advanced Practice Providers on your designated Care Team:   Nicolasa Ducking, NP Eula Listen, PA-C Cadence Fransico Michael, PA-C Charlsie Quest, NP Carlos Levering, NP    Other Instructions Ambulatory referral to Pulmonology    Signed, Debbe Odea, MD  06/16/2023 10:34 AM    [facility]

## 2023-06-16 NOTE — Patient Instructions (Signed)
Medication Instructions:  Your physician recommends that you continue on your current medications as directed. Please refer to the Current Medication list given to you today.  *If you need a refill on your cardiac medications before your next appointment, please call your pharmacy*  Lab Work: - None ordered  Testing/Procedures: Your physician has requested that you have an echocardiogram. Echocardiography is a painless test that uses sound waves to create images of your heart. It provides your doctor with information about the size and shape of your heart and how well your heart's chambers and valves are working. This procedure takes approximately one hour. There are no restrictions for this procedure. Please do NOT wear cologne, perfume, aftershave, or lotions (deodorant is allowed). Please arrive 15 minutes prior to your appointment time.  Please note: We ask at that you not bring children with you during ultrasound (echo/ vascular) testing. Due to room size and safety concerns, children are not allowed in the ultrasound rooms during exams. Our front office staff cannot provide observation of children in our lobby area while testing is being conducted. An adult accompanying a patient to their appointment will only be allowed in the ultrasound room at the discretion of the ultrasound technician under special circumstances. We apologize for any inconvenience.   Follow-Up: At [facility] you and your health needs are our priority.  As part of our continuing mission to provide you with exceptional heart care, we have created designated Provider Care Teams.  These Care Teams include your primary Cardiologist (physician) and Advanced Practice Providers (APPs -  Physician Assistants and Nurse Practitioners) who all work together to provide you with the care you need, when you need it.  Your next appointment:   Follow-up after echocardiogram   Provider:   You may see Debbe Odea, MD  or one of the following Advanced Practice Providers on your designated Care Team:   Nicolasa Ducking, NP Eula Listen, PA-C Cadence Fransico Michael, PA-C Charlsie Quest, NP Carlos Levering, NP    Other Instructions Ambulatory referral to Pulmonology

## 2023-06-28 ENCOUNTER — Encounter: Payer: Self-pay | Admitting: Primary Care

## 2023-07-06 ENCOUNTER — Ambulatory Visit: Payer: Medicare Other | Attending: Cardiology

## 2023-07-06 DIAGNOSIS — R0609 Other forms of dyspnea: Secondary | ICD-10-CM | POA: Diagnosis present

## 2023-07-06 LAB — ECHOCARDIOGRAM COMPLETE
Area-P 1/2: 4.89 cm2
S' Lateral: 3.2 cm

## 2023-07-07 ENCOUNTER — Telehealth: Payer: Self-pay | Admitting: Cardiology

## 2023-07-07 NOTE — Telephone Encounter (Signed)
Patient stated she received notice from the pulmonologist to schedule a sleep clinic appointment.  Patient wants a call back to confirm the reason she was referred to the pulmonologist.

## 2023-07-07 NOTE — Telephone Encounter (Signed)
Called and spoke with patient. Patient informed that she had a referral placed for pulmonology for dyspnea on excretion. Patient verbalizes understanding.

## 2023-07-10 ENCOUNTER — Ambulatory Visit (INDEPENDENT_AMBULATORY_CARE_PROVIDER_SITE_OTHER): Payer: Medicare Other | Admitting: Urology

## 2023-07-10 ENCOUNTER — Encounter: Payer: Self-pay | Admitting: Urology

## 2023-07-10 VITALS — BP 166/83 | HR 114 | Ht 66.0 in | Wt 175.0 lb

## 2023-07-10 DIAGNOSIS — R32 Unspecified urinary incontinence: Secondary | ICD-10-CM

## 2023-07-10 DIAGNOSIS — N3946 Mixed incontinence: Secondary | ICD-10-CM | POA: Diagnosis not present

## 2023-07-10 MED ORDER — GEMTESA 75 MG PO TABS: 75.0000 mg | ORAL_TABLET | Freq: Every day | ORAL | Status: DC

## 2023-07-10 NOTE — Progress Notes (Signed)
07/10/2023 10:02 AM   Cristy Folks 29-Nov-1955 [id number]  Referring provider: Shane Crutch, PA [address] [place],  [place] [address]  Chief Complaint  Patient presents with   Follow-up    HPI: I was consulted to assess the patient is urinary incontinence worsening in the last 3 months.  She primarily has urge incontinence with some early morning foot on the floor syndrome.  I think she can leak when she goes from sitting to standing position.  She does leak with coughing sneezing as well.  She is moderately better on oxybutynin ER 10 mg.  No bedwetting  She wears 3 pads a day that are sometimes soaked.  She voids every hour but probably could hold it for 2 hours.  She gets up once at night.  Flow was reasonable  She has had 3 back surgery for scoliosis.  She has had a hysterectomy.  No history of kidney stones bladder surgery and rarely gets a bladder infection.  Bowel function normal   PMH: Past Medical History:  Diagnosis Date   Arthritis    back   CHF (congestive heart failure) (HCC)    Depression    DVT (deep venous thrombosis) (HCC) 01/25/2020   GERD (gastroesophageal reflux disease)    Heart murmur    asymptomatic   Hypertension    PONV (postoperative nausea and vomiting)    Scoliosis    reports she has rods in her back    Surgical History: Past Surgical History:  Procedure Laterality Date   ABDOMINAL HYSTERECTOMY     BACK SURGERY  10/2000   for scoliosis   BREAST EXCISIONAL BIOPSY Right 1990's   NEG   BROW LIFT Bilateral 07/23/2020   Procedure: BLEPHAROPLASTY UPPER EYELID; W/EXCESS SKIN BLEPHAROPTOSIS REPAIR; RESECT EX BILATERAL;  Surgeon: Imagene Riches, MD;  Location: [facility];  Service: Ophthalmology;  Laterality: Bilateral;   COLONOSCOPY WITH PROPOFOL N/A 03/23/2020   Procedure: COLONOSCOPY WITH PROPOFOL;  Surgeon: Midge Minium, MD;  Location: [facility];  Service: Endoscopy;  Laterality: N/A;  priority 4   FEMUR  FRACTURE SURGERY Right 07/15/2009   Pertrochanteric femur fracture.   HIP FRACTURE SURGERY Right    PERIPHERAL VASCULAR THROMBECTOMY Left 11/11/2019   Procedure: PERIPHERAL VASCULAR THROMBECTOMY;  Surgeon: Annice Needy, MD;  Location: [facility] INVAS[facility] CV LAB;  Service: Cardiovascular;  Laterality: Left;   POLYPECTOMY  03/23/2020   Procedure: POLYPECTOMY;  Surgeon: Midge Minium, MD;  Location: [facility];  Service: Endoscopy;;   REVERSE SHOULDER ARTHROPLASTY Left 07/30/2021   Procedure: Left reverse shoulder arthroplasty, biceps tenodesis;  Surgeon: Signa Kell, MD;  Location: [facility] OR[facility];  Service: Orthopedics;  Laterality: Left;   REVISION TOTAL HIP ARTHROPLASTY Left     Home Medications:  Allergies as of 07/10/2023   No Known Allergies      Medication List        Accurate as of July 10, 2023 10:02 AM. If you have any questions, ask your nurse or doctor.          docusate sodium 100 MG capsule Commonly known as: COLACE Take 1 capsule (100 mg total) by mouth daily.   hydrocortisone cream 1 % Apply to affected area 2 times daily   Klor-Con M20 20 MEQ tablet Generic drug: potassium chloride SA Take 20 mEq by mouth daily.   metoprolol succinate 25 MG 24 hr tablet Commonly known as: TOPROL-XL Take 1 tablet (25 mg total) by mouth daily.   omeprazole 20 MG  capsule Commonly known as: PRILOSEC Take 1 capsule (20 mg total) by mouth every other day.   oxybutynin 10 MG 24 hr tablet Commonly known as: DITROPAN-XL Take 10 mg by mouth daily.   oxycodone 5 MG capsule Commonly known as: OXY-IR Take 2.5 mg by mouth every 6 (six) hours as needed. Give one tablet by mouth every 6 hours as needed   polyethylene glycol 17 g packet Commonly known as: MIRALAX / GLYCOLAX Take 17 g by mouth daily.   pregabalin 75 MG capsule Commonly known as: LYRICA Take 1 capsule (75 mg total) by mouth 3 (three) times daily for 7 days.   rivaroxaban 20 MG Tabs tablet Commonly known as:  XARELTO Take 1 tablet (20 mg total) by mouth daily with supper.   solifenacin 10 MG tablet Commonly known as: VESICARE Take 1 tablet (10 mg total) by mouth daily.   spironolactone 25 MG tablet Commonly known as: ALDACTONE Take 1 tablet (25 mg total) by mouth daily.   traZODone 100 MG tablet Commonly known as: DESYREL Take 2 tablets (200 mg total) by mouth at bedtime.   triamterene-hydrochlorothiazide 37.5-25 MG tablet Commonly known as: MAXZIDE-25 Take 1 tablet by mouth daily.   venlafaxine XR 75 MG 24 hr capsule Commonly known as: EFFEXOR-XR Take 1 capsule (75 mg total) by mouth daily.        Allergies: No Known Allergies  Family History: Family History  Problem Relation Age of Onset   Osteoarthritis Mother    Lung cancer Father    Diabetes Brother    Breast cancer Neg Hx     Social History:  reports that she has never smoked. She has never used smokeless tobacco. She reports that she does not drink alcohol and does not use drugs.  ROS:                                        Physical Exam: There were no vitals taken for this visit.  Constitutional:  Alert and oriented, No acute distress. HEENT: [place] AT, moist mucus membranes.  Trachea midline, no masses.   Laboratory Data: Lab Results  Component Value Date   WBC 10.4 04/13/2023   HGB 9.9 (A) 04/13/2023   HCT 31 (A) 04/13/2023   MCV 85.9 04/07/2023   PLT 356 04/13/2023    Lab Results  Component Value Date   CREATININE 0.8 04/13/2023    No results found for: "PSA"  No results found for: "TESTOSTERONE"  Lab Results  Component Value Date   HGBA1C 6.3 (H) 04/03/2023    Urinalysis    Component Value Date/Time   COLORURINE YELLOW (A) 04/03/2023 2134   APPEARANCEUR CLOUDY (A) 04/03/2023 2134   APPEARANCEUR Cloudy 10/25/2011 1308   LABSPEC 1.016 04/03/2023 2134   LABSPEC 1.015 10/25/2011 1308   PHURINE 5.0 04/03/2023 2134   GLUCOSEU NEGATIVE 04/03/2023 2134   GLUCOSEU  Negative 10/25/2011 1308   HGBUR NEGATIVE 04/03/2023 2134   BILIRUBINUR NEGATIVE 04/03/2023 2134   BILIRUBINUR neg 10/06/2015 1521   BILIRUBINUR Negative 10/25/2011 1308   KETONESUR NEGATIVE 04/03/2023 2134   PROTEINUR 30 (A) 04/03/2023 2134   UROBILINOGEN 0.2 10/06/2015 1521   NITRITE NEGATIVE 04/03/2023 2134   LEUKOCYTESUR LARGE (A) 04/03/2023 2134   LEUKOCYTESUR Negative 10/25/2011 1308    Pertinent Imaging: No urine.  Chart reviewed  Assessment & Plan: Patient has mixed incontinence but primarily urge incontinence over the  last few months.  Stop oxybutynin.  Get urine culture and residual next visit.  Return on Gemtesa samples and prescription for cystoscopy and pelvic examination.  I mention urodynamics without specifics but in my opinion she does not need them at this point  There are no diagnoses linked to this encounter.  No follow-ups on file.  Martina Sinner, MD  [facility] [address], [address] [place], [place] [address] [phone]

## 2023-07-24 ENCOUNTER — Ambulatory Visit: Payer: Medicare Other | Attending: Cardiology | Admitting: Cardiology

## 2023-07-24 ENCOUNTER — Encounter: Payer: Self-pay | Admitting: Cardiology

## 2023-07-24 VITALS — BP 136/80 | HR 92 | Ht 66.0 in | Wt 178.6 lb

## 2023-07-24 DIAGNOSIS — I10 Essential (primary) hypertension: Secondary | ICD-10-CM | POA: Diagnosis present

## 2023-07-24 DIAGNOSIS — R0609 Other forms of dyspnea: Secondary | ICD-10-CM | POA: Diagnosis present

## 2023-07-24 NOTE — Patient Instructions (Signed)
Medication Instructions:   Your physician recommends that you continue on your current medications as directed. Please refer to the Current Medication list given to you today.  *If you need a refill on your cardiac medications before your next appointment, please call your pharmacy*   Lab Work:  None Ordered  If you have labs (blood work) drawn today and your tests are completely normal, you will receive your results only by: MyChart Message (if you have MyChart) OR A paper copy in the mail If you have any lab test that is abnormal or we need to change your treatment, we will call you to review the results.   Testing/Procedures:  None Ordered   Follow-Up: At [facility], you and your health needs are our priority.  As part of our continuing mission to provide you with exceptional heart care, we have created designated Provider Care Teams.  These Care Teams include your primary Cardiologist (physician) and Advanced Practice Providers (APPs -  Physician Assistants and Nurse Practitioners) who all work together to provide you with the care you need, when you need it.  We recommend signing up for the patient portal called "MyChart".  Sign up information is provided on this After Visit Summary.  MyChart is used to connect with patients for Virtual Visits (Telemedicine).  Patients are able to view lab/test results, encounter notes, upcoming appointments, etc.  Non-urgent messages can be sent to your provider as well.   To learn more about what you can do with MyChart, go to [email or web address].    Your next appointment:  AS NEEDED 

## 2023-07-24 NOTE — Progress Notes (Signed)
Cardiology Office Note:    Date:  07/24/2023   ID:  Kristi Lee, DOB 1956-03-12, MRN [id number]  PCP:  Shane Crutch, [place]  [facility] Cardiologist:  Debbe Odea, MD  [facility] Electrophysiologist:  None   Referring MD: Shane Crutch, [place]   Chief Complaint  Patient presents with   Follow-up    Discuss cardiac testing results.  Patient denies new or acute cardiac problems/concerns today.      History of Present Illness:    Kristi Lee is a 67 y.o. female with a hx of hypertension, factor V [place], left lower extremity DVT s/p thrombolysis with tPA on Xarelto who presents for follow-up.   Previously seen with symptoms of shortness of breath, echocardiogram was obtained to evaluate any significant abnormalities.  Previous workup with CT and echo back in 2021 was unrevealing.  Presents for echocardiogram results.  Has no new concerns at this time.   Prior notes Echo 02/2020 showed normal systolic and diastolic function, EF 55-60 %. Coronary CTA 03/2020, calcium score of 0, no evidence of CAD.  Patient was diagnosed with extensive left lower extremity DVT involving most of the left common femoral vein as well as the popliteal and calf veins in March 2021.  She underwent catheter directed thrombolysis with TPA by vascular surgery and started on Xarelto.    Past Medical History:  Diagnosis Date   Arthritis    back   CHF (congestive heart failure) (HCC)    Depression    DVT (deep venous thrombosis) (HCC) 01/25/2020   GERD (gastroesophageal reflux disease)    Heart murmur    asymptomatic   Hypertension    PONV (postoperative nausea and vomiting)    Scoliosis    reports she has rods in her back    Past Surgical History:  Procedure Laterality Date   ABDOMINAL HYSTERECTOMY     BACK SURGERY  10/2000   for scoliosis   BREAST EXCISIONAL BIOPSY Right 1990's   NEG   BROW LIFT Bilateral 07/23/2020   Procedure: BLEPHAROPLASTY UPPER EYELID; W/EXCESS SKIN  BLEPHAROPTOSIS REPAIR; RESECT EX BILATERAL;  Surgeon: Imagene Riches, MD;  Location: [facility];  Service: Ophthalmology;  Laterality: Bilateral;   COLONOSCOPY WITH PROPOFOL N/A 03/23/2020   Procedure: COLONOSCOPY WITH PROPOFOL;  Surgeon: Midge Minium, MD;  Location: [facility];  Service: Endoscopy;  Laterality: N/A;  priority 4   FEMUR FRACTURE SURGERY Right 07/15/2009   Pertrochanteric femur fracture.   HIP FRACTURE SURGERY Right    PERIPHERAL VASCULAR THROMBECTOMY Left 11/11/2019   Procedure: PERIPHERAL VASCULAR THROMBECTOMY;  Surgeon: Annice Needy, MD;  Location: [facility] INVASIVE CV LAB;  Service: Cardiovascular;  Laterality: Left;   POLYPECTOMY  03/23/2020   Procedure: POLYPECTOMY;  Surgeon: Midge Minium, MD;  Location: [facility];  Service: Endoscopy;;   REVERSE SHOULDER ARTHROPLASTY Left 07/30/2021   Procedure: Left reverse shoulder arthroplasty, biceps tenodesis;  Surgeon: Signa Kell, MD;  Location: [facility] ORS;  Service: Orthopedics;  Laterality: Left;   REVISION TOTAL HIP ARTHROPLASTY Left     Current Medications: Current Meds  Medication Sig   docusate sodium (COLACE) 100 MG capsule Take 1 capsule (100 mg total) by mouth daily.   hydrocortisone cream 1 % Apply to affected area 2 times daily   KLOR-CON M20 20 MEQ tablet Take 20 mEq by mouth daily.   metoprolol succinate (TOPROL-XL) 25 MG 24 hr tablet Take 1 tablet (25 mg total) by mouth daily.   omeprazole (PRILOSEC) 20 MG capsule  Take 1 capsule (20 mg total) by mouth every other day.   oxybutynin (DITROPAN-XL) 10 MG 24 hr tablet Take 10 mg by mouth daily.   oxycodone (OXY-IR) 5 MG capsule Take 2.5 mg by mouth every 6 (six) hours as needed. Give one tablet by mouth every 6 hours as needed   polyethylene glycol (MIRALAX / GLYCOLAX) 17 g packet Take 17 g by mouth daily.   rivaroxaban (XARELTO) 20 MG TABS tablet Take 1 tablet (20 mg total) by mouth daily with supper.   solifenacin (VESICARE) 10 MG tablet Take 1  tablet (10 mg total) by mouth daily.   spironolactone (ALDACTONE) 25 MG tablet Take 1 tablet (25 mg total) by mouth daily.   traZODone (DESYREL) 100 MG tablet Take 2 tablets (200 mg total) by mouth at bedtime.   triamterene-hydrochlorothiazide (MAXZIDE-25) 37.5-25 MG tablet Take 1 tablet by mouth daily.   venlafaxine XR (EFFEXOR-XR) 75 MG 24 hr capsule Take 1 capsule (75 mg total) by mouth daily.   Vibegron (GEMTESA) 75 MG TABS Take 1 tablet (75 mg total) by mouth daily.     Allergies:   Patient has no known allergies.   Social History   Socioeconomic History   Marital status: Married    Spouse name: Not on file   Number of children: 1   Years of education: HS   Highest education level: Not on file  Occupational History   Occupation: Arts development officer: [employer]  Tobacco Use   Smoking status: Never   Smokeless tobacco: Never  Vaping Use   Vaping status: Never Used  Substance and Sexual Activity   Alcohol use: No   Drug use: No   Sexual activity: Not on file  Other Topics Concern   Not on file  Social History Narrative   Lives at home in private residence with husband   Social Determinants of Health   Financial Resource Strain: Low Risk  (04/19/2022)   Received from [facility]   Overall Financial Resource Strain (CARDIA)    Difficulty of Paying Living Expenses: Not hard at all  Food Insecurity: No Food Insecurity (04/04/2023)   Hunger Vital Sign    Worried About Running Out of Food in the Last Year: Never true    Ran Out of Food in the Last Year: Never true  Transportation Needs: No Transportation Needs (04/04/2023)   PRAPARE - Administrator, Civil Service (Medical): No    Lack of Transportation (Non-Medical): No  Physical Activity: Not on file  Stress: Not on file  Social Connections: Not on file     Family History: The patient's family history includes Diabetes in her brother; Lung cancer in her father; Osteoarthritis  in her mother. There is no history of Breast cancer.  ROS:   Please see the history of present illness.     All other systems reviewed and are negative.  EKGs/Labs/Other Studies Reviewed:    The following studies were reviewed today:        Recent Labs: 04/04/2023: ALT 24 04/13/2023: BUN 16; Creatinine 0.8; Hemoglobin 9.9; Platelets 356; Potassium 4.4; Sodium 144  Recent Lipid Panel    Component Value Date/Time   CHOL 189 04/26/2019 0910   TRIG 111 04/26/2019 0910   HDL 67 04/26/2019 0910   CHOLHDL 2.8 04/26/2019 0910   LDLCALC 102 (H) 04/26/2019 0910    Physical Exam:    VS:  BP 136/80 (BP Location: Left Arm, Patient Position:  Sitting, Cuff Size: Large)   Pulse 92   Ht 5\' 6"  (1.676 m)   Wt 178 lb 9.6 oz (81 kg)   SpO2 97%   BMI 28.83 kg/m     Wt Readings from Last 3 Encounters:  07/24/23 178 lb 9.6 oz (81 kg)  07/10/23 175 lb (79.4 kg)  06/16/23 175 lb (79.4 kg)     GEN:  Well nourished, well developed in no acute distress HEENT: Normal NECK: No JVD; No carotid bruits CARDIAC: RRR, no murmurs, rubs, gallops RESPIRATORY:  Clear to auscultation without rales, wheezing or rhonchi  ABDOMEN: Soft, non-tender, non-distended MUSCULOSKELETAL:  Trace to 1+ edema; No deformity  SKIN: Warm and dry, erythematous rash. NEUROLOGIC:  Alert and oriented x 3 PSYCHIATRIC:  Normal affect   ASSESSMENT:    1. Dyspnea on exertion   2. Essential hypertension    PLAN:    In order of problems listed above:  Dyspnea on exertion, chronic issue, ongoing over 3 years now.  Echo 06/2023 EF 60 to 65%, impaired relaxation.  Symptoms possibly from deconditioning.  Previous coronary CTA was normal with no CAD.  Previously referred to pulmonary medicine for any additional input.  Prior PFTs/follow-up with pulmonary medicine in 2021 did mention moderate restrictive lung disease. Hypertension, BP controlled.  Continue Maxzide, Aldactone, Toprol-XL.  Follow-up as needed.   This note was  generated in part or whole with voice recognition software. Voice recognition is usually quite accurate but there are transcription errors that can and very often do occur. I apologize for any typographical errors that were not detected and corrected.  Medication Adjustments/Labs and Tests Ordered: Current medicines are reviewed at length with the patient today.  Concerns regarding medicines are outlined above.  No orders of the defined types were placed in this encounter.   No orders of the defined types were placed in this encounter.    Patient Instructions  Medication Instructions:   Your physician recommends that you continue on your current medications as directed. Please refer to the Current Medication list given to you today.  *If you need a refill on your cardiac medications before your next appointment, please call your pharmacy*   Lab Work:  None Ordered  If you have labs (blood work) drawn today and your tests are completely normal, you will receive your results only by: MyChart Message (if you have MyChart) OR A paper copy in the mail If you have any lab test that is abnormal or we need to change your treatment, we will call you to review the results.   Testing/Procedures:  None Ordered   Follow-Up: At [facility], you and your health needs are our priority.  As part of our continuing mission to provide you with exceptional heart care, we have created designated Provider Care Teams.  These Care Teams include your primary Cardiologist (physician) and Advanced Practice Providers (APPs -  Physician Assistants and Nurse Practitioners) who all work together to provide you with the care you need, when you need it.  We recommend signing up for the patient portal called "MyChart".  Sign up information is provided on this After Visit Summary.  MyChart is used to connect with patients for Virtual Visits (Telemedicine).  Patients are able to view lab/test results,  encounter notes, upcoming appointments, etc.  Non-urgent messages can be sent to your provider as well.   To learn more about what you can do with MyChart, go to [email or web address].    Your next appointment:  AS NEEDED   Signed, Debbe Odea, MD  07/24/2023 12:57 PM    [facility]

## 2023-08-04 ENCOUNTER — Encounter: Payer: Self-pay | Admitting: Student in an Organized Health Care Education/Training Program

## 2023-08-04 ENCOUNTER — Other Ambulatory Visit
Admission: RE | Admit: 2023-08-04 | Discharge: 2023-08-04 | Disposition: A | Discharge status: Home / Self Care | Payer: Medicare Other | Source: Ambulatory Visit | Attending: Student in an Organized Health Care Education/Training Program | Admitting: Student in an Organized Health Care Education/Training Program

## 2023-08-04 ENCOUNTER — Ambulatory Visit (INDEPENDENT_AMBULATORY_CARE_PROVIDER_SITE_OTHER): Payer: Medicare Other | Admitting: Student in an Organized Health Care Education/Training Program

## 2023-08-04 VITALS — BP 134/88 | HR 85 | Temp 97.6°F | Ht 66.0 in | Wt 183.0 lb

## 2023-08-04 DIAGNOSIS — Z86718 Personal history of other venous thrombosis and embolism: Secondary | ICD-10-CM | POA: Diagnosis not present

## 2023-08-04 DIAGNOSIS — R0602 Shortness of breath: Secondary | ICD-10-CM | POA: Insufficient documentation

## 2023-08-04 DIAGNOSIS — Z7902 Long term (current) use of antithrombotics/antiplatelets: Secondary | ICD-10-CM | POA: Insufficient documentation

## 2023-08-04 LAB — CBC WITH DIFFERENTIAL/PLATELET
Abs Immature Granulocytes: 0.14 10*3/uL — ABNORMAL HIGH (ref 0.00–0.07)
Basophils Absolute: 0.1 10*3/uL (ref 0.0–0.1)
Basophils Relative: 1 %
Eosinophils Absolute: 0.3 10*3/uL (ref 0.0–0.5)
Eosinophils Relative: 3 %
HCT: 38.6 % (ref 36.0–46.0)
Hemoglobin: 11.8 g/dL — ABNORMAL LOW (ref 12.0–15.0)
Immature Granulocytes: 1 %
Lymphocytes Relative: 21 %
Lymphs Abs: 2.1 10*3/uL (ref 0.7–4.0)
MCH: 25.4 pg — ABNORMAL LOW (ref 26.0–34.0)
MCHC: 30.6 g/dL (ref 30.0–36.0)
MCV: 83.2 fL (ref 80.0–100.0)
Monocytes Absolute: 1 10*3/uL (ref 0.1–1.0)
Monocytes Relative: 10 %
Neutro Abs: 6.5 10*3/uL (ref 1.7–7.7)
Neutrophils Relative %: 64 %
Platelets: 278 10*3/uL (ref 150–400)
RBC: 4.64 MIL/uL (ref 3.87–5.11)
RDW: 15.7 % — ABNORMAL HIGH (ref 11.5–15.5)
WBC: 10.2 10*3/uL (ref 4.0–10.5)
nRBC: 0 % (ref 0.0–0.2)

## 2023-08-04 LAB — BASIC METABOLIC PANEL
Anion gap: 11 (ref 5–15)
BUN: 17 mg/dL (ref 8–23)
CO2: 27 mmol/L (ref 22–32)
Calcium: 9 mg/dL (ref 8.9–10.3)
Chloride: 101 mmol/L (ref 98–111)
Creatinine, Ser: 0.76 mg/dL (ref 0.44–1.00)
GFR, Estimated: >60 mL/min (ref >60)
Glucose, Bld: 119 mg/dL — ABNORMAL HIGH (ref 70–99)
Potassium: 3.3 mmol/L — ABNORMAL LOW (ref 3.5–5.1)
Sodium: 139 mmol/L (ref 135–145)

## 2023-08-04 LAB — BRAIN NATRIURETIC PEPTIDE: B Natriuretic Peptide: 36.3 pg/mL (ref 0.0–100.0)

## 2023-08-04 NOTE — Progress Notes (Unsigned)
Synopsis: Referred in *** by Debbe Odea, MD  Assessment & Plan:   1. Shortness of breath (Primary) *** - [facility] Perfusion; Future - Pulmonary Function Test [facility] Only; Future - Home sleep test; Future - B Nat Peptide; Future - Basic metabolic panel; Future - CBC w/Diff; Future  Presenting for shortness of breath, chronic, seen in 2021 by ramaswamy, ILD workup negative, PFT's with restriction. Follows cardiology, echo performed, referred back to [place].  Short of breath with exertion but no other symptoms. No cough, no chest pain, no chest tightness, no sputum, no wheeze. 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   History of DVT, on rivaroxaban. Will workup with repeat PFT's (including MIP/MEP), sleep study (reports snoring, exam concerning for risk of OSA), BNP (has edema in the lower extremities - will consider RHC next), and V/Q scan  Return in about 3 months (around 11/02/2023).  I spent *** minutes caring for this patient today, including {EM billing:[id number]}  Raechel Chute, MD [facility] 08/04/2023 9:28 AM    End of visit medications:  No orders of the defined types were placed in this encounter.    Current Outpatient Medications:    docusate sodium (COLACE) 100 MG capsule, Take 1 capsule (100 mg total) by mouth daily., Disp: , Rfl:    hydrocortisone cream 1 %, Apply to affected area 2 times daily, Disp: 15 g, Rfl: 0   KLOR-CON M20 20 MEQ tablet, Take 20 mEq by mouth daily., Disp: , Rfl:    metoprolol succinate (TOPROL-XL) 25 MG 24 hr tablet, Take 1 tablet (25 mg total) by mouth daily., Disp: 30 tablet, Rfl: 0   omeprazole (PRILOSEC) 20 MG capsule, Take 1 capsule (20 mg total) by mouth every other day., Disp: 15 capsule, Rfl: 30   oxybutynin (DITROPAN-XL) 10 MG 24 hr tablet, Take 10 mg by mouth daily., Disp: , Rfl:    oxycodone (OXY-IR) 5 MG capsule, Take 2.5 mg by mouth every 6 (six) hours as needed. Give one tablet by mouth  every 6 hours as needed, Disp: , Rfl:    polyethylene glycol (MIRALAX / GLYCOLAX) 17 g packet, Take 17 g by mouth daily., Disp: , Rfl:    pregabalin (LYRICA) 75 MG capsule, Take 1 capsule (75 mg total) by mouth 3 (three) times daily for 7 days., Disp: 21 capsule, Rfl: 0   rivaroxaban (XARELTO) 20 MG TABS tablet, Take 1 tablet (20 mg total) by mouth daily with supper., Disp: 30 tablet, Rfl: 0   solifenacin (VESICARE) 10 MG tablet, Take 1 tablet (10 mg total) by mouth daily., Disp: 30 tablet, Rfl: 0   spironolactone (ALDACTONE) 25 MG tablet, Take 1 tablet (25 mg total) by mouth daily., Disp: 30 tablet, Rfl: 0   traZODone (DESYREL) 100 MG tablet, Take 2 tablets (200 mg total) by mouth at bedtime., Disp: 60 tablet, Rfl: 0   triamterene-hydrochlorothiazide (MAXZIDE-25) 37.5-25 MG tablet, Take 1 tablet by mouth daily., Disp: , Rfl:    venlafaxine XR (EFFEXOR-XR) 75 MG 24 hr capsule, Take 1 capsule (75 mg total) by mouth daily., Disp: 30 capsule, Rfl: 0   Vibegron (GEMTESA) 75 MG TABS, Take 1 tablet (75 mg total) by mouth daily., Disp: 56 tablet, Rfl:    Subjective:   PATIENT ID: Kristi Lee GENDER: female DOB: 1956-04-08, MRN: [id number]  Chief Complaint  Patient presents with   Follow-up    Shortness of breath on exertion. No cough or wheezing.    HPI  The patient is a very pleasant 67 year old female presenting to clinic for the evaluation of shortness of breath.  Patient reports that her symptoms have been chronic and she is experienced shortness of breath for a few years now.  She was previously seen in our clinic in 2021 with Dr. Marchelle Gearing for evaluation of this chief complaint.  She feels short of breath with exertion but is otherwise asymptomatic.  She denies shortness of breath at rest.  She denies cough, chest pain, chest tightness, sputum production, hemoptysis, wheezing, fevers, chills, night sweats, or weight loss.  On the contrary, she reports some weight gain.  Patient reports  having her shortness of breath improved in the past with efforts at weight loss.  Given said symptoms, she was seen by cardiology earlier this month.  She has had repeat echocardiogram in November that showed normal LV function with impaired relaxation, and normal RV systolic function (unable to assess PA pressure).  She was previously worked up with a high-resolution chest CT that did not show signs of interstitial lung disease and pulmonary function testing.  Trial of Symbicort was not helpful to help with the symptoms in 2022.  She has not had sleep studies to evaluate for sleep apnea      Short of breath with exertion but no other symptoms. No cough, no chest pain, no chest tightness, no sputum, no wheeze. 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   History of DVT, on rivaroxaban. Will workup with repeat PFT's (including MIP/MEP), sleep study (reports snoring, exam concerning for risk of OSA), BNP (has edema in the lower extremities - will consider RHC next), and V/Q scan  Past medical history is notable for hypertension, factor V Leiden, and history of DVT status post thrombolysis currently on rivaroxaban.  Ancillary information including prior medications, full medical/surgical/family/social histories, and PFTs (when available) are listed below and have been reviewed.   ROS   Objective:   Vitals:   08/04/23 0902  BP: 134/88  Pulse: 85  Temp: 97.6 F (36.4 C)  TempSrc: Temporal  SpO2: 99%  Weight: 183 lb (83 kg)  Height: 5\' 6"  (1.676 m)   99% on *** LPM *** RA BMI Readings from Last 3 Encounters:  08/04/23 29.54 kg/m  07/24/23 28.83 kg/m  07/10/23 28.25 kg/m   Wt Readings from Last 3 Encounters:  08/04/23 183 lb (83 kg)  07/24/23 178 lb 9.6 oz (81 kg)  07/10/23 175 lb (79.4 kg)    Physical Exam    Ancillary Information    Past Medical History:  Diagnosis Date   Arthritis    back   CHF (congestive heart failure) (HCC)    Depression    DVT  (deep venous thrombosis) (HCC) 01/25/2020   GERD (gastroesophageal reflux disease)    Heart murmur    asymptomatic   Hypertension    PONV (postoperative nausea and vomiting)    Scoliosis    reports she has rods in her back     Family History  Problem Relation Age of Onset   Osteoarthritis Mother    Lung cancer Father    Diabetes Brother    Breast cancer Neg Hx      Past Surgical History:  Procedure Laterality Date   ABDOMINAL HYSTERECTOMY     BACK SURGERY  10/2000   for scoliosis   BREAST EXCISIONAL BIOPSY Right 1990's   NEG   BROW LIFT Bilateral 07/23/2020   Procedure: BLEPHAROPLASTY UPPER EYELID; W/EXCESS SKIN BLEPHAROPTOSIS REPAIR; RESECT EX  BILATERAL;  Surgeon: Imagene Riches, MD;  Location: [facility];  Service: Ophthalmology;  Laterality: Bilateral;   COLONOSCOPY WITH PROPOFOL N/A 03/23/2020   Procedure: COLONOSCOPY WITH PROPOFOL;  Surgeon: Midge Minium, MD;  Location: [facility];  Service: Endoscopy;  Laterality: N/A;  priority 4   FEMUR FRACTURE SURGERY Right 07/15/2009   Pertrochanteric femur fracture.   HIP FRACTURE SURGERY Right    PERIPHERAL VASCULAR THROMBECTOMY Left 11/11/2019   Procedure: PERIPHERAL VASCULAR THROMBECTOMY;  Surgeon: Annice Needy, MD;  Location: [facility] INVAS[facility] CV LAB;  Service: Cardiovascular;  Laterality: Left;   POLYPECTOMY  03/23/2020   Procedure: POLYPECTOMY;  Surgeon: Midge Minium, MD;  Location: [facility];  Service: Endoscopy;;   REVERSE SHOULDER ARTHROPLASTY Left 07/30/2021   Procedure: Left reverse shoulder arthroplasty, biceps tenodesis;  Surgeon: Signa Kell, MD;  Location: [facility] ORS;  Service: Orthopedics;  Laterality: Left;   REVISION TOTAL HIP ARTHROPLASTY Left     Social History   Socioeconomic History   Marital status: Married    Spouse name: Not on file   Number of children: 1   Years of education: HS   Highest education level: Not on file  Occupational History   Occupation: Best boy: [employer]  Tobacco Use   Smoking status: Never   Smokeless tobacco: Never  Vaping Use   Vaping status: Never Used  Substance and Sexual Activity   Alcohol use: No   Drug use: No   Sexual activity: Not on file  Other Topics Concern   Not on file  Social History Narrative   Lives at home in private residence with husband   Social Drivers of Health   Financial Resource Strain: Low Risk  (08/03/2023)   [facility]   Overall Financial Resource Strain (CARDIA)    Difficulty of Paying Living Expenses: Not very hard  Food Insecurity: No Food Insecurity (08/03/2023)   Received from [facility]   Hunger Vital Sign    Worried About Running Out of Food in the Last Year: Never true    Ran Out of Food in the Last Year: Never true  Transportation Needs: No Transportation Needs (08/03/2023)   Received from [facility]    In the past 12 months, has lack of transportation kept you from medical appointments or from getting medications?: No    Lack of Transportation (Non-Medical): No  Physical Activity: Not on file  Stress: Not on file  Social Connections: Not on file  Intimate Partner Violence: Not At Risk (04/04/2023)   Humiliation, Afraid, Rape, and Kick questionnaire    Fear of Current or Ex-Partner: No    Emotionally Abused: No    Physically Abused: No    Sexually Abused: No     No Known Allergies   CBC    Component Value Date/Time   WBC 10.4 04/13/2023 0000   WBC 8.4 04/07/2023 0718   RBC 3.53 (A) 04/13/2023 0000   HGB 9.9 (A) 04/13/2023 0000   HGB 12.7 04/26/2019 0910   HCT 31 (A) 04/13/2023 0000   HCT 38.3 04/26/2019 0910   PLT 356 04/13/2023 0000   PLT 216 04/26/2019 0910   MCV 85.9 04/07/2023 0718   MCV 84 04/26/2019 0910   MCV 84 10/25/2011 1308   MCH 28.0 04/07/2023 0718   MCHC 32.6 04/07/2023 0718   RDW 16.1 (H) 04/07/2023 0718   RDW 13.1 04/26/2019 0910  RDW 15.2 (H) 10/25/2011 1308   LYMPHSABS 0.3 (L) 04/05/2023 0409   LYMPHSABS 2.0 04/26/2019 0910   MONOABS 0.6 04/05/2023 0409   EOSABS 0.3 04/05/2023 0409   EOSABS 0.2 04/26/2019 0910   BASOSABS 0.0 04/05/2023 0409   BASOSABS 0.1 04/26/2019 0910    Pulmonary Functions Testing Results:    Latest Ref Rng & Units 08/03/2020    9:22 AM  PFT Results  FVC-Pre L 1.84   FVC-Predicted Pre % 53   FVC-Post L 1.64   FVC-Predicted Post % 48   Pre FEV1/FVC % % 74   Post FEV1/FCV % % 82   FEV1-Pre L 1.36   FEV1-Predicted Pre % 52   FEV1-Post L 1.34   DLCO uncorrected ml/min/mmHg 12.37   DLCO UNC% % 58   DLVA Predicted % 85   TLC L 3.71   TLC % Predicted % 69   RV % Predicted % 65     Outpatient Medications Prior to Visit  Medication Sig Dispense Refill   docusate sodium (COLACE) 100 MG capsule Take 1 capsule (100 mg total) by mouth daily.     hydrocortisone cream 1 % Apply to affected area 2 times daily 15 g 0   KLOR-CON M20 20 MEQ tablet Take 20 mEq by mouth daily.     metoprolol succinate (TOPROL-XL) 25 MG 24 hr tablet Take 1 tablet (25 mg total) by mouth daily. 30 tablet 0   omeprazole (PRILOSEC) 20 MG capsule Take 1 capsule (20 mg total) by mouth every other day. 15 capsule 30   oxybutynin (DITROPAN-XL) 10 MG 24 hr tablet Take 10 mg by mouth daily.     oxycodone (OXY-IR) 5 MG capsule Take 2.5 mg by mouth every 6 (six) hours as needed. Give one tablet by mouth every 6 hours as needed     polyethylene glycol (MIRALAX / GLYCOLAX) 17 g packet Take 17 g by mouth daily.     pregabalin (LYRICA) 75 MG capsule Take 1 capsule (75 mg total) by mouth 3 (three) times daily for 7 days. 21 capsule 0   rivaroxaban (XARELTO) 20 MG TABS tablet Take 1 tablet (20 mg total) by mouth daily with supper. 30 tablet 0   solifenacin (VESICARE) 10 MG tablet Take 1 tablet (10 mg total) by mouth daily. 30 tablet 0   spironolactone (ALDACTONE) 25 MG tablet Take 1 tablet (25 mg total) by mouth daily. 30 tablet 0    traZODone (DESYREL) 100 MG tablet Take 2 tablets (200 mg total) by mouth at bedtime. 60 tablet 0   triamterene-hydrochlorothiazide (MAXZIDE-25) 37.5-25 MG tablet Take 1 tablet by mouth daily.     venlafaxine XR (EFFEXOR-XR) 75 MG 24 hr capsule Take 1 capsule (75 mg total) by mouth daily. 30 capsule 0   Vibegron (GEMTESA) 75 MG TABS Take 1 tablet (75 mg total) by mouth daily. 56 tablet    No facility-administered medications prior to visit.

## 2023-08-08 ENCOUNTER — Ambulatory Visit
Admission: RE | Admit: 2023-08-08 | Discharge: 2023-08-08 | Disposition: A | Discharge status: Home / Self Care | Payer: Medicare Other | Source: Ambulatory Visit | Attending: Student in an Organized Health Care Education/Training Program | Admitting: Student in an Organized Health Care Education/Training Program

## 2023-08-08 ENCOUNTER — Encounter
Admission: RE | Admit: 2023-08-08 | Discharge: 2023-08-08 | Disposition: A | Discharge status: Home / Self Care | Payer: Medicare Other | Source: Ambulatory Visit | Attending: Student in an Organized Health Care Education/Training Program | Admitting: Student in an Organized Health Care Education/Training Program

## 2023-08-08 DIAGNOSIS — R0602 Shortness of breath: Secondary | ICD-10-CM

## 2023-08-08 MED ORDER — TECHNETIUM TO 99M ALBUMIN AGGREGATED
4.3500 | Freq: Once | INTRAVENOUS | Status: AC | PRN
  Administered 2023-08-08: 4.35 via INTRAVENOUS

## 2023-08-10 DIAGNOSIS — R0602 Shortness of breath: Secondary | ICD-10-CM

## 2023-08-11 ENCOUNTER — Ambulatory Visit: Payer: Medicare Other | Attending: Student in an Organized Health Care Education/Training Program

## 2023-08-11 DIAGNOSIS — R0602 Shortness of breath: Secondary | ICD-10-CM | POA: Diagnosis present

## 2023-08-11 DIAGNOSIS — R0609 Other forms of dyspnea: Secondary | ICD-10-CM | POA: Diagnosis not present

## 2023-08-11 MED ORDER — ALBUTEROL SULFATE (2.5 MG/3ML) 0.083% IN NEBU
2.5000 mg | INHALATION_SOLUTION | Freq: Once | RESPIRATORY_TRACT | Status: AC
  Administered 2023-08-11: 2.5 mg via RESPIRATORY_TRACT
  Filled 2023-08-11: qty 3

## 2023-08-23 ENCOUNTER — Ambulatory Visit
Admission: RE | Admit: 2023-08-23 | Discharge: 2023-08-23 | Disposition: A | Discharge status: Home / Self Care | Payer: Medicare Other | Attending: Family Medicine | Admitting: Family Medicine

## 2023-08-23 ENCOUNTER — Ambulatory Visit
Admission: RE | Admit: 2023-08-23 | Discharge: 2023-08-23 | Disposition: A | Discharge status: Home / Self Care | Payer: Medicare Other | Source: Ambulatory Visit | Attending: Family Medicine | Admitting: Family Medicine

## 2023-08-23 ENCOUNTER — Other Ambulatory Visit: Payer: Self-pay | Admitting: Family Medicine

## 2023-08-23 DIAGNOSIS — R0789 Other chest pain: Secondary | ICD-10-CM

## 2023-08-24 ENCOUNTER — Telehealth: Payer: Self-pay | Admitting: Urology

## 2023-08-24 MED ORDER — GEMTESA 75 MG PO TABS: 75.0000 mg | ORAL_TABLET | Freq: Every day | ORAL | 11 refills | Status: DC

## 2023-08-24 NOTE — Telephone Encounter (Signed)
rx sent to pharmacy by e-script  

## 2023-08-24 NOTE — Telephone Encounter (Signed)
 She tried the [employer] and worked well.She would like  rx called into [facility] on [address].

## 2023-08-28 ENCOUNTER — Telehealth: Payer: Self-pay | Admitting: Urology

## 2023-08-28 MED ORDER — SOLIFENACIN SUCCINATE 5 MG PO TABS: 5.0000 mg | ORAL_TABLET | Freq: Every day | ORAL | 11 refills | Status: DC

## 2023-08-28 NOTE — Telephone Encounter (Signed)
 Patient called and stated that Gemtesa  is too expensive (197.00) even with her insurance. She is almost out of samples that Dr. MacDiarmid gave her. She said it is working well. Can she get more samples, or is there another medication that can be prescribed that is less expensive. Pharmacy is [facility] in [place].

## 2023-08-28 NOTE — Telephone Encounter (Signed)
 Spoke with patient and advised results rx sent to pharmacy by e-script

## 2023-09-05 ENCOUNTER — Ambulatory Visit: Payer: Medicare Other | Admitting: Student in an Organized Health Care Education/Training Program

## 2023-09-05 ENCOUNTER — Encounter: Payer: Self-pay | Admitting: Student in an Organized Health Care Education/Training Program

## 2023-09-05 VITALS — BP 134/80 | HR 93 | Temp 97.6°F | Ht 66.0 in | Wt 181.4 lb

## 2023-09-05 DIAGNOSIS — D6851 Activated protein C resistance: Secondary | ICD-10-CM | POA: Diagnosis not present

## 2023-09-05 DIAGNOSIS — R0602 Shortness of breath: Secondary | ICD-10-CM

## 2023-09-05 DIAGNOSIS — I5032 Chronic diastolic (congestive) heart failure: Secondary | ICD-10-CM

## 2023-09-05 MED ORDER — BREZTRI AEROSPHERE 160-9-4.8 MCG/ACT IN AERO
2.0000 | INHALATION_SPRAY | Freq: Two times a day (BID) | RESPIRATORY_TRACT | Status: DC
Start: 2023-09-05 — End: 2024-01-15

## 2023-09-05 NOTE — Progress Notes (Signed)
Assessment & Plan:   1. Shortness of breath (Primary)  Patient is presenting for the evaluation of shortness of breath, previously evaluated for ILD with a high resolution chest CT that did not show signs of ILD. We've obtained repeat PFT's prior to today's visit that showed a mixed obstructive and restrictive defect. While lung exam is within normal and there is no wheeze noted, there was some response to bronchodilator challenge on the spirometry but this is below the threshold. Nonetheless, I am inclined to trial a long acting bronchodilator for a few weeks and assess for symptomatic response.  She also has a history of DVT in the setting factor V Leiden. Given risk for CTEPH, perfusion study was obtained and was read as low risk, making CTEPH less likely. Other considerations on the differential diagnosis included neuromuscular weakness but MIP/MEP were not performed during PFT's and we will try and re-arrange for those. She's also had mild lower extremity edema but heart failure workup (TTE and BNP were not impressive). Should symptoms persist, we will consider referring back to cardiology for a right heart cath. I also recommended that the patient attempt to loose weight to assess whether this is contributing to the restrictive pattern on her PFT's.  Finally, home sleep study ordered during our prior visit was obtained and results reviewed. There is no sign of OSA on the sleep study.  - MIP/MEP - weight loss recommended   Return in about 3 months (around 12/04/2023).  I spent 30 minutes caring for this patient today, including preparing to see the patient, obtaining a medical history , reviewing a separately obtained history, performing a medically appropriate examination and/or evaluation, counseling and educating the patient/family/caregiver, ordering medications, tests, or procedures, and documenting clinical information in the electronic health record  Raechel Chute, MD [place]  [facility] 09/05/2023 2:20 PM    End of visit medications:  No orders of the defined types were placed in this encounter.    Current Outpatient Medications:    docusate sodium (COLACE) 100 MG capsule, Take 1 capsule (100 mg total) by mouth daily., Disp: , Rfl:    hydrocortisone cream 1 %, Apply to affected area 2 times daily, Disp: 15 g, Rfl: 0   KLOR-CON M20 20 MEQ tablet, Take 20 mEq by mouth daily., Disp: , Rfl:    metoprolol succinate (TOPROL-XL) 25 MG 24 hr tablet, Take 1 tablet (25 mg total) by mouth daily., Disp: 30 tablet, Rfl: 0   omeprazole (PRILOSEC) 20 MG capsule, Take 1 capsule (20 mg total) by mouth every other day., Disp: 15 capsule, Rfl: 30   oxycodone (OXY-IR) 5 MG capsule, Take 2.5 mg by mouth every 6 (six) hours as needed. Give one tablet by mouth every 6 hours as needed, Disp: , Rfl:    polyethylene glycol (MIRALAX / GLYCOLAX) 17 g packet, Take 17 g by mouth daily., Disp: , Rfl:    rivaroxaban (XARELTO) 20 MG TABS tablet, Take 1 tablet (20 mg total) by mouth daily with supper., Disp: 30 tablet, Rfl: 0   solifenacin (VESICARE) 5 MG tablet, Take 1 tablet (5 mg total) by mouth daily., Disp: 30 tablet, Rfl: 11   spironolactone (ALDACTONE) 25 MG tablet, Take 1 tablet (25 mg total) by mouth daily., Disp: 30 tablet, Rfl: 0   traZODone (DESYREL) 100 MG tablet, Take 2 tablets (200 mg total) by mouth at bedtime., Disp: 60 tablet, Rfl: 0   triamterene-hydrochlorothiazide (MAXZIDE-25) 37.5-25 MG tablet, Take 1 tablet by mouth  daily., Disp: , Rfl:    venlafaxine XR (EFFEXOR-XR) 75 MG 24 hr capsule, Take 1 capsule (75 mg total) by mouth daily., Disp: 30 capsule, Rfl: 0   pregabalin (LYRICA) 75 MG capsule, Take 1 capsule (75 mg total) by mouth 3 (three) times daily for 7 days., Disp: 21 capsule, Rfl: 0   Subjective:   PATIENT ID: Kristi Lee GENDER: female DOB: 1956-02-24, MRN: [id number]  Chief Complaint  Patient presents with   Follow-up    Cough. Shortness  of breath on exertion and wheezing.     HPI  The patient is a very pleasant 68 year old female presenting to clinic for follow up. She has had her PFT's and Perfusion study and is here to discuss results. She's also had a home sleep study.  Symptoms are unchanged since our initial visit in December of 2024. She continues to experience exertional dyspnea, but denies cough, sputum production, chest pain, or chest tightness. Symptoms are chronic and have been ongoing for a few years now. She denies shortness of breath at rest, denies fevers, chills, wheeze, or night sweats. She was previously seen in our clinic in 2021 with Dr. Marchelle Gearing for the evaluation of this chief complaint. Patient reports having her shortness of breath improved in the past with efforts at weight loss.   Given said symptoms, she was seen by cardiology in December of 2024. She has had an echocardiogram in November that showed normal LV function with impaired relaxation, and normal RV systolic function (unable to assess PA pressure).  She was previously worked up with a high-resolution chest CT that did not show signs of interstitial lung disease. A previous trial of Symbicort did not help with symptom resolution.   Past medical history is notable for hypertension, factor V Leiden, and history of DVT status post thrombolysis currently on rivaroxaban.   Patient denies any history of smoking. 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   Ancillary information including prior medications, full medical/surgical/family/social histories, and PFTs (when available) are listed below and have been reviewed.   Review of Systems  Constitutional:  Negative for chills, fever, malaise/fatigue and weight loss.  Respiratory:  Positive for shortness of breath. Negative for cough, hemoptysis, sputum production and wheezing.   Cardiovascular:  Negative for chest pain.     Objective:   Vitals:   09/05/23 0958  BP:  134/80  Pulse: 93  Temp: 97.6 F (36.4 C)  TempSrc: Temporal  SpO2: 97%  Weight: 181 lb 6.4 oz (82.3 kg)  Height: 5\' 6"  (1.676 m)   97% on RA  BMI Readings from Last 3 Encounters:  09/05/23 29.28 kg/m  08/04/23 29.54 kg/m  07/24/23 28.83 kg/m   Wt Readings from Last 3 Encounters:  09/05/23 181 lb 6.4 oz (82.3 kg)  08/04/23 183 lb (83 kg)  07/24/23 178 lb 9.6 oz (81 kg)    Physical Exam Constitutional:      Appearance: Normal appearance. She is obese.  Cardiovascular:     Rate and Rhythm: Normal rate and regular rhythm.     Pulses: Normal pulses.     Heart sounds: Normal heart sounds.  Pulmonary:     Breath sounds: Normal breath sounds.  Abdominal:     Palpations: Abdomen is soft.  Musculoskeletal:     Right lower leg: Edema present.     Left lower leg: Edema present.  Neurological:     General: No focal deficit present.     Mental Status: She is alert  and oriented to person, place, and time. Mental status is at baseline.       Ancillary Information    Past Medical History:  Diagnosis Date   Arthritis    back   CHF (congestive heart failure) (HCC)    Depression    DVT (deep venous thrombosis) (HCC) 01/25/2020   GERD (gastroesophageal reflux disease)    Heart murmur    asymptomatic   Hypertension    PONV (postoperative nausea and vomiting)    Scoliosis    reports she has rods in her back     Family History  Problem Relation Age of Onset   Osteoarthritis Mother    Lung cancer Father    Diabetes Brother    Breast cancer Neg Hx      Past Surgical History:  Procedure Laterality Date   ABDOMINAL HYSTERECTOMY     BACK SURGERY  10/2000   for scoliosis   BREAST EXCISIONAL BIOPSY Right 1990's   NEG   BROW LIFT Bilateral 07/23/2020   Procedure: BLEPHAROPLASTY UPPER EYELID; W/EXCESS SKIN BLEPHAROPTOSIS REPAIR; RESECT EX BILATERAL;  Surgeon: Imagene Riches, MD;  Location: [facility];  Service: Ophthalmology;  Laterality: Bilateral;    COLONOSCOPY WITH PROPOFOL N/A 03/23/2020   Procedure: COLONOSCOPY WITH PROPOFOL;  Surgeon: Midge Minium, MD;  Location: [facility];  Service: Endoscopy;  Laterality: N/A;  priority 4   FEMUR FRACTURE SURGERY Right 07/15/2009   Pertrochanteric femur fracture.   HIP FRACTURE SURGERY Right    PERIPHERAL VASCULAR THROMBECTOMY Left 11/11/2019   Procedure: PERIPHERAL VASCULAR THROMBECTOMY;  Surgeon: Annice Needy, MD;  Location: [facility] INVASIVE CV LAB;  Service: Cardiovascular;  Laterality: Left;   POLYPECTOMY  03/23/2020   Procedure: POLYPECTOMY;  Surgeon: Midge Minium, MD;  Location: [facility];  Service: Endoscopy;;   REVERSE SHOULDER ARTHROPLASTY Left 07/30/2021   Procedure: Left reverse shoulder arthroplasty, biceps tenodesis;  Surgeon: Signa Kell, MD;  Location: [facility] ORS;  Service: Orthopedics;  Laterality: Left;   REVISION TOTAL HIP ARTHROPLASTY Left     Social History   Socioeconomic History   Marital status: Married    Spouse name: Not on file   Number of children: 1   Years of education: HS   Highest education level: Not on file  Occupational History   Occupation: Arts development officer: [employer]  Tobacco Use   Smoking status: Never   Smokeless tobacco: Never  Vaping Use   Vaping status: Never Used  Substance and Sexual Activity   Alcohol use: No   Drug use: No   Sexual activity: Not on file  Other Topics Concern   Not on file  Social History Narrative   Lives at home in private residence with husband   Social Drivers of Health   Financial Resource Strain: Low Risk  (08/03/2023)   Received from [facility]   Overall Financial Resource Strain (CARDIA)    Difficulty of Paying Living Expenses: Not very hard  Food Insecurity: No Food Insecurity (08/03/2023)   Received from [facility]   Hunger Vital Sign    Worried About Running Out of Food in the Last Year: Never true    Ran Out of Food in the Last Year:  Never true  Transportation Needs: No Transportation Needs (08/03/2023)   Received from [facility] - Transportation    In the past 12 months, has lack of transportation kept you from medical appointments or from getting  medications?: No    Lack of Transportation (Non-Medical): No  Physical Activity: Not on file  Stress: Not on file  Social Connections: Not on file  Intimate Partner Violence: Not At Risk (04/04/2023)   Humiliation, Afraid, Rape, and Kick questionnaire    Fear of Current or Ex-Partner: No    Emotionally Abused: No    Physically Abused: No    Sexually Abused: No     No Known Allergies   CBC    Component Value Date/Time   WBC 10.2 08/04/2023 1032   RBC 4.64 08/04/2023 1032   HGB 11.8 (L) 08/04/2023 1032   HGB 12.7 04/26/2019 0910   HCT 38.6 08/04/2023 1032   HCT 38.3 04/26/2019 0910   PLT 278 08/04/2023 1032   PLT 216 04/26/2019 0910   MCV 83.2 08/04/2023 1032   MCV 84 04/26/2019 0910   MCV 84 10/25/2011 1308   MCH 25.4 (L) 08/04/2023 1032   MCHC 30.6 08/04/2023 1032   RDW 15.7 (H) 08/04/2023 1032   RDW 13.1 04/26/2019 0910   RDW 15.2 (H) 10/25/2011 1308   LYMPHSABS 2.1 08/04/2023 1032   LYMPHSABS 2.0 04/26/2019 0910   MONOABS 1.0 08/04/2023 1032   EOSABS 0.3 08/04/2023 1032   EOSABS 0.2 04/26/2019 0910   BASOSABS 0.1 08/04/2023 1032   BASOSABS 0.1 04/26/2019 0910    Pulmonary Functions Testing Results:    Latest Ref Rng & Units 08/11/2023    3:57 PM 08/03/2020    9:22 AM  PFT Results  FVC-Pre L 1.71  1.84   FVC-Predicted Pre % 51  53   FVC-Post L 1.95  1.64   FVC-Predicted Post % 58  48   Pre FEV1/FVC % % 74  74   Post FEV1/FCV % % 83  82   FEV1-Pre L 1.27  1.36   FEV1-Predicted Pre % 50  52   FEV1-Post L 1.61  1.34   DLCO uncorrected ml/min/mmHg 12.19  12.37   DLCO UNC% % 58  58   DLVA Predicted % 85  85   TLC L 3.74  3.71   TLC % Predicted % 69  69   RV % Predicted % 66  65     Outpatient Medications  Prior to Visit  Medication Sig Dispense Refill   docusate sodium (COLACE) 100 MG capsule Take 1 capsule (100 mg total) by mouth daily.     hydrocortisone cream 1 % Apply to affected area 2 times daily 15 g 0   KLOR-CON M20 20 MEQ tablet Take 20 mEq by mouth daily.     metoprolol succinate (TOPROL-XL) 25 MG 24 hr tablet Take 1 tablet (25 mg total) by mouth daily. 30 tablet 0   omeprazole (PRILOSEC) 20 MG capsule Take 1 capsule (20 mg total) by mouth every other day. 15 capsule 30   oxycodone (OXY-IR) 5 MG capsule Take 2.5 mg by mouth every 6 (six) hours as needed. Give one tablet by mouth every 6 hours as needed     polyethylene glycol (MIRALAX / GLYCOLAX) 17 g packet Take 17 g by mouth daily.     rivaroxaban (XARELTO) 20 MG TABS tablet Take 1 tablet (20 mg total) by mouth daily with supper. 30 tablet 0   solifenacin (VESICARE) 5 MG tablet Take 1 tablet (5 mg total) by mouth daily. 30 tablet 11   spironolactone (ALDACTONE) 25 MG tablet Take 1 tablet (25 mg total) by mouth daily. 30 tablet 0   traZODone (DESYREL) 100 MG tablet Take  2 tablets (200 mg total) by mouth at bedtime. 60 tablet 0   triamterene-hydrochlorothiazide (MAXZIDE-25) 37.5-25 MG tablet Take 1 tablet by mouth daily.     venlafaxine XR (EFFEXOR-XR) 75 MG 24 hr capsule Take 1 capsule (75 mg total) by mouth daily. 30 capsule 0   pregabalin (LYRICA) 75 MG capsule Take 1 capsule (75 mg total) by mouth 3 (three) times daily for 7 days. 21 capsule 0   No facility-administered medications prior to visit.

## 2023-09-06 LAB — PULMONARY FUNCTION TEST ARMC ONLY
DL/VA % pred: 85 %
DL/VA: 3.48 ml/min/mmHg/L
DLCO unc % pred: 58 %
DLCO unc: 12.19 ml/min/mmHg
FEF 25-75 Post: 2.25 L/s
FEF 25-75 Pre: 1.01 L/s
FEF2575-%Change-Post: 121 %
FEF2575-%Pred-Post: 106 %
FEF2575-%Pred-Pre: 48 %
FEV1-%Change-Post: 27 %
FEV1-%Pred-Post: 63 %
FEV1-%Pred-Pre: 50 %
FEV1-Post: 1.61 L
FEV1-Pre: 1.27 L
FEV1FVC-%Change-Post: 11 %
FEV1FVC-%Pred-Pre: 96 %
FEV6-%Change-Post: 16 %
FEV6-%Pred-Post: 61 %
FEV6-%Pred-Pre: 52 %
FEV6-Post: 1.95 L
FEV6-Pre: 1.67 L
FEV6FVC-%Pred-Post: 104 %
FEV6FVC-%Pred-Pre: 104 %
FVC-%Change-Post: 13 %
FVC-%Pred-Post: 58 %
FVC-%Pred-Pre: 51 %
FVC-Post: 1.95 L
FVC-Pre: 1.71 L
Post FEV1/FVC ratio: 83 %
Post FEV6/FVC ratio: 100 %
Pre FEV1/FVC ratio: 74 %
Pre FEV6/FVC Ratio: 100 %
RV % pred: 66 %
RV: 1.49 L
TLC % pred: 69 %
TLC: 3.74 L

## 2023-09-08 ENCOUNTER — Other Ambulatory Visit: Payer: Self-pay | Admitting: Family Medicine

## 2023-09-08 DIAGNOSIS — N6489 Other specified disorders of breast: Secondary | ICD-10-CM

## 2023-09-11 ENCOUNTER — Ambulatory Visit: Payer: Medicare Other | Admitting: Urology

## 2023-09-11 VITALS — BP 173/64 | HR 128

## 2023-09-11 DIAGNOSIS — R32 Unspecified urinary incontinence: Secondary | ICD-10-CM

## 2023-09-11 DIAGNOSIS — N3946 Mixed incontinence: Secondary | ICD-10-CM

## 2023-09-11 LAB — URINALYSIS, COMPLETE
Bilirubin, UA: NEGATIVE
Glucose, UA: NEGATIVE
Ketones, UA: NEGATIVE
Leukocytes,UA: NEGATIVE
Nitrite, UA: POSITIVE — AB
Specific Gravity, UA: >1.03 — ABNORMAL HIGH (ref 1.005–1.030)
Urobilinogen, Ur: 0.2 mg/dL (ref 0.2–1.0)
pH, UA: 5.5 (ref 5.0–7.5)

## 2023-09-11 LAB — BLADDER SCAN AMB NON-IMAGING: Scan Result: 0

## 2023-09-11 LAB — MICROSCOPIC EXAMINATION: Epithelial Cells (non renal): >10 /[HPF] — AB (ref 0–10)

## 2023-09-11 MED ORDER — SOLIFENACIN SUCCINATE 5 MG PO TABS: 5.0000 mg | ORAL_TABLET | Freq: Every day | ORAL | 3 refills | Status: DC

## 2023-09-11 NOTE — Progress Notes (Signed)
09/11/2023 8:38 AM   Kristi Lee July 07, 1956 [id number]  Referring provider: Shane Crutch, PA [address] [place],  [place] [address]  Chief Complaint  Patient presents with   Cysto    HPI: I was consulted to assess the patient is urinary incontinence worsening in the last 3 months.  She primarily has urge incontinence with some early morning foot on the floor syndrome.  I think she can leak when she goes from sitting to standing position.  She does leak with coughing sneezing as well.  She is moderately better on oxybutynin ER 10 mg.  No bedwetting   She wears 3 pads a day that are sometimes soaked.  She voids every hour but probably could hold it for 2 hours.  She gets up once at night.  Flow was reasonable   She has had 3 back surgery for scoliosis.  She has had a hysterectomy.    Patient has mixed incontinence but primarily urge incontinence over the last few months. Stop oxybutynin. Get urine culture and residual next visit. Return on Gemtesa samples and prescription for cystoscopy and pelvic examination. I mention urodynamics without specifics but in my opinion she does not need them at this point   Today Frequency stable.  Postvoid residual today 0 mL. Leslye Peer helped but was not covered by insurance.  She is now on Solifenacin and is completely continent and very pleased. On pelvic examination she had a small grade 2 cystocele with some rotational descent.  No stress incontinence.  She had mild meatal stenosis and I was unable to perform cystoscopy and did not dilate her    PMH: Past Medical History:  Diagnosis Date   Arthritis    back   CHF (congestive heart failure) (HCC)    Depression    DVT (deep venous thrombosis) (HCC) 01/25/2020   GERD (gastroesophageal reflux disease)    Heart murmur    asymptomatic   Hypertension    PONV (postoperative nausea and vomiting)    Scoliosis    reports she has rods in her back    Surgical History: Past Surgical  History:  Procedure Laterality Date   ABDOMINAL HYSTERECTOMY     BACK SURGERY  10/2000   for scoliosis   BREAST EXCISIONAL BIOPSY Right 1990's   NEG   BROW LIFT Bilateral 07/23/2020   Procedure: BLEPHAROPLASTY UPPER EYELID; W/EXCESS SKIN BLEPHAROPTOSIS REPAIR; RESECT EX BILATERAL;  Surgeon: Imagene Riches, MD;  Location: [facility];  Service: Ophthalmology;  Laterality: Bilateral;   COLONOSCOPY WITH PROPOFOL N/A 03/23/2020   Procedure: COLONOSCOPY WITH PROPOFOL;  Surgeon: Midge Minium, MD;  Location: [facility];  Service: Endoscopy;  Laterality: N/A;  priority 4   FEMUR FRACTURE SURGERY Right 07/15/2009   Pertrochanteric femur fracture.   HIP FRACTURE SURGERY Right    PERIPHERAL VASCULAR THROMBECTOMY Left 11/11/2019   Procedure: PERIPHERAL VASCULAR THROMBECTOMY;  Surgeon: Annice Needy, MD;  Location: [facility] INVASIVE CV LAB;  Service: Cardiovascular;  Laterality: Left;   POLYPECTOMY  03/23/2020   Procedure: POLYPECTOMY;  Surgeon: Midge Minium, MD;  Location: [facility];  Service: Endoscopy;;   REVERSE SHOULDER ARTHROPLASTY Left 07/30/2021   Procedure: Left reverse shoulder arthroplasty, biceps tenodesis;  Surgeon: Signa Kell, MD;  Location: [facility] ORS;  Service: Orthopedics;  Laterality: Left;   REVISION TOTAL HIP ARTHROPLASTY Left     Home Medications:  Allergies as of 09/11/2023   No Known Allergies      Medication List  Accurate as of September 11, 2023  8:38 AM. If you have any questions, ask your nurse or doctor.          Breztri Aerosphere 160-9-4.8 MCG/ACT Aero Generic drug: Budeson-Glycopyrrol-Formoterol Inhale 2 puffs into the lungs in the morning and at bedtime.   docusate sodium 100 MG capsule Commonly known as: COLACE Take 1 capsule (100 mg total) by mouth daily.   hydrocortisone cream 1 % Apply to affected area 2 times daily   Klor-Con M20 20 MEQ tablet Generic drug: potassium chloride SA Take 20 mEq by mouth daily.    metoprolol succinate 25 MG 24 hr tablet Commonly known as: TOPROL-XL Take 1 tablet (25 mg total) by mouth daily.   omeprazole 20 MG capsule Commonly known as: PRILOSEC Take 1 capsule (20 mg total) by mouth every other day.   oxycodone 5 MG capsule Commonly known as: OXY-IR Take 2.5 mg by mouth every 6 (six) hours as needed. Give one tablet by mouth every 6 hours as needed   polyethylene glycol 17 g packet Commonly known as: MIRALAX / GLYCOLAX Take 17 g by mouth daily.   pregabalin 75 MG capsule Commonly known as: LYRICA Take 1 capsule (75 mg total) by mouth 3 (three) times daily for 7 days.   rivaroxaban 20 MG Tabs tablet Commonly known as: XARELTO Take 1 tablet (20 mg total) by mouth daily with supper.   solifenacin 5 MG tablet Commonly known as: VESICARE Take 1 tablet (5 mg total) by mouth daily.   spironolactone 25 MG tablet Commonly known as: ALDACTONE Take 1 tablet (25 mg total) by mouth daily.   traZODone 100 MG tablet Commonly known as: DESYREL Take 2 tablets (200 mg total) by mouth at bedtime.   triamterene-hydrochlorothiazide 37.5-25 MG tablet Commonly known as: MAXZIDE-25 Take 1 tablet by mouth daily.   venlafaxine XR 75 MG 24 hr capsule Commonly known as: EFFEXOR-XR Take 1 capsule (75 mg total) by mouth daily.        Allergies: No Known Allergies  Family History: Family History  Problem Relation Age of Onset   Osteoarthritis Mother    Lung cancer Father    Diabetes Brother    Breast cancer Neg Hx     Social History:  reports that she has never smoked. She has never used smokeless tobacco. She reports that she does not drink alcohol and does not use drugs.  ROS:                                        Physical Exam: BP (!) 173/64   Pulse (!) 128   Constitutional:  Alert and oriented, No acute distress. HEENT:  AT, moist mucus membranes.  Trachea midline, no masses.   Laboratory Data: Lab Results  Component  Value Date   WBC 10.2 08/04/2023   HGB 11.8 (L) 08/04/2023   HCT 38.6 08/04/2023   MCV 83.2 08/04/2023   PLT 278 08/04/2023    Lab Results  Component Value Date   CREATININE 0.76 08/04/2023    No results found for: "PSA"  No results found for: "TESTOSTERONE"  Lab Results  Component Value Date   HGBA1C 6.3 (H) 04/03/2023    Urinalysis    Component Value Date/Time   COLORURINE YELLOW (A) 04/03/2023 2134   APPEARANCEUR CLOUDY (A) 04/03/2023 2134   APPEARANCEUR Cloudy 10/25/2011 1308   LABSPEC 1.016 04/03/2023 2134  LABSPEC 1.015 10/25/2011 1308   PHURINE 5.0 04/03/2023 2134   GLUCOSEU NEGATIVE 04/03/2023 2134   GLUCOSEU Negative 10/25/2011 1308   HGBUR NEGATIVE 04/03/2023 2134   BILIRUBINUR NEGATIVE 04/03/2023 2134   BILIRUBINUR neg 10/06/2015 1521   BILIRUBINUR Negative 10/25/2011 1308   KETONESUR NEGATIVE 04/03/2023 2134   PROTEINUR 30 (A) 04/03/2023 2134   UROBILINOGEN 0.2 10/06/2015 1521   NITRITE NEGATIVE 04/03/2023 2134   LEUKOCYTESUR LARGE (A) 04/03/2023 2134   LEUKOCYTESUR Negative 10/25/2011 1308    Pertinent Imaging: Urinalysis is negative for blood and urine sent for culture  Assessment & Plan: Reassess durability of Solifenacin in 4 months 1. Urinary incontinence, unspecified type (Primary)  - Urinalysis, Complete  2. Mixed incontinence  - Urinalysis, Complete   No follow-ups on file.  Martina Sinner, MD  [facility] [address] [place], [place] [address] [phone]

## 2023-09-15 LAB — CULTURE, URINE COMPREHENSIVE

## 2023-09-18 ENCOUNTER — Ambulatory Visit
Admission: RE | Admit: 2023-09-18 | Discharge: 2023-09-18 | Disposition: A | Discharge status: Home / Self Care | Payer: No Typology Code available for payment source | Source: Ambulatory Visit | Attending: Family Medicine | Admitting: Family Medicine

## 2023-09-18 ENCOUNTER — Other Ambulatory Visit: Payer: Self-pay | Admitting: Family Medicine

## 2023-09-18 DIAGNOSIS — N6489 Other specified disorders of breast: Secondary | ICD-10-CM

## 2023-09-18 DIAGNOSIS — R92323 Mammographic fibroglandular density, bilateral breasts: Secondary | ICD-10-CM | POA: Insufficient documentation

## 2023-09-18 DIAGNOSIS — Z1239 Encounter for other screening for malignant neoplasm of breast: Secondary | ICD-10-CM | POA: Diagnosis present

## 2023-09-18 MED ORDER — LIDOCAINE HCL 1 % IJ SOLN
5.0000 mL | Freq: Once | INTRAMUSCULAR | Status: AC
  Administered 2023-09-18: 5 mL
  Filled 2023-09-18: qty 5

## 2023-09-20 ENCOUNTER — Other Ambulatory Visit: Payer: Medicare Other

## 2023-09-22 ENCOUNTER — Other Ambulatory Visit: Payer: Self-pay | Admitting: Family Medicine

## 2023-09-22 DIAGNOSIS — N6489 Other specified disorders of breast: Secondary | ICD-10-CM

## 2023-11-07 ENCOUNTER — Encounter (INDEPENDENT_AMBULATORY_CARE_PROVIDER_SITE_OTHER): Payer: Self-pay | Admitting: Vascular Surgery

## 2023-11-07 ENCOUNTER — Ambulatory Visit (INDEPENDENT_AMBULATORY_CARE_PROVIDER_SITE_OTHER): Payer: Medicare Other | Admitting: Vascular Surgery

## 2023-11-07 VITALS — BP 149/80 | HR 108 | Resp 18 | Ht 66.0 in | Wt 178.4 lb

## 2023-11-07 DIAGNOSIS — D6851 Activated protein C resistance: Secondary | ICD-10-CM | POA: Diagnosis not present

## 2023-11-07 DIAGNOSIS — I10 Essential (primary) hypertension: Secondary | ICD-10-CM | POA: Diagnosis not present

## 2023-11-07 DIAGNOSIS — I825Y2 Chronic embolism and thrombosis of unspecified deep veins of left proximal lower extremity: Secondary | ICD-10-CM | POA: Diagnosis not present

## 2023-11-07 DIAGNOSIS — E782 Mixed hyperlipidemia: Secondary | ICD-10-CM

## 2023-11-07 NOTE — Assessment & Plan Note (Signed)
 No significant postphlebitic symptoms currently.  Has factor V Leiden remaining on indefinite anticoagulation.  Follow-up in 1 year.
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
## 2023-12-19 ENCOUNTER — Ambulatory Visit
Admission: RE | Admit: 2023-12-19 | Discharge: 2023-12-19 | Disposition: A | Discharge status: Home / Self Care | Source: Ambulatory Visit | Attending: Family Medicine | Admitting: Family Medicine

## 2023-12-19 DIAGNOSIS — N6489 Other specified disorders of breast: Secondary | ICD-10-CM

## 2023-12-21 ENCOUNTER — Ambulatory Visit: Payer: Medicare Other | Admitting: Student in an Organized Health Care Education/Training Program

## 2024-01-02 ENCOUNTER — Encounter (INDEPENDENT_AMBULATORY_CARE_PROVIDER_SITE_OTHER): Payer: Self-pay

## 2024-01-15 ENCOUNTER — Ambulatory Visit (INDEPENDENT_AMBULATORY_CARE_PROVIDER_SITE_OTHER): Payer: Self-pay | Admitting: Urology

## 2024-01-15 VITALS — BP 147/72 | HR 120 | Ht 66.0 in | Wt 178.0 lb

## 2024-01-15 DIAGNOSIS — N3946 Mixed incontinence: Secondary | ICD-10-CM

## 2024-01-15 LAB — URINALYSIS, COMPLETE
Bilirubin, UA: NEGATIVE
Glucose, UA: NEGATIVE
Ketones, UA: NEGATIVE
Nitrite, UA: POSITIVE — AB
RBC, UA: NEGATIVE
Specific Gravity, UA: 1.025 (ref 1.005–1.030)
Urobilinogen, Ur: 1 mg/dL (ref 0.2–1.0)
pH, UA: 6 (ref 5.0–7.5)

## 2024-01-15 LAB — MICROSCOPIC EXAMINATION: Epithelial Cells (non renal): >10 /HPF — AB (ref 0–10)

## 2024-01-15 MED ORDER — SOLIFENACIN SUCCINATE 5 MG PO TABS: 5.0000 mg | ORAL_TABLET | Freq: Every day | ORAL | 3 refills | Status: DC

## 2024-01-15 NOTE — Progress Notes (Signed)
 01/15/2024 9:00 AM   Kristi Lee [birth year] [id number]  Referring provider: Aneita Baptise, PA [address] [place],  [place] [address]  Chief Complaint  Patient presents with   Urinary Incontinence    HPI: I was consulted to assess the patient is urinary incontinence worsening in the last 3 months.  She primarily has urge incontinence with some early morning foot on the floor syndrome.  I think she can leak when she goes from sitting to standing position.  She does leak with coughing sneezing as well.  She is moderately better on oxybutynin  ER 10 mg.  No bedwetting   She wears 3 pads a day that are sometimes soaked.  She voids every hour but probably could hold it for 2 hours.  She gets up once at night.  Flow was reasonable   She has had 3 back surgery for scoliosis.  She has had a hysterectomy.     Patient has mixed incontinence but primarily urge incontinence over the last few months. Stop oxybutynin . Get urine culture and residual next visit. Return on Gemtesa  samples and prescription for cystoscopy and pelvic examination. I mention urodynamics without specifics but in my opinion she does not need them at this point    Today Frequency stable.  Postvoid residual today 0 mL. Gemtesa  helped but was not covered by insurance.  She is now on Solifenacin  and is completely continent and very pleased. On pelvic examination she had a small grade 2 cystocele with some rotational descent.  No stress incontinence.  She had mild meatal stenosis and I was unable to perform cystoscopy and did not dilate her    Reassess in 4 months on solifenacin   Today Urgency incontinence dramatically better.  Frequency improved.  No infections   PMH: Past Medical History:  Diagnosis Date   Arthritis    back   CHF (congestive heart failure) (HCC)    Depression    DVT (deep venous thrombosis) (HCC) 01/25/2020   GERD (gastroesophageal reflux disease)    Heart murmur    asymptomatic    Hypertension    PONV (postoperative nausea and vomiting)    Scoliosis    reports she has rods in her back    Surgical History: Past Surgical History:  Procedure Laterality Date   ABDOMINAL HYSTERECTOMY     BACK SURGERY  10/2000   for scoliosis   BREAST CYST ASPIRATION Right    BREAST EXCISIONAL BIOPSY Right 1990's   NEG   BROW LIFT Bilateral 07/23/2020   Procedure: BLEPHAROPLASTY UPPER EYELID; W/EXCESS SKIN BLEPHAROPTOSIS REPAIR; RESECT EX BILATERAL;  Surgeon: Zacarias Hermann, MD;  Location: [facility];  Service: Ophthalmology;  Laterality: Bilateral;   COLONOSCOPY WITH PROPOFOL  N/A 03/23/2020   Procedure: COLONOSCOPY WITH PROPOFOL ;  Surgeon: Marnee Sink, MD;  Location: [facility];  Service: Endoscopy;  Laterality: N/A;  priority 4   FEMUR FRACTURE SURGERY Right 07/15/2009   Pertrochanteric femur fracture.   HIP FRACTURE SURGERY Right    PERIPHERAL VASCULAR THROMBECTOMY Left 11/11/2019   Procedure: PERIPHERAL VASCULAR THROMBECTOMY;  Surgeon: Celso College, MD;  Location: [facility] CV [facility];  Service: Cardiovascular;  Laterality: Left;   POLYPECTOMY  03/23/2020   Procedure: POLYPECTOMY;  Surgeon: Marnee Sink, MD;  Location: [facility];  Service: Endoscopy;;   REVERSE SHOULDER ARTHROPLASTY Left 07/30/2021   Procedure: Left reverse shoulder arthroplasty, biceps tenodesis;  Surgeon: Lorri Rota, MD;  Location: [facility];  Service: Orthopedics;  Laterality: Left;   REVISION TOTAL  HIP ARTHROPLASTY Left     Home Medications:  Allergies as of 01/15/2024   No Known Allergies      Medication List        Accurate as of January 15, 2024  9:00 AM. If you have any questions, ask your nurse or doctor.          STOP taking these medications    Breztri  Aerosphere 160-9-4.8 MCG/ACT Aero inhaler Generic drug: budesonide -glycopyrrolate-formoterol    docusate sodium  100 MG capsule Commonly known as: COLACE   oxycodone  5 MG capsule Commonly known as: OXY-IR    polyethylene glycol 17 g packet Commonly known as: MIRALAX  / GLYCOLAX    pregabalin  75 MG capsule Commonly known as: LYRICA        TAKE these medications    betamethasone dipropionate 0.05 % ointment Commonly known as: DIPROLENE Apply 0.05 mg topically as needed.   hydrocortisone  cream 1 % Apply to affected area 2 times daily   Klor-Con  M20 20 MEQ tablet Generic drug: potassium chloride  SA Take 20 mEq by mouth daily.   metoprolol  succinate 25 MG 24 hr tablet Commonly known as: TOPROL -XL Take 1 tablet (25 mg total) by mouth daily.   nortriptyline 10 MG capsule Commonly known as: PAMELOR Take 10 mg by mouth 2 (two) times daily.   omeprazole  20 MG capsule Commonly known as: PRILOSEC Take 1 capsule (20 mg total) by mouth every other day.   rivaroxaban  20 MG Tabs tablet Commonly known as: XARELTO  Take 1 tablet (20 mg total) by mouth daily with supper.   solifenacin  5 MG tablet Commonly known as: VESICARE  Take 1 tablet (5 mg total) by mouth daily.   spironolactone  25 MG tablet Commonly known as: ALDACTONE  Take 1 tablet (25 mg total) by mouth daily.   traZODone  100 MG tablet Commonly known as: DESYREL  Take 2 tablets (200 mg total) by mouth at bedtime.   triamterene -hydrochlorothiazide  37.5-25 MG tablet Commonly known as: MAXZIDE -25 Take 1 tablet by mouth daily.   venlafaxine  XR 150 MG 24 hr capsule Commonly known as: EFFEXOR -XR Take 150 mg by mouth daily with breakfast.        Allergies: No Known Allergies  Family History: Family History  Problem Relation Age of Onset   Osteoarthritis Mother    Lung cancer Father    Diabetes Brother    Breast cancer Neg Hx     Social History:  reports that she has never smoked. She has never used smokeless tobacco. She reports that she does not drink alcohol and does not use drugs.  ROS:                                        Physical Exam: There were no vitals taken for this visit.   Constitutional:  Alert and oriented, No acute distress. HEENT: [place] AT, moist mucus membranes.  Trachea midline, no masses.  Laboratory Data: Lab Results  Component Value Date   WBC 10.2 08/04/2023   HGB 11.8 (L) 08/04/2023   HCT 38.6 08/04/2023   MCV 83.2 08/04/2023   PLT 278 08/04/2023    Lab Results  Component Value Date   CREATININE 0.76 08/04/2023    No results found for: "PSA"  No results found for: "TESTOSTERONE"  Lab Results  Component Value Date   HGBA1C 6.3 (H) 04/03/2023    Urinalysis    Component Value Date/Time   COLORURINE YELLOW (A) 04/03/2023 2134  APPEARANCEUR Hazy (A) 09/11/2023 0832   LABSPEC 1.016 04/03/2023 2134   LABSPEC 1.015 10/25/2011 1308   PHURINE 5.0 04/03/2023 2134   GLUCOSEU Negative 09/11/2023 0832   GLUCOSEU Negative 10/25/2011 1308   HGBUR NEGATIVE 04/03/2023 2134   BILIRUBINUR Negative 09/11/2023 0832   BILIRUBINUR Negative 10/25/2011 1308   KETONESUR NEGATIVE 04/03/2023 2134   PROTEINUR 1+ (A) 09/11/2023 0832   PROTEINUR 30 (A) 04/03/2023 2134   UROBILINOGEN 0.2 10/06/2015 1521   NITRITE Positive (A) 09/11/2023 0832   NITRITE NEGATIVE 04/03/2023 2134   LEUKOCYTESUR Negative 09/11/2023 0832   LEUKOCYTESUR LARGE (A) 04/03/2023 2134   LEUKOCYTESUR Negative 10/25/2011 1308    Pertinent Imaging:   Assessment & Plan: Renewed Vesicare  90 x 3 and see in 1 year  1. Mixed incontinence (Primary)  - Urinalysis, Complete   No follow-ups on file.  Devorah Fonder, MD  [facility] [address], [address] [place], [place] [address] [phone]

## 2024-01-15 NOTE — Addendum Note (Signed)
 Addended by: Natha Bair on: 01/15/2024 09:44 AM   Modules accepted: Orders

## 2024-01-17 LAB — CULTURE, URINE COMPREHENSIVE

## 2024-01-22 ENCOUNTER — Ambulatory Visit: Payer: Self-pay

## 2024-01-22 DIAGNOSIS — N3946 Mixed incontinence: Secondary | ICD-10-CM

## 2024-01-22 NOTE — Telephone Encounter (Signed)
 Patient returned call. She will be available this afternoon to call her back.

## 2024-01-23 MED ORDER — NITROFURANTOIN MACROCRYSTAL 100 MG PO CAPS: 100.0000 mg | ORAL_CAPSULE | Freq: Two times a day (BID) | ORAL | 0 refills | Status: DC

## 2024-01-23 MED ORDER — NITROFURANTOIN MONOHYD MACRO 100 MG PO CAPS
100.0000 mg | ORAL_CAPSULE | Freq: Two times a day (BID) | ORAL | 0 refills | Status: AC
Start: 2024-01-23 — End: 2024-01-30

## 2024-07-29 ENCOUNTER — Telehealth: Payer: Self-pay | Admitting: Urology

## 2024-07-29 NOTE — Telephone Encounter (Signed)
 Pt called to let us  know Vesicare  was working well, but just now all of a sudden it has quit working.  She is going to the bathroom about every 1/2 hour.  She wants to know if Dr Gaston would like for her to try something different for urgency/frequency.

## 2024-08-02 MED ORDER — MIRABEGRON ER 50 MG PO TB24: 50.0000 mg | ORAL_TABLET | Freq: Every day | ORAL | 11 refills | Status: DC

## 2024-08-02 NOTE — Addendum Note (Signed)
 Addended byBETHA CORIE PLATER on: 08/02/2024 08:54 AM   Modules accepted: Orders

## 2024-08-02 NOTE — Telephone Encounter (Signed)
 Patient was informed that a prescription for Myrbetriq has been sent to her pharmacy due to Vesicare  suddenly stop working. Pt was instructed to notify our office if the medication does not improve their urinary symptoms so we can discuss next steps. Patient voiced understanding.

## 2024-08-16 ENCOUNTER — Encounter: Payer: Self-pay | Admitting: Orthopedic Surgery

## 2024-08-16 ENCOUNTER — Other Ambulatory Visit: Payer: Self-pay | Admitting: Orthopedic Surgery

## 2024-08-16 DIAGNOSIS — T8484XA Pain due to internal orthopedic prosthetic devices, implants and grafts, initial encounter: Secondary | ICD-10-CM

## 2024-08-19 ENCOUNTER — Other Ambulatory Visit
Admission: RE | Admit: 2024-08-19 | Discharge: 2024-08-19 | Disposition: A | Discharge status: Home / Self Care | Source: Ambulatory Visit | Attending: Sports Medicine | Admitting: Sports Medicine

## 2024-08-19 DIAGNOSIS — Z96649 Presence of unspecified artificial hip joint: Secondary | ICD-10-CM | POA: Insufficient documentation

## 2024-08-19 DIAGNOSIS — T8484XD Pain due to internal orthopedic prosthetic devices, implants and grafts, subsequent encounter: Secondary | ICD-10-CM | POA: Diagnosis present

## 2024-08-19 LAB — SYNOVIAL CELL COUNT + DIFF, W/ CRYSTALS
Crystals, Fluid: NONE SEEN
Eosinophils-Synovial: 0 %
Lymphocytes-Synovial Fld: 2 %
Monocyte-Macrophage-Synovial Fluid: 0 %
Neutrophil, Synovial: 98 %
WBC, Synovial: 43254 /mm3 — ABNORMAL HIGH (ref 0–200)

## 2024-08-21 ENCOUNTER — Other Ambulatory Visit

## 2024-08-30 ENCOUNTER — Other Ambulatory Visit: Payer: Self-pay | Admitting: Family Medicine

## 2024-08-30 DIAGNOSIS — Z1231 Encounter for screening mammogram for malignant neoplasm of breast: Secondary | ICD-10-CM

## 2024-09-04 ENCOUNTER — Encounter: Payer: Self-pay | Admitting: Urology

## 2024-09-04 DIAGNOSIS — R32 Unspecified urinary incontinence: Secondary | ICD-10-CM

## 2024-09-04 DIAGNOSIS — N3946 Mixed incontinence: Secondary | ICD-10-CM

## 2024-09-06 ENCOUNTER — Telehealth: Payer: Self-pay | Admitting: Urology

## 2024-09-06 NOTE — Telephone Encounter (Signed)
 Patient contacted office and said that Myrbetriq  that Dr. MacDiarmid prescribed her is not working. She is still going to the restroom ever hour and sometimes she can not make it. She would like something else called into [facility] pharmacy in [place]?

## 2024-09-10 MED ORDER — SOLIFENACIN SUCCINATE 10 MG PO TABS: 10.0000 mg | ORAL_TABLET | Freq: Every day | ORAL | 11 refills | Status: AC

## 2024-09-10 NOTE — Addendum Note (Signed)
 Addended byBETHA CORIE PLATER on: 09/10/2024 03:15 PM   Modules accepted: Orders

## 2024-09-23 ENCOUNTER — Encounter

## 2024-11-05 ENCOUNTER — Ambulatory Visit (INDEPENDENT_AMBULATORY_CARE_PROVIDER_SITE_OTHER): Admitting: Vascular Surgery

## 2024-12-30 ENCOUNTER — Ambulatory Visit: Admitting: Urology
